# Patient Record
Sex: Female | Born: 1955 | Race: White | Hispanic: No | Marital: Single | Smoking: Never smoker
Health system: Southern US, Community
[De-identification: ages and names within clinical notes are randomized; demographics above are authoritative.]

## PROBLEM LIST (undated history)

## (undated) DIAGNOSIS — J45909 Unspecified asthma, uncomplicated: Secondary | ICD-10-CM

## (undated) DIAGNOSIS — C9590 Leukemia, unspecified not having achieved remission: Secondary | ICD-10-CM

## (undated) DIAGNOSIS — R112 Nausea with vomiting, unspecified: Secondary | ICD-10-CM

## (undated) DIAGNOSIS — T7840XA Allergy, unspecified, initial encounter: Secondary | ICD-10-CM

## (undated) DIAGNOSIS — Z5189 Encounter for other specified aftercare: Secondary | ICD-10-CM

## (undated) DIAGNOSIS — C9200 Acute myeloblastic leukemia, not having achieved remission: Secondary | ICD-10-CM

## (undated) DIAGNOSIS — T8859XA Other complications of anesthesia, initial encounter: Secondary | ICD-10-CM

## (undated) DIAGNOSIS — Z9889 Other specified postprocedural states: Secondary | ICD-10-CM

## (undated) DIAGNOSIS — C801 Malignant (primary) neoplasm, unspecified: Secondary | ICD-10-CM

## (undated) HISTORY — PX: HAND SURGERY: SHX662

## (undated) HISTORY — DX: Encounter for other specified aftercare: Z51.89

## (undated) HISTORY — DX: Malignant (primary) neoplasm, unspecified: C80.1

## (undated) HISTORY — DX: Unspecified asthma, uncomplicated: J45.909

## (undated) HISTORY — DX: Allergy, unspecified, initial encounter: T78.40XA

## (undated) HISTORY — DX: Acute myeloblastic leukemia, not having achieved remission: C92.00

## (undated) HISTORY — PX: OTHER SURGICAL HISTORY: SHX169

---

## 2001-09-24 ENCOUNTER — Emergency Department: Admit: 2001-09-24 | Payer: Self-pay | Source: Emergency Department | Admitting: Emergency Medicine

## 2004-01-17 ENCOUNTER — Ambulatory Visit
Admit: 2004-01-17 | Disposition: A | Discharge status: Home / Self Care | Payer: Self-pay | Source: Ambulatory Visit | Admitting: Obstetrics & Gynecology

## 2004-12-04 ENCOUNTER — Emergency Department: Admit: 2004-12-04 | Payer: Self-pay | Source: Emergency Department | Admitting: Emergency Medicine

## 2005-01-14 HISTORY — PX: TENDON REPAIR: SHX5111

## 2005-08-28 ENCOUNTER — Ambulatory Visit: Admission: RE | Admit: 2005-08-28 | Payer: Self-pay | Source: Ambulatory Visit

## 2005-10-28 ENCOUNTER — Ambulatory Visit: Admit: 2005-10-28 | Disposition: A | Discharge status: Home / Self Care | Payer: Self-pay | Source: Ambulatory Visit

## 2006-03-31 ENCOUNTER — Emergency Department: Admit: 2006-03-31 | Payer: Self-pay | Source: Emergency Department | Admitting: Emergency Medicine

## 2006-05-22 ENCOUNTER — Ambulatory Visit
Admit: 2006-05-22 | Disposition: A | Discharge status: Home / Self Care | Payer: Self-pay | Source: Ambulatory Visit | Admitting: Orthopaedic Surgery

## 2006-07-01 ENCOUNTER — Ambulatory Visit
Admit: 2006-07-01 | Disposition: A | Discharge status: Home / Self Care | Payer: Self-pay | Source: Ambulatory Visit | Admitting: Specialist

## 2006-07-01 LAB — CBC- CERNER
Hematocrit: 36.3 % — ABNORMAL LOW (ref 37.0–47.0)
Hgb: 12.2 G/DL (ref 12.0–16.0)
MCH: 30.8 PG (ref 28.0–32.0)
MCHC: 33.5 G/DL (ref 32.0–36.0)
MCV: 91.8 FL (ref 80.0–100.0)
MPV: 10 FL (ref 7.4–10.4)
Platelets: 163 /mm3 (ref 140–400)
RBC: 3.96 /mm3 — ABNORMAL LOW (ref 4.20–5.40)
RDW: 12.9 % (ref 11.5–15.0)
WBC: 4.9 /mm3 (ref 3.5–10.8)

## 2006-07-01 LAB — HEPATITIS B SURFACE ANTIGEN W/ REFLEX TO CONFIRMATION: Hepatitis B Surface Antigen: NEGATIVE

## 2006-07-01 LAB — URIC ACID: Uric acid: 3.4 mg/dL (ref 2.4–5.7)

## 2006-07-01 LAB — SEDIMENTATION RATE: Sed Rate: 10 MM/HR (ref 0–20)

## 2006-07-01 LAB — HEPATITIS B SURFACE ANTIBODY: HBSAB QN: 0 mlU/mL — AB

## 2006-07-01 LAB — HEMOLYSIS INDEX: Hemolysis Index: 1 Units

## 2006-07-01 LAB — CREATININE, SERUM: Creatinine: 0.8 mg/dL (ref 0.4–1.1)

## 2006-07-01 LAB — LIVER FUNCTION PANEL - AH CERNER
ALT: 24 U/L (ref 0–31)
AST (SGOT): 22 U/L (ref 0–31)
Albumin/Globulin Ratio: 2 (ref 1.1–2.2)
Albumin: 4.6 g/dL (ref 3.4–4.8)
Alkaline Phosphatase: 86 U/L (ref 35–104)
Bilirubin Direct: 0.1 mg/dL (ref 0.0–0.3)
Bilirubin Indirect: 0.1 mg/dL (ref 0.0–0.7)
Bilirubin, Total: 0.2 mg/dL (ref 0.0–1.0)
Globulin: 2.3 g/dL (ref 2.0–3.6)
Protein, Total: 6.9 g/dL (ref 6.4–8.3)

## 2006-07-01 LAB — GFR

## 2006-07-01 LAB — HEPATITIS C ANTIBODY: Hepatitis C, AB: NEGATIVE

## 2006-07-02 LAB — CYCLIC CITRULLINATED PEPTIDE AB (SOFT)

## 2006-07-02 LAB — HEPATITIS B CORE ANTIBODY, TOTAL

## 2006-07-02 LAB — LYME DISEASE SEROLOGY, S-SOFT

## 2006-07-03 LAB — ANA SCREEN ONLY: ANA Qualitative: NEGATIVE U/ml

## 2007-05-28 ENCOUNTER — Ambulatory Visit
Admit: 2007-05-28 | Disposition: A | Discharge status: Home / Self Care | Payer: Self-pay | Source: Ambulatory Visit | Admitting: Specialist

## 2007-05-29 LAB — ANA SCREEN ONLY: ANA Qualitative: NEGATIVE U/ml

## 2007-05-30 LAB — CYCLIC CITRULLINATED PEPTIDE AB (SOFT)

## 2010-01-25 ENCOUNTER — Emergency Department: Admit: 2010-01-25 | Payer: Self-pay | Source: Emergency Department | Admitting: Emergency Medicine

## 2010-11-01 NOTE — Op Note (Unsigned)
[name], [name]      MEDICAL RECORD NUMBER:         [id number]      PATIENT LOCATION/SERVICE:       [id number]            DATE OF SURGERY:               08/28/2005      SURGEON:                       Meribeth Mattes, MD      ASSISTANT 1:            PREOPERATIVE DIAGNOSES:      1.    Extensor tenosynovitis/tenosynovial cyst dorsum hand left.      2.    Pain in hand.            POSTOPERATIVE DIAGNOSES:      1.    Tenosynovitis and tenosynovial cyst.      2.    Rupture of the common extensor tendon to the index finger dorsum hand      left.            ASSISTANT:  Derrick Ravel, M.D.            PROCEDURE PERFORMED:      1.    Extensor tenosynovectomy, radical, dorsum hand left.      2.    Tenorrhaphy index common extensor hand left with Pulvertaft weave of      the third common extensor.            ANESTHESIA:  General.            COMPLICATIONS:  None.  Tolerated well.            DRAINS:  None.            DESCRIPTION OF PROCEDURE:  On 08/28/05 after appropriate informed consent,      the patient was taken from the anesthesia holding area to the operating      room where she underwent satisfactory induction of general  LMA anesthesia.      Following this, the left upper extremity was prepped and draped in a      sterile manner.  The left upper extremity was exsanguinated with an Esmarch      wrap and the tourniquet elevated to 250 mm/Hg.            An initial incision was made midline dorsal and midline over the extent of      the mass which was 2 cm in width and extending to the fourth extensor      compartment and two-thirds of the way to the metacarpal head.  This was      taken down through skin and subcutaneous tissue to the level of the mass.      A dissection was then carried out preserving all superficial sensory      innervation as well as venous structures.  This was dissected peripherally      along its radial side and along the ulnar side, and this mass was basically      an  envelopment by this tenosynovitis of tendons of the fourth compartment      with some extension into the area of the second compartment mid dorsum of  the hand and did not involve the thumb EPL.  This tenosynovitis encircled      all tendons of the fourth compartment including the indicis proprius which      when dissected free was found to be a normal tendon.  The common extensor      tendon had been digested in half with remaining distal stump and apparent      complete retraction in the form of the tendon of the common index extensor.      The middle finger extensor was enveloped by this tenosynovial mass, and      that was dissected from that in its entirety. All of this dissection was      carried out under 4.5 loupe magnification, and all aspects of this      tenosynovitis were removed.  This was extended to the ring finger ray      tendons but did not envelop that.  There was no underlying communication to      joint. Hemostasis was obtained with electrocautery.  Copious irrigation was      carried out. It was felt that the stump of the ruptured common extensor to      the index could be treated by incorporating a tendon repair utilizing the      technique of Pulvertaft and braiding that tendon through the substance of      the middle finger common extensor tendon.  So this was done in standard      manner with three passes with all aspects of it sutured with nonabsorbable      Ethibond suture with the tip buried finally and smoothly proximally with      the last Ethibond suture.  This having been done and with copious      irrigation, the wound was closed with interrupted 4-0 Vicryl in the SQ      layer, interrupted 5-0 Nylon on the skin layer.  A sterile dressing with      Xeroform and Betadine Ointment.  The subcutaneous space was injected with      Marcaine, Duramorph, and Kenalog.  The patient had a bulky hand dressing      applied with volar splint maintaining the MP joints in extension.  The       tourniquet was released and circulation returned. The patient was taken      from the operating room to the recovery room in satisfactory condition      having tolerated the procedure well.                  ___________________________________     Date Signed: _______________      Meribeth Mattes, MD                  CRU:amw:SC      D:    08/28/2005      T:    08/30/2005      #:    [id number]      N:    [id number]            cc:   Meribeth Mattes, MD

## 2011-11-11 ENCOUNTER — Inpatient Hospital Stay: Payer: PRIVATE HEALTH INSURANCE | Attending: Family Medicine | Admitting: Physical Therapist

## 2011-11-11 VITALS — BP 118/66 | HR 61

## 2011-11-11 DIAGNOSIS — M2240 Chondromalacia patellae, unspecified knee: Secondary | ICD-10-CM | POA: Insufficient documentation

## 2011-11-11 NOTE — PT/OT Exercise Plan (Signed)
Name: [name]  Referring Physician: Edwyna Shell, *  Diagnosis:   Encounter Diagnosis   Name Primary?   . Chondromalacia of patella Yes    ICD 9 Code:  717.7      Precautions:   Date of Surgery:   MD Follow-up: Late Nov          Exercise Flow Sheet    Exercise Specifics 11-11-11 Date              Bike               Incline                 Bosu                 Cybex   HS                Leg press                 TBand   [place]                 Core                                                                                     Home Exercise Program                 (Initials = supervised exercise by clinician)

## 2011-11-11 NOTE — PT/OT Therapy Note (Signed)
DAILY NOTE    Time In/Out: 1707 - 1808 Total Time: 60  Visit Number:  1/8    # of Authorized Visits:   Visit #:      Diagnosis (Treating/Medical): The encounter diagnosis was Chondromalacia of patella.      Subjective:  Kitty's pain is Intermittent and is rated a 0/10., at present  Functional Changes: has not changed any function in the past month.  She goes to gym 2-3 x per wk    Objective:   Treatment:  Therapeutic Exercise:Per flowsheet to improve: Flexibility/ROM, Stabilization and Strength    Warm-up   Modifications/Patient Education: eval today (Verbal cues )    Functional Activities: lunges for gym    INITIAL EVALUATION (Knee)      Name: [name] Age: 56 y.o. Occupation: Airline pilot, drives, carries carpet samples SOC: 11/11/2011   Referring Physician: Darnelle Bos  MD recheck: 3 weeks  DOS:  DOI: Onset of Problem / Injury: 03/24/11  Diagnosis (Treating/Medical): The encounter diagnosis was Chondromalacia of patella.   # of Authorized Visits:   Visit #   today     SUBJECTIVE:    Mechanism of Injury: Mechanism of Injury/Exacerbation: Insidious Onset    Pt has had problem on and off for 3 years.  Comes and goes.  Not specifically related to activity, but had notice that she was stiff/tight feeling after driving a lot.  Started on the L.    Patient's reason for seeking PT /: Patient Goal: learn what to do to control this and determine why it is there.    Past Medical History: No past medical history on file.  Medications: No outpatient prescriptions have been marked as taking for the 11/11/11 encounter (Office Visit) with Janace Hoard, PT.   Fall Risks: Low    Other Treatment/Prior Therapy: Yes had treatment on L lower leg and foot at a chiropractors office, with some benefit.  Dr. Tanda Rockers did " with no OA"  Prior Hospitalization: Prior Hospitalization From / To: Hx of R ankle fx 2008; L ankle fx 2011; L hand tendon rupture 2006  Hand Dominance: Dominant Hand: Right Involved Side: Involved Side:  Bilateral Tests:          Outcome Measure:      LE Functional Score: 65  % PSFS Score: 0 %    /10  PLOF: Prior level of function: Ambulated/ Performed ADL's independently  Living Environment:   # of Steps: 2 flights      Dwelling Entrance:     Patient lives with: Living Arrangements: Alone    OBJECTIVE:    Vitals: BP: 118/66 mmHg Heart Rate: 61       Observation/Posture/Gait/Integumentary:  Observation of posture: Within Functional Limitations  Ambulation: comment: Pt has no limp.  In gait the L knee has a varus shift at mid stance, and the R LE tends to IR  Gait: Other: see above    Girth/Edema: None noted   Initial R  Initial L   Suprapatellar 0     Mid Patellar      Infrapatellar      10 cm. Prox.      10 cm. Dist.            (blank fields were intentionally left blank)    Integumentary: Other: NA  Palpation: No pain to palpation and Other: Pt has a slight swollen feeling to both popliteal spaces, but not painful with palp or overpressure.   Patellar Mobility: hypermobile Direction:  lateral    Range of Motion: (degrees)    Initial Right  AROM InitialRight  PROM   Right  AROM   Right PROM Knee InitialLeft AROM InitialLeft PROM   Left AROM   Left PROM   145    Flexion 145      +2   +8 Extension +2   +2   (blank fields were intentionally left blank)    Hip AROM: WFL  IR/ER  R  =40/35;  L 30/35  Ankle AROM: WFL and Other: in squatting pt has decreased excursion R vs L    Flexibility:      Comment:   Hamstrings Restricted Bilateral 80 degrees   Quadriceps Restricted Left    Piriformis WFL    ITBand Restricted Left    Iliopsoas Restricted Left    Gastroc Restricted Right           Initial   R   R LE Strength  MMT /5 Initial  L    L     Hip Flexion     5-  Hip Extension 5-    5-  Hip Abduction 5-      Hip Adduction     5  Quadriceps 5    5  Hamstrings 5      Ankle Dorsiflexion       Ankle Plantarflexion            (blank fields were intentionally left blank)    Functional Strength:   Sit to  Stand: Independent  Squat:  Full  Step-up: not tested  Heel Raises:  Bilateral:     Unilateral: R:   L:    Special Tests/Neurological Screen:     R L  R L   Lachmans (-) (+)  Trace  Apley's Test (-) (+)  trace   Anterior Drawer (-) (+) Thomas Test (-) (+)   Posterior Drawer (-) (-) Obers Test (-) (+)   Valgus Stress (-) (+) SLR (-) (-)   Varus Stress (-) (+)      McMurray's Test NT NT          Deep Tendon Reflex R L   L3-L4 Patella NT NT   S1 Achilles NT NT    NT NT     Sensation to Light touch: Intact    Treatment Initial Visit:  Evaluation: gait, knee and hip scan  Patient Education on proper exercises to address the subtle issues that were noted.  Therapeutic exercise with instruction in HEP and provided patient written and illustrated handout No  Manual   Modalities: None  Barriers to rehabilitation: None  Rehab Potential:excellent  Is patient aware of diagnosis: Yes  For Next Visit Add  biodex and 1 RM assessment.  Will need to assess balance on single limb and bilat Q/HS strength comparison.    Debroah Loop, PT, OCS, [id number]  11/11/2011    Time In/Out:  1708 - 1808 Total Treatment Time:  60      Modalities: None  Therapy Rationale: Decrease Pain, Increase Extensiblility and Other: improve strength and muscle balance of the LEs       Assessment (response to treatment):   HEP to be reassessd next time    Progress towards functional goals:     Patient requires continued skilled care to: progress program of LE muscle strengthening and stabilizing    Plan:  Continue with Plan of Care        JT Jayana Kotula, PT, OCS, [phone]  11/11/2011  Plan of Care / Updated Plan of Care IPTC Medicare Provider #: [phone]                    Patient Name: [name]  MRN: [id number]  DOI:   DOS:  SOC: 11/11/2011         Diagnosis: The encounter diagnosis was Chondromalacia of patella.  ICD-9 code: 717.7      ASSESSMENT: the patient is a 56 y.o. female who requires Physical Therapy for the following:  Impairments: tightness of the L hip  flexors and quad, slight laxity of the L knee, with hypermobility of the patella laterally vs R.  R knee atrophy of Q and HS relative to L.    Functional Limitations: none    Plan Of Care: NMR, Proprioceptive Activites, Therapeutic Exercise and Soft Tissue/Joint Mobilization as needed to the L hip.    Frequency/Duration: 1 times a week for 6 weeks. Anticipated D/C date: 12-14-11    Goals:  Date (Body Area, Impairment Goal, Functional   Activity, Target Performance) Time Frame Status Date/  Initial   11/11/2011   Patient will demonstrate independence in prescribed HEP with proper form, sets and reps for safe discharge to an independent program.  4wks   Initial Eval    11/11/2011   Increase quadriceps strength 5 for safe ascending stairs/descending stairs reciprocally.  4 wks    Initial Eval    11/11/2011   Improve LEFS to 75  4 wks  Initial Eval    11/11/2011           11/11/2011             Signature: Debroah Loop, PT, OCS, [id number] Date: 11/11/2011    Signature: Edwyna Shell, * ____________________________ Date:

## 2011-11-13 ENCOUNTER — Inpatient Hospital Stay: Payer: PRIVATE HEALTH INSURANCE | Attending: Family Medicine | Admitting: Physical Therapist

## 2011-11-13 DIAGNOSIS — M2240 Chondromalacia patellae, unspecified knee: Secondary | ICD-10-CM | POA: Insufficient documentation

## 2011-11-13 NOTE — PT/OT Exercise Plan (Signed)
Name: [name]  Referring Physician: Edwyna Shell, *  Diagnosis:   Encounter Diagnosis   Name Primary?   . Chondromalacia of patella Yes    ICD 9 Code:  717.7      Precautions:   Date of Surgery:   MD Follow-up: Late Nov          Exercise Flow Sheet    Exercise Specifics 11-11-11 Date              Bike  Upright 5  L6  ts             Incline    stretch             Bosu    Bal and squat  30 xts             Cybex   HS  1RM 50  Q 1RM 35 40#  bil x30  ts               Leg press  90#  x20  ts               TBand   [place]   Le slightly  ER'd RTB  30 x  ts             Core    On Ball march                 Prone hip flex stretch  ts                 Wall squats w/ add ball  30 ts                 Lat stepping  RTB 2x20'  ts                 Vectors  3x5x5  ts             Home Exercise Program                 (Initials = supervised exercise by clinician)

## 2011-11-13 NOTE — PT/OT Therapy Note (Signed)
DAILY NOTE    Time In/Out: 1700 - 1755 Total Time: 50 Visit Number:  2/8    # of Authorized Visits:   Visit #:      Diagnosis (Treating/Medical): The encounter diagnosis was Chondromalacia of patella.        Subjective:  [name]'s pain is Rarely and is rated a 1/10.  Functional Changes: notes slight knee soreness from new stretch.    Objective:  Noting that the L patella track more laterally.  L infra patellar tissues are boggy, but not painful.  L hip flexors slightly tighter than R.   R hip adducts and Ir during SLS    Treatment: EX    Therapeutic Exercise:Per flowsheet to improve: Stabilization and Strength   Assisted Warm-up upright bike  Modifications/Patient Education: 1st gym ex.  To include balance and VMO work Psychiatrist, Environmental manager cues for VMO and foot -hip alignment)    Functional Activities: balance for improved gait and ex form    Manual Therapy:   Manual stretch with C/R for hip flexors R,       Current Measurements (ROM, Strength, Girth, Outcomes, etc.):   Strength   MMT of hip abd R 4+; L 4-  1RM HS  R 4  L 5  1RM Quad R 3.5; L 3.5+        Modalities: None  Therapy Rationale:   Other:        Assessment (response to treatment):     Pt tolerated ex well overall.  Was challenged by core balance ex, and SLS vector balance, especially on the R leg.  Suspect her development of the L quad is habitual, but may be related to overworking that side as well.    Progress towards functional goals: functionally doing okay, but has problems at times with knee pain during walking distances    Patient requires continued skilled care to: educate and improve LE strength and balance control/proprioception    Plan:  Continue with Plan of Care        Freedom, PT, OCS, [id number]  11/13/2011

## 2011-11-27 ENCOUNTER — Inpatient Hospital Stay: Payer: PRIVATE HEALTH INSURANCE | Attending: Family Medicine | Admitting: Physical Therapist

## 2011-11-27 DIAGNOSIS — M2240 Chondromalacia patellae, unspecified knee: Secondary | ICD-10-CM | POA: Insufficient documentation

## 2011-11-27 NOTE — PT/OT Exercise Plan (Signed)
Name: [name]  Referring Physician: Edwyna Shell, *  Diagnosis:   Encounter Diagnosis   Name Primary?   . Chondromalacia of patella Yes    ICD 9 Code:  717.7      Precautions:   Date of Surgery:   MD Follow-up: Late Nov          Exercise Flow Sheet    Exercise Specifics 11-11-11 11-27-11              Bike  Upright 5  L6  ts Rec  L6 x 9  PR to 110            Incline    stretch             Bosu    Bal and squat  30 xts Bosu squat w RTBx 20ts            Cybex   HS  1RM 50  Q 1RM 35 40#  bil x30  ts 60# x 20  ts              Leg press  90#  x20  ts 90# x 30 plus 45# x 20 R ts              TBand   [place]   Le slightly  ER'd RTB  30 x  ts RTB  FW  YTB abd/ER ts            Core    On Ball march                 Prone hip flex stretch  ts SL hip  stretch                Wall squats w/ add ball  30 ts Wall + adduct ball  30                Lat stepping  RTB 2x20'  ts Lat 4x20  BTB  ts                Vectors  3x5x5  ts vect  5-5-3  ts            Home Exercise Program                 (Initials = supervised exercise by clinician)

## 2011-11-27 NOTE — PT/OT Therapy Note (Signed)
DAILY NOTE    Time In/Out: 1215 -115 Total Time: 50 Visit Number:  3/8    # of Authorized Visits:   Visit #:      Diagnosis (Treating/Medical): The encounter diagnosis was Chondromalacia of patella.        Subjective:  Poppi's pain is rarely and is rated a 1/10.  Knees are feeling pretty good at this time. She notes that she must do the BW lunges at the end, or the knees with hurt.  Functional Changes: She is doing her normal routine at the gym.      Objective:  Noting that the L patella track more laterally.  L infra patellar tissues are boggy, but not painful.  L hip flexors slightly tighter than R.   R hip adducts and Ir during SLS, noted and advised pt to focus on this    Hip ABD MMT. 3+ Bil  but stronger in mid range  Seated Hip IR 4 L 4+ R, ER 3+ R 4 L    Treatment: EX, added TB abd with ER for hip rotators strength    Therapeutic Exercise:Per flowsheet to improve: Stabilization and Strength   Assisted Warm-up upright bike  Modifications/Patient Education: To include balance and VMO work Psychiatrist, Environmental manager cues for VMO and foot -hip alignment)    Functional Activities: balance for improved gait and ex form    Manual Therapy:   Manual stretch of hip flexors L      Current Measurements (ROM, Strength, Girth, Outcomes, etc.):   Strength   MMT of hip abd R 4+; L 4-  1RM HS  R 4  L 5  1RM Quad R 3.5; L 3.5+    Modalities: None  Therapy Rationale:   Other:     Assessment (response to treatment):     Pt tolerated ex well overall.  Still tends to adduct on the R in CKC ex.  Balance vectors more difficult on the RWB, but more pain in L knee.  Suspect her development of the L quad is habitual, but may be related to overworking that side as well.    Progress towards functional goals: functionally doing okay, but has problems at times with knee pain during walking distances    Patient requires continued skilled care to: educate and improve LE strength and balance control/proprioception    Plan:  Continue with  Plan of Care Advised in Knee Pain book.        Debroah Loop, PT, OCS, [id number]  11/27/2011

## 2011-12-02 ENCOUNTER — Inpatient Hospital Stay: Payer: PRIVATE HEALTH INSURANCE | Attending: Family Medicine | Admitting: Physical Therapist

## 2011-12-02 DIAGNOSIS — M2240 Chondromalacia patellae, unspecified knee: Secondary | ICD-10-CM | POA: Insufficient documentation

## 2011-12-02 NOTE — PT/OT Therapy Note (Signed)
DAILY NOTE    Time In/Out: 1516 -1615 Total Time: 50 Visit Number:  4/8    # of Authorized Visits:   Visit #:      Diagnosis (Treating/Medical): The encounter diagnosis was Chondromalacia of patella.        Subjective:  [name]'s pain is rarely and is rated a 1/10. She did a lot of walking in [place] this weekend and the knees felt fine.  She notes that she was not sore after the last session.     Pt notes that she has sciatic pain at times on the L.  Not lately.  Functional Changes: She is doing her normal routine at the gym.      Objective:  Noting that the L patella tracks more laterally.  L infra patellar tissues are boggy, but not painful.   L hip flexors slightly tighter than R.   RLE adducts and Ir during SLS, noted and advised pt to focus on this    Hip ABD MMT. 3+ Bil  but stronger in mid range  Seated Hip IR 4 L 4+ R, ER 3+ R 4 L    Treatment: EX, worked on hip abd with ER in SL position today, as she struggles with doing it properly standing.      Therapeutic Exercise:Per flowsheet to improve: Stabilization and Strength of the core and pelvis to reduce strain on knees for ease of getting out of car.  Assisted Warm-up upright bike  Modifications/Patient Education: To include balance and VMO work Psychiatrist, Environmental manager cues for VMO and foot -hip alignment)    Functional Activities: balance for improved gait and ex form    Manual Therapy:   Manual stretch of hip flexors L      Current Measurements (ROM, Strength, Girth, Outcomes, etc.):  SLR - bil, slightly tighter on the L.     Strength   MMT of hip abd R 4+; L 4-  1RM HS  R 4  L 5  1RM Quad R 3.5; L 3.5+    Modalities: None  Therapy Rationale:   Other:     Assessment (response to treatment):     Pt tolerated ex well overall.  Still tends to adduct on the R in CKC ex.  Balance vectors were reported to be easier on the [facility] today, but pt seems to struggle bilat.    Discussed doing more stabilization work to help her occasional LBP and sciatic sx.  R hip  tired easily on the SL hip abd ex.  Tends to use the TFL vs Gl med.    Progress towards functional goals: functionally doing okay, but has problems at times with knee pain during walking distances, and sometimes getting up from her car seat.    Patient requires continued skilled care to: educate and improve LE strength and balance control/proprioception    Plan:  Continue with Plan of Care Advised in Knee Pain book.        Debroah Loop, PT, OCS, [id number]  12/02/2011

## 2011-12-02 NOTE — PT/OT Exercise Plan (Signed)
Name: [name]  Referring Physician: Edwyna Shell, *  Diagnosis:   Encounter Diagnosis   Name Primary?   . Chondromalacia of patella Yes    ICD 9 Code:  717.7      Precautions:   Date of Surgery:   MD Follow-up: Late Nov          Exercise Flow Sheet    Exercise Specifics 11-11-11 11-27-11 12-02-11             Bike Seat 7 Upright 5  L6  ts Rec  L6 x 9  PR to 110 L 6 x 10  ts           Incline    stretch  Bridge/  march           Bosu    Bal and squat  30 xts Bosu squat w RTBx 20ts bosu + rtb abd, x25 ts           Cybex   HS  1RM 50  Q 1RM 35 40#  bil x30  ts 60# x 20  ts 60 x30  ts             Leg press  90#  x20  ts 90# x 30 plus 45# x 20 R ts dynadisc  Squats  x20             TBand   [place]   Le slightly  ER'd RTB  30 x  ts RTB  FW  YTB abd/ER ts GTB w  ER of LE  X 20           Core    On Ball march  Ball march  ts               Prone hip flex stretch  ts SL hip  stretch Include with Q stretch  ts               Wall squats w/ add ball  30 ts Wall + adduct ball  30 Wall + ball + 4#  x25  ts               Lat stepping  RTB 2x20'  ts Lat 4x20  BTB  ts Lat stepup  [place]  x20  ts               Vectors  3x5x5  ts vect  5-5-3  ts 5-5-3  ts           Home Exercise Program      LSL hip abd x15 R ts           (Initials = supervised exercise by clinician)

## 2011-12-10 ENCOUNTER — Ambulatory Visit: Payer: PRIVATE HEALTH INSURANCE | Admitting: Physical Therapist

## 2011-12-24 ENCOUNTER — Inpatient Hospital Stay: Payer: PRIVATE HEALTH INSURANCE | Attending: Orthopaedic Surgery | Admitting: Physical Therapist

## 2011-12-24 DIAGNOSIS — M2240 Chondromalacia patellae, unspecified knee: Secondary | ICD-10-CM | POA: Insufficient documentation

## 2011-12-24 NOTE — PT/OT Exercise Plan (Signed)
Name: [name]  Referring Physician: Scharlene Gloss, MD  Diagnosis:   Encounter Diagnosis   Name Primary?   . Chondromalacia of patella Yes    ICD 9 Code:  717.7      Precautions:   Date of Surgery:   MD Follow-up: Late Nov          Exercise Flow Sheet    Exercise Specifics 11-11-11 11-27-11 12-02-11 12-24-11            Bike Seat 7 Upright 5  L6  ts Rec  L6 x 9  PR to 110 L 6 x 10  ts           Incline    stretch  Bridge/  march           Bosu    Bal and squat  30 xts Bosu squat w RTBx 20ts bosu + rtb abd, x25 ts           Cybex   HS  1RM 50  Q 1RM 35 40#  bil x30  ts 60# x 20  ts 60 x30  ts Isometric Quads x 10 ea ts            Leg press  90#  x20  ts 90# x 30 plus 45# x 20 R ts dynadisc  Squats  x20 SAQ 10 # x 20 ea  ts            TBand   [place]   Le slightly  ER'd RTB  30 x  ts RTB  FW  YTB abd/ER ts GTB w  ER of LE  X 20           Core    On Ball march  Ball march  ts               Prone hip flex stretch  ts SL hip  stretch Include with Q stretch  ts Hip stretches 5 min              Wall squats w/ add ball  30 ts Wall + adduct ball  30 Wall + ball + 4#  x25  ts               Lat stepping  RTB 2x20'  ts Lat 4x20  BTB  ts Lat stepup  [place]  x20  ts Lat x 20              Vectors  3x5x5  ts vect  5-5-3  ts 5-5-3  ts           Home Exercise Program      LSL hip abd x15 R ts Hip abd x 20  ts          (Initials = supervised exercise by clinician)

## 2011-12-24 NOTE — PT/OT Therapy Note (Signed)
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        Subjective:  Khia's pain is rarely and is rated a 1/10.   She had a few days a wk ago that bothered her, not sure why.  She didn't go to the gym as regularly.  She has some c/o L sciatic sx today.      Functional Changes: She is doing her normal routine at the gym.      Objective:  Noting that the L patella tracks more laterally.  L infra patellar tissues are boggy, but not painful.   L hip flexors slightly tighter than R.   Slight restriction of L hip ER above 90,     Hip ABD MMT. 4+ today, vs 3+ last visit.  ated Hip IR     Treatment: EX, worked on patella mob and also on hip ER ROM.  Taught pt isometrics for patella compression.      Therapeutic Exercise:Per flowsheet to improve: Stabilization and Strength of the core and pelvis to reduce strain on knees for ease of getting out of car.    Modifications/Patient Education: see above  Functional Activities: hip ROM     Manual Therapy:   Manual stretch of hip rotators prone. And STM to the glute med area.      Current Measurements (ROM, Strength, Girth, Outcomes, etc.):  - SLR     Strength   MMT of hip abd R 4+; L 4+    Modalities: None  Therapy Rationale:   Other:     Assessment (response to treatment):     Pt tolerated ex well overall.  Felt looser in the hip after MT.  Did not have +SLR, nor signs of sciatica today.  Understands the need to continue the program and not quit.  Updated HEP.  Further discussed methods to help her occasional LBP and sciatic sx.  She also has a slight LLD on the L.  Able to touch the floor with palms without difficulty.    Progress towards functional goals: functionally doing okay, but has problems at times with knee pain during walking distances, and sometimes getting up from her car seat.    Patient requires continued skilled  care to: educate and improve LE strength and balance control/proprioception    Plan:  [place] to HEP and updated ex.  Pt to call if questions.        Debroah Loop, PT, OCS, [id number]  12/24/2011

## 2012-06-25 ENCOUNTER — Inpatient Hospital Stay: Payer: PRIVATE HEALTH INSURANCE | Admitting: Internal Medicine

## 2012-06-25 ENCOUNTER — Inpatient Hospital Stay
Admission: EM | Admit: 2012-06-25 | Discharge: 2012-07-28 | DRG: 835 | Disposition: A | Discharge status: Home / Self Care | Payer: 59 | Attending: Internal Medicine | Admitting: Internal Medicine

## 2012-06-25 ENCOUNTER — Emergency Department: Payer: 59

## 2012-06-25 DIAGNOSIS — E440 Moderate protein-calorie malnutrition: Secondary | ICD-10-CM | POA: Diagnosis present

## 2012-06-25 DIAGNOSIS — J90 Pleural effusion, not elsewhere classified: Secondary | ICD-10-CM | POA: Diagnosis not present

## 2012-06-25 DIAGNOSIS — Z885 Allergy status to narcotic agent status: Secondary | ICD-10-CM

## 2012-06-25 DIAGNOSIS — D61818 Other pancytopenia: Secondary | ICD-10-CM | POA: Diagnosis present

## 2012-06-25 DIAGNOSIS — I498 Other specified cardiac arrhythmias: Secondary | ICD-10-CM | POA: Diagnosis not present

## 2012-06-25 DIAGNOSIS — K5900 Constipation, unspecified: Secondary | ICD-10-CM | POA: Diagnosis not present

## 2012-06-25 DIAGNOSIS — D709 Neutropenia, unspecified: Secondary | ICD-10-CM | POA: Diagnosis present

## 2012-06-25 DIAGNOSIS — C9200 Acute myeloblastic leukemia, not having achieved remission: Principal | ICD-10-CM | POA: Diagnosis present

## 2012-06-25 DIAGNOSIS — R5081 Fever presenting with conditions classified elsewhere: Secondary | ICD-10-CM | POA: Diagnosis present

## 2012-06-25 DIAGNOSIS — S9030XA Contusion of unspecified foot, initial encounter: Secondary | ICD-10-CM | POA: Diagnosis not present

## 2012-06-25 DIAGNOSIS — Z681 Body mass index (BMI) 19 or less, adult: Secondary | ICD-10-CM

## 2012-06-25 DIAGNOSIS — Y9210 Unspecified residential institution as the place of occurrence of the external cause: Secondary | ICD-10-CM | POA: Diagnosis not present

## 2012-06-25 DIAGNOSIS — M2240 Chondromalacia patellae, unspecified knee: Secondary | ICD-10-CM | POA: Diagnosis present

## 2012-06-25 DIAGNOSIS — L270 Generalized skin eruption due to drugs and medicaments taken internally: Secondary | ICD-10-CM | POA: Diagnosis not present

## 2012-06-25 DIAGNOSIS — F3289 Other specified depressive episodes: Secondary | ICD-10-CM | POA: Diagnosis not present

## 2012-06-25 DIAGNOSIS — J45909 Unspecified asthma, uncomplicated: Secondary | ICD-10-CM | POA: Diagnosis present

## 2012-06-25 DIAGNOSIS — W208XXA Other cause of strike by thrown, projected or falling object, initial encounter: Secondary | ICD-10-CM | POA: Diagnosis not present

## 2012-06-25 DIAGNOSIS — T368X5A Adverse effect of other systemic antibiotics, initial encounter: Secondary | ICD-10-CM | POA: Diagnosis not present

## 2012-06-25 DIAGNOSIS — R1013 Epigastric pain: Secondary | ICD-10-CM | POA: Diagnosis present

## 2012-06-25 HISTORY — DX: Unspecified asthma, uncomplicated: J45.909

## 2012-06-25 LAB — MAN DIFF ONLY
Band Neutrophils Absolute: 0.03 (ref 0.00–1.00)
Band Neutrophils: 3 %
Basophils Absolute Manual: 0 (ref 0.00–0.20)
Basophils Manual: 0 %
Eosinophils Absolute Manual: 0 (ref 0.00–0.70)
Eosinophils Manual: 0 %
Lymphocytes Absolute Manual: 0.45 — ABNORMAL LOW (ref 0.50–4.40)
Lymphocytes Manual: 53 %
Monocytes Absolute: 0.01 (ref 0.00–1.20)
Monocytes Manual: 1 %
Neutrophils Absolute Manual: 0.36 — ABNORMAL LOW (ref 1.80–8.10)
Nucleated RBC: 0 (ref 0–1)
Segmented Neutrophils: 43 %

## 2012-06-25 LAB — URINALYSIS, REFLEX TO MICROSCOPIC EXAM IF INDICATED
Bilirubin, UA: NEGATIVE
Blood, UA: NEGATIVE
Glucose, UA: NEGATIVE
Ketones UA: NEGATIVE
Leukocyte Esterase, UA: NEGATIVE
Nitrite, UA: NEGATIVE
Protein, UR: NEGATIVE
Specific Gravity UA: 1.009 (ref 1.001–1.035)
Urine pH: 6 (ref 5.0–8.0)
Urobilinogen, UA: NEGATIVE mg/dL

## 2012-06-25 LAB — COMPREHENSIVE METABOLIC PANEL
ALT: 23 U/L (ref 0–55)
AST (SGOT): 31 U/L (ref 5–34)
Albumin/Globulin Ratio: 1.1 (ref 0.9–2.2)
Albumin: 3.4 g/dL — ABNORMAL LOW (ref 3.5–5.0)
Alkaline Phosphatase: 66 U/L (ref 40–150)
Anion Gap: 10 (ref 5.0–15.0)
BUN: 11 mg/dL (ref 7.0–19.0)
Bilirubin, Total: 0.5 mg/dL (ref 0.2–1.2)
CO2: 23 (ref 22–29)
Calcium: 9.3 mg/dL (ref 8.5–10.5)
Chloride: 102 (ref 98–107)
Creatinine: 0.8 mg/dL (ref 0.6–1.0)
Globulin: 3 g/dL (ref 2.0–3.6)
Glucose: 131 mg/dL — ABNORMAL HIGH (ref 70–100)
Potassium: 4 (ref 3.5–5.1)
Protein, Total: 6.4 g/dL (ref 6.0–8.3)
Sodium: 135 — ABNORMAL LOW (ref 136–145)

## 2012-06-25 LAB — CBC AND DIFFERENTIAL
Hematocrit: 34.2 % — ABNORMAL LOW (ref 37.0–47.0)
Hgb: 11.4 g/dL — ABNORMAL LOW (ref 12.0–16.0)
MCH: 32.1 pg — ABNORMAL HIGH (ref 28.0–32.0)
MCHC: 33.3 g/dL (ref 32.0–36.0)
MCV: 96.3 fL (ref 80.0–100.0)
MPV: 9.9 fL (ref 9.4–12.3)
Platelets: 107 — ABNORMAL LOW (ref 140–400)
RBC: 3.55 — ABNORMAL LOW (ref 4.20–5.40)
RDW: 14 % (ref 12–15)
WBC: 0.84 — ABNORMAL LOW (ref 3.50–10.80)

## 2012-06-25 LAB — POCT RAPID STREP A: Rapid Strep A Screen POCT: NEGATIVE

## 2012-06-25 LAB — HEMOLYSIS INDEX: Hemolysis Index: 3 (ref 0–18)

## 2012-06-25 LAB — LACTIC ACID, PLASMA: Lactic Acid: 1.3 (ref 0.5–2.2)

## 2012-06-25 LAB — GFR: EGFR: >60

## 2012-06-25 LAB — CELL MORPHOLOGY: Cell Morphology: NORMAL

## 2012-06-25 MED ORDER — SODIUM CHLORIDE 0.9 % IV BOLUS
20.0000 mL/kg | Freq: Once | INTRAVENOUS | Status: AC
Start: 2012-06-25 — End: 2012-06-26
  Administered 2012-06-25: 1006 mL via INTRAVENOUS

## 2012-06-25 MED ORDER — LEVOFLOXACIN IN D5W 750 MG/150ML IV SOLN
750.0000 mg | Freq: Once | INTRAVENOUS | Status: AC
Start: 2012-06-25 — End: 2012-06-26
  Administered 2012-06-25: 750 mg via INTRAVENOUS
  Filled 2012-06-25: qty 150

## 2012-06-25 MED ORDER — KETOROLAC TROMETHAMINE 30 MG/ML IJ SOLN
30.0000 mg | Freq: Once | INTRAMUSCULAR | Status: AC
Start: 2012-06-25 — End: 2012-06-25
  Administered 2012-06-25: 30 mg via INTRAVENOUS
  Filled 2012-06-25: qty 1

## 2012-06-25 NOTE — ED Notes (Signed)
Bed:CR4<BR> Expected date:<BR> Expected time:<BR> Means of arrival:<BR> Comments:<BR>

## 2012-06-25 NOTE — ED Notes (Signed)
Pt c/o fever, chills, general malaise since Monday.  Denies N/V/D.  Highest temp at home over 103.  Last dose antipyretic yesterday.

## 2012-06-25 NOTE — ED Provider Notes (Signed)
EMERGENCY DEPARTMENT HISTORY AND PHYSICAL EXAM     Physician/Midlevel provider first contact with patient: 06/25/12 2200         Date: 06/25/2012  Patient Name: [name]    History of Presenting Illness     Chief Complaint   Patient presents with   . Fever       History Provided By: pt    Chief Complaint: fever  Onset: 4 days PTA  Timing: intermittent  Quality: Tmax 103F  Severity: moderate  Modifying Factors:last motrin yesterday    Associated Symptoms: myalgias, HA, chills, slight cough, slight rhinorrhea, and one episode of emesis.    Additional History: [name] is a 57 y.o. female presenting to ED for eval of fever x 4 days PTA. Pt notes having fever with Tmax 103F at home with associated myalgias, HA, chills, slight cough, slight rhinorrhea, and one episode of emesis. She saw her PCP yesterday when she was dx with a stomach virus but sts sxs worsened today. Pt sts no sick contacts and per friend at bedside, pt has been acting normally but has been under increased stress recently. Pt took last antipyretic yesterday.    Pt denies diarrhea, abd pain, neck pain, photophobia, rash, ear pain, sore throat, dysuria, hematuria, back pain, SOB, bug bites, and tobacco use. Pt sts no chemotherapy agents.      PCP: No primary provider on file.      Current Facility-Administered Medications   Medication Dose Route Frequency Provider Last Rate Last Dose   . 0.9%  NaCl infusion  100 mL/hr Intravenous Continuous Cherlyn Roberts, MD 100 mL/hr at 06/26/12 0200 100 mL/hr at 06/26/12 0200   . acetaminophen (TYLENOL) tablet 650 mg  650 mg Oral Q6H PRN Cherlyn Roberts, MD       . [address] acetaminophen (TYLENOL) tablet 650 mg  650 mg Oral Once Cherlyn Roberts, MD   650 mg at 06/26/12 0032   . [COMPLETED] ketorolac (TORADOL) injection 30 mg  30 mg Intravenous Once Cherlyn Roberts, MD   30 mg at 06/25/12 2242   . [COMPLETED] levofloxacin (LEVAQUIN) 750mg  in D5W IVPB (premix)  750 mg Intravenous  Once Cherlyn Roberts, MD   750 mg at 06/25/12 2242   . ondansetron (ZOFRAN) injection 4 mg  4 mg Intravenous Q8H PRN Cherlyn Roberts, MD       . [COMPLETED] piperacillin-tazobactam (ZOSYN) 4.5 g in sodium chloride 0.9 % 100 mL IVPB mini-bag plus  4.5 g Intravenous Once Cherlyn Roberts, MD 200 mL/hr at 06/26/12 0014 4.5 g at 06/26/12 0014   . piperacillin-tazobactam (ZOSYN) 4.5 g in sodium chloride 0.9 % 100 mL IVPB mini-bag plus  4.5 g Intravenous Q8H Cherlyn Roberts, MD       . promethazine (PHENERGAN) injection 12.5 mg  12.5 mg Intravenous Q6H PRN Cherlyn Roberts, MD       . [COMPLETED] sodium chloride 0.9 % bolus 1,006 mL  20 mL/kg Intravenous Once Cherlyn Roberts, MD   1,006 mL at 06/25/12 2242       Past History     Past Medical History:  Past Medical History   Diagnosis Date   . Asthma without status asthmaticus        Past Surgical History:  Past Surgical History   Procedure Date   . Hand surgery        Family History:  History reviewed. No pertinent family history.    Social  History:  History   Substance Use Topics   . Smoking status: Never Smoker    . Smokeless tobacco: Not on file   . Alcohol Use: Yes      Comment: very rarely       Allergies:  Allergies   Allergen Reactions   . Codeine Nausea Only       Review of Systems     Constitutional: Positive for fever and chills.   Neurological: Negative for speech changes, weakness, or numbness.  Eyes: Negative for visual changes, photophobia, or eye pain.  HENT: Positive for runny nose and headache. Negative for sore throat, ear pain or neck pain.  Cardiovascular: Negative for chest pain.   Respiratory: Positive for cough. Negative for shortness of breath.   Gastrointestinal: Positive for vomiting. Negative for abdominal pain, nausea, diarrhea, or blood in stool.   Genitourinary: Negative for dysuria or hematuria.  Musculoskeletal: Positive for myalgias. Negative for gait changes and back pain.   Skin: Negative for itching or rash.    Hematological: Negative for easy bruising      Physical Exam   BP 109/55  Pulse 91  Temp 100.6 F (38.1 C)  Resp 18  Ht 1.651 m  Wt 50.349 kg  BMI 18.47 kg/m2  SpO2 97%    Physical Exam   Constitutional: Oriented to person, place, and time and well-developed, well-nourished, and in no distress.   Head: Normocephalic and atraumatic.   Mouth/Throat: Oropharynx is clear and moist.   Eyes: Conjunctivae normal and EOM are normal. Pupils are equal, round, and reactive to light.    Ears: Clear  Neck: Normal range of motion. Neck supple, no nuchal rigidity. No thyromegaly present.   Cardiovascular: Normal rate, regular rhythm, normal heart sounds and intact distal pulses.  No murmur heard.  Pulmonary/Chest: Effort normal. Diminished breath sounds L lung base with expiratory wheeze.  Abdominal: Soft. Non distended. Non tender. No rebound or guarding  Musculoskeletal: No peripheral edema. No calf swelling or tenderness.    Neurological: Patient is alert and oriented to person, place, and time. No cranial nerve deficit. Gait normal. GCS score is 15.   Skin: Skin is warm and dry. No rash. Warm to touch.   Psychiatric: Affect normal.         Diagnostic Study Results     Labs -     Results     Procedure Component Value Units Date/Time    Urine culture [[id number]] Collected:06/25/12 2221    Specimen Information:Urine / Urine, Clean Catch Updated:06/26/12 0146    Blood Culture #2 [[id number]] Collected:06/25/12 2221    Specimen Information:Blood / Blood Updated:06/26/12 0143    Narrative:    8ml required    Blood Culture #1 [[id number]] Collected:06/25/12 2241    Specimen Information:Blood / Blood Updated:06/26/12 0143    Narrative:    8ml required    CBC and differential [[id number]]  (Abnormal) Collected:06/25/12 2221    Specimen Information:Blood / Blood Updated:06/26/12 0056     WBC 0.84 (L)      RBC 3.55 (L)      Hgb 11.4 (L) g/dL      Hematocrit 62.9 (L) %      MCV 96.3 fL      MCH 32.1 (H) pg      MCHC 33.3 g/dL       RDW 14 %      Platelets 107 (L)      MPV 9.9 fL     Influenza A/B Virus Antigen [  [id number]] Collected:06/25/12 2241    Specimen Information:Nasopharyngeal / Nasal Aspirate Updated:06/25/12 2328    Narrative:    ORDER#: [id number]                                    ORDERED BY: Audley Hose  SOURCE: Nasal Aspirate                               COLLECTED:  06/25/12 22:41  ANTIBIOTICS AT COLL.:                                RECEIVED :  06/25/12 22:53  Influenza Rapid Antigen A&B                FINAL       06/25/12 23:28  06/25/12   Negative for Influenza A and B             Reference Range: Negative      Manual Differential [[id number]]  (Abnormal) Collected:06/25/12 2221     Segmented Neutrophils - 43 % Updated:06/25/12 2303     Band Neutrophils 3 %      Lymphocytes Manual 53 %      Monocytes Manual 1 %      Eosinophils Manual 0 %      Basophils Manual 0 %      Nucleated RBC 0      Abs Seg Manual 0.36 (L)      Bands Absolute 0.03      Absolute Lymph Manual 0.45 (L)      Monocytes Absolute 0.01      Absolute Eos Manual 0.00      Absolute Baso Manual 0.00     Cell MorpHology [[id number]] Collected:06/25/12 2221     Cell Morphology: Normal Updated:06/25/12 2303    Rapid Group A Strep POC [[id number]] Collected:06/25/12 2251    Specimen Information:Throat Updated:06/25/12 2251     POCT QC Pass      Rapid Strep A Screen POCT Negative       Comment        Result:     Negative Results should be confirmed by throat Cx to confirm absence of Strep A inf.    Comprehensive metabolic panel [[id number]]  (Abnormal) Collected:06/25/12 2221    Specimen Information:Blood Updated:06/25/12 2245     Glucose 131 (H) mg/dL      BUN 63.8 mg/dL      Creatinine 0.8 mg/dL      Sodium 756 (L)      Potassium 4.0      Chloride 102      CO2 23      CALCIUM 9.3 mg/dL      Protein, Total 6.4 g/dL      Albumin 3.4 (L) g/dL      AST (SGOT) 31 U/L      ALT 23 U/L      Alkaline Phosphatase 66 U/L      Bilirubin, Total 0.5 mg/dL      Globulin 3.0 g/dL       Albumin/Globulin Ratio 1.1      Anion Gap 10.0     HEMOLYZED INDEX [[id number]] Collected:06/25/12 2221     Hemolyzed Index 3 Updated:06/25/12 2245    GFR [[id number]] Collected:06/25/12 2221     EGFR >60.0 Updated:06/25/12 2245  Rapid Strep [[id number]] Collected:06/25/12 2241    Specimen Information:Throat Updated:06/25/12 2241    UA, Reflex to Microscopic [[id number]] Collected:06/25/12 2221    Specimen Information:Urine Updated:06/25/12 2237     Urine Type Clean Catch      Color, UA Yellow      Clarity, UA Clear      Specific Gravity UA 1.009      Urine pH 6.0      Leukocyte Esterase, UA Negative      Nitrite, UA Negative      Protein, UA Negative      Glucose, UA Negative      Ketones UA Negative      Urobilinogen, UA Negative mg/dL      Bilirubin, UA Negative      Blood, UA Negative     Lactic Acid [[id number]] Collected:06/25/12 2221    Specimen Information:Blood Updated:06/25/12 2225     Lactic acid 1.3           Radiologic Studies -   Radiology Results (24 Hour)     Procedure Component Value Units Date/Time    Chest 2 Views [[id number]] Collected:06/25/12 2242    Order Status:Completed  Updated:06/25/12 2247    Narrative:    CLINICAL INDICATION: Fever    COMPARISON: None.    INTERPRETATION: Frontal and lateral views of the chest obtained.  Cardiomediastinal contour within normal limits for age. Clear lungs and  sharp sulci.           Impression:     No acute cardiopulmonary process.    Mitali  Bapna, MD   06/25/2012 10:43 PM      .      Medical Decision Making   I am the first provider for this patient.    I reviewed the vital signs, available nursing notes, past medical history, past surgical history, family history and social history.    Vital Signs-Reviewed the patient's vital signs.     Patient Vitals for the past 12 hrs:   BP Temp Pulse Resp   06/26/12 0005 109/55 mmHg 100.6 F (38.1 C) 91  18    06/25/12 2138 107/56 mmHg 102.6 F (39.2 C) 98  18        Pulse Oximetry Analysis - Normal 100% on RA    Old  Medical Records: Nursing notes.     ED Course:   11:56 PM - Discussed pt case with Dr. Mylo Red, internal medicine, who will admit.  12:05 AM - discussed plan for admission, pt agrees.    Provider Notes: Non toxic appearing patient with fever and malaise. No obvious source of infection based on history and physical. Pt given IV fluids and toradol for fever control.. Blood cx and lactic acid drawn. Pt with neutropenia. No hx of being immunocompromised or taking medications that would cause neutropenia. Pt given IV zosyn. Will admit to medicine for further work up.       Diagnosis     Clinical Impression:   1. Fever    2. Neutropenia        _______________________________    Attestations:  This note is prepared by Claiborne Billings, acting as Scribe for Audley Hose, MD.    Audley Hose, MD. The scribe's documentation has been prepared under my direction and personally reviewed by me in its entirety.  I confirm that the note above accurately reflects all work, treatment, procedures, and medical decision making performed by me.    _______________________________  Cherlyn Roberts, MD  06/28/12 [phone]

## 2012-06-26 ENCOUNTER — Inpatient Hospital Stay: Payer: 59

## 2012-06-26 DIAGNOSIS — D709 Neutropenia, unspecified: Secondary | ICD-10-CM | POA: Diagnosis present

## 2012-06-26 LAB — VITAMIN B12: Vitamin B-12: 1040 pg/mL — ABNORMAL HIGH (ref 211–911)

## 2012-06-26 LAB — HEMOLYSIS INDEX: Hemolysis Index: 5 (ref 0–9)

## 2012-06-26 LAB — FOLATE: Folate: 14.5 ng/mL

## 2012-06-26 MED ORDER — NALOXONE HCL 0.4 MG/ML IJ SOLN
0.20 mg | INTRAMUSCULAR | Status: DC | PRN
Start: 2012-06-26 — End: 2012-07-28

## 2012-06-26 MED ORDER — SODIUM CHLORIDE 0.9 % IV SOLN
100.00 mL/h | INTRAVENOUS | Status: AC
Start: 2012-06-26 — End: 2012-06-27
  Administered 2012-06-26: 100 mL/h via INTRAVENOUS

## 2012-06-26 MED ORDER — SODIUM CHLORIDE 0.9 % IV MBP
4.5000 g | Freq: Once | INTRAVENOUS | Status: AC
Start: 2012-06-26 — End: 2012-06-26
  Administered 2012-06-26: 4.5 g via INTRAVENOUS
  Filled 2012-06-26: qty 100
  Filled 2012-06-26: qty 4000

## 2012-06-26 MED ORDER — ACETAMINOPHEN 325 MG PO TABS
650.0000 mg | ORAL_TABLET | Freq: Once | ORAL | Status: AC
Start: 2012-06-26 — End: 2012-06-26
  Administered 2012-06-26: 650 mg via ORAL
  Filled 2012-06-26: qty 2

## 2012-06-26 MED ORDER — SODIUM CHLORIDE 0.9 % IV MBP
4.5000 g | Freq: Three times a day (TID) | INTRAVENOUS | Status: DC
Start: 2012-06-26 — End: 2012-06-26
  Administered 2012-06-26: 4.5 g via INTRAVENOUS
  Filled 2012-06-26 (×2): qty 4000

## 2012-06-26 MED ORDER — ACETAMINOPHEN 325 MG PO TABS
650.0000 mg | ORAL_TABLET | ORAL | Status: DC | PRN
Start: 2012-06-26 — End: 2012-07-28
  Administered 2012-06-26 – 2012-07-27 (×28): 650 mg via ORAL
  Filled 2012-06-26 (×20): qty 2

## 2012-06-26 MED ORDER — PROMETHAZINE HCL 25 MG/ML IJ SOLN
12.5000 mg | Freq: Four times a day (QID) | INTRAMUSCULAR | Status: AC | PRN
Start: 2012-06-26 — End: 2012-06-27

## 2012-06-26 MED ORDER — ENOXAPARIN SODIUM 40 MG/0.4ML SC SOLN
40.0000 mg | SUBCUTANEOUS | Status: DC
Start: 2012-06-26 — End: 2012-07-06
  Administered 2012-06-26 – 2012-07-05 (×8): 40 mg via SUBCUTANEOUS
  Filled 2012-06-26 (×9): qty 0.4

## 2012-06-26 MED ORDER — ONDANSETRON HCL 4 MG/2ML IJ SOLN
4.0000 mg | Freq: Three times a day (TID) | INTRAMUSCULAR | Status: AC | PRN
Start: 2012-06-26 — End: 2012-06-27
  Administered 2012-06-26: 4 mg via INTRAVENOUS
  Filled 2012-06-26: qty 2

## 2012-06-26 MED ORDER — ACETAMINOPHEN 325 MG PO TABS
650.0000 mg | ORAL_TABLET | Freq: Four times a day (QID) | ORAL | Status: AC | PRN
Start: 2012-06-26 — End: 2012-06-27
  Filled 2012-06-26: qty 2

## 2012-06-26 MED ORDER — MORPHINE SULFATE 2 MG/ML IJ/IV SOLN (WRAP)
2.0000 mg | Status: DC | PRN
Start: 2012-06-26 — End: 2012-07-28
  Administered 2012-07-21: 2 mg via INTRAVENOUS
  Filled 2012-06-26: qty 1

## 2012-06-26 MED ORDER — LEVOFLOXACIN IN D5W 500 MG/100ML IV SOLN
500.0000 mg | INTRAVENOUS | Status: DC
Start: 2012-06-26 — End: 2012-06-30
  Administered 2012-06-26 – 2012-06-29 (×4): 500 mg via INTRAVENOUS
  Filled 2012-06-26 (×4): qty 100

## 2012-06-26 NOTE — Plan of Care (Signed)
Problem: Protective Precautions [in description add neutropenic precautions]  Goal: Free from nosocomial infections  Outcome: Progressing  Received pt in room from ED. AOx3. Denies pain. Neutropenic precautions initiated, pt aware and educated. IVF initiated. Pt oriented to room. Will continue to monitor and assess pt for any status change. Call light in reach.

## 2012-06-26 NOTE — Treatment Plan (Signed)
[facility]fectious Diseases [facility] T[facility]  Medicine  Full Consult Dictated        Full consult dictated: # [id number]    Assessment:   Fever   Pancytopenia    Likely viral syndrome        Plan:    Continue Levaquin   Repeat chest X ray (PA and lateral)   Urinary legionella and strep pneumonia antigen   If above studies are unremarkable, discontinue antibiotics and monitor clinically   Monitor CBC daily    Thanks for consultation        Melvinia Ashby A. Janalyn Rouse, MD   06/26/2012

## 2012-06-26 NOTE — Consults (Signed)
Service Date: 06/26/2012     Patient Type: I     CONSULTING PHYSICIAN: Teena Irani Chamika Cunanan MD     REFERRING PHYSICIAN: Eric Form MD     REASON FOR REFERRAL:  Leukopenia and thrombocytopenia.     HISTORY OF PRESENT ILLNESS:  [name] is a 57 year old who was in her usual state of excellent health  until approximately 5 days ago when she began to experience arthralgias,  myalgias, and some rhinorrhea.  She was noted to have temperature  elevations to as high as 102 and 103 degrees.  It was recommended that she  report to the emergency room where she was discovered to have a temperature  of 102 degrees.  A CBC at the time of admission revealed a WBC of 0.84,  hemoglobin 11.4, hematocrit 34.2 and a platelet count of 107.  Her absolute  neutrophil count was 0.39.  There were no blasts noted.  The manual  differential included 43 segmented neutrophils, 3 bands, 53 lymphocytes,  and 1 monocyte.  She was placed on Levaquin and has defervesced.  She  reports that she feels much better this afternoon.  She was referred for  further evaluation and treatment recommendations.     REVIEW OF SYSTEMS:  CONSTITUTIONAL:  She denies any headache or weight loss.  RESPIRATORY:  No cough, shortness of breath, or wheezing.  She exercises  regularly.  CARDIOVASCULAR:  No chest pain or palpitations.  GASTROINTESTINAL:  No nausea, vomiting, diarrhea, or constipation.  NEUROLOGIC:  No change in strength or sensation.  Extensive review of 12  systems is otherwise negative.     PAST MEDICAL HISTORY:  Asthma intermittently.     FAMILY HISTORY:  Noncontributory.     CURRENT MEDICATIONS:  None.  She takes the following supplements:  Fish oil, vitamin D, vitamin  K, magnesium, and Biotin.     FAMILY HISTORY:  Her father has been evaluated for Alzheimer disease.     ALLERGIES:  CODEINE.     PHYSICAL EXAMINATION:  GENERAL:  Well-developed, well-nourished female in no acute distress,  oriented x3.  HEENT:  Nonicteric sclerae, EOMI.  NECK:  Without  mass, adenopathy, or thyromegaly.  CHEST:  Clear to auscultation.  CARDIOVASCULAR:  RRR without MRG.  ABDOMEN:  Benign without hepatosplenomegaly.  EXTREMITIES:  Without clubbing, cyanosis, or edema.  LYMPHATIC SYSTEM:  No cervical, supraclavicular, axillary, inguinal or  femoral adenopathy.     LABORATORY DATA:  Liver function tests within normal limits.  Urinalysis unremarkable.  A  rapid influenza A, B virus antigen testing negative.     IMPRESSION:  1.  Febrile illness associated with neutropenia.  2.  Thrombocytopenia.     DISCUSSION AND RECOMMENDATIONS:  No change will be made in her current medications.  She has no antecedent  history to suggest an underlying lymphoproliferative disorder or bone  marrow dyscrasia.  A vitamin B level is pending.  I would not recommend any  other tests at present.  It is certainly possible that she is recovering  from a viral process which has led to mild pancytopenia.  Serial CBCs will  be performed.  She understood all points of discussion.  Certainly, if  there is progressive pancytopenia, bone marrow biopsy and aspirate as well  as other investigatory steps will be taken.     Thank you for allowing me to participate in the care of this lovely  individual.           D:  06/26/2012  19:17 PM by Dr. Teena Irani. Coran Dipaola, MD [phone])  T:  06/26/2012 19:43 PM by       Everlean Cherry: [id number]) (Doc ID: [id number])

## 2012-06-26 NOTE — Treatment Plan (Signed)
VTE/PE Risk Screening  Complete Upon Admission and Transfer to Different Level of Care  Completed by nurse: Lyanne Co 06/26/2012 2:24 AM   -----------------------------------------------------------------------------------------------------------  SECTION 1 - Risk Screening     []   Patient currently receiving anticoagulation therapy (Heparin, Lovenox, Coumadin, Pradaxa, Xarelto, or Arixtra Only) and received 1 dose within 24 hours of admission STOP HERE   []   VTE/PE prophylaxis currently prescribed elsewhere - STOP HERE   []   Comfort Care - STOP HERE   []   Clinical Trials - STOP HERE    Contraindications: Patients with a history of the following conditions cannot haveSequential compression devices (SCD     []  Any of these conditions present , Call MD for pharmacological prophylaxis or ask MD to document reason for not having both mechanical and pharmacologic VTE prophylaxis   []  Post-op vein ligation   []  Suspected VTE   []  Cellulitis/Dermatitis of the leg   []  Severe ischemic Vascular disease   []  Edema related to Congestive Heart Faliure   []  Gangrene   []  Recent skin graft  -----------------------------------------------------------------------------------------------------------  SECTION 2 - Risk Factors (Check all that apply)    Moderate Risk Factors   []   Heart Failure (current or history of)   []   Respiratory Failure   []   Acute Myocardial Infarction (AMI)   []   Acute Infection   []   Rheumatologic Disorder   []   Elderly age (57 years old)   []   Ongoing hormonal treatment / estrogen use (including Tamoxifen, Raloxifene)   []   Obesity (BMI >/= 30kg/m2)    High Risk Factors   []   Recent (</= 1 month) trauma/surgery    Highest Risk Factors   []   Active Cancer   []   Previous VTE   []   Reduced mobility (>24 hrs; current or anticipated)   []   Known thrombophilic condition (hematological disorders that promote thrombosis)      [x]   No boxes checked in this section indicate patient is at low risk for VTE. No VTE  Prophylaxis indicated.      []   One or more risk factors presentcall MD or NP to order Sequential compression devices (SCD),.

## 2012-06-26 NOTE — Plan of Care (Signed)
Problem: Hemodynamic Status: Immune Suppressed-Hematologic  Goal: Stable vital signs and fluid balance  Outcome: Progressing  A&Ox3; vitals stable; slight headache resolved with Tylenol 650 mg; neutropenic precautions maintained; appetite fair, no nausea or vomiting; Plan: IVF, IV antibiotics;

## 2012-06-26 NOTE — Progress Notes (Signed)
Patient seen.  Consult dictated.  Recommend observation with continuation of current treatment.

## 2012-06-26 NOTE — Consults (Signed)
Service Date: 06/26/2012     Patient Type: I     CONSULTING PHYSICIAN: [place] Crane MD     REFERRING PHYSICIAN: Eric Form MD     HISTORY OF PRESENT ILLNESS:  [name] is a 57 year old Caucasian female with a history of asthma, and no  significant medical history, is admitted because of 5 days of fevers,  myalgias, slight cough, headaches, and rhinorrhea.  The patient also had  some vomiting before coming to the hospital, but no diarrhea.  She was seen  by the primary care physician and was told that she has a viral syndrome.   The patient came to the emergency room and had a chest x-ray done, which is  unremarkable, but her CBC showed a significant amount of neutropenia and  pancytopenia, along with a temperature of 102.6.  The patient is admitted  and is currently on Levaquin and also received a dose of Zosyn.  The blood  cultures and urine cultures have been ordered and are pending.  Influenza  screen is negative.     PAST MEDICAL HISTORY:  Consistent with asthma.     PAST SURGICAL HISTORY:  Hand surgery.     FAMILY HISTORY:  Noncontributory.     SOCIAL HISTORY:  No smoking, drinks alcohol socially.     ALLERGIES:  CODEINE.     REVIEW OF SYSTEMS:  The patient had fevers and chills, myalgias, generalized weakness, had some  rhinorrhea and sore throat and mild dry cough.  No seizure or syncopal  episode.  No visual symptoms.  No chest pains, no shortness of breath, no  abdominal pain.  Has some nausea and vomiting, but no diarrhea.  No urinary  symptoms.  No skin rashes, joint pains, or swelling.     PHYSICAL EXAMINATION:  GENERAL:  The patient is awake and alert, in no apparent distress.  VITAL SIGNS:  Temperature 98.8, blood pressure 125/67, pulse is 83,  respiratory rate 18.  HEENT:  Head is normocephalic and atraumatic.  Pupils are reactive to  light.  Sclerae is anicteric.  Oral mucosa is normal.  NECK:  Supple.  There is no neck lymphadenopathy, no thyromegaly.  LUNGS:  She has a few scattered  crackles in both lungs.  No wheezing, no  rhonchi.  HEART:  Regular rate and rhythm without any murmur.  ABDOMEN:  Soft, nontender.  There is no organomegaly.  EXTREMITIES:  No edema, no clubbing, and no cyanosis.  NEUROLOGIC:  Grossly intact.     LABORATORY DATA:  Her white cell count is 0.8; hemoglobin 11.4, hematocrit 34.2; platelet  count 107,000, segmented neutrophils are 43%, bands 3%.  Sodium 135,  potassium 4.0, chloride 102, bicarbonate 23, BUN 11, creatinine 0.8,  alkaline phosphatase 66.  ALT 23, AST 31.  Lactic acid is 1.3.  UA is  unremarkable.     DIAGNOSTIC STUDIES:  Chest x-ray shows no active disease.     ASSESSMENT:  1.  Fever.  2.  Pancytopenia.  3.  Likely a viral syndrome.     PLAN:  At this time, continue Levaquin.  Repeat chest x-ray, PA and lateral view.   Also obtain urinary Legionella and Streptococcus pneumonia antigen, and if  those studies are unremarkable, we will discontinue antibiotics and monitor  daily CBC.  Monitor patient clinically.     Thank you for the consultation.           D:  06/26/2012 13:26 PM by Dr. Nevada Crane, MD [phone])  T:  06/26/2012 14:46 PM by       Everlean Cherry: [id number]) (Doc ID: [id number])

## 2012-06-26 NOTE — H&P (Signed)
Patient Type: I     ATTENDING PHYSICIAN: Tobie Lords, MD     CHIEF COMPLAINT:  Fever.       HISTORY OF PRESENT ILLNESS:  Ms. Doucette is a 57 year old white female who presented with fever for the  past 4 days.  She was doing well until 4 days ago when she started with  body aches and chills and a little bit of a stuffy nose and rhinorrhea.   She went to her primary care physician 2 days later and was told that she  probably had a viral infection and went home.  At home, she started having  fever of 103 degrees Fahrenheit, and she called her primary care physician  who told her go to the emergency room.  Here in the emergency room, patient  was found to be febrile with a temperature of 102 degrees Fahrenheit.  She  was, in addition, found to be neutropenic with a white blood cell count of  0.84 with about 50% neutrophils.  She was given broad-spectrum antibiotics  in the form of Zosyn and Levaquin, and she was admitted for further  evaluation.  On further questioning, she denied having significant cough,  denies any skin rash.  She denies any diarrhea, but she had 1 episode of  vomiting at home.  No urinary symptoms.  She was hospitalized for  neutropenic fever.     REVIEW OF SYSTEMS:  The patient never had any hematologic problems in the past.  Her last CBC,  which was normal, was I think in 2009.  She denies excessive consumption of  alcohol.  However, she has lost about 10 pounds.  The patient started a  dieting program.     REVIEW OF SYSTEMS:  A 10-point system reviewed and negative except as mentioned above.     PAST MEDICAL HISTORY:  Asthma.     FAMILY HISTORY:  Negative for hematologic disorders or any other cancer.     SOCIAL HISTORY:  She does not smoke.  She also denies use of alcohol.     ALLERGIES:  CODEINE.     HOME MEDICATIONS:  Does not take medications on a regular basis.     PHYSICAL EXAMINATION:  GENERAL:  She is a thin-built white female, not in acute distress.  VITAL SIGNS:  Blood pressure 109/55,  pulse 91, temperature 100.6,  respiration 18, saturation 97%.  Her body mass index is 18.4.  HEAD:  Normocephalic, atraumatic.  EYES:  Pupils are reactive to light.  NECK:  Supple, no thyromegaly or adenopathy.  ENT:  Oropharynx is clear.  CHEST:  Resonant and clear.  HEART:  S1, S2 regular.  No murmur or gallops.  ABDOMEN:  Soft, no hepatosplenomegaly.  SKIN:  Does not reveal any skin rash or skin lesions.  MUSCULOSKELETAL:  No clubbing, cyanosis, joint swelling, or tenderness.  PSYCHIATRIC:  Normal insight and affect.  CENTRAL NERVOUS SYSTEM:  Awake, alert, oriented x3.  Cranial nerves II  through XII are grossly intact.     LABORATORY AND DIAGNOSTIC DATA:  Chest x-ray and urinalysis negative.     White count is 0.84, hemoglobin 11.4, platelets 107.     IMPRESSION:  1.  Neutropenic fever.  2.  Pancytopenia.  3.  Weight loss with a body mass index of 18 shows mild to moderate  malnutrition.     RECOMMENDATIONS:  Broad-spectrum antibiotic, neutropenic precaution.  Await for blood  culture.  She is hemodynamically stable, so we will just keep her on  Levaquin at this point.  We will get infectious disease and hematology  consult.  We will check vitamin B12 and folic acid levels.           D:  06/26/2012 11:18 AM by Dr. Bonnielee Haff. Mylo Red, MD ([id number])  T:  06/26/2012 11:42 AM by Netta Cedars      Everlean Cherry: [id number]) (Doc ID: [id number])

## 2012-06-27 LAB — RETICULOCYTES
Immature Platelet Fraction: 4.7 % (ref 0.9–11.2)
Immature Retic Fract: 2.9 % — ABNORMAL LOW (ref 3.0–15.9)
Reticulocyte Count Absolute: 0.011 — ABNORMAL LOW (ref 0.0210–0.1350)
Reticulocyte Count Automated: 0.4 % — ABNORMAL LOW (ref 0.5–2.5)
Reticulocyte Hemoglobin: 31.2 pg (ref 28.2–36.6)

## 2012-06-27 LAB — MAN DIFF ONLY
Band Neutrophils Absolute: 0 (ref 0.00–1.00)
Band Neutrophils: 0 %
Basophils Absolute Manual: 0 (ref 0.00–0.20)
Basophils Manual: 0 %
Eosinophils Absolute Manual: 0 (ref 0.00–0.70)
Eosinophils Manual: 0 %
Lymphocytes Absolute Manual: 0.83 (ref 0.50–4.40)
Lymphocytes Manual: 69 %
Monocytes Absolute: 0 (ref 0.00–1.20)
Monocytes Manual: 0 %
Neutrophils Absolute Manual: 0.37 — ABNORMAL LOW (ref 1.80–8.10)
Nucleated RBC: 0 (ref 0–1)
Segmented Neutrophils: 31 %

## 2012-06-27 LAB — CBC AND DIFFERENTIAL
Hematocrit: 28.5 % — ABNORMAL LOW (ref 37.0–47.0)
Hgb: 9.9 g/dL — ABNORMAL LOW (ref 12.0–16.0)
MCH: 32.9 pg — ABNORMAL HIGH (ref 28.0–32.0)
MCHC: 34.7 g/dL (ref 32.0–36.0)
MCV: 94.7 fL (ref 80.0–100.0)
MPV: 10.6 fL (ref 9.4–12.3)
Platelets: 89 — ABNORMAL LOW (ref 140–400)
RBC: 3.01 — ABNORMAL LOW (ref 4.20–5.40)
RDW: 14 % (ref 12–15)
WBC: 1.2 — ABNORMAL LOW (ref 3.50–10.80)

## 2012-06-27 LAB — CELL MORPHOLOGY: Cell Morphology: NORMAL

## 2012-06-27 MED ORDER — SODIUM CHLORIDE 0.9 % IV MBP
2.0000 g | Freq: Every evening | INTRAVENOUS | Status: DC
Start: 2012-06-27 — End: 2012-06-30
  Administered 2012-06-27 – 2012-06-29 (×3): 2 g via INTRAVENOUS
  Filled 2012-06-27 (×4): qty 2000

## 2012-06-27 NOTE — Progress Notes (Signed)
Infectious Diseases & Tropical Medicine  Progress Note    06/27/2012   [name] ZOX:[id number],XBJ:[id number] is a 57 y.o. female,       Assessment:     Febrile neutropenia  Pancytopenia   Likely viral syndrome  Likely sinusitis    CXR unremarkable  Cough     Plan:      Continue Levaquin,add ceftriaxone   CT scan of sinuses and chest    Urinary legionella and strep pneumonia antigen negative    Monitor clinically   Monitor CBC daily   Oncology following-reviewed note    ROS:     General:  periodic fevers, no chills, no rigor,awake,alert   HEENT: no neck pain, no throat pain,c/o sinus congestion  Endocrine: c/o fatigue   Respiratory: c/o  cough, shortness of breath, or wheezing   Cardiovascular: no chest pain   Gastrointestinal: no abdominal pain,no N/V/D  Genito-Urinary: no dysuria, trouble voiding, or hematuria   Musculoskeletal: no edema  Neurological: c/o generalized weakness   Dermatological: no rash, no ulcer    Physical Examination:     Blood pressure 118/58, pulse 70, temperature 99.5 F (37.5 C), temperature source Oral, resp. rate 16, height 1.651 m (5\' 5" ), weight 50.349 kg (111 lb), SpO2 98.00%.     General Appearance: Comfortable, and in no acute distress. Awake,alert    HEENT: Pupils are equal, round, and reactive to light.    Lungs:  Scattered rhonchi   Heart: RRR   Chest: Symmetric chest wall expansion.    Abdomen: soft ,non tender,no hepatosplenomegaly   Neurological: No focal deficit   Extremities: No edema    Laboratory And Diagnostic Studies:     Recent Labs   [facility] 06/27/12 0510 06/25/12 2221    WBC 1.20* 0.84*    HGB 9.9* 11.4*    HCT 28.5* 34.2*    PLT 89* 107*     Recent Labs   [facility] 06/25/12 2221    NA 135*    K 4.0    CL 102    CO2 23    BUN 11.0    CREAT 0.8    GLU 131*    CA 9.3     Recent Labs   Basename 06/25/12 2221    AST 31    ALT 23    ALKPHOS 66    PROT 6.4    ALB 3.4*       Current Meds:      Scheduled Meds: PRN Meds:         enoxaparin 40 mg Subcutaneous  Q24H   levofloxacin 500 mg Intravenous Q24H       Continuous Infusions:       . [EXPIRED] sodium chloride 100 mL/hr (06/26/12 0200)         [EXPIRED] acetaminophen 650 mg Q6H PRN   acetaminophen 650 mg Q4H PRN   morphine 2 mg Q2H PRN   naloxone 0.2 mg PRN   [EXPIRED] ondansetron 4 mg Q8H PRN   [EXPIRED] promethazine 12.5 mg Q6H PRN         Dontavia Brand A. Janalyn Rouse, M.D.  06/27/2012  12:43 PM

## 2012-06-27 NOTE — Plan of Care (Signed)
Problem: Anxiety  Goal: Anxiety is a manageable level  Outcome: Progressing  Pt is AAO x 4, no c/o pain.  Febrile this shift, tylenol and ice packs provided with relief.  Pt unable to sleep, states she is unable to stop thinking about everything that is going on.  Pt refused antianxiety meds or sleep aids.  Provided quiet, dark room for pt with few distractions.  Pt was c/o nausea, zofran given with relief.  Pt sts she ate the majority of her meal and feels like her appetite is coming back.  Ambulates with steady gait.  IV abx infusing.  Will continue to monitor pt and follow plan of care

## 2012-06-27 NOTE — Progress Notes (Signed)
HEMATOLOGY ONCOLOGY PROGRESS NOTE    Date Time: 06/27/2012 1:26 PM  Patient Name: [name],[name]      IMPRESSION:     Patient Active Problem List   Diagnosis   . Chondromalacia of patella   . Neutropenic fever         PLAN: She remains pancytopenic with marked neutropenia and fever.  If there is no evidence of improvement in her neutropenia in the next 24-48 hours, a bone marrow biopsy with samples for cytogenetics and flow cytometry will be performed. Testing for parvovirus will be requested.  Antibiotics as per ID.             HISTORY: [name] is a 57 year old who was in her usual state of excellent health   until approximately 5 days ago when she began to experience arthralgias,   myalgias, and some rhinorrhea. She was noted to have temperature   elevations to as high as 102 and 103 degrees. It was recommended that she   report to the emergency room where she was discovered to have a temperature   of 102 degrees. A CBC at the time of admission revealed a WBC of 0.84,   hemoglobin 11.4, hematocrit 34.2 and a platelet count of 107. Her absolute   neutrophil count was 0.39.  Interval History:  She experienced a temperature to 103 this AM and does not feel well.  NO SOB, back or chest pain.       PHYSICAL EXAM:     Filed Vitals:    06/27/12 0802   BP: 118/58   Pulse: 70   Temp: 99.5 F (37.5 C)   Resp: 16   SpO2: 98%       Intake and Output Summary (Last 24 hours) at Date Time  No intake or output data in the 24 hours ending 06/27/12 1326    Appearance: in no acute distress  HEENT: pharynx clear  Lungs: clear to ausculation  Heart: regular rate and rhythm, normal heart sounds  Abdomen: flat and soft, active bowel sounds  Extremities: no edema  Neuro: alert, appropriate  MEDS:   Scheduled Meds:  Current Facility-Administered Medications   Medication Dose Route Frequency   . enoxaparin  40 mg Subcutaneous Q24H   . levofloxacin  500 mg Intravenous Q24H     Continuous Infusions:     . [EXPIRED] sodium chloride 100  mL/hr (06/26/12 0200)     PRN Meds:.[EXPIRED] acetaminophen, acetaminophen, morphine, naloxone, [EXPIRED] ondansetron, [EXPIRED] promethazine    LABS:     Results     Procedure Component Value Units Date/Time    Cell MorpHology [[id number]] Collected:06/27/12 0510     Cell Morphology: Normal Updated:06/27/12 0756    CBC with differential [[id number]]  (Abnormal) Collected:06/27/12 0510    Specimen Information:Blood / Blood Updated:06/27/12 0756     WBC 1.20 (L)      RBC 3.01 (L)      Hgb 9.9 (L) g/dL      Hematocrit 78.2 (L) %      MCV 94.7 fL      MCH 32.9 (H) pg      MCHC 34.7 g/dL      RDW 14 %      Platelets 89 (L)      MPV 10.6 fL     Manual Differential [[id number]]  (Abnormal) Collected:06/27/12 0510     Segmented Neutrophils - 31 % Updated:06/27/12 0756     Band Neutrophils 0 %      Lymphocytes  Manual 69 %      Monocytes Manual 0 %      Eosinophils Manual 0 %      Basophils Manual 0 %      Nucleated RBC 0      Abs Seg Manual 0.37 (L)      Bands Absolute 0.00      Absolute Lymph Manual 0.83      Monocytes Absolute 0.00      Absolute Eos Manual 0.00      Absolute Baso Manual 0.00     Blood Culture #1 [[id number]] Collected:06/25/12 2241    Specimen Information:Blood / Blood Updated:06/27/12 0221    Narrative:    ORDER#: [id number]                                    ORDERED BY: THOMAS, SASHA  SOURCE: Blood arm                                    COLLECTED:  06/25/12 22:41  ANTIBIOTICS AT COLL.:                                RECEIVED :  06/26/12 01:43  Culture Blood                              PRELIM      06/27/12 02:21  06/27/12   No Growth after 1 day/s of incubation.      Blood Culture #2 [[id number]] Collected:06/25/12 2221    Specimen Information:Blood / Blood Updated:06/27/12 0221    Narrative:    ORDER#: [id number]                                    ORDERED BY: Audley Hose  SOURCE: Blood arm                                    COLLECTED:  06/25/12 22:21  ANTIBIOTICS AT COLL.:                                 RECEIVED :  06/26/12 01:43  Culture Blood                              PRELIM      06/27/12 02:21  06/27/12   No Growth after 1 day/s of incubation.      STREP PNEUMONIA, RAPID URINARY ANTIGEN [[id number]] Collected:06/26/12 1648    Specimen Information:Urine, Clean Catch Updated:06/27/12 0105    Narrative:    ORDER#: [id number]                                    ORDERED BY: Janalyn Rouse, IMTI  SOURCE: Urine, Clean Catch                           COLLECTED:  06/26/12 16:48  ANTIBIOTICS AT COLL.:                                RECEIVED :  06/26/12 21:03  S. pneumoniae, Rapid Urinary Antigen       FINAL       06/27/12 01:05  06/27/12   Negative for Streptococcus pneumoniae Urinary Antigen             Note:             This is a presumptive test for the direct qualitative             detection of bacterial antigen. This test is not intended as             a substitute for a gram stain and bacterial culture. Samples             with extremely low levels of antigen may yield negative             results.             Reference Range: Negative      Legionella antigen, urine [[id number]] Collected:06/26/12 1648    Specimen Information:Urine / Urine, Clean Catch Updated:06/27/12 0103    Narrative:    ORDER#: [id number]                                    ORDERED BY: Janalyn Rouse, IMTI  SOURCE: Urine, Clean Catch                           COLLECTED:  06/26/12 16:48  ANTIBIOTICS AT COLL.:                                RECEIVED :  06/26/12 21:03  Legionella, Rapid Urinary Antigen          FINAL       06/27/12 01:04  06/27/12   Negative for Legionella pneumophila Serogroup 1 Antigen             Limitations of Test:             1. Negative results do not exclude infection with Legionella                pneumophila Serogroup 1.             2. Does not detect other serogroups of L. pneumophila                or other Legionella species.             Test Reference Range: Negative      Urine culture [[id number]] Collected:06/25/12 2221     Specimen Information:Urine / Urine, Clean Catch Updated:06/27/12 0002    Narrative:    ORDER#: [id number]                                    ORDERED BY: Audley Hose  SOURCE: Urine, Clean Catch                           COLLECTED:  06/25/12 22:21  ANTIBIOTICS AT COLL.:  RECEIVED :  06/26/12 01:46  Culture Urine                              FINAL       06/27/12 00:02  06/27/12   No growth of >1,000 CFU/ML, No further work      Folate [[id number]] Collected:06/26/12 1548    Specimen Information:Blood Updated:06/26/12 2035     Folate 14.5 ng/mL     Vitamin B12 [[id number]]  (Abnormal) Collected:06/26/12 1548    Specimen Information:Blood Updated:06/26/12 2029     Vitamin B-12 1040 (H) pg/mL     Hemolyzed Index [[id number]] Collected:06/26/12 1548     Hemolyzed Index 5 Updated:06/26/12 1952          IMAGING DATA:  Xr Chest 2 Views    06/26/2012   CLINICAL INDICATION: pneumonia  COMPARISON: None available  FINDINGS:   2 views of the chest were obtained. The lungs are clear. The heart and vascularity are within normal limits.  There is no pleural thickening or effusion. The osseous structures are unremarkable.      06/26/2012   No active cardiopulmonary disease   Heron Nay, MD  06/26/2012 11:12 PM     Chest 2 Views    06/25/2012  CLINICAL INDICATION: Fever  COMPARISON: None.  INTERPRETATION: Frontal and lateral views of the chest obtained. Cardiomediastinal contour within normal limits for age. Clear lungs and sharp sulci.           06/25/2012   No acute cardiopulmonary process.  Filbert Schilder, MD  06/25/2012 10:43 PM                       Larene Pickett, MD  [facility]    [phone]

## 2012-06-27 NOTE — Plan of Care (Signed)
Problem: Pain  Goal: Patient's pain/discomfort is manageable  Outcome: Progressing  Pt c/o headache, tylenol was given. Pain level decreased from 6 to 2 out of 10. No fever today, WBC improved slightly, on neutropenic isolation.  Plan: headache management, monitor temp and WBC count

## 2012-06-27 NOTE — Progress Notes (Signed)
Daily PROGRESS NOTE    Date Time: 06/27/2012 10:51 PM  Patient Name: [name], [name]  Patient Status: Inpatient  Hospital Day: 2    Assessment:   1. Neutropenic fever.   2. Pancytopenia.   3. Weight loss with a body mass index of 18 shows mild to moderate   malnutrition.      Plan:   1. Emperic Levaquin/CTX  2. Presumed Viral infection, viral serology per ID  3. Bone Marrow if neutropenia persists    Subjective:   Aches, fatigue  10 point Review of Systems - Negative except for the Positives mentioned above      Medications:     Current Facility-Administered Medications   Medication Dose Route Frequency   . cefTRIAXone  2 g Intravenous QHS   . enoxaparin  40 mg Subcutaneous Q24H   . levofloxacin  500 mg Intravenous Q24H          [EXPIRED] acetaminophen 650 mg Q6H PRN   acetaminophen 650 mg Q4H PRN   morphine 2 mg Q2H PRN   naloxone 0.2 mg PRN   [EXPIRED] ondansetron 4 mg Q8H PRN   [EXPIRED] promethazine 12.5 mg Q6H PRN        Physical Exam:     Filed Vitals:    06/27/12 1950   BP:    Pulse:    Temp: 99.4 F (37.4 C)   Resp:    SpO2:        Intake and Output Summary (Last 24 hours) at Date Time  No intake or output data in the 24 hours ending 06/27/12 2251    Physical Exam   Constitutional: She is oriented to person, place, and time. She appears well-developed.   HENT:   Head: Normocephalic and atraumatic.   Eyes: Pupils are equal, round, and reactive to light.   Neck: No JVD present. No tracheal deviation present. No thyromegaly present.   Cardiovascular: Normal rate and normal heart sounds.    Pulmonary/Chest: No respiratory distress. She has no wheezes. She has no rales.   Abdominal: She exhibits no distension. There is no tenderness. There is no rebound and no guarding.   Musculoskeletal: She exhibits no edema.   Neurological: She is alert and oriented to person, place, and time.             Labs:     Results     Procedure Component Value Units Date/Time    Reticulocytes [[id number]]  (Abnormal)  Collected:06/27/12 1618    Specimen Information:Blood Updated:06/27/12 1630     Reticulocyte Count Automated 0.4 (L) %      Retic Ct Abs 0.0110 (L)      Immature Retic Fract 2.9 (L) %      Immature Plt Fraction 4.7 %      Retic Hgb 31.2 pg     Parvovirus B19 IgG & IgM [[id number]] Collected:06/27/12 1618     Updated:06/27/12 1624    Narrative:    Keep at room temp    Cell MorpHology [[id number]] Collected:06/27/12 0510     Cell Morphology: Normal Updated:06/27/12 0756    CBC with differential [[id number]]  (Abnormal) Collected:06/27/12 0510    Specimen Information:Blood / Blood Updated:06/27/12 0756     WBC 1.20 (L)      RBC 3.01 (L)      Hgb 9.9 (L) g/dL      Hematocrit 03.4 (L) %      MCV 94.7 fL      MCH 32.9 (H) pg  MCHC 34.7 g/dL      RDW 14 %      Platelets 89 (L)      MPV 10.6 fL     Manual Differential [[id number]]  (Abnormal) Collected:06/27/12 0510     Segmented Neutrophils - 31 % Updated:06/27/12 0756     Band Neutrophils 0 %      Lymphocytes Manual 69 %      Monocytes Manual 0 %      Eosinophils Manual 0 %      Basophils Manual 0 %      Nucleated RBC 0      Abs Seg Manual 0.37 (L)      Bands Absolute 0.00      Absolute Lymph Manual 0.83      Monocytes Absolute 0.00      Absolute Eos Manual 0.00      Absolute Baso Manual 0.00     Blood Culture #1 [[id number]] Collected:06/25/12 2241    Specimen Information:Blood / Blood Updated:06/27/12 0221    Narrative:    ORDER#: [id number]                                    ORDERED BY: Maisie Fus, SASHA  SOURCE: Blood arm                                    COLLECTED:  06/25/12 22:41  ANTIBIOTICS AT COLL.:                                RECEIVED :  06/26/12 01:43  Culture Blood                              PRELIM      06/27/12 02:21  06/27/12   No Growth after 1 day/s of incubation.      Blood Culture #2 [[id number]] Collected:06/25/12 2221    Specimen Information:Blood / Blood Updated:06/27/12 0221    Narrative:    ORDER#: [id number]                                    ORDERED  BY: Audley Hose  SOURCE: Blood arm                                    COLLECTED:  06/25/12 22:21  ANTIBIOTICS AT COLL.:                                RECEIVED :  06/26/12 01:43  Culture Blood                              PRELIM      06/27/12 02:21  06/27/12   No Growth after 1 day/s of incubation.      STREP PNEUMONIA, RAPID URINARY ANTIGEN [[id number]] Collected:06/26/12 1648    Specimen Information:Urine, Clean Catch Updated:06/27/12 0105    Narrative:    ORDER#: [id number]  ORDERED BY: CHOUDHARY, IMTI  SOURCE: Urine, Clean Catch                           COLLECTED:  06/26/12 16:48  ANTIBIOTICS AT COLL.:                                RECEIVED :  06/26/12 21:03  S. pneumoniae, Rapid Urinary Antigen       FINAL       06/27/12 01:05  06/27/12   Negative for Streptococcus pneumoniae Urinary Antigen             Note:             This is a presumptive test for the direct qualitative             detection of bacterial antigen. This test is not intended as             a substitute for a gram stain and bacterial culture. Samples             with extremely low levels of antigen may yield negative             results.             Reference Range: Negative      Legionella antigen, urine [[id number]] Collected:06/26/12 1648    Specimen Information:Urine / Urine, Clean Catch Updated:06/27/12 0103    Narrative:    ORDER#: [id number]                                    ORDERED BY: Janalyn Rouse, IMTI  SOURCE: Urine, Clean Catch                           COLLECTED:  06/26/12 16:48  ANTIBIOTICS AT COLL.:                                RECEIVED :  06/26/12 21:03  Legionella, Rapid Urinary Antigen          FINAL       06/27/12 01:04  06/27/12   Negative for Legionella pneumophila Serogroup 1 Antigen             Limitations of Test:             1. Negative results do not exclude infection with Legionella                pneumophila Serogroup 1.             2. Does not detect other serogroups of L.  pneumophila                or other Legionella species.             Test Reference Range: Negative      Urine culture [[id number]] Collected:06/25/12 2221    Specimen Information:Urine / Urine, Clean Catch Updated:06/27/12 0002    Narrative:    ORDER#: [id number]                                    ORDERED BY: Audley Hose  SOURCE: Urine, Clean  Catch                           COLLECTED:  06/25/12 22:21  ANTIBIOTICS AT COLL.:                                RECEIVED :  06/26/12 01:46  Culture Urine                              FINAL       06/27/12 00:02  06/27/12   No growth of >1,000 CFU/ML, No further work              Rads:     Xr Chest 2 Views    06/26/2012   CLINICAL INDICATION: pneumonia  COMPARISON: None available  FINDINGS:   2 views of the chest were obtained. The lungs are clear. The heart and vascularity are within normal limits.  There is no pleural thickening or effusion. The osseous structures are unremarkable.      06/26/2012   No active cardiopulmonary disease   Heron Nay, MD  06/26/2012 11:12 PM     Chest 2 Views    06/25/2012  CLINICAL INDICATION: Fever  COMPARISON: None.  INTERPRETATION: Frontal and lateral views of the chest obtained. Cardiomediastinal contour within normal limits for age. Clear lungs and sharp sulci.           06/25/2012   No acute cardiopulmonary process.  Filbert Schilder, MD  06/25/2012 10:43 PM         Signed by: Eric Form, MD  Pager: [phone]

## 2012-06-28 ENCOUNTER — Inpatient Hospital Stay: Payer: 59

## 2012-06-28 LAB — MAN DIFF ONLY
Band Neutrophils Absolute: 0 (ref 0.00–1.00)
Band Neutrophils: 0 %
Basophils Absolute Manual: 0 (ref 0.00–0.20)
Basophils Manual: 0 %
Eosinophils Absolute Manual: 0 (ref 0.00–0.70)
Eosinophils Manual: 0 %
Lymphocytes Absolute Manual: 0.48 — ABNORMAL LOW (ref 0.50–4.40)
Lymphocytes Manual: 65 %
Monocytes Absolute: 0.01 (ref 0.00–1.20)
Monocytes Manual: 1 %
Neutrophils Absolute Manual: 0.25 — ABNORMAL LOW (ref 1.80–8.10)
Nucleated RBC: 0 (ref 0–1)
Segmented Neutrophils: 34 %

## 2012-06-28 LAB — CBC AND DIFFERENTIAL
Hematocrit: 25.3 % — ABNORMAL LOW (ref 37.0–47.0)
Hgb: 8.8 g/dL — ABNORMAL LOW (ref 12.0–16.0)
MCH: 32.4 pg — ABNORMAL HIGH (ref 28.0–32.0)
MCHC: 34.8 g/dL (ref 32.0–36.0)
MCV: 93 fL (ref 80.0–100.0)
MPV: 9.9 fL (ref 9.4–12.3)
Platelets: 74 — ABNORMAL LOW (ref 140–400)
RBC: 2.72 — ABNORMAL LOW (ref 4.20–5.40)
RDW: 14 % (ref 12–15)
WBC: 0.74 — ABNORMAL LOW (ref 3.50–10.80)

## 2012-06-28 LAB — CELL MORPHOLOGY: Cell Morphology: NORMAL

## 2012-06-28 MED ORDER — ALPRAZOLAM 0.25 MG PO TABS
0.2500 mg | ORAL_TABLET | Freq: Every evening | ORAL | Status: DC | PRN
Start: 2012-06-28 — End: 2012-07-28
  Administered 2012-07-09 – 2012-07-13 (×2): 0.25 mg via ORAL
  Filled 2012-06-28 (×3): qty 1

## 2012-06-28 NOTE — Progress Notes (Signed)
Infectious Diseases & Tropical Medicine  Progress Note    06/28/2012   Ahmoni Edge ZOX:[id number],XBJ:[id number] is a 57 y.o. female,       Assessment:     Febrile neutropenia  Pancytopenia   Likely viral syndrome  Cough     Plan:      Continue Levaquin,add ceftriaxone   CT scan of sinuses and chest normal    Monitor clinically   Monitor CBC daily   Oncology following   Scheduled for bone marrow biopsy and cytogenetics    ROS:     General:  periodic fevers, no chills, no rigor,awake,alert   HEENT: no neck pain, no throat pain,c/o sinus congestion  Endocrine: c/o fatigue   Respiratory: mild cough,no shortness of breath, or wheezing   Cardiovascular: no chest pain   Gastrointestinal: no abdominal pain,no N/V/D  Genito-Urinary: no dysuria, trouble voiding, or hematuria   Musculoskeletal: no edema  Neurological: c/o generalized weakness   Dermatological: no rash, no ulcer    Physical Examination:     Blood pressure 119/61, pulse 70, temperature 97.5 F (36.4 C), temperature source Oral, resp. rate 20, height 1.651 m (5\' 5" ), weight 50.349 kg (111 lb), SpO2 97.00%.     General Appearance: Comfortable, and in no acute distress. Awake,alert    HEENT: Pupils are equal, round, and reactive to light.    Lungs: CTA   Heart: RRR   Chest: Symmetric chest wall expansion.    Abdomen: soft ,non tender,no hepatosplenomegaly   Neurological: No focal deficit   Extremities: No edema    Laboratory And Diagnostic Studies:     Recent Labs   [facility] 06/28/12 0659 06/27/12 0510    WBC 0.74* 1.20*    HGB 8.8* 9.9*    HCT 25.3* 28.5*    PLT 74* 89*     Recent Labs   Basename 06/25/12 2221    NA 135*    K 4.0    CL 102    CO2 23    BUN 11.0    CREAT 0.8    GLU 131*    CA 9.3     Recent Labs   Basename 06/25/12 2221    AST 31    ALT 23    ALKPHOS 66    PROT 6.4    ALB 3.4*       Current Meds:      Scheduled Meds: PRN Meds:           cefTRIAXone 2 g Intravenous QHS   enoxaparin 40 mg Subcutaneous Q24H   levofloxacin 500 mg  Intravenous Q24H       Continuous Infusions:         acetaminophen 650 mg Q4H PRN   morphine 2 mg Q2H PRN   naloxone 0.2 mg PRN         Terena Bohan A. Janalyn Rouse, M.D.  06/28/2012  8:38 AM

## 2012-06-28 NOTE — Plan of Care (Signed)
Problem: Safety  Goal: Patient will be free from injury during hospitalization  Outcome: Progressing  Pt is A&O x4, self care. Call bell and personal items within reach, pt rounded on hourly.  Plan: Monitor for safety, continue hourly rounding.     Problem: Protective Precautions [in description add neutropenic precautions]  Goal: Free from nosocomial infections  Outcome: Progressing  Pt on neutropenic precautions. Isolation precautions maintained.  Handout on neutropenia given to pt. All questions answered. Medicated with PRN Tylenol for low grade fever.   Plan: Continue isolation precautions, monitor WBC.

## 2012-06-28 NOTE — Progress Notes (Signed)
Daily PROGRESS NOTE    Date Time: 06/28/2012 4:11 PM  Patient Name: [name], [name]  Patient Status: Inpatient  Hospital Day: 3    Assessment:   1. Neutropenic fever  2. Pancytopenia.   3. Weight loss with a body mass index of 18 shows mild to moderate   malnutrition.      Plan:   1. Emperic Levaquin/CTX  2. Presumed Viral infection, viral serology per ID  3. Bone Marrow if neutropenia persists  4. D/w oncology    Subjective:   Aches, fatigue, Insomnia  10 point Review of Systems - Negative except for the Positives mentioned above      Medications:     Current Facility-Administered Medications   Medication Dose Route Frequency   . cefTRIAXone  2 g Intravenous QHS   . enoxaparin  40 mg Subcutaneous Q24H   . levofloxacin  500 mg Intravenous Q24H          acetaminophen 650 mg Q4H PRN   ALPRAZolam 0.25 mg QHS PRN   morphine 2 mg Q2H PRN   naloxone 0.2 mg PRN        Physical Exam:     Filed Vitals:    06/28/12 0809   BP: 119/61   Pulse: 70   Temp: 97.5 F (36.4 C)   Resp: 20   SpO2: 97%       Intake and Output Summary (Last 24 hours) at Date Time  No intake or output data in the 24 hours ending 06/28/12 1611    Physical Exam   Constitutional: She is oriented to person, place, and time. She appears well-developed.   HENT:   Head: Normocephalic and atraumatic.   Eyes: Pupils are equal, round, and reactive to light.   Neck: No JVD present. No tracheal deviation present. No thyromegaly present.   Cardiovascular: Normal rate and normal heart sounds.    Pulmonary/Chest: No respiratory distress. She has no wheezes. She has no rales.   Abdominal: She exhibits no distension. There is no tenderness. There is no rebound and no guarding.   Musculoskeletal: She exhibits no edema.   Neurological: She is alert and oriented to person, place, and time.             Labs:     Results     Procedure Component Value Units Date/Time    Manual Differential [[id number]]  (Abnormal) Collected:06/28/12 0659     Segmented Neutrophils - 34 %  Updated:06/28/12 0802     Band Neutrophils 0 %      Lymphocytes Manual 65 %      Monocytes Manual 1 %      Eosinophils Manual 0 %      Basophils Manual 0 %      Nucleated RBC 0      Abs Seg Manual 0.25 (L)      Bands Absolute 0.00      Absolute Lymph Manual 0.48 (L)      Monocytes Absolute 0.01      Absolute Eos Manual 0.00      Absolute Baso Manual 0.00     Cell MorpHology [[id number]] Collected:06/28/12 0659     Cell Morphology: Normal Updated:06/28/12 0802    CBC with differential [[id number]]  (Abnormal) Collected:06/28/12 0659    Specimen Information:Blood / Blood Updated:06/28/12 0802     WBC 0.74 (L)      RBC 2.72 (L)      Hgb 8.8 (L) g/dL      Hematocrit 08.6 (L) %  MCV 93.0 fL      MCH 32.4 (H) pg      MCHC 34.8 g/dL      RDW 14 %      Platelets 74 (L)      MPV 9.9 fL     Blood Culture #2 [[id number]] Collected:06/25/12 2221    Specimen Information:Blood / Blood Updated:06/28/12 0221    Narrative:    ORDER#: [id number]                                    ORDERED BY: Audley Hose  SOURCE: Blood arm                                    COLLECTED:  06/25/12 22:21  ANTIBIOTICS AT COLL.:                                RECEIVED :  06/26/12 01:43  Culture Blood                              PRELIM      06/28/12 02:21  06/27/12   No Growth after 1 day/s of incubation.  06/28/12   No Growth after 2 day/s of incubation.      Blood Culture #1 [[id number]] Collected:06/25/12 2241    Specimen Information:Blood / Blood Updated:06/28/12 0221    Narrative:    ORDER#: [id number]                                    ORDERED BY: Audley Hose  SOURCE: Blood arm                                    COLLECTED:  06/25/12 22:41  ANTIBIOTICS AT COLL.:                                RECEIVED :  06/26/12 01:43  Culture Blood                              PRELIM      06/28/12 02:21  06/27/12   No Growth after 1 day/s of incubation.  06/28/12   No Growth after 2 day/s of incubation.      Reticulocytes [[id number]]  (Abnormal) Collected:06/27/12  1618    Specimen Information:Blood Updated:06/27/12 1630     Reticulocyte Count Automated 0.4 (L) %      Retic Ct Abs 0.0110 (L)      Immature Retic Fract 2.9 (L) %      Immature Plt Fraction 4.7 %      Retic Hgb 31.2 pg     Parvovirus B19 IgG & IgM [[id number]] Collected:06/27/12 1618     Updated:06/27/12 1624    Narrative:    Keep at room temp            Rads:     Xr Chest 2 Views    06/26/2012   CLINICAL INDICATION: pneumonia  COMPARISON: None available  FINDINGS:   2 views of the chest were obtained. The lungs are clear. The heart and vascularity are within normal limits.  There is no pleural thickening or effusion. The osseous structures are unremarkable.      06/26/2012   No active cardiopulmonary disease   Heron Nay, MD  06/26/2012 11:12 PM     Chest 2 Views    06/25/2012  CLINICAL INDICATION: Fever  COMPARISON: None.  INTERPRETATION: Frontal and lateral views of the chest obtained. Cardiomediastinal contour within normal limits for age. Clear lungs and sharp sulci.           06/25/2012   No acute cardiopulmonary process.  Filbert Schilder, MD  06/25/2012 10:43 PM         Signed by: Eric Form, MD  Pager: [phone]

## 2012-06-28 NOTE — Progress Notes (Signed)
HEMATOLOGY ONCOLOGY PROGRESS NOTE    Date Time: 06/28/2012 12:40 PM  Patient Name: [name],[name]      IMPRESSION:     Patient Active Problem List   Diagnosis   . Chondromalacia of patella   . Neutropenic fever         PLAN:  As per ID.  A CT of the sinuses and chest have been ordered.  I will request that a CT-guided bone marrow biopsy and aspirate be performed.  Samples should be sent for flow cytometry, cytogenetics and routine histology.  Risks and benefits of the biopsy were reviewed and she is in agreement.             HISTORY: [name] is a 57 year old who was in her usual state of excellent health   until approximately 5 days ago when she began to experience arthralgias,   myalgias, and some rhinorrhea. She was noted to have temperature   elevations to as high as 102 and 103 degrees. It was recommended that she   report to the emergency room where she was discovered to have a temperature   of 102 degrees. A CBC at the time of admission revealed a WBC of 0.84,   hemoglobin 11.4, hematocrit 34.2 and a platelet count of 107. Her absolute   neutrophil count was 0.39.  Interval History:  She has developed progressive pancytopenia and remains febrile despite antibiotics.       PHYSICAL EXAM:     Filed Vitals:    06/28/12 0809   BP: 119/61   Pulse: 70   Temp: 97.5 F (36.4 C)   Resp: 20   SpO2: 97%       Intake and Output Summary (Last 24 hours) at Date Time  No intake or output data in the 24 hours ending 06/28/12 1240    Appearance: in no acute distress  HEENT: pharynx clear  Lungs: clear to ausculation  Heart: regular rate and rhythm, normal heart sounds  Abdomen: flat and soft, active bowel sounds  Extremities: no edema  Neuro: alert, appropriate  MEDS:   Scheduled Meds:  Current Facility-Administered Medications   Medication Dose Route Frequency   . cefTRIAXone  2 g Intravenous QHS   . enoxaparin  40 mg Subcutaneous Q24H   . levofloxacin  500 mg Intravenous Q24H     Continuous Infusions:   PRN  Meds:.acetaminophen, ALPRAZolam, morphine, naloxone    LABS:     Results     Procedure Component Value Units Date/Time    Manual Differential [[id number]]  (Abnormal) Collected:06/28/12 0659     Segmented Neutrophils - 34 % Updated:06/28/12 0802     Band Neutrophils 0 %      Lymphocytes Manual 65 %      Monocytes Manual 1 %      Eosinophils Manual 0 %      Basophils Manual 0 %      Nucleated RBC 0      Abs Seg Manual 0.25 (L)      Bands Absolute 0.00      Absolute Lymph Manual 0.48 (L)      Monocytes Absolute 0.01      Absolute Eos Manual 0.00      Absolute Baso Manual 0.00     Cell MorpHology [[id number]] Collected:06/28/12 0659     Cell Morphology: Normal Updated:06/28/12 0802    CBC with differential [[id number]]  (Abnormal) Collected:06/28/12 0659    Specimen Information:Blood / Blood Updated:06/28/12 0865  WBC 0.74 (L)      RBC 2.72 (L)      Hgb 8.8 (L) g/dL      Hematocrit 57.8 (L) %      MCV 93.0 fL      MCH 32.4 (H) pg      MCHC 34.8 g/dL      RDW 14 %      Platelets 74 (L)      MPV 9.9 fL     Blood Culture #2 [[id number]] Collected:06/25/12 2221    Specimen Information:Blood / Blood Updated:06/28/12 0221    Narrative:    ORDER#: [id number]                                    ORDERED BY: Audley Hose  SOURCE: Blood arm                                    COLLECTED:  06/25/12 22:21  ANTIBIOTICS AT COLL.:                                RECEIVED :  06/26/12 01:43  Culture Blood                              PRELIM      06/28/12 02:21  06/27/12   No Growth after 1 day/s of incubation.  06/28/12   No Growth after 2 day/s of incubation.      Blood Culture #1 [[id number]] Collected:06/25/12 2241    Specimen Information:Blood / Blood Updated:06/28/12 0221    Narrative:    ORDER#: [id number]                                    ORDERED BY: Audley Hose  SOURCE: Blood arm                                    COLLECTED:  06/25/12 22:41  ANTIBIOTICS AT COLL.:                                RECEIVED :  06/26/12 01:43  Culture  Blood                              PRELIM      06/28/12 02:21  06/27/12   No Growth after 1 day/s of incubation.  06/28/12   No Growth after 2 day/s of incubation.      Reticulocytes [[id number]]  (Abnormal) Collected:06/27/12 1618    Specimen Information:Blood Updated:06/27/12 1630     Reticulocyte Count Automated 0.4 (L) %      Retic Ct Abs 0.0110 (L)      Immature Retic Fract 2.9 (L) %      Immature Plt Fraction 4.7 %      Retic Hgb 31.2 pg     Parvovirus B19 IgG & IgM [[id number]] Collected:06/27/12 1618     Updated:06/27/12 1624    Narrative:    Keep at room  temp          IMAGING DATA:  Xr Chest 2 Views    06/26/2012   CLINICAL INDICATION: pneumonia  COMPARISON: None available  FINDINGS:   2 views of the chest were obtained. The lungs are clear. The heart and vascularity are within normal limits.  There is no pleural thickening or effusion. The osseous structures are unremarkable.      06/26/2012   No active cardiopulmonary disease   Heron Nay, MD  06/26/2012 11:12 PM     Chest 2 Views    06/25/2012  CLINICAL INDICATION: Fever  COMPARISON: None.  INTERPRETATION: Frontal and lateral views of the chest obtained. Cardiomediastinal contour within normal limits for age. Clear lungs and sharp sulci.           06/25/2012   No acute cardiopulmonary process.  Filbert Schilder, MD  06/25/2012 10:43 PM                       Larene Pickett, MD  [facility]    [phone]

## 2012-06-28 NOTE — Plan of Care (Signed)
Problem: Pain  Goal: Patient's pain/discomfort is manageable  Outcome: Progressing  Pt c/o headache, tylenol was given, felt better. Had fever early this morning, temp is 97.8 after that. WBC is still low, on neutropenic isolation.  Plan: assess and manage headache, monitor temp and WBC count

## 2012-06-29 ENCOUNTER — Ambulatory Visit: Payer: Self-pay

## 2012-06-29 ENCOUNTER — Encounter
Admission: EM | Disposition: A | Discharge status: Home / Self Care | Payer: Self-pay | Source: Home / Self Care | Attending: Internal Medicine

## 2012-06-29 LAB — CBC AND DIFFERENTIAL
Hematocrit: 28.6 % — ABNORMAL LOW (ref 37.0–47.0)
Hgb: 9.8 g/dL — ABNORMAL LOW (ref 12.0–16.0)
MCH: 32.9 pg — ABNORMAL HIGH (ref 28.0–32.0)
MCHC: 34.3 g/dL (ref 32.0–36.0)
MCV: 96 fL (ref 80.0–100.0)
MPV: 10.3 fL (ref 9.4–12.3)
Platelets: 85 — ABNORMAL LOW (ref 140–400)
RBC: 2.98 — ABNORMAL LOW (ref 4.20–5.40)
RDW: 13 % (ref 12–15)
WBC: 0.83 — ABNORMAL LOW (ref 3.50–10.80)

## 2012-06-29 LAB — MAN DIFF ONLY
Band Neutrophils Absolute: 0 (ref 0.00–1.00)
Band Neutrophils: 0 %
Basophils Absolute Manual: 0 (ref 0.00–0.20)
Basophils Manual: 0 %
Eosinophils Absolute Manual: 0 (ref 0.00–0.70)
Eosinophils Manual: 0 %
Lymphocytes Absolute Manual: 0.68 (ref 0.50–4.40)
Lymphocytes Manual: 82 %
Monocytes Absolute: 0.03 (ref 0.00–1.20)
Monocytes Manual: 4 %
Neutrophils Absolute Manual: 0.12 — ABNORMAL LOW (ref 1.80–8.10)
Nucleated RBC: 0 (ref 0–1)
Segmented Neutrophils: 14 %

## 2012-06-29 LAB — PT AND APTT
PT INR: 1 (ref 0.9–1.1)
PT: 13.2 (ref 12.6–15.0)
PTT: 30 (ref 23–37)

## 2012-06-29 LAB — CELL MORPHOLOGY: Cell Morphology: NORMAL

## 2012-06-29 SURGERY — BIOPSY, BONE MARROW
Anesthesia: Anesthesia Choice

## 2012-06-29 MED ORDER — FENTANYL CITRATE 0.05 MG/ML IJ SOLN
INTRAMUSCULAR | Status: AC
Start: 2012-06-29 — End: 2012-06-29
  Filled 2012-06-29: qty 2

## 2012-06-29 MED ORDER — MIDAZOLAM HCL 5 MG/5ML IJ SOLN
INTRAMUSCULAR | Status: AC | PRN
Start: 2012-06-29 — End: 2012-06-29
  Administered 2012-06-29: 2 mg via INTRAVENOUS

## 2012-06-29 MED ORDER — FENTANYL CITRATE 0.05 MG/ML IJ SOLN
INTRAMUSCULAR | Status: AC | PRN
Start: 2012-06-29 — End: 2012-06-29
  Administered 2012-06-29: 100 ug via INTRAVENOUS

## 2012-06-29 MED ORDER — MIDAZOLAM HCL 2 MG/2ML IJ SOLN
INTRAMUSCULAR | Status: AC
Start: 2012-06-29 — End: 2012-06-29
  Filled 2012-06-29: qty 2

## 2012-06-29 MED ORDER — FENTANYL CITRATE 0.05 MG/ML IJ SOLN
INTRAMUSCULAR | Status: AC | PRN
Start: 2012-06-29 — End: 2012-06-29
  Administered 2012-06-29: 50 ug via INTRAVENOUS

## 2012-06-29 MED ORDER — BUPIVACAINE HCL (PF) 0.5 % IJ SOLN
INTRAMUSCULAR | Status: AC
Start: 2012-06-29 — End: 2012-06-29
  Administered 2012-06-29: 8 mL
  Filled 2012-06-29: qty 30

## 2012-06-29 MED ORDER — MIDAZOLAM HCL 5 MG/5ML IJ SOLN
INTRAMUSCULAR | Status: AC | PRN
Start: 2012-06-29 — End: 2012-06-29
  Administered 2012-06-29: 1 mg via INTRAVENOUS

## 2012-06-29 MED ORDER — LIDOCAINE HCL (PF) 1 % IJ SOLN
INTRAMUSCULAR | Status: AC
Start: 2012-06-29 — End: 2012-06-29
  Administered 2012-06-29: 10 mL
  Filled 2012-06-29: qty 30

## 2012-06-29 NOTE — Progress Notes (Signed)
Daily PROGRESS NOTE    Date Time: 06/29/2012 11:12 PM  Patient Name: [name], [name]  Patient Status: Inpatient  Hospital Day: 4    Assessment:   1. Neutropenic fever  2. Pancytopenia.   3. Weight loss with a body mass index of 18 shows mild to moderate   Malnutrition  4. Skin rash.      Plan:   1. Emperic Levaquin/CTX  2. S/p Bone Marrow  3. Monitor rash next 24 hrs  4. Stool c.diff toxin is negative    Subjective:   Aches, fatigue, Insomnia  10 point Review of Systems - Negative except for the Positives mentioned above      Medications:     Current Facility-Administered Medications   Medication Dose Route Frequency   . [COMPLETED] bupivacaine (PF)       . cefTRIAXone  2 g Intravenous QHS   . enoxaparin  40 mg Subcutaneous Q24H   . [COMPLETED] fentaNYL       . [COMPLETED] fentaNYL       . levofloxacin  500 mg Intravenous Q24H   . [COMPLETED] lidocaine       . [COMPLETED] midazolam       . [COMPLETED] midazolam              acetaminophen 650 mg Q4H PRN   ALPRAZolam 0.25 mg QHS PRN   [COMPLETED] fentaNYL  Code/Trauma Med   [COMPLETED] fentaNYL  Code/Trauma Med   [COMPLETED] midazolam  Code/Trauma Med   [COMPLETED] midazolam  Code/Trauma Med   morphine 2 mg Q2H PRN   naloxone 0.2 mg PRN        Physical Exam:     Filed Vitals:    06/29/12 2244   BP: 103/58   Pulse: 74   Temp: 99.4 F (37.4 C)   Resp: 18   SpO2: 99%       Intake and Output Summary (Last 24 hours) at Date Time  No intake or output data in the 24 hours ending 06/29/12 2312    Physical Exam   Constitutional: She is oriented to person, place, and time. She appears well-developed.   HENT:   Head: Normocephalic and atraumatic.   Eyes: Pupils are equal, round, and reactive to light.   Neck: No JVD present. No tracheal deviation present. No thyromegaly present.   Cardiovascular: Normal rate and normal heart sounds.    Pulmonary/Chest: No respiratory distress. She has no wheezes. She has no rales.   Abdominal: She exhibits no distension. There is no  tenderness. There is no rebound and no guarding.   Musculoskeletal: She exhibits no edema.   Neurological: She is alert and oriented to person, place, and time.             Labs:     Results     Procedure Component Value Units Date/Time    Clostridium difficile toxin [[id number]] Collected:06/29/12 1208    Specimen Information:Stool / Stool Updated:06/29/12 2019    Narrative:    ORDER#: [id number]                                    ORDERED BY: Tobie Lords  SOURCE: Stool                                        COLLECTED:  06/29/12  12:08  ANTIBIOTICS AT COLL.:                                RECEIVED :  06/29/12 16:30  C Difficile Toxin A and B                  FINAL       06/29/12 20:18  06/29/12   Negative for Toxigenic C. difficile             Test performed using [employer] DNA amplification assay.             Testing more than once in 7 days is unnecessary and not             recommended for this sensitive DNA test method.             Note: Testing will not be performed on formed stools.      CBC and differential [[id number]]  (Abnormal) Collected:06/25/12 2221    Specimen Information:Blood / Blood Updated:06/29/12 1626     WBC 0.84 (L)      RBC 3.55 (L)      Hgb 11.4 (L) g/dL      Hematocrit 47.8 (L) %      MCV 96.3 fL      MCH 32.1 (H) pg      MCHC 33.3 g/dL      RDW 14 %      Platelets 107 (L)      MPV 9.9 fL     CBC with differential [[id number]]  (Abnormal) Collected:06/27/12 0510    Specimen Information:Blood / Blood Updated:06/29/12 1626     WBC 1.20 (L)      RBC 3.01 (L)      Hgb 9.9 (L) g/dL      Hematocrit 65.7 (L) %      MCV 94.7 fL      MCH 32.9 (H) pg      MCHC 34.7 g/dL      RDW 14 %      Platelets 89 (L)      MPV 10.6 fL     CBC with differential [[id number]]  (Abnormal) Collected:06/28/12 0659    Specimen Information:Blood / Blood Updated:06/29/12 1626     WBC 0.74 (L)      RBC 2.72 (L)      Hgb 8.8 (L) g/dL      Hematocrit 84.1 (L) %      MCV 93.0 fL      MCH 32.4 (H) pg      MCHC 34.8 g/dL      RDW  14 %      Platelets 74 (L)      MPV 9.9 fL     CBC with differential [[id number]]  (Abnormal) Collected:06/29/12 0608    Specimen Information:Blood / Blood Updated:06/29/12 1626     WBC 0.83 (L)      RBC 2.98 (L)      Hgb 9.8 (L) g/dL      Hematocrit 25.3 (L) %      MCV 96.0 fL      MCH 32.9 (H) pg      MCHC 34.3 g/dL      RDW 13 %      Platelets 85 (L)      MPV 10.3 fL     PT/APTT [[id number]] Collected:06/29/12 0859     PT 13.2 Updated:06/29/12 0924     PT  INR 1.0      PT Anticoag. Given Within 48 hrs. None      PTT 30     Manual Differential [[id number]]  (Abnormal) Collected:06/29/12 0608     Segmented Neutrophils - 14 % Updated:06/29/12 0857     Band Neutrophils 0 %      Lymphocytes Manual 82 %      Monocytes Manual 4 %      Eosinophils Manual 0 %      Basophils Manual 0 %      Nucleated RBC 0      Abs Seg Manual 0.12 (L)      Bands Absolute 0.00      Absolute Lymph Manual 0.68      Monocytes Absolute 0.03      Absolute Eos Manual 0.00      Absolute Baso Manual 0.00     Cell MorpHology [[id number]] Collected:06/29/12 0608     Cell Morphology: Normal Updated:06/29/12 0857    Blood Culture #2 [[id number]] Collected:06/25/12 2221    Specimen Information:Blood / Blood Updated:06/29/12 0221    Narrative:    ORDER#: [id number]                                    ORDERED BY: Audley Hose  SOURCE: Blood arm                                    COLLECTED:  06/25/12 22:21  ANTIBIOTICS AT COLL.:                                RECEIVED :  06/26/12 01:43  Culture Blood                              PRELIM      06/29/12 02:21  06/27/12   No Growth after 1 day/s of incubation.  06/28/12   No Growth after 2 day/s of incubation.  06/29/12   No Growth after 3 day/s of incubation.      Blood Culture #1 [[id number]] Collected:06/25/12 2241    Specimen Information:Blood / Blood Updated:06/29/12 0221    Narrative:    ORDER#: [id number]                                    ORDERED BY: Audley Hose  SOURCE: Blood arm                                     COLLECTED:  06/25/12 22:41  ANTIBIOTICS AT COLL.:                                RECEIVED :  06/26/12 01:43  Culture Blood                              PRELIM      06/29/12 02:21  06/27/12   No Growth after 1 day/s of incubation.  06/28/12   No Growth after 2 day/s  of incubation.  06/29/12   No Growth after 3 day/s of incubation.              Rads:     Xr Chest 2 Views    06/26/2012   CLINICAL INDICATION: pneumonia  COMPARISON: None available  FINDINGS:   2 views of the chest were obtained. The lungs are clear. The heart and vascularity are within normal limits.  There is no pleural thickening or effusion. The osseous structures are unremarkable.      06/26/2012   No active cardiopulmonary disease   Heron Nay, MD  06/26/2012 11:12 PM     Chest 2 Views    06/25/2012  CLINICAL INDICATION: Fever  COMPARISON: None.  INTERPRETATION: Frontal and lateral views of the chest obtained. Cardiomediastinal contour within normal limits for age. Clear lungs and sharp sulci.           06/25/2012   No acute cardiopulmonary process.  Filbert Schilder, MD  06/25/2012 10:43 PM         Signed by: Eric Form, MD  Pager: [phone]

## 2012-06-29 NOTE — Brief Op Note (Signed)
[facility]  [facility]   Brief Op Note     Physician(s): Verlee Rossetti, MD    Assistant(s):  None    Pre-operative Diagnosis: Pancytopenia    Post-operative Diagnosis: Diagnosis is same as preop diagnosis    Procedure(s) Performed: 1. Bone Marrow Aspirate                                               2. 11 G Core Bone Biopsy                                                                                                                                                                                                                 Anesthesia:  Moderate Sedation, Local with 1% Lidocaine and 0.25% Sensorcaine    Complications: None    Estimated Blood Loss:  Minimal    Blood Aministered:  None    Fluid Aministered:  Per Nursing    Tubes and Drains: None    Implant(s):  None    Specimens: 1. Bone marrow aspirate                        2. 11 g core bone biopsy left iliac wing    Findings: Fluoroscopy used to guide bone marrow aspirate and core bone biopsy.    Patient was transferred from the procedure room to the [facility] in stable condition.  Procedure note dictated.    Signed by: Verlee Rossetti, MD  [facility] Department  [facility] [phone]  [facility] [phone]

## 2012-06-29 NOTE — Plan of Care (Signed)
Problem: Hemodynamic Status: Immune Suppressed-Hematologic  Goal: Stable vital signs and fluid balance  Outcome: Progressing  Intervention: Monitor and assess vitals and hemodynamic parameters.  Pt still spiking fever, BP WNL, not in respiratory distress, denies any pain, po Tylenol given prn, ice pack provided, IV antibiotic given per order, maintained neutropenic precaution, pt for bone marrow biopsy in AM.  PLAN: continue to monitor vital signs and for s/s of infection, maintain neutropenic precaution, pt for biopsy in AM.

## 2012-06-29 NOTE — Progress Notes (Signed)
Pt denies pain, level 0/10. VS stable. L Iliac biopsy area covered with dressing clean , dry and intact.

## 2012-06-29 NOTE — Progress Notes (Signed)
[facility] Diseases [facility]  Progress Note    06/29/2012   [name],XBJ:[id number] is a 57 y.o. female,       Assessment:     Febrile neutropenia persists  Pancytopenia   Rule out malignancy  Cough improving     Plan:      Continue Levaquin,add ceftriaxone   Monitor clinically   Monitor CBC   Oncology following   Scheduled for bone marrow biopsy and cytogenetics today    ROS:     General:  periodic fevers, no chills, no rigor,awake,alert , feeling better  HEENT: no neck pain, no throat pain,c/o sinus congestion  Endocrine: c/o fatigue   Respiratory: occasional cough,no shortness of breath, or wheezing   Cardiovascular: no chest pain   Gastrointestinal: no abdominal pain,no N/V/D  Genito-Urinary: no dysuria, trouble voiding, or hematuria   Musculoskeletal: no edema  Neurological: c/o generalized weakness   Dermatological: no rash, no ulcer    Physical Examination:     Blood pressure 119/63, pulse 71, temperature 98.7 F (37.1 C), temperature source Oral, resp. rate 18, height 1.651 m (5\' 5" ), weight 50.349 kg (111 lb), SpO2 98.00%.     General Appearance: Comfortable, and in no acute distress. Awake,alert, no new issues    HEENT: Pupils are equal, round, and reactive to light.    Lungs: CTA   Heart: RRR   Chest: Symmetric chest wall expansion.    Abdomen: soft ,non tender,no hepatosplenomegaly   Neurological: No focal deficit   Extremities: No edema    Laboratory And Diagnostic Studies:     Recent Labs   [name] - [name] 06/28/12 0659 06/27/12 0510    WBC 0.74* 1.20*    HGB 8.8* 9.9*    HCT 25.3* 28.5*    PLT 74* 89*     No results found for this basename: NA:2,K:2,CL:2,CO2:2,BUN:2,CREAT:2,GLU:2,CA:2 in the last 72 hours  No results found for this basename: AST:2,ALT:2,ALKPHOS:2,PROT:2,ALB:2 in the last 72 hours    Current Meds:      Scheduled Meds: PRN Meds:           cefTRIAXone 2 g Intravenous QHS   enoxaparin 40 mg Subcutaneous Q24H   levofloxacin 500 mg Intravenous Q24H        Continuous Infusions:         acetaminophen 650 mg Q4H PRN   ALPRAZolam 0.25 mg QHS PRN   morphine 2 mg Q2H PRN   naloxone 0.2 mg PRN         [name] A. [name], M.D.  06/29/2012  8:18 AM

## 2012-06-29 NOTE — Plan of Care (Signed)
Problem: Pain  Goal: Patient's pain/discomfort is manageable  Outcome: Progressing  Pt denies pain. Will continue to monitor    Problem: Infection/Potential for Infection  Goal: Free from infection  Outcome: Progressing  Isolation precautions maintained. Pt out of unit for procedure this pm. Low grade temp noted this pm. Tylenol given.    Continue with isolation protocol. Monitor temp.    Comments:   Diarrhea reported. Stool obtained for C. Dif. Pt already on isolation. Diet resumed as ordered. Pt S/P biopsy at this time. Dressing to L lower back CDI.    Will continue to monitor pt.

## 2012-06-29 NOTE — H&P (Signed)
[facility]  [facility]          I have evaluated the patient and agree with the above documented evaluation and plan.    Signed by: Alleyah Twombly M Rhea Kaelin, MD  [facility] Department  [phone]

## 2012-06-29 NOTE — H&P (Signed)
[facility]  [facility]   History and Physical     History and Physical:   Asked by Dr. Adela Lank to see patient for a diagnostic bone marrow biopsy. [name] is a 57 y/o with neutropenic fever, pancytopenia and weight loss.   The patient was interviewed and examined in the preoperative holding area. The previously documented history and physical were reviewed in detail and changes are none.    ASA Classification:   []    ASA 1  Healthy patient  []    ASA 2  Mild systemic illness  [x]    ASA 3  Systemic disease, though not incapacitating  []    ASA 4  Severe systemic disease that is a constant threat to life   []    ASA 5  Moribund condition, patient unexpected to live >24 hours, irrespective of procedure  []    E         Emergent procedure    Mallampati Score:   []  1  [x]  2  []  3  []  4  []  Intubated  []  Tracheostomy    Planned Anesthesia:   []  No sedation  [x]  Local  [x]  Moderate sedation  []  Deep sedation (with Anesthesiology present)  []  General Anesthesia     Airway Assesment:   [x]  Normal  [] Compromised    Diagnosis: Pancytopenia   Procedure: Bone marrow biopsy    The patient is medically stable and appropriate to undergo the planned procedure. All risks and alternatives were discussed with the patient and she agrees to proceed.     Signed by: Lucrezia Europe, NP  CVIR Department  [phone]

## 2012-06-30 LAB — SEDIMENTATION RATE: Sed Rate: 43 — ABNORMAL HIGH (ref 0–20)

## 2012-06-30 MED ORDER — DOXYCYCLINE MONOHYDRATE 100 MG PO CAPS
100.00 mg | ORAL_CAPSULE | Freq: Two times a day (BID) | ORAL | Status: DC
Start: 2012-06-30 — End: 2012-07-04
  Administered 2012-06-30 – 2012-07-04 (×7): 100 mg via ORAL
  Filled 2012-06-30 (×9): qty 1

## 2012-06-30 NOTE — Progress Notes (Signed)
Infectious Diseases & Tropical Medicine  Progress Note    06/30/2012   [name] LOV:[id number] is a 57 y.o. female,       Assessment:     Febrile neutropenia  Rash  Blood cultures -NGTD  Pancytopenia   Rule out malignancy  Cough improving  Recent trip to [place]  R/O anaplasmosis ,ehrlichiosis,RMSF     Plan:      Discontinue current  Antibiotics   Order lyme serology,rickettsial antibody pannel,ehrlichiosis serologies   D/W pathology to obtain peripheral smear looking for anaplasmosis   Empirically start doxycycline    Monitor clinically   Monitor CBC   Oncology following   S/P bone marrow biopsy and cytogenetics-await results    D/W Dr. Mylo Red   Reviewed notes    ROS:     General:  periodic fevers, no chills, no rigor,awake,alert  HEENT: no neck pain, no throat pain,sinus congestion improved  Endocrine: c/o fatigue   Respiratory: occasional cough,no shortness of breath, or wheezing   Cardiovascular: no chest pain   Gastrointestinal: no abdominal pain,no N/V/D  Genito-Urinary: no dysuria, trouble voiding, or hematuria   Musculoskeletal: no edema  Neurological: c/o generalized weakness   Dermatological: maculopapular rash on the back, no ulcer    Physical Examination:     Blood pressure 121/58, pulse 77, temperature 97.8 F (36.6 C), temperature source Axillary, resp. rate 18, height 1.651 m (5\' 5" ), weight 50.349 kg (111 lb), SpO2 97.00%.     General Appearance: Comfortable, and in no acute distress. Awake,alert,sitting in the bed    HEENT: Pupils are equal, round, and reactive to light.    Lungs: CTA   Heart: RRR   Chest: Symmetric chest wall expansion.    Abdomen: soft ,non tender,no hepatosplenomegaly   Neurological: No focal deficit   Extremities: No edema    Laboratory And Diagnostic Studies:     Recent Labs   [facility] 06/29/12 0608 06/28/12 0659    WBC 0.83* 0.74*    HGB 9.8* 8.8*    HCT 28.6* 25.3*    PLT 85* 74*     No results found for this basename:  NA:2,K:2,CL:2,CO2:2,BUN:2,CREAT:2,GLU:2,CA:2 in the last 72 hours  No results found for this basename: AST:2,ALT:2,ALKPHOS:2,PROT:2,ALB:2 in the last 72 hours    Current Meds:      Scheduled Meds: PRN Meds:           [COMPLETED] bupivacaine (PF)      cefTRIAXone 2 g Intravenous QHS   enoxaparin 40 mg Subcutaneous Q24H   [COMPLETED] fentaNYL      [COMPLETED] fentaNYL      levofloxacin 500 mg Intravenous Q24H   [COMPLETED] lidocaine      [COMPLETED] midazolam      [COMPLETED] midazolam          Continuous Infusions:         acetaminophen 650 mg Q4H PRN   ALPRAZolam 0.25 mg QHS PRN   [COMPLETED] fentaNYL  Code/Trauma Med   [COMPLETED] fentaNYL  Code/Trauma Med   [COMPLETED] midazolam  Code/Trauma Med   [COMPLETED] midazolam  Code/Trauma Med   morphine 2 mg Q2H PRN   naloxone 0.2 mg PRN         [name] A. Janalyn Rouse, M.D.  06/30/2012  9:19 AM

## 2012-06-30 NOTE — Plan of Care (Signed)
Problem: Protective Precautions [in description add neutropenic precautions]  Goal: Free from nosocomial infections  Outcome: Progressing  Neutropenic precautions maintained. Continues to have fevers on and off. Tylenol given per order. Pt c/o red spotted rash isolated to back only. MD notified and examined patient. Will monitor rash for any change. IV abx continued. Will continue to monitor and assess pt for any status change. Call light in reach.

## 2012-06-30 NOTE — Progress Notes (Signed)
Daily PROGRESS NOTE    Date Time: 06/30/2012 7:42 PM  Patient Name: [name], [name]  Patient Status: Inpatient  Hospital Day: 5    Assessment:   1. Neutropenic fever  2. Pancytopenia.   3. Weight loss with a body mass index of 18 shows mild to moderate   Malnutrition  4. Skin rash.      Plan:   1. Emperic Doxy  2. S/p Bone Marrow  3. Monitor rash next 24 hrs  4. Stool c.diff toxin is negative    Subjective:   Aches, fatigue, Insomnia, Skin rash on the back  10 point Review of Systems - Negative except for the Positives mentioned above      Medications:     Current Facility-Administered Medications   Medication Dose Route Frequency   . doxycycline  100 mg Oral Q12H SCH   . enoxaparin  40 mg Subcutaneous Q24H   . [DISCONTINUED] cefTRIAXone  2 g Intravenous QHS   . [DISCONTINUED] levofloxacin  500 mg Intravenous Q24H          acetaminophen 650 mg Q4H PRN   ALPRAZolam 0.25 mg QHS PRN   morphine 2 mg Q2H PRN   naloxone 0.2 mg PRN        Physical Exam:     Filed Vitals:    06/30/12 1426   BP: 106/61   Pulse: 84   Temp: 100.4 F (38 C)   Resp: 18   SpO2: 99%       Intake and Output Summary (Last 24 hours) at Date Time  No intake or output data in the 24 hours ending 06/30/12 1942    Physical Exam   Constitutional: She is oriented to person, place, and time. She appears well-developed.   HENT:   Head: Normocephalic and atraumatic.   Eyes: Pupils are equal, round, and reactive to light.   Neck: No JVD present. No tracheal deviation present. No thyromegaly present.   Cardiovascular: Normal rate and normal heart sounds.    Pulmonary/Chest: No respiratory distress. She has no wheezes. She has no rales.   Abdominal: She exhibits no distension. There is no tenderness. There is no rebound and no guarding.   Musculoskeletal: She exhibits no edema.   Neurological: She is alert and oriented to person, place, and time.   Erythematous Maculopapular rash on upper back          Labs:     Results     Procedure Component Value  Units Date/Time    Lyme Ab Tot Rflx to [facility] IGG/IGM [[id number]] Collected:06/30/12 1805    Specimen Information:Blood Updated:06/30/12 1910    Ehrlichia chaffeensis Aby IgG IgM [[id number]] Collected:06/30/12 1805     Updated:06/30/12 1910    Rickettsia Aby Panel with Rflx Titers [[id number]] Collected:06/30/12 1805     Updated:06/30/12 1910    ANA PANEL [[id number]] Collected:06/30/12 1146     Updated:06/30/12 1506    Sedimentation rate (ESR) [[id number]]  (Abnormal) Collected:06/30/12 1146    Specimen Information:Blood Updated:06/30/12 1201     Sed Rate 43 (H)     Proteinase-3 Antibody [[id number]] Collected:06/30/12 1146     Updated:06/30/12 1153    Narrative:    Fasting specimen    Parvovirus B19 IgG & IgM [[id number]] Collected:06/30/12 1146     Updated:06/30/12 1153    Narrative:    Keep at room temp    EBV early antigen antibody, IgG [[id number]] Collected:06/30/12 1146    Specimen Information:Blood Updated:06/30/12 1153    Blood Culture #1 [[id number]]  Collected:06/25/12 2241    Specimen Information:Blood / Blood Updated:06/30/12 0221    Narrative:    ORDER#: [id number]                                    ORDERED BY: THOMAS, SASHA  SOURCE: Blood arm                                    COLLECTED:  06/25/12 22:41  ANTIBIOTICS AT COLL.:                                RECEIVED :  06/26/12 01:43  Culture Blood                              PRELIM      06/30/12 02:21  06/27/12   No Growth after 1 day/s of incubation.  06/28/12   No Growth after 2 day/s of incubation.  06/29/12   No Growth after 3 day/s of incubation.  06/30/12   No Growth after 4 day/s of incubation.      Blood Culture #2 [[id number]] Collected:06/25/12 2221    Specimen Information:Blood / Blood Updated:06/30/12 0221    Narrative:    ORDER#: [id number]                                    ORDERED BY: Audley Hose  SOURCE: Blood arm                                    COLLECTED:  06/25/12 22:21  ANTIBIOTICS AT COLL.:                                RECEIVED :   06/26/12 01:43  Culture Blood                              PRELIM      06/30/12 02:21  06/27/12   No Growth after 1 day/s of incubation.  06/28/12   No Growth after 2 day/s of incubation.  06/29/12   No Growth after 3 day/s of incubation.  06/30/12   No Growth after 4 day/s of incubation.      Clostridium difficile toxin [[id number]] Collected:06/29/12 1208    Specimen Information:Stool / Stool Updated:06/29/12 2019    Narrative:    ORDER#: [id number]                                    ORDERED BY: Tobie Lords  SOURCE: Stool                                        COLLECTED:  06/29/12 12:08  ANTIBIOTICS AT COLL.:  RECEIVED :  06/29/12 16:30  C Difficile Toxin A and B                  FINAL       06/29/12 20:18  06/29/12   Negative for Toxigenic C. difficile             Test performed using Illumigene DNA amplification assay.             Testing more than once in 7 days is unnecessary and not             recommended for this sensitive DNA test method.             Note: Testing will not be performed on formed stools.              Rads:     Xr Chest 2 Views    06/26/2012   CLINICAL INDICATION: pneumonia  COMPARISON: None available  FINDINGS:   2 views of the chest were obtained. The lungs are clear. The heart and vascularity are within normal limits.  There is no pleural thickening or effusion. The osseous structures are unremarkable.      06/26/2012   No active cardiopulmonary disease   Heron Nay, MD  06/26/2012 11:12 PM     Chest 2 Views    06/25/2012  CLINICAL INDICATION: Fever  COMPARISON: None.  INTERPRETATION: Frontal and lateral views of the chest obtained. Cardiomediastinal contour within normal limits for age. Clear lungs and sharp sulci.           06/25/2012   No acute cardiopulmonary process.  Filbert Schilder, MD  06/25/2012 10:43 PM         Signed by: Eric Form, MD  Pager: [phone]

## 2012-06-30 NOTE — Progress Notes (Signed)
Reason of Consult: Initial Assessment    Emergency Point of Contact:None     Assessment: Writer met w/ pt while providing unit assistance. Pt is a 57 y/o female who resides independently at [facility] [address], [place]. Pt is ambulatory with limited family support. She denied any mental health or substance abuse diagnosis w/ Clinical research associate at this time. Pt has transportation home when medically cleared. No other concerns noted by writer at this time.     D/C Treatment Plan: Assigned SW/CM should continue to assist as needed throughout d/c.       Erroll Luna, LCSW Supervisee  ED Social Worker  [phone]

## 2012-06-30 NOTE — Plan of Care (Signed)
Problem: Infection/Potential for Infection  Goal: Free from infection  Outcome: Progressing  Temp was 97.8  In am then 97.8 in afternoon. At present went up to 100.4, therefore tylenol given to assist in not going up more also for malaise. Pt is pale looking, no acute complaints voiced.States has always been thin and not much appetite. Maintained on neutropenic isolation til wbcs go up . Optimal comfort to be maintained.

## 2012-06-30 NOTE — Progress Notes (Addendum)
HEMATOLOGY ONCOLOGY PROGRESS NOTE    Date Time: 06/30/2012 9:45 AM  Patient Name: [name]      IMPRESSION:   1- Neutropenic fever.  Suspect infectious etiology.   2- Failure to thrive, weight loss.  3- Skin rash  4- Diarrhea  5- Chondromalacia Patellae.    PLAN:    Awaiting the result of her bone marrow biopsy/aspirate.  Rule out connective tissue disorders, vasculitis, viral infectious etiology.   Antibiotics as per ID. Send stools for culture.    HISTORY:    [name] who was in her usual state of excellent health until approximately 5 days ago when she began to experience arthralgias, myalgias, and some rhinorrhea. She was noted to have temperature elevations to as high as 102 and 103 degrees.  It was recommended that she report to the emergency room where she was discovered to have a temperature of 102 degrees. A CBC at the time of admission revealed a WBC of 0.84,  hemoglobin 11.4, hematocrit 34.2 and a platelet count of 107. Her absolute neutrophil count was 0.39.    INTERVAL HISTORY:    She experienced a temperature to 101.  She feels a little better. She has developed diarrhea yesterday.  NO SOB, back or chest pain.   A bone marrow biopsy was done yesterday.    PHYSICAL EXAM:     Filed Vitals:    06/30/12 0856   BP: 121/58   Pulse: 77   Temp: 97.8 F (36.6 C)   Resp: 18   SpO2: 97%     Intake and Output Summary (Last 24 hours) at Date Time  No intake or output data in the 24 hours ending 06/30/12 0945    Appearance: in no acute distress  HEENT: pharynx clear  Lungs: clear to ausculation  Heart: regular rate and rhythm, normal heart sounds  Abdomen: flat and soft, active bowel sounds  Extremities: no edema  Neuro: alert, appropriate    MEDS:   Scheduled Meds:  Current Facility-Administered Medications   Medication Dose Route Frequency   . [COMPLETED] bupivacaine (PF)       . cefTRIAXone  2 g Intravenous QHS   . enoxaparin  40 mg Subcutaneous Q24H   . [COMPLETED] fentaNYL       .  [COMPLETED] fentaNYL       . levofloxacin  500 mg Intravenous Q24H   . [COMPLETED] lidocaine       . [COMPLETED] midazolam       . [COMPLETED] midazolam         Continuous Infusions:   PRN Meds:.acetaminophen, ALPRAZolam, [COMPLETED] fentaNYL, [COMPLETED] fentaNYL, [COMPLETED] midazolam, [COMPLETED] midazolam, morphine, naloxone    LABS:     Results     Procedure Component Value Units Date/Time    Blood Culture #1 [[id number]] Collected:06/25/12 2241    Specimen Information:Blood / Blood Updated:06/30/12 0221    Narrative:    ORDER#: [id number]                                    ORDERED BY: Audley Hose  SOURCE: Blood arm                                    COLLECTED:  06/25/12 22:41  ANTIBIOTICS AT COLL.:  RECEIVED :  06/26/12 01:43  Culture Blood                              PRELIM      06/30/12 02:21  06/27/12   No Growth after 1 day/s of incubation.  06/28/12   No Growth after 2 day/s of incubation.  06/29/12   No Growth after 3 day/s of incubation.  06/30/12   No Growth after 4 day/s of incubation.      Blood Culture #2 [[id number]] Collected:06/25/12 2221    Specimen Information:Blood / Blood Updated:06/30/12 0221    Narrative:    ORDER#: [id number]                                    ORDERED BY: Audley Hose  SOURCE: Blood arm                                    COLLECTED:  06/25/12 22:21  ANTIBIOTICS AT COLL.:                                RECEIVED :  06/26/12 01:43  Culture Blood                              PRELIM      06/30/12 02:21  06/27/12   No Growth after 1 day/s of incubation.  06/28/12   No Growth after 2 day/s of incubation.  06/29/12   No Growth after 3 day/s of incubation.  06/30/12   No Growth after 4 day/s of incubation.      Clostridium difficile toxin [[id number]] Collected:06/29/12 1208    Specimen Information:Stool / Stool Updated:06/29/12 2019    Narrative:    ORDER#: [id number]                                    ORDERED BY: Tobie Lords  SOURCE: Stool                                         COLLECTED:  06/29/12 12:08  ANTIBIOTICS AT COLL.:                                RECEIVED :  06/29/12 16:30  C Difficile Toxin A and B                  FINAL       06/29/12 20:18  06/29/12   Negative for Toxigenic C. difficile             Test performed using Illumigene DNA amplification assay.             Testing more than once in 7 days is unnecessary and not             recommended for this sensitive DNA test method.             Note: Testing will not be performed  on formed stools.      CBC and differential [[id number]]  (Abnormal) Collected:06/25/12 2221    Specimen Information:Blood / Blood Updated:06/29/12 1626     WBC 0.84 (L)      RBC 3.55 (L)      Hgb 11.4 (L) g/dL      Hematocrit 40.9 (L) %      MCV 96.3 fL      MCH 32.1 (H) pg      MCHC 33.3 g/dL      RDW 14 %      Platelets 107 (L)      MPV 9.9 fL     CBC with differential [[id number]]  (Abnormal) Collected:06/27/12 0510    Specimen Information:Blood / Blood Updated:06/29/12 1626     WBC 1.20 (L)      RBC 3.01 (L)      Hgb 9.9 (L) g/dL      Hematocrit 95.6 (L) %      MCV 94.7 fL      MCH 32.9 (H) pg      MCHC 34.7 g/dL      RDW 14 %      Platelets 89 (L)      MPV 10.6 fL     CBC with differential [[id number]]  (Abnormal) Collected:06/28/12 0659    Specimen Information:Blood / Blood Updated:06/29/12 1626     WBC 0.74 (L)      RBC 2.72 (L)      Hgb 8.8 (L) g/dL      Hematocrit 46.9 (L) %      MCV 93.0 fL      MCH 32.4 (H) pg      MCHC 34.8 g/dL      RDW 14 %      Platelets 74 (L)      MPV 9.9 fL     CBC with differential [[id number]]  (Abnormal) Collected:06/29/12 0608    Specimen Information:Blood / Blood Updated:06/29/12 1626     WBC 0.83 (L)      RBC 2.98 (L)      Hgb 9.8 (L) g/dL      Hematocrit 24.4 (L) %      MCV 96.0 fL      MCH 32.9 (H) pg      MCHC 34.3 g/dL      RDW 13 %      Platelets 85 (L)      MPV 10.3 fL         IMAGING DATA:  Xr Chest 2 Views    06/26/2012   CLINICAL INDICATION: pneumonia  COMPARISON: None available   FINDINGS:   2 views of the chest were obtained. The lungs are clear. The heart and vascularity are within normal limits.  There is no pleural thickening or effusion. The osseous structures are unremarkable.      06/26/2012   No active cardiopulmonary disease   Heron Nay, MD  06/26/2012 11:12 PM     Chest 2 Views    06/25/2012  CLINICAL INDICATION: Fever  COMPARISON: None.  INTERPRETATION: Frontal and lateral views of the chest obtained. Cardiomediastinal contour within normal limits for age. Clear lungs and sharp sulci.           06/25/2012   No acute cardiopulmonary process.  Filbert Schilder, MD  06/25/2012 10:43 PM      Sharmon Revere, MD  [facility]

## 2012-07-01 LAB — CBC AND DIFFERENTIAL
Hematocrit: 30.7 % — ABNORMAL LOW (ref 37.0–47.0)
Hgb: 10.2 g/dL — ABNORMAL LOW (ref 12.0–16.0)
MCH: 32.1 pg — ABNORMAL HIGH (ref 28.0–32.0)
MCHC: 33.2 g/dL (ref 32.0–36.0)
MCV: 96.5 fL (ref 80.0–100.0)
MPV: 10.5 fL (ref 9.4–12.3)
Platelets: 125 — ABNORMAL LOW (ref 140–400)
RBC: 3.18 — ABNORMAL LOW (ref 4.20–5.40)
RDW: 13 % (ref 12–15)
WBC: 1.28 — ABNORMAL LOW (ref 3.50–10.80)

## 2012-07-01 LAB — ANA PANEL
ANA Qualitative: NEGATIVE
ANA RNP: 8 (ref 0–99)
Anti-DNA (DS) Antibody Quantitative: 2 (ref 0–99)
Centromere Interpretation: NEGATIVE
Centromere: 1 (ref 0–99)
Histone Interpretation: NEGATIVE
Histone: 1 (ref 0–99)
Jo-1 Interpretation: NEGATIVE
Jo-1: 17 (ref 0–99)
Ribonucleoprotein (RNP) Antibody Interpretation: NEGATIVE
SM Interpretation: NEGATIVE
SM: 7 (ref 0–99)
SSA Interpretation: NEGATIVE
Scleroderma (Scl-70) Antibody Interpretation: NEGATIVE
Scleroderma SCL-70: 10 (ref 0–99)
Sjogrens SSA (Ro) Antibody: 4 (ref 0–99)
Sjogrens SSB (La) Antibody Interpretation: NEGATIVE
Sjogrens SSB (La) Antibody: 4 (ref 0–99)
dsDNA Interpretation: NEGATIVE

## 2012-07-01 LAB — MAN DIFF ONLY
Band Neutrophils Absolute: 0 (ref 0.00–1.00)
Band Neutrophils: 0 %
Basophils Absolute Manual: 0 (ref 0.00–0.20)
Basophils Manual: 0 %
Eosinophils Absolute Manual: 0 (ref 0.00–0.70)
Eosinophils Manual: 0 %
Lymphocytes Absolute Manual: 0.99 (ref 0.50–4.40)
Lymphocytes Manual: 77 %
Monocytes Absolute: 0.08 (ref 0.00–1.20)
Monocytes Manual: 6 %
Neutrophils Absolute Manual: 0.22 — ABNORMAL LOW (ref 1.80–8.10)
Nucleated RBC: 0 (ref 0–1)
Segmented Neutrophils: 17 %

## 2012-07-01 LAB — EPSTEIN-BARR VIRUS EARLY ANTIGEN ANTIBODY, IGG: EBV EA-D Ab (IgG): <=0.9 (ref <=0.90)

## 2012-07-01 LAB — PARVOVIRUS ANTIBODIES B19, ACUTE PANEL (SOFT)
Parvovirus AB B19, IgG: 1.7 — ABNORMAL HIGH (ref <0.9)
Parvovirus Ab B19, IgM: 0.1 (ref <0.9)

## 2012-07-01 LAB — CELL MORPHOLOGY: Cell Morphology: NORMAL

## 2012-07-01 LAB — PROTEINASE-3 AB: Proteinase 3 AB: <1 (ref <1.0)

## 2012-07-01 NOTE — Progress Notes (Signed)
Infectious Diseases & Tropical Medicine  Progress Note    07/01/2012   [name] RUE:[id number],NWG:[id number] is a 57 y.o. female,       Assessment:     Febrile neutropenia improving  Pancytopenia improving  Rash likely contact dermatitis  Blood cultures -NGTD  Rule out malignancy  Cough improving  Recent trip to [place]  R/O anaplasmosis ,ehrlichiosis,RMSF  Clinically improved     Plan:        Continue doxycycline   Lyme serology,rickettsial antibody pannel,ehrlichiosis serologies pending   D/W pathology - peripheral smear unremarkable   Preliminary path. results unremarkable (verbal report)   Monitor clinically   Monitor CBC   Oncology following   S/P bone marrow biopsy and cytogenetics-await final results   D/W Dr. Roderic Ovens   Reviewed notes    ROS:     General:  periodic fevers, no chills, no rigor,awake,alert  HEENT: no neck pain, no throat pain,sinus congestion improved  Endocrine: no fatigue   Respiratory: occasional cough,no shortness of breath, or wheezing   Cardiovascular: no chest pain   Gastrointestinal: no abdominal pain,no N/V/D  Genito-Urinary: no dysuria, trouble voiding, or hematuria   Musculoskeletal: no edema  Neurological: c/o generalized weakness   Dermatological: maculopapular rash on the back stable, no ulcer    Physical Examination:     Blood pressure 126/66, pulse 73, temperature 99 F (37.2 C), temperature source Oral, resp. rate 18, height 1.651 m (5\' 5" ), weight 50.349 kg (111 lb), SpO2 97.00%.     General Appearance: Comfortable, and in no acute distress. Awake,alert. Feeling better   HEENT: Pupils are equal, round, and reactive to light.    Lungs: CTA   Heart: RRR   Chest: Symmetric chest wall expansion.    Abdomen: soft ,non tender,no hepatosplenomegaly   Neurological: No focal deficit   Extremities: No edema    Laboratory And Diagnostic Studies:     Recent Labs   [facility] 07/01/12 0425 06/29/12 0608    WBC 1.28* 0.83*    HGB 10.2* 9.8*    HCT 30.7* 28.6*    PLT 125* 85*      No results found for this basename: NA:2,K:2,CL:2,CO2:2,BUN:2,CREAT:2,GLU:2,CA:2 in the last 72 hours  No results found for this basename: AST:2,ALT:2,ALKPHOS:2,PROT:2,ALB:2 in the last 72 hours    Current Meds:      Scheduled Meds: PRN Meds:           doxycycline 100 mg Oral Q12H SCH   enoxaparin 40 mg Subcutaneous Q24H   [DISCONTINUED] cefTRIAXone 2 g Intravenous QHS   [DISCONTINUED] levofloxacin 500 mg Intravenous Q24H       Continuous Infusions:         acetaminophen 650 mg Q4H PRN   ALPRAZolam 0.25 mg QHS PRN   morphine 2 mg Q2H PRN   naloxone 0.2 mg PRN         [name] A. Janalyn Rouse, M.D.  07/01/2012  8:46 AM

## 2012-07-01 NOTE — Plan of Care (Signed)
Problem: Protective Precautions [in description add neutropenic precautions]  Goal: Free from nosocomial infections  Outcome: Progressing  Pt spiked a low grade fever of 100.1 overnight. Tylenol given with effective relief. No other complaints voiced. AM labs drawn. Pt appears to be resting comfortably at this time. Neutropenic precautions maintained.Will continue to monitor and assess for any status change. Call light in reach.

## 2012-07-01 NOTE — Progress Notes (Addendum)
HEMATOLOGY ONCOLOGY PROGRESS NOTE    Date Time: 07/01/2012 8:48 AM  Patient Name: [name],[name]      IMPRESSION:   1- Neutropenic fever.  Still spiking low grade fever.  Favor infectious etiology. Work up in progress.   2- Diarrhea.  Will send stools.  3- Failure to thrive, weight loss.  4- Skin rash.  5- Chondromalacia Patellae    PLAN:    1- Follow the result of her bone marrow biopsy/aspirate.    2- Follow labs, including Ehrlichia, Rickettsiae, and viral serology.  3- Send stools.  4- Continue empiric antibiotics.    HISTORY:    [name] is a 57 year old who was in her usual state of excellent health until approximately 5 days ago when she began to experience arthralgias, myalgias, and some rhinorrhea. She was noted to have temperature   elevations to as high as 102 and 103 degrees.  It was recommended that she report to the emergency room where she was discovered to have a temperature of 102 degrees.  A CBC at the time of admission revealed a WBC of 0.84,   hemoglobin 11.4, hematocrit 34.2 and a platelet count of 107.  Her absolute neutrophil count was 0.39.    INTERVAL HISTORY:    She "spiked" a temperature of 100.1 last night.  Overall, feeling the same.  Has had diarrhea. NO SOB, back or chest pain.     PHYSICAL EXAM:     Filed Vitals:    07/01/12 0753   BP: 126/66   Pulse: 73   Temp: 99 F (37.2 C)   Resp: 18   SpO2: 97%     Appearance: Ill, but in no acute distress  HEENT: Pharynx clear  Lungs: Clear to ausculation  Heart: Regular rate and rhythm, normal heart sounds  Abdomen: Flat and soft, active bowel sounds  Extremities: No edema  Neuro: Alert, appropriate    MEDS:   Scheduled Meds:  Current Facility-Administered Medications   Medication Dose Route Frequency   . doxycycline  100 mg Oral Q12H SCH   . enoxaparin  40 mg Subcutaneous Q24H   . [DISCONTINUED] cefTRIAXone  2 g Intravenous QHS   . [DISCONTINUED] levofloxacin  500 mg Intravenous Q24H     Continuous Infusions:   PRN Meds:.acetaminophen,  ALPRAZolam, morphine, naloxone    LABS:     Results     Procedure Component Value Units Date/Time    Manual Differential [[id number]]  (Abnormal) Collected:07/01/12 0425     Segmented Neutrophils - 17 % Updated:07/01/12 0718     Band Neutrophils 0 %      Lymphocytes Manual 77 %      Monocytes Manual 6 %      Eosinophils Manual 0 %      Basophils Manual 0 %      Nucleated RBC 0      Abs Seg Manual 0.22 (L)      Bands Absolute 0.00      Absolute Lymph Manual 0.99      Monocytes Absolute 0.08      Absolute Eos Manual 0.00      Absolute Baso Manual 0.00     Cell MorpHology [[id number]] Collected:07/01/12 0425     Cell Morphology: Normal Updated:07/01/12 0718    CBC and differential [[id number]]  (Abnormal) Collected:07/01/12 0425    Specimen Information:Blood / Blood Updated:07/01/12 0718     WBC 1.28 (L)      RBC 3.18 (L)      Hgb 10.2 (  L) g/dL      Hematocrit 86.5 (L) %      MCV 96.5 fL      MCH 32.1 (H) pg      MCHC 33.2 g/dL      RDW 13 %      Platelets 125 (L)      MPV 10.5 fL     Blood Culture #2 [[id number]] Collected:06/25/12 2221    Specimen Information:Blood / Blood Updated:07/01/12 0421    Narrative:    ORDER#: [id number]                                    ORDERED BY: Audley Hose  SOURCE: Blood arm                                    COLLECTED:  06/25/12 22:21  ANTIBIOTICS AT COLL.:                                RECEIVED :  06/26/12 01:43  Culture Blood                              FINAL       07/01/12 04:21  07/01/12   No growth after 5 days of incubation.      Blood Culture #1 [[id number]] Collected:06/25/12 2241    Specimen Information:Blood / Blood Updated:07/01/12 0421    Narrative:    ORDER#: [id number]                                    ORDERED BY: Audley Hose  SOURCE: Blood arm                                    COLLECTED:  06/25/12 22:41  ANTIBIOTICS AT COLL.:                                RECEIVED :  06/26/12 01:43  Culture Blood                              FINAL       07/01/12 04:21  07/01/12   No  growth after 5 days of incubation.      Lyme Ab Tot Rflx to [facility] IGG/IGM [[id number]] Collected:06/30/12 1805    Specimen Information:Blood Updated:06/30/12 1910    Ehrlichia chaffeensis Aby IgG IgM [[id number]] Collected:06/30/12 1805     Updated:06/30/12 1910    Rickettsia Aby Panel with Rflx Titers [[id number]] Collected:06/30/12 1805     Updated:06/30/12 1910    ANA PANEL [[id number]] Collected:06/30/12 1146     Updated:06/30/12 1506    Sedimentation rate (ESR) [[id number]]  (Abnormal) Collected:06/30/12 1146    Specimen Information:Blood Updated:06/30/12 1201     Sed Rate 43 (H)     Proteinase-3 Antibody [[id number]] Collected:06/30/12 1146     Updated:06/30/12 1153    Narrative:    Fasting specimen    Parvovirus B19 IgG & IgM [[id number]] Collected:06/30/12 1146  Updated:06/30/12 1153    Narrative:    Keep at room temp    EBV early antigen antibody, IgG [[id number]] Collected:06/30/12 1146    Specimen Information:Blood Updated:06/30/12 1153          IMAGING DATA:  Xr Chest 2 Views    06/26/2012   CLINICAL INDICATION: pneumonia  COMPARISON: None available  FINDINGS:   2 views of the chest were obtained. The lungs are clear. The heart and vascularity are within normal limits.  There is no pleural thickening or effusion. The osseous structures are unremarkable.      06/26/2012   No active cardiopulmonary disease   Heron Nay, MD  06/26/2012 11:12 PM     Chest 2 Views    06/25/2012  CLINICAL INDICATION: Fever  COMPARISON: None.  INTERPRETATION: Frontal and lateral views of the chest obtained. Cardiomediastinal contour within normal limits for age. Clear lungs and sharp sulci.           06/25/2012   No acute cardiopulmonary process.  Filbert Schilder, MD  06/25/2012 10:43 PM      Sharmon Revere, MD  [facility]

## 2012-07-01 NOTE — Progress Notes (Signed)
Daily PROGRESS NOTE    Date Time: 07/01/2012 3:22 PM  Patient Name: [name], [name]  Patient Status: Inpatient  Hospital Day: 6    Assessment:   1. Neutropenic fever  2. Pancytopenia.   3. Weight loss with a body mass index of 18 shows mild to moderate   Malnutrition  4. Skin rash.      Plan:   1. Emperic Doxy  2. Await Bone Marrow result  3. Monitor rash next 24 hrs  4. Stool c.diff toxin is negative    Subjective:   Aches, fatigue, Insomnia, Skin rash on the back  10 point Review of Systems - Negative except for the Positives mentioned above      Medications:     Current Facility-Administered Medications   Medication Dose Route Frequency   . doxycycline  100 mg Oral Q12H SCH   . enoxaparin  40 mg Subcutaneous Q24H   . [DISCONTINUED] cefTRIAXone  2 g Intravenous QHS   . [DISCONTINUED] levofloxacin  500 mg Intravenous Q24H          acetaminophen 650 mg Q4H PRN   ALPRAZolam 0.25 mg QHS PRN   morphine 2 mg Q2H PRN   naloxone 0.2 mg PRN        Physical Exam:     Filed Vitals:    07/01/12 0753   BP: 126/66   Pulse: 73   Temp: 99 F (37.2 C)   Resp: 18   SpO2: 97%       Intake and Output Summary (Last 24 hours) at Date Time  No intake or output data in the 24 hours ending 07/01/12 1522    Physical Exam   Constitutional: She is oriented to person, place, and time. She appears well-developed.   HENT:   Head: Normocephalic and atraumatic.   Eyes: Pupils are equal, round, and reactive to light.   Neck: No JVD present. No tracheal deviation present. No thyromegaly present.   Cardiovascular: Normal rate and normal heart sounds.    Pulmonary/Chest: No respiratory distress. She has no wheezes. She has no rales.   Abdominal: She exhibits no distension. There is no tenderness. There is no rebound and no guarding.   Musculoskeletal: She exhibits no edema.   Neurological: She is alert and oriented to person, place, and time.   Erythematous Maculopapular rash on upper back          Labs:     Results     Procedure Component  Value Units Date/Time    ANA PANEL [[id number]] Collected:06/30/12 1146     ANA Qualitative Negative Updated:07/01/12 1056     SSA 4      SSA Interp Negative      SSB 4      SSB Interp Negative      Sm 7      Sm Interp Negative      ANA RNP 8      RNP Interp Negative      Scleroderma SCL-70 10      Scl-70 Interp Negative      Jo-1 17      Jo-1 Interp Negative      Anti-DNA (DS) Ab Qn 2      dsDNA Interp Negative      Centromere 1      Centromere Interp Negative      Histone 1      Histone Interp Negative     Manual Differential [[id number]]  (Abnormal) Collected:07/01/12 0425  Segmented Neutrophils - 17 % Updated:07/01/12 0718     Band Neutrophils 0 %      Lymphocytes Manual 77 %      Monocytes Manual 6 %      Eosinophils Manual 0 %      Basophils Manual 0 %      Nucleated RBC 0      Abs Seg Manual 0.22 (L)      Bands Absolute 0.00      Absolute Lymph Manual 0.99      Monocytes Absolute 0.08      Absolute Eos Manual 0.00      Absolute Baso Manual 0.00     Cell MorpHology [[id number]] Collected:07/01/12 0425     Cell Morphology: Normal Updated:07/01/12 0718    CBC and differential [[id number]]  (Abnormal) Collected:07/01/12 0425    Specimen Information:Blood / Blood Updated:07/01/12 0718     WBC 1.28 (L)      RBC 3.18 (L)      Hgb 10.2 (L) g/dL      Hematocrit 01.0 (L) %      MCV 96.5 fL      MCH 32.1 (H) pg      MCHC 33.2 g/dL      RDW 13 %      Platelets 125 (L)      MPV 10.5 fL     Blood Culture #2 [[id number]] Collected:06/25/12 2221    Specimen Information:Blood / Blood Updated:07/01/12 0421    Narrative:    ORDER#: [id number]                                    ORDERED BY: Audley Hose  SOURCE: Blood arm                                    COLLECTED:  06/25/12 22:21  ANTIBIOTICS AT COLL.:                                RECEIVED :  06/26/12 01:43  Culture Blood                              FINAL       07/01/12 04:21  07/01/12   No growth after 5 days of incubation.      Blood Culture #1 [[id number]]  Collected:06/25/12 2241    Specimen Information:Blood / Blood Updated:07/01/12 0421    Narrative:    ORDER#: [id number]                                    ORDERED BY: Audley Hose  SOURCE: Blood arm                                    COLLECTED:  06/25/12 22:41  ANTIBIOTICS AT COLL.:                                RECEIVED :  06/26/12 01:43  Culture Blood  FINAL       07/01/12 04:21  07/01/12   No growth after 5 days of incubation.      Lyme Ab Tot Rflx to [facility] IGG/IGM [[id number]] Collected:06/30/12 1805    Specimen Information:Blood Updated:06/30/12 1910    Ehrlichia chaffeensis Aby IgG IgM [[id number]] Collected:06/30/12 1805     Updated:06/30/12 1910    Rickettsia Aby Panel with Rflx Titers [[id number]] Collected:06/30/12 1805     Updated:06/30/12 1910            Rads:     Xr Chest 2 Views    06/26/2012   CLINICAL INDICATION: pneumonia  COMPARISON: None available  FINDINGS:   2 views of the chest were obtained. The lungs are clear. The heart and vascularity are within normal limits.  There is no pleural thickening or effusion. The osseous structures are unremarkable.      06/26/2012   No active cardiopulmonary disease   Heron Nay, MD  06/26/2012 11:12 PM     Chest 2 Views    06/25/2012  CLINICAL INDICATION: Fever  COMPARISON: None.  INTERPRETATION: Frontal and lateral views of the chest obtained. Cardiomediastinal contour within normal limits for age. Clear lungs and sharp sulci.           06/25/2012   No acute cardiopulmonary process.  Filbert Schilder, MD  06/25/2012 10:43 PM         Signed by: Eric Form, MD  Pager: [phone]

## 2012-07-02 ENCOUNTER — Inpatient Hospital Stay: Payer: 59

## 2012-07-02 LAB — MAN DIFF ONLY
Band Neutrophils Absolute: 0.02 (ref 0.00–1.00)
Band Neutrophils: 1 %
Basophils Absolute Manual: 0 (ref 0.00–0.20)
Basophils Manual: 0 %
Eosinophils Absolute Manual: 0.02 (ref 0.00–0.70)
Eosinophils Manual: 1 %
Lymphocytes Absolute Manual: 1.3 (ref 0.50–4.40)
Lymphocytes Manual: 80 %
Monocytes Absolute: 0.16 (ref 0.00–1.20)
Monocytes Manual: 10 %
Neutrophils Absolute Manual: 0.13 — ABNORMAL LOW (ref 1.80–8.10)
Nucleated RBC: 0 (ref 0–1)
Segmented Neutrophils: 8 %

## 2012-07-02 LAB — PARVOVIRUS ANTIBODIES B19, ACUTE PANEL (SOFT)
Parvovirus AB B19, IgG: 2 — ABNORMAL HIGH (ref <0.9)
Parvovirus Ab B19, IgM: 0.1 (ref <0.9)

## 2012-07-02 LAB — CBC AND DIFFERENTIAL
Hematocrit: 29.1 % — ABNORMAL LOW (ref 37.0–47.0)
Hgb: 10 g/dL — ABNORMAL LOW (ref 12.0–16.0)
MCH: 32.6 pg — ABNORMAL HIGH (ref 28.0–32.0)
MCHC: 34.4 g/dL (ref 32.0–36.0)
MCV: 94.8 fL (ref 80.0–100.0)
MPV: 10.4 fL (ref 9.4–12.3)
Platelets: 135 — ABNORMAL LOW (ref 140–400)
RBC: 3.07 — ABNORMAL LOW (ref 4.20–5.40)
RDW: 13 % (ref 12–15)
WBC: 1.62 — ABNORMAL LOW (ref 3.50–10.80)

## 2012-07-02 LAB — CELL MORPHOLOGY: Cell Morphology: NORMAL

## 2012-07-02 MED ORDER — RISAQUAD PO CAPS
1.0000 | ORAL_CAPSULE | Freq: Every day | ORAL | Status: DC
Start: 2012-07-03 — End: 2012-07-28
  Administered 2012-07-04 – 2012-07-28 (×25): 1 via ORAL
  Filled 2012-07-02 (×25): qty 1

## 2012-07-02 MED ORDER — HEPARIN SODIUM (PORCINE) 1000 UNIT/ML IJ SOLN
INTRAMUSCULAR | Status: AC
Start: 2012-07-02 — End: 2012-07-02
  Administered 2012-07-02: 200 [IU]
  Filled 2012-07-02: qty 10

## 2012-07-02 MED ORDER — TECHNETIUM TC 99M-LABELED RED BLOOD CELLS
23.4000 | Freq: Once | Status: AC | PRN
Start: 2012-07-02 — End: 2012-07-02
  Administered 2012-07-02: 23 via INTRAVENOUS

## 2012-07-02 NOTE — Progress Notes (Signed)
[facility]  [facility]   Progress Note       Received request for a central line placement for chemotherapy administration. Patient is a 57 y/o with recently diagnosed acute leukemia.   Patient off the unit this morning for MUGA scan.  Will plan on line placement tomorrow 07/03/12, clears after midnight, hold AM anticoagulation.

## 2012-07-02 NOTE — Progress Notes (Signed)
Daily PROGRESS NOTE    Date Time: 07/02/2012 7:40 PM  Patient Name: [name], [name]  Patient Status: Inpatient  Hospital Day: 7    Assessment:   Acute Leukemia, NOS   Neutropenic fever  . Pancytopenia.   . Weight loss with a body mass index of 18 shows mild to moderate   Malnutrition  . Skin rash.  Diarrhea      Plan:   1. D/c Doxy if ok with ID  2. Bone Marrow with Blast cells  3. Monitor rash   4. Stool c.diff toxin is negative  5. Chemo, Mediport    Subjective:   Aches, fatigue, Insomnia, Skin rash on the back, Diarrhea  10 point Review of Systems - Negative except for the Positives mentioned above      Medications:     Current Facility-Administered Medications   Medication Dose Route Frequency   . doxycycline  100 mg Oral Q12H SCH   . enoxaparin  40 mg Subcutaneous Q24H   . [COMPLETED] heparin (porcine)              acetaminophen 650 mg Q4H PRN   ALPRAZolam 0.25 mg QHS PRN   morphine 2 mg Q2H PRN   naloxone 0.2 mg PRN   [COMPLETED] Tc65m labeled RBC's 23 milli Curie ONCE PRN        Physical Exam:     Filed Vitals:    07/02/12 1608   BP: 114/59   Pulse: 69   Temp: 98.9 F (37.2 C)   Resp: 18   SpO2: 97%       Intake and Output Summary (Last 24 hours) at Date Time  No intake or output data in the 24 hours ending 07/02/12 1940    Physical Exam   Constitutional: She is oriented to person, place, and time. She appears well-developed.   HENT:   Head: Normocephalic and atraumatic.   Eyes: Pupils are equal, round, and reactive to light.   Neck: No JVD present. No tracheal deviation present. No thyromegaly present.   Cardiovascular: Normal rate and normal heart sounds.    Pulmonary/Chest: No respiratory distress. She has no wheezes. She has no rales.   Abdominal: She exhibits no distension. There is no tenderness. There is no rebound and no guarding.   Musculoskeletal: She exhibits no edema.   Neurological: She is alert and oriented to person, place, and time.   Erythematous Maculopapular rash on upper  back          Labs:     Results     Procedure Component Value Units Date/Time    Leukemia/Lymphoma Evaluation Panel [[id number]] Collected:07/02/12 1647     Updated:07/02/12 1654    Narrative:    Send peripheral blood for flow cytometry    Parvovirus B19 IgG & IgM [[id number]]  (Abnormal) Collected:06/30/12 1146     Parvovirus AB B19, IgG 2.0 (H) Updated:07/02/12 1522     Parvovirus Ab B19, IgM 0.1     CBC and differential [[id number]]  (Abnormal) Collected:07/02/12 0651    Specimen Information:Blood / Blood Updated:07/02/12 0751     WBC 1.62 (L)      RBC 3.07 (L)      Hgb 10.0 (L) g/dL      Hematocrit 08.6 (L) %      MCV 94.8 fL      MCH 32.6 (H) pg      MCHC 34.4 g/dL      RDW 13 %      Platelets 135 (  L)      MPV 10.4 fL     Manual Differential [[id number]]  (Abnormal) Collected:07/02/12 0651     Segmented Neutrophils 8 % Updated:07/02/12 0751     Band Neutrophils 1 %      Lymphocytes Manual 80 %      Monocytes Manual 10 %      Eosinophils Manual 1 %      Basophils Manual 0 %      Nucleated RBC 0      Abs Seg Manual 0.13 (L)      Bands Absolute 0.02      Absolute Lymph Manual 1.30      Monocytes Absolute 0.16      Absolute Eos Manual 0.02      Absolute Baso Manual 0.00     Cell MorpHology [[id number]] Collected:07/02/12 0651     Cell Morphology: Normal Updated:07/02/12 0751    Proteinase-3 Antibody [[id number]] Collected:06/30/12 1146     Proteinase 3 AB <1.0 Updated:07/01/12 2029            Rads:     Xr Chest 2 Views    06/26/2012   CLINICAL INDICATION: pneumonia  COMPARISON: None available  FINDINGS:   2 views of the chest were obtained. The lungs are clear. The heart and vascularity are within normal limits.  There is no pleural thickening or effusion. The osseous structures are unremarkable.      06/26/2012   No active cardiopulmonary disease   Heron Nay, MD  06/26/2012 11:12 PM     Chest 2 Views    06/25/2012  CLINICAL INDICATION: Fever  COMPARISON: None.  INTERPRETATION: Frontal and lateral views of the chest  obtained. Cardiomediastinal contour within normal limits for age. Clear lungs and sharp sulci.           06/25/2012   No acute cardiopulmonary process.  Filbert Schilder, MD  06/25/2012 10:43 PM         Signed by: Eric Form, MD  Pager: [phone]

## 2012-07-02 NOTE — Progress Notes (Signed)
Infectious Diseases & Tropical Medicine  Progress Note    07/02/2012   [name] GNF:[id number],[id number] is a 57 y.o. female,       Assessment:     Febrile neutropenia improving  Pancytopenia improving  Rash likely contact dermatitis  Blood cultures -NGTD  Acute leukemia  Ehrlichiosis,RMSF and Lyme disease studies pending  Clinically stable  Depression   Diarrhea    Plan:      Leukemia workup in progress   Awaiting Mediport for chemotherapy   Discussed with Dr. Ambrose [place]   Continue doxycycline until pending studies   Stool studies for C. Difficile and ova and parasite pending   Add probiotics   Reviewed notes    ROS:     General:   Fever improved, no chills, no rigor,awake,alert, depressed  HEENT: no neck pain, no throat pain,sinus congestion improved  Endocrine:  Complains of fatigue   Respiratory: occasional cough,no shortness of breath, or wheezing   Cardiovascular: no chest pain   Gastrointestinal: no abdominal pain,no N/V, complains of diarrhea  Genito-Urinary: no dysuria, trouble voiding, or hematuria   Musculoskeletal: no edema  Neurological: c/o generalized weakness   Dermatological: maculopapular rash on the back stable, no ulcer    Physical Examination:     Blood pressure 113/56, pulse 63, temperature 98.4 F (36.9 C), temperature source Oral, resp. rate 17, height 1.651 m (5\' 5" ), weight 50.349 kg (111 lb), SpO2 97.00%.     General Appearance: Comfortable, and in no acute distress. Awake,alert.   HEENT: Pupils are equal, round, and reactive to light.    Lungs: CTA   Heart: RRR   Chest: Symmetric chest wall expansion.    Abdomen: soft ,non tender,no hepatosplenomegaly   Neurological: No focal deficit   Extremities: No edema    Laboratory And Diagnostic Studies:     Recent Labs   [facility] 07/02/12 0651 07/01/12 0425    WBC 1.62* 1.28*    HGB 10.0* 10.2*    HCT 29.1* 30.7*    PLT 135* 125*     No results found for this basename: NA:2,K:2,CL:2,CO2:2,BUN:2,CREAT:2,GLU:2,CA:2 in the  last 72 hours  No results found for this basename: AST:2,ALT:2,ALKPHOS:2,PROT:2,ALB:2 in the last 72 hours    Current Meds:      Scheduled Meds: PRN Meds:           doxycycline 100 mg Oral Q12H SCH   enoxaparin 40 mg Subcutaneous Q24H       Continuous Infusions:         acetaminophen 650 mg Q4H PRN   ALPRAZolam 0.25 mg QHS PRN   morphine 2 mg Q2H PRN   naloxone 0.2 mg PRN         Aamari Strawderman A. Janalyn Rouse, M.D.  07/02/2012  9:06 AM

## 2012-07-02 NOTE — Progress Notes (Signed)
PT IS TRANSFERRED FROM UNIT 23. ALERT AND ORIENTED X 3. DENIES PAIN. ORIENTED PT TO THE UNIT. MAINTAINED NEUTROPENIC PRECAUTION. WILL CONTINUE TO MONITOR FOR LAB RESULTS  NEUTROPENIC PRECAUTION.

## 2012-07-02 NOTE — Plan of Care (Signed)
Problem: Safety  Goal: Patient will be free from injury during hospitalization  Outcome: Progressing  Intervention: Hourly rounding.  Pt is alert and oriented x 4, self care. Maintain Neutropenic precaution. Pt transfers to the unit 21. Report RN Larita Fife at the bedside @1730 . All belonging brings with pt. Pt denies any pain at time. VSS. Hourly rounding. Plan: Starting clear liquid diet [place] and Central line at the [facility] tomorrow AM. Will continue to monitor VS, and plan of care.

## 2012-07-02 NOTE — Plan of Care (Signed)
Problem: Infection/Potential for Infection  Goal: Free from infection  Outcome: Progressing  Pt medicated PRN for temp of 99. PO antibiotics given per order. No other complaints voiced, pt resting comfortably at this time. Neutropenic precautions maintained.  Labs in AM.  Plan: Monitor temp, maintain neutropenic precautions.

## 2012-07-02 NOTE — Progress Notes (Signed)
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  Just spoke to Dr. Verdie [name].  Cytogenetics and FISH panel ordered.  Patient aware.  She will need ECHO and [employer].  Chemotherapy next week.  2- Neutropenic fever.  Still spiking low grade fever.  Continue antibiotics.   3- Diarrhea.  Will send stools.  4- Failure to thrive, weight loss.  5- Skin rash.  6- Chondromalacia Patellae.  7- Start Allopurinol, and prophylactic antibiotics.   8- Discussed epidemiology, treatment and prognosis with patient.     HISTORY:    [name] is a 57 year old who was in her usual state of excellent health until approximately 5 days ago when she began to experience arthralgias, myalgias, and some rhinorrhea. She was noted to have temperature   elevations to as high as 102 and 103 degrees.  It was recommended that she report to the emergency room where she was discovered to have a temperature of 102 degrees.  A CBC at the time of admission revealed a WBC of 0.84,   hemoglobin 11.4, hematocrit 34.2 and a platelet count of 107.  Her absolute neutrophil count was 0.39.    INTERVAL HISTORY:    She continues to spike temperatures at night.  She feels tired.  Has had diarrhea. NO SOB, back or chest pain.  I have discussed the patient case with Dr. Verdie [name].  Marrow has 40%-50% blast, all CD34+.  Awaiting MPO stain, cytogenetics, and FISH.     PHYSICAL EXAM:     Filed Vitals:    07/02/12 0800   BP: 113/56   Pulse: 63   Temp: 98.4 F (36.9 C)   Resp: 17   SpO2: 97%     Appearance: Ill, but in no acute distress  HEENT: Pharynx clear  Lungs: Clear to ausculation  Heart: Regular rate and rhythm, normal heart sounds  Abdomen: Flat and soft, active bowel sounds  Extremities: No edema  Neuro: Alert, appropriate    MEDS:   Scheduled Meds:  Current Facility-Administered Medications   Medication Dose Route Frequency   .  doxycycline  100 mg Oral Q12H SCH   . enoxaparin  40 mg Subcutaneous Q24H     Continuous Infusions:   PRN Meds:.acetaminophen, ALPRAZolam, morphine, naloxone    LABS:     Results     Procedure Component Value Units Date/Time    CBC and differential [[id number]]  (Abnormal) Collected:07/02/12 0651    Specimen Information:Blood / Blood Updated:07/02/12 0751     WBC 1.62 (L)      RBC 3.07 (L)      Hgb 10.0 (L) g/dL      Hematocrit 96.2 (L) %      MCV 94.8 fL      MCH 32.6 (H) pg      MCHC 34.4 g/dL      RDW 13 %      Platelets 135 (L)      MPV 10.4 fL     Manual Differential [[id number]]  (Abnormal) Collected:07/02/12 0651     Segmented Neutrophils 8 % Updated:07/02/12 0751     Band Neutrophils 1 %      Lymphocytes Manual 80 %      Monocytes Manual 10 %      Eosinophils Manual 1 %      Basophils Manual 0 %  Nucleated RBC 0      Abs Seg Manual 0.13 (L)      Bands Absolute 0.02      Absolute Lymph Manual 1.30      Monocytes Absolute 0.16      Absolute Eos Manual 0.02      Absolute Baso Manual 0.00     Cell MorpHology [[id number]] Collected:07/02/12 0651     Cell Morphology: Normal Updated:07/02/12 0751    Proteinase-3 Antibody [[id number]] Collected:06/30/12 1146     Proteinase 3 AB <1.0 Updated:07/01/12 2029    EBV early antigen antibody, IgG [[id number]] Collected:06/30/12 1146    Specimen Information:Blood Updated:07/01/12 1828     EBV EA-D Ab (IgG) <=0.90     Parvovirus B19 IgG & IgM [[id number]]  (Abnormal) Collected:06/27/12 1618     Parvovirus AB B19, IgG 1.7 (H) Updated:07/01/12 1557     Parvovirus Ab B19, IgM 0.1     ANA PANEL [[id number]] Collected:06/30/12 1146     ANA Qualitative Negative Updated:07/01/12 1056     SSA 4      SSA Interp Negative      SSB 4      SSB Interp Negative      Sm 7      Sm Interp Negative      ANA RNP 8      RNP Interp Negative      Scleroderma SCL-70 10      Scl-70 Interp Negative      Jo-1 17      Jo-1 Interp Negative      Anti-DNA (DS) Ab Qn 2      dsDNA Interp Negative       Centromere 1      Centromere Interp Negative      Histone 1      Histone Interp Negative           IMAGING DATA:  Xr Chest 2 Views    06/26/2012   CLINICAL INDICATION: pneumonia  COMPARISON: None available  FINDINGS:   2 views of the chest were obtained. The lungs are clear. The heart and vascularity are within normal limits.  There is no pleural thickening or effusion. The osseous structures are unremarkable.      06/26/2012   No active cardiopulmonary disease   Heron Nay, MD  06/26/2012 11:12 PM     Chest 2 Views    06/25/2012  CLINICAL INDICATION: Fever  COMPARISON: None.  INTERPRETATION: Frontal and lateral views of the chest obtained. Cardiomediastinal contour within normal limits for age. Clear lungs and sharp sulci.           06/25/2012   No acute cardiopulmonary process.  Filbert Schilder, MD  06/25/2012 10:43 PM      Sharmon Revere, MD  [facility]

## 2012-07-03 ENCOUNTER — Encounter
Admission: EM | Disposition: A | Discharge status: Home / Self Care | Payer: Self-pay | Source: Home / Self Care | Attending: Internal Medicine

## 2012-07-03 LAB — MAN DIFF ONLY
Band Neutrophils Absolute: 0.03 (ref 0.00–1.00)
Band Neutrophils: 2 %
Basophils Absolute Manual: 0 (ref 0.00–0.20)
Basophils Manual: 0 %
Eosinophils Absolute Manual: 0.02 (ref 0.00–0.70)
Eosinophils Manual: 1 %
Lymphocytes Absolute Manual: 1.23 (ref 0.50–4.40)
Lymphocytes Manual: 73 %
Monocytes Absolute: 0.2 (ref 0.00–1.20)
Monocytes Manual: 12 %
Neutrophils Absolute Manual: 0.2 — ABNORMAL LOW (ref 1.80–8.10)
Nucleated RBC: 0 (ref 0–1)
Segmented Neutrophils: 12 %

## 2012-07-03 LAB — CBC AND DIFFERENTIAL
Hematocrit: 29.9 % — ABNORMAL LOW (ref 37.0–47.0)
Hgb: 10 g/dL — ABNORMAL LOW (ref 12.0–16.0)
MCH: 32.3 pg — ABNORMAL HIGH (ref 28.0–32.0)
MCHC: 33.4 g/dL (ref 32.0–36.0)
MCV: 96.5 fL (ref 80.0–100.0)
MPV: 9.9 fL (ref 9.4–12.3)
Platelets: 141 (ref 140–400)
RBC: 3.1 — ABNORMAL LOW (ref 4.20–5.40)
RDW: 13 % (ref 12–15)
WBC: 1.69 — ABNORMAL LOW (ref 3.50–10.80)

## 2012-07-03 LAB — CELL MORPHOLOGY: Cell Morphology: ABNORMAL — AB

## 2012-07-03 SURGERY — TUNNELED CATH PLACEMENT
Site: Chest | Laterality: Right

## 2012-07-03 MED ORDER — MIDAZOLAM HCL 2 MG/2ML IJ SOLN
INTRAMUSCULAR | Status: AC
Start: 2012-07-03 — End: 2012-07-03
  Filled 2012-07-03: qty 2

## 2012-07-03 MED ORDER — MIDAZOLAM HCL 5 MG/5ML IJ SOLN
INTRAMUSCULAR | Status: AC | PRN
Start: 2012-07-03 — End: 2012-07-03
  Administered 2012-07-03: 1 mg via INTRAVENOUS

## 2012-07-03 MED ORDER — FENTANYL CITRATE 0.05 MG/ML IJ SOLN
INTRAMUSCULAR | Status: AC | PRN
Start: 2012-07-03 — End: 2012-07-03
  Administered 2012-07-03: 50 ug via INTRAVENOUS

## 2012-07-03 MED ORDER — LIDOCAINE HCL (PF) 1 % IJ SOLN
INTRAMUSCULAR | Status: AC
Start: 2012-07-03 — End: 2012-07-03
  Administered 2012-07-03: 10 mL
  Filled 2012-07-03: qty 30

## 2012-07-03 MED ORDER — CEFAZOLIN SODIUM 1 G IJ SOLR
INTRAMUSCULAR | Status: AC
Start: 2012-07-03 — End: 2012-07-03
  Filled 2012-07-03: qty 1000

## 2012-07-03 MED ORDER — LIDOCAINE-EPINEPHRINE 2 %-1:200000 IJ SOLN
INTRAMUSCULAR | Status: AC
Start: 2012-07-03 — End: 2012-07-03
  Administered 2012-07-03: 10 mL
  Filled 2012-07-03: qty 20

## 2012-07-03 MED ORDER — FENTANYL CITRATE 0.05 MG/ML IJ SOLN
INTRAMUSCULAR | Status: AC
Start: 2012-07-03 — End: 2012-07-03
  Filled 2012-07-03: qty 2

## 2012-07-03 MED ORDER — SODIUM CHLORIDE 0.9 % IV MBP
1.0000 g | Freq: Once | INTRAVENOUS | Status: AC
Start: 2012-07-03 — End: 2012-07-03
  Administered 2012-07-03: 1 g via INTRAVENOUS

## 2012-07-03 MED ORDER — ALLOPURINOL 100 MG PO TABS
100.0000 mg | ORAL_TABLET | Freq: Three times a day (TID) | ORAL | Status: AC
Start: 2012-07-03 — End: 2012-07-08
  Administered 2012-07-03 – 2012-07-08 (×15): 100 mg via ORAL
  Filled 2012-07-03 (×15): qty 1

## 2012-07-03 MED ORDER — CEFAZOLIN SODIUM 1 G IJ SOLR
1.0000 g | Freq: Once | INTRAMUSCULAR | Status: DC
Start: 2012-07-03 — End: 2012-07-03

## 2012-07-03 NOTE — Progress Notes (Signed)
Infectious Diseases & Tropical Medicine  Progress Note    07/03/2012   [name] WUX:[id number]:[id number] is a 57 y.o. female,       Assessment:     Febrile neutropenia improving  Pancytopenia improving  Rash likely contact dermatitis  Blood cultures -NGTD  Acute leukemia  S/P  Groshong catheter placement  MUGA scan-ejection fraction 79%   Ehrlichiosis,RMSFstudies pending  Lyme antibody test negative  Parvovirus IgM antibody is negative (IgG antibodies-2.0)  Stool studies pending  Clinically stable  Depression   Diarrhea    Plan:      Leukemia workup in progress   Awaiting chemotherapy   Continue doxycycline   Continue probiotics   Reviewed notes    ROS:     General:   Fever improved, no chills, no rigor,awake,alert  HEENT: no neck pain, no throat pain,sinus congestion improved  Endocrine: c/o fatigue   Respiratory: occasional cough,no shortness of breath, or wheezing   Cardiovascular: no chest pain   Gastrointestinal: no abdominal pain,no N/V, c/o diarrhea  Genito-Urinary: no dysuria, trouble voiding, or hematuria   Musculoskeletal: no edema  Neurological: c/o generalized weakness   Dermatological: maculopapular rash on the back stable, no ulcer    Physical Examination:     Blood pressure 117/64, pulse 66, temperature 98.6 F (37 C), temperature source Oral, resp. rate 18, height 1.651 m (5\' 5" ), weight 50.349 kg (111 lb), SpO2 97.00%.     General Appearance: Comfortable, and in no acute distress. Awake,alert, no new issues   HEENT: Pupils are equal, round, and reactive to light.    Lungs:  Scattered rhonchi   Heart: RRR   Chest: Symmetric chest wall expansion.    Abdomen: soft ,non tender,no hepatosplenomegaly   Neurological: No focal deficit   Extremities: No edema    Laboratory And Diagnostic Studies:     Recent Labs   [facility] 07/03/12 0434 07/02/12 0651    WBC 1.69* 1.62*    HGB 10.0* 10.0*    HCT 29.9* 29.1*    PLT 141 135*     No results found for this basename:  NA:2,K:2,CL:2,CO2:2,BUN:2,CREAT:2,GLU:2,CA:2 in the last 72 hours  No results found for this basename: AST:2,ALT:2,ALKPHOS:2,PROT:2,ALB:2 in the last 72 hours    Current Meds:      Scheduled Meds: PRN Meds:           doxycycline 100 mg Oral Q12H SCH   enoxaparin 40 mg Subcutaneous Q24H   [COMPLETED] heparin (porcine)      lactobacillus/streoptococcus 1 capsule Oral Daily       Continuous Infusions:         acetaminophen 650 mg Q4H PRN   ALPRAZolam 0.25 mg QHS PRN   morphine 2 mg Q2H PRN   naloxone 0.2 mg PRN   [COMPLETED] Tc68m labeled RBC's 23 milli Curie ONCE PRN         Kensly Bowmer A. Janalyn Rouse, M.D.  07/03/2012  8:19 AM

## 2012-07-03 NOTE — Brief Op Note (Signed)
[facility]  [facility]   Brief Op Note       Physician(s): Dara Lords, MD    Assistant(s):  None    Pre-operative Diagnosis: leukemia    Post-operative Diagnosis: Diagnosis is same as preoperative diagnosis    Procedure(s) Performed: tunneled central line (groshong equivalent)                                                                                                 Anesthesia:  Moderate Sedation and Local with 1% Lidocaine    Complications: None    Estimated Blood Loss:  None    Blood Aministered:  None    Fluid Aministered:  Per Nursing    Tubes and Drains: None    Implant(s): Power line- dual lumen- groshong equivalent    Specimens: None    Findings:  Rt IJ tunneled central line placement  OK to use.     Patient was transferred from the procedure room to the [facility] in stable condition.  Procedure note to be dictated.    Signed by: Dara Lords, MD  [facility] Department  [facility] [phone]  [facility] [phone]

## 2012-07-03 NOTE — Plan of Care (Addendum)
Problem: Safety  Goal: Patient will be free from injury during hospitalization  Outcome: Progressing  Patient to wear non-skid soaks when walking in the room. Clear liquid post midnight for cental line placement.    Problem: Pain  Goal: Patient's pain/discomfort is manageable  Outcome: Progressing  Patient denies any pain with assessment.     Problem: Protective Precautions [in description add neutropenic precautions]  Goal: Free from nosocomial infections  Outcome: Progressing  Patient maintained on neutropenic isolation.  Taken Tylenol for temperature of 99.11F and have sweating episodes during the shift. Assisted to keep gown and linen dry and clean.    Neutropenic education information given to patient.

## 2012-07-03 NOTE — Plan of Care (Signed)
Problem: Protective Precautions [in description add neutropenic precautions]  Goal: Free from nosocomial infections  Outcome: Progressing  Followed neutropenic precautions.      Comments:   Live with cancer order placed to speak with patient.  Groshong placed this afternoon.

## 2012-07-03 NOTE — H&P (Signed)
[facility]  [facility]   History and Physical     History and Physical:   Asked by Dr. Ambrose Finland to see patient for placement of a tunneled central line for chemotherapy administration for chemotherapy. [name] is a 57 y/o with recently diagnosis of acute leukemia.  The patient was interviewed and examined in the preoperative holding area. The previously documented history and physical were reviewed in detail and changes are none.    ASA Classification:   []    ASA 1  Healthy patient  []    ASA 2  Mild systemic illness  [x]    ASA 3  Systemic disease, though not incapacitating  []    ASA 4  Severe systemic disease that is a constant threat to life   []    ASA 5  Moribund condition, patient unexpected to live >24 hours, irrespective of procedure  []    E         Emergent procedure    Mallampati Score:   []  1  [x]  2  []  3  []  4  []  Intubated  []  Tracheostomy    Planned Anesthesia:   []  No sedation  [x]  Local  [x]  Moderate sedation  []  Deep sedation (with Anesthesiology present)  []  General Anesthesia     Airway Assesment:   [x]  Normal  [] Compromised    Diagnosis: Neutropenia, fever   Procedure: Tunneled central venous access placement    The patient is medically stable and appropriate to undergo the planned procedure. All risks and alternatives were discussed with the patient and she agrees to proceed.     Signed by: Lucrezia Europe, NP  [facility] Department  [phone]

## 2012-07-03 NOTE — Progress Notes (Signed)
Nutritional Support Services  Nutrition Assessment    [name] 57 y.o. female   MRN: [id number]                                                          Assessment Data:   Referral Source: Length of Stay- day 8  Reason for Referral: Nutrition Assessment       Adm dx:  <principal problem not specified>     Patient Active Problem List   Diagnosis   . Chondromalacia of patella   . Neutropenic fever       PMH:  has a past medical history of Asthma without status asthmaticus.    PSH:  has past surgical history that includes Hand surgery.     Social History: no smoking, drinks alcohol socially     Procedures: (6/16): Bone marrow aspirate and bone biopsy: Leukemia found   (6/20): Tunneled central line    Other: Pt diagnosed with acute leukemia- awaiting mediport for chemotherapy     Pertinent labs:  Lab 07/03/12 0434 07/02/12 0651 07/01/12 0425 06/29/12 5409 06/28/12 0659   NA -- -- -- -- --   K -- -- -- -- --   CL -- -- -- -- --   CO2 -- -- -- -- --   BUN -- -- -- -- --   CREAT -- -- -- -- --   GLU -- -- -- -- --   CA -- -- -- -- --   MG -- -- -- -- --   PHOS -- -- -- -- --   EGFR -- -- -- -- --   WBC 1.69* 1.62* 1.28* 0.83* 0.74*   HCT 29.9* 29.1* 30.7* 28.6* 25.3*   HGB 10.0* 10.0* 10.2* 9.8* 8.8*       Other Labs: Previous labs obtained (6/12)      Pertinent Meds: Risaquad       GI symptoms: C. Diff negative, diarrhea noted    Hydration:      Skin: None noted    Diet:    Pt was following a gluten/dairy free diet that started in 2013.  Pt states she was having knee problems and a doctor told her to try it.  Pt states she loved dairy and drank a lot of milk, however, when she changed her diet she ended up losing weight.     Pt states that she recently decided to eat these foods again.  Pt reports eating healthy normally, but does not like sweets much.    Pt reports being physically active, going to the gym 3x per week.    Her UBW prior to losing weight was 122 lb  Pt reports her current UBW: 115 lb       Current  Diet Order  Diet clear liquid      Prior- pt on neutropenic diet  Pt reports being hungry right now, wanting food.    Allergies   Allergen Reactions   . Codeine Nausea Only         Anthropometrics:   Anthropometrics  Height: 165.1 cm (5\' 5" )  Weight: 50.349 kg (111 lb)  Weight Change: 0   IBW/kg (Calculated) Female: 61.83 kg  IBW/kg (Calculated) Female: 56.81 kg  BMI (calculated): 18.5     Bedside scale: 103 lb, RD obtained weight  but bed was not zero'd out.    UBW: 115 lb                 Estimated Needs:  Estimated Energy Needs  Total Energy Estimated Needs: 6010-9323 kcal/day  Method for Estimating Needs: 30-35 kcal/kg    Estimated Protein Needs  Total Protein Estimated Needs: 50-65 gm pro/day  Method for Estimating Needs: 1-1.3 gm pro/kg    Fluid Needs  Total Fluid Estimated Needs: ~1500 ml/day or per MD  Method for Estimating Needs: ~30 ml/kg        Learning Needs:   Yes- Nutrition with cancer                                                              Nutrition Diagnosis      Increased nutrient needs related to medical condition- Leukemia as evidenced by increased estimated needs    Moderate malnutrition related to acute/chronic illness as evidenced by 90% UBW of 115 lb, 12 lb weight loss noted from bedscale compared to usual body weight (10%) considered a significant amount, visible low muscle tone                                                              Intervention/Monitoring     Goal: Obtain adequate nutrition, halt weight loss    1) When feasible, initiate neutropenic diet    2) Add Ensure Plus (Chocoalte) BID    3) Diet education given for Nutrition with cancer.  Cancer booklet given.  RD discussed the importance of protein intake and maintaining weight during this time.  RD also discussed the need to be liberal on po intake and allow any foods that sound good in order to prevent weight loss    4) Obtain new weight, RD discussed with RN- Tammy.  Pt to be transferred to a different bed.    Monitor po  intake, labs, GI fxn, side effects of chemo closely                                                              Evaluation     RD to monitor pt at high-mod nutrition risk    Quinnetta Roepke L. Lenon Ahmadi, RD, LD  Spectralink x [phone]  Nutrition Office x [phone]

## 2012-07-03 NOTE — Progress Notes (Signed)
Daily PROGRESS NOTE    Date Time: 07/03/2012 9:50 PM  Patient Name: [name], [name]  Patient Status: Inpatient  Hospital Day: 8    Assessment:   Acute Leukemia, NOS   Neutropenic fever  . Pancytopenia.   . Weight loss with a body mass index of 18 shows mild to moderate   Malnutrition  . Skin rash.  Diarrhea      Plan:   1. D/c Doxy if ok with ID  2. Bone Marrow with Blast cells  3. Monitor rash   4. Stool c.diff toxin is negative  5. Chemo, Access placed today    Subjective:   Aches, fatigue, Insomnia, Skin rash on the back, Diarrhea  10 point Review of Systems - Negative except for the Positives mentioned above      Medications:     Current Facility-Administered Medications   Medication Dose Route Frequency   . allopurinol  100 mg Oral TID   . [COMPLETED] ceFAZolin  1 g Intravenous Once   . [COMPLETED] ceFAZolin       . doxycycline  100 mg Oral Q12H SCH   . enoxaparin  40 mg Subcutaneous Q24H   . [COMPLETED] fentaNYL       . [COMPLETED] fentaNYL       . lactobacillus/streoptococcus  1 capsule Oral Daily   . [COMPLETED] lidocaine       . [COMPLETED] lidocaine-EPINEPHrine       . [COMPLETED] midazolam       . [COMPLETED] midazolam       . [DISCONTINUED] ceFAZolin  1 g Intravenous Once          acetaminophen 650 mg Q4H PRN   ALPRAZolam 0.25 mg QHS PRN   [COMPLETED] fentaNYL  Code/Trauma Med   [COMPLETED] fentaNYL  Code/Trauma Med   [COMPLETED] fentaNYL  Code/Trauma Med   [COMPLETED] midazolam  Code/Trauma Med   [COMPLETED] midazolam  Code/Trauma Med   [COMPLETED] midazolam  Code/Trauma Med   morphine 2 mg Q2H PRN   naloxone 0.2 mg PRN        Physical Exam:     Filed Vitals:    07/03/12 1956   BP: 122/56   Pulse: 66   Temp: 98.3 F (36.8 C)   Resp: 16   SpO2: 98%       Intake and Output Summary (Last 24 hours) at Date Time  No intake or output data in the 24 hours ending 07/03/12 2150    Physical Exam   Constitutional: She is oriented to person, place, and time. She appears well-developed.   HENT:   Head:  Normocephalic and atraumatic.   Eyes: Pupils are equal, round, and reactive to light.   Neck: No JVD present. No tracheal deviation present. No thyromegaly present.   Cardiovascular: Normal rate and normal heart sounds.    Pulmonary/Chest: No respiratory distress. She has no wheezes. She has no rales.   Abdominal: She exhibits no distension. There is no tenderness. There is no rebound and no guarding.   Musculoskeletal: She exhibits no edema.   Neurological: She is alert and oriented to person, place, and time.   Erythematous Maculopapular rash on upper back          Labs:     Results     Procedure Component Value Units Date/Time    CBC and differential [[id number]]  (Abnormal) Collected:07/03/12 0434    Specimen Information:Blood / Blood Updated:07/03/12 1400     WBC 1.69 (L)  RBC 3.10 (L)      Hgb 10.0 (L) g/dL      Hematocrit 40.9 (L) %      MCV 96.5 fL      MCH 32.3 (H) pg      MCHC 33.4 g/dL      RDW 13 %      Platelets 141      MPV 9.9 fL     CBC and differential [[id number]]  (Abnormal) Collected:07/01/12 0425    Specimen Information:Blood / Blood Updated:07/03/12 1348     WBC 1.28 (L)      RBC 3.18 (L)      Hgb 10.2 (L) g/dL      Hematocrit 95.6 (L) %      MCV 96.5 fL      MCH 32.1 (H) pg      MCHC 33.2 g/dL      RDW 13 %      Platelets 125 (L)      MPV 10.5 fL     CBC and differential [[id number]]  (Abnormal) Collected:07/02/12 0651    Specimen Information:Blood / Blood Updated:07/03/12 1348     WBC 1.62 (L)      RBC 3.07 (L)      Hgb 10.0 (L) g/dL      Hematocrit 46.9 (L) %      MCV 94.8 fL      MCH 32.6 (H) pg      MCHC 34.4 g/dL      RDW 13 %      Platelets 135 (L)      MPV 10.4 fL     CBC with differential [[id number]]  (Abnormal) Collected:06/28/12 0659    Specimen Information:Blood / Blood Updated:07/03/12 1348     WBC 0.74 (L)      RBC 2.72 (L)      Hgb 8.8 (L) g/dL      Hematocrit 24.4 (L) %      MCV 93.0 fL      MCH 32.4 (H) pg      MCHC 34.8 g/dL      RDW 14 %      Platelets 74 (L)      MPV 9.9 fL      CBC with differential [[id number]]  (Abnormal) Collected:06/29/12 0608    Specimen Information:Blood / Blood Updated:07/03/12 1348     WBC 0.83 (L)      RBC 2.98 (L)      Hgb 9.8 (L) g/dL      Hematocrit 64.4 (L) %      MCV 96.0 fL      MCH 32.9 (H) pg      MCHC 34.3 g/dL      RDW 13 %      Platelets 85 (L)      MPV 10.3 fL     CBC and differential [[id number]]  (Abnormal) Collected:06/25/12 2221    Specimen Information:Blood / Blood Updated:07/03/12 1348     WBC 0.84 (L)      RBC 3.55 (L)      Hgb 11.4 (L) g/dL      Hematocrit 63.8 (L) %      MCV 96.3 fL      MCH 32.1 (H) pg      MCHC 33.3 g/dL      RDW 14 %      Platelets 107 (L)      MPV 9.9 fL     CBC with differential [[id number]]  (Abnormal) Collected:06/27/12 0510  Specimen Information:Blood / Blood Updated:07/03/12 1348     WBC 1.20 (L)      RBC 3.01 (L)      Hgb 9.9 (L) g/dL      Hematocrit 54.0 (L) %      MCV 94.7 fL      MCH 32.9 (H) pg      MCHC 34.7 g/dL      RDW 14 %      Platelets 89 (L)      MPV 10.6 fL     Lyme Ab Tot Rflx to [facility] IGG/IGM [[id number]] Collected:06/30/12 1805    Specimen Information:Blood Updated:07/03/12 0909     Progressive Lyme Not indicated      Lyme Disease AB Screen <=0.90     Manual Differential [[id number]]  (Abnormal) Collected:07/03/12 0434     Segmented Neutrophils 12 % Updated:07/03/12 0712     Band Neutrophils 2 %      Lymphocytes Manual 73 %      Monocytes Manual 12 %      Eosinophils Manual 1 %      Basophils Manual 0 %      Nucleated RBC 0      Abs Seg Manual 0.20 (L)      Bands Absolute 0.03      Absolute Lymph Manual 1.23      Monocytes Absolute 0.20      Absolute Eos Manual 0.02      Absolute Baso Manual 0.00     Cell MorpHology [[id number]]  (Abnormal) Collected:07/03/12 0434     Cell Morphology: Abnormal (A) Updated:07/03/12 0712     Anisocytosis =1+ (A)             Rads:     Xr Chest 2 Views    06/26/2012   CLINICAL INDICATION: pneumonia  COMPARISON: None available  FINDINGS:   2 views of the chest were obtained.  The lungs are clear. The heart and vascularity are within normal limits.  There is no pleural thickening or effusion. The osseous structures are unremarkable.      06/26/2012   No active cardiopulmonary disease   Heron Nay, MD  06/26/2012 11:12 PM     Chest 2 Views    06/25/2012  CLINICAL INDICATION: Fever  COMPARISON: None.  INTERPRETATION: Frontal and lateral views of the chest obtained. Cardiomediastinal contour within normal limits for age. Clear lungs and sharp sulci.           06/25/2012   No acute cardiopulmonary process.  Filbert Schilder, MD  06/25/2012 10:43 PM         Signed by: Eric Form, MD  Pager: [phone]

## 2012-07-03 NOTE — H&P (Signed)
[facility]  [facility]          I have evaluated the patient and agree with the above documented evaluation and plan.    Signed by: Arieh Bogue M Brendaliz Kuk, MD  [facility] Department  [phone]

## 2012-07-03 NOTE — Progress Notes (Signed)
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 Spoke to Dr. Verdie Mosher - awaiting final report.  Cytogenetics, FISH panel and prognostic molecular studies ordered.  Patient received Groshong catheter.  Will start Allopurinol.  Will  Start 7=3 (Arac C + Idarubicin) on Monday.   2- Neutropenic fever.  Still spiking low grade fever.  Continue antibiotics.   3- Diarrhea.  Will send stools.  4- Failure to thrive, weight loss.  5- Skin rash.  6- Chondromalacia Patellae.    HISTORY:    [name] is a 57 year old who was in her usual state of excellent health until approximately 5 days ago when she began to experience arthralgias, myalgias, and some rhinorrhea. She was noted to have temperature   elevations to as high as 102 and 103 degrees.  It was recommended that she report to the emergency room where she was discovered to have a temperature of 102 degrees.  A CBC at the time of admission revealed a WBC of 0.84, hemoglobin 11.4, hematocrit 34.2 and a platelet count of 107.  Her absolute neutrophil count was 0.39.    INTERVAL HISTORY:    She continues to spike temperatures at night.  She feels tired.  Diarrhea. NO SOB, back or chest pain.  Spoke twice today with Dr. Verdie Mosher.  Blast have lymphoid and myeloid morphology and phenotype. Interrestingly, no blasts seen in the periphery.    PHYSICAL EXAM:     Filed Vitals:    07/03/12 1259   BP: 113/56   Pulse: 77   Temp:    Resp: 0   SpO2: 99%     Appearance: Ill, but in no acute distress  HEENT: Pharynx clear  Lungs: Clear to ausculation  Heart: Regular rate and rhythm, normal heart sounds  Abdomen: Flat and soft, active bowel sounds  Extremities: No edema  Neuro: Alert, appropriate    MEDS:   Scheduled Meds:  Current Facility-Administered Medications   Medication Dose Route Frequency   . [COMPLETED] ceFAZolin  1 g Intravenous Once   . [COMPLETED]  ceFAZolin       . doxycycline  100 mg Oral Q12H SCH   . enoxaparin  40 mg Subcutaneous Q24H   . [COMPLETED] fentaNYL       . [COMPLETED] fentaNYL       . lactobacillus/streoptococcus  1 capsule Oral Daily   . [COMPLETED] lidocaine       . [COMPLETED] lidocaine-EPINEPHrine       . [COMPLETED] midazolam       . [COMPLETED] midazolam       . [DISCONTINUED] ceFAZolin  1 g Intravenous Once     Continuous Infusions:   PRN Meds:.acetaminophen, ALPRAZolam, [COMPLETED] fentaNYL, [COMPLETED] fentaNYL, [COMPLETED] fentaNYL, [COMPLETED] midazolam, [COMPLETED] midazolam, [COMPLETED] midazolam, morphine, naloxone    LABS:     Results     Procedure Component Value Units Date/Time    CBC and differential [[id number]]  (Abnormal) Collected:07/03/12 0434    Specimen Information:Blood / Blood Updated:07/03/12 1400     WBC 1.69 (L)      RBC 3.10 (L)      Hgb 10.0 (L) g/dL      Hematocrit 40.9 (L) %      MCV 96.5 fL      MCH 32.3 (H) pg      MCHC 33.4 g/dL  RDW 13 %      Platelets 141      MPV 9.9 fL     CBC and differential [[id number]]  (Abnormal) Collected:07/01/12 0425    Specimen Information:Blood / Blood Updated:07/03/12 1348     WBC 1.28 (L)      RBC 3.18 (L)      Hgb 10.2 (L) g/dL      Hematocrit 75.6 (L) %      MCV 96.5 fL      MCH 32.1 (H) pg      MCHC 33.2 g/dL      RDW 13 %      Platelets 125 (L)      MPV 10.5 fL     CBC and differential [[id number]]  (Abnormal) Collected:07/02/12 0651    Specimen Information:Blood / Blood Updated:07/03/12 1348     WBC 1.62 (L)      RBC 3.07 (L)      Hgb 10.0 (L) g/dL      Hematocrit 41.6 (L) %      MCV 94.8 fL      MCH 32.6 (H) pg      MCHC 34.4 g/dL      RDW 13 %      Platelets 135 (L)      MPV 10.4 fL     CBC with differential [[id number]]  (Abnormal) Collected:06/28/12 0659    Specimen Information:Blood / Blood Updated:07/03/12 1348     WBC 0.74 (L)      RBC 2.72 (L)      Hgb 8.8 (L) g/dL      Hematocrit 09.3 (L) %      MCV 93.0 fL      MCH 32.4 (H) pg      MCHC 34.8 g/dL      RDW 14 %       Platelets 74 (L)      MPV 9.9 fL     CBC with differential [[id number]]  (Abnormal) Collected:06/29/12 0608    Specimen Information:Blood / Blood Updated:07/03/12 1348     WBC 0.83 (L)      RBC 2.98 (L)      Hgb 9.8 (L) g/dL      Hematocrit 25.4 (L) %      MCV 96.0 fL      MCH 32.9 (H) pg      MCHC 34.3 g/dL      RDW 13 %      Platelets 85 (L)      MPV 10.3 fL     CBC and differential [[id number]]  (Abnormal) Collected:06/25/12 2221    Specimen Information:Blood / Blood Updated:07/03/12 1348     WBC 0.84 (L)      RBC 3.55 (L)      Hgb 11.4 (L) g/dL      Hematocrit 83.1 (L) %      MCV 96.3 fL      MCH 32.1 (H) pg      MCHC 33.3 g/dL      RDW 14 %      Platelets 107 (L)      MPV 9.9 fL     CBC with differential [[id number]]  (Abnormal) Collected:06/27/12 0510    Specimen Information:Blood / Blood Updated:07/03/12 1348     WBC 1.20 (L)      RBC 3.01 (L)      Hgb 9.9 (L) g/dL      Hematocrit 71.0 (L) %      MCV 94.7 fL  MCH 32.9 (H) pg      MCHC 34.7 g/dL      RDW 14 %      Platelets 89 (L)      MPV 10.6 fL     Lyme Ab Tot Rflx to [facility] IGG/IGM [[id number]] Collected:06/30/12 1805    Specimen Information:Blood Updated:07/03/12 0909     Progressive Lyme Not indicated      Lyme Disease AB Screen <=0.90     Manual Differential [[id number]]  (Abnormal) Collected:07/03/12 0434     Segmented Neutrophils 12 % Updated:07/03/12 0712     Band Neutrophils 2 %      Lymphocytes Manual 73 %      Monocytes Manual 12 %      Eosinophils Manual 1 %      Basophils Manual 0 %      Nucleated RBC 0      Abs Seg Manual 0.20 (L)      Bands Absolute 0.03      Absolute Lymph Manual 1.23      Monocytes Absolute 0.20      Absolute Eos Manual 0.02      Absolute Baso Manual 0.00     Cell MorpHology [[id number]]  (Abnormal) Collected:07/03/12 0434     Cell Morphology: Abnormal (A) Updated:07/03/12 0712     Anisocytosis =1+ (A)           IMAGING DATA:  Xr Chest 2 Views    06/26/2012   CLINICAL INDICATION: pneumonia  COMPARISON: None available  FINDINGS:    2 views of the chest were obtained. The lungs are clear. The heart and vascularity are within normal limits.  There is no pleural thickening or effusion. The osseous structures are unremarkable.      06/26/2012   No active cardiopulmonary disease   Heron Nay, MD  06/26/2012 11:12 PM     Chest 2 Views    06/25/2012  CLINICAL INDICATION: Fever  COMPARISON: None.  INTERPRETATION: Frontal and lateral views of the chest obtained. Cardiomediastinal contour within normal limits for age. Clear lungs and sharp sulci.           06/25/2012   No acute cardiopulmonary process.  Filbert Schilder, MD  06/25/2012 10:43 PM      Sharmon Revere, MD  [facility]

## 2012-07-04 LAB — EHRLICHIA CHAFFEENSIS ANTIBODIES (IGG,IGM)
Ehrlichia chaffeensis IgM: <1:20 {titer}
Ehrlichia chaffeensis, IgG: <1:64 {titer}

## 2012-07-04 NOTE — Progress Notes (Signed)
Infectious Diseases & Tropical Medicine  Progress Note    07/04/2012   Jakirah Zaun [id number] is a 57 y.o. female,       Assessment:     Febrile neutropenia improving  Pancytopenia improving  Rash likely contact dermatitis  Blood cultures -NGTD  Acute leukemia  S/P  Groshong catheter placement  MUGA scan-ejection fraction 79%   Lyme antibody test negative  Parvovirus IgM antibody is negative (IgG antibodies-2.0)  Stool studies pending  Clinically stable  Depression   Diarrhea resolved    Plan:      Awaiting chemotherapy likely on Monday    Discontinue doxycycline   Continue probiotics   Reviewed notes    ROS:     General:   Fever improved, no chills, no rigor,awake,alert  HEENT: no neck pain, no throat pain  Endocrine:no fatigue   Respiratory: no cough,no shortness of breath, or wheezing   Cardiovascular: no chest pain   Gastrointestinal: no abdominal pain,no N/V/D  Genito-Urinary: no dysuria, trouble voiding, or hematuria   Musculoskeletal: no edema  Neurological: c/o generalized weakness   Dermatological: maculopapular rash on the back improving, no ulcer    Physical Examination:     Blood pressure 114/55, pulse 73, temperature 98.5 F (36.9 C), temperature source Oral, resp. rate 16, height 1.651 m (5\' 5" ), weight 45.36 kg (100 lb), SpO2 98.00%.     General Appearance: Comfortable, and in no acute distress. Awake,alert   HEENT: Pupils are equal, round, and reactive to light.    Lungs: CTA   Heart: RRR   Chest: Symmetric chest wall expansion.    Abdomen: soft ,non tender,no hepatosplenomegaly   Neurological: No focal deficit   Extremities: No edema    Laboratory And Diagnostic Studies:     Recent Labs   [facility] 07/03/12 0434 07/02/12 0651    WBC 1.69* 1.62*    HGB 10.0* 10.0*    HCT 29.9* 29.1*    PLT 141 135*     No results found for this basename: NA:2,K:2,CL:2,CO2:2,BUN:2,CREAT:2,GLU:2,CA:2 in the last 72 hours  No results found for this basename:  AST:2,ALT:2,ALKPHOS:2,PROT:2,ALB:2 in the last 72 hours    Current Meds:      Scheduled Meds: PRN Meds:           allopurinol 100 mg Oral TID   doxycycline 100 mg Oral Q12H SCH   enoxaparin 40 mg Subcutaneous Q24H   lactobacillus/streoptococcus 1 capsule Oral Daily       Continuous Infusions:         acetaminophen 650 mg Q4H PRN   ALPRAZolam 0.25 mg QHS PRN   morphine 2 mg Q2H PRN   naloxone 0.2 mg PRN         Wynston Romey A. Janalyn Rouse, M.D.  07/04/2012  1:29 PM

## 2012-07-04 NOTE — Progress Notes (Signed)
Daily PROGRESS NOTE    Date Time: 07/04/2012 2:46 PM  Patient Name: [name], [name]  Patient Status: Inpatient  Hospital Day: 9    Assessment:   Acute Myeloid  Leukemia   Neutropenic fever  Pancytopenia.    Weight loss with a body mass index of 18 shows mild to moderate   Malnutrition   Skin rash.  Diarrhea      Plan:   1. Arac C and idarubicin on Monday   2. Bone Marrow with Blast cells- waiting for FISH and cytogenetics   3. Monitor rash   4. Stool c.diff toxin is negative  5. Patient is SP groshong catheter     Subjective:   Patient denies any acute complaints       Medications:     Current Facility-Administered Medications   Medication Dose Route Frequency   . allopurinol  100 mg Oral TID   . doxycycline  100 mg Oral Q12H SCH   . enoxaparin  40 mg Subcutaneous Q24H   . lactobacillus/streoptococcus  1 capsule Oral Daily          acetaminophen 650 mg Q4H PRN   ALPRAZolam 0.25 mg QHS PRN   morphine 2 mg Q2H PRN   naloxone 0.2 mg PRN        Physical Exam:     Filed Vitals:    07/04/12 0724   BP: 114/55   Pulse: 73   Temp: 98.5 F (36.9 C)   Resp: 16   SpO2: 98%       Intake and Output Summary (Last 24 hours) at Date Time  No intake or output data in the 24 hours ending 07/04/12 1446    Physical Exam   Constitutional: She is oriented to person, place, and time. She appears well-developed.   HENT:   Head: Normocephalic and atraumatic.   Eyes: Pupils are equal, round, and reactive to light.   Neck: No JVD present. No tracheal deviation present. No thyromegaly present.   Cardiovascular: Normal rate and normal heart sounds.    Pulmonary/Chest: No respiratory distress. She has no wheezes. She has no rales.   Abdominal: She exhibits no distension. There is no tenderness. There is no rebound and no guarding.   Musculoskeletal: She exhibits no edema.   Neurological: She is alert and oriented to person, place, and time.   Erythematous Maculopapular rash on upper back          Labs:     Results     Procedure Component  Value Units Date/Time    Ehrlichia chaffeensis Aby IgG IgM [[id number]] Collected:06/30/12 1805     Ehrlichia chaffeensis IgM <1:20 Updated:07/04/12 1430     Ehrlichia chaffeensis, IgG <1:64      Interpretation: SEE BELOW             Rads:            Signed by: Peyton Najjar, MD  Pager: [phone]

## 2012-07-04 NOTE — Plan of Care (Signed)
Problem: Pain  Goal: Patient's pain/discomfort is manageable  Outcome: Progressing  Patient denies any pain or discomfort. Patient initially had pain to groshong site. No complaints of pain to that site this shift.    Problem: Infection/Potential for Infection  Goal: Free from infection  Outcome: Progressing  Patient maintained on neutropenic precautions. Wbc=169 today.

## 2012-07-04 NOTE — Plan of Care (Signed)
Problem: Pain  Goal: Patient's pain/discomfort is manageable  Outcome: Progressing  Patient verbalizes of having discomfort to the Groshong site with movement at 0136, medicated with Tylenol no further complain. Site clean, dry, and intact with spontaneous blood return no resistance when being flush.    Problem: Protective Precautions [in description add neutropenic precautions]  Goal: Free from nosocomial infections  Outcome: Progressing  Maintained on Neutropenic precaution, verbalizes of understanding.     Problem: Anxiety  Goal: Anxiety is a manageable level  Outcome: Progressing  Patient had pastoral visit during the evening.  Requested to have one again for spirituals support, an ordered is placed.

## 2012-07-05 NOTE — Progress Notes (Signed)
Infectious Diseases & Tropical Medicine  Progress Note    07/05/2012   [name] [id number] is a 57 y.o. female,       Assessment:     Pancytopenia  Rash likely contact dermatitis  Blood cultures -NGTD  Acute leukemia  S/P  Groshong catheter placement  MUGA scan-ejection fraction 79%   Lyme antibody test negative  Parvovirus IgM antibody is negative (IgG antibodies-2.0)  Stool studies pending  Clinically stable  Depression   Diarrhea resolved  Fever resolved    Plan:      Likely start of chemotherapy tomorrow   Continue probiotics   Reviewed notes    ROS:     General:   Fever improved, no chills, no rigor,awake,alert, no new issues  HEENT: no neck pain, no throat pain  Endocrine:no fatigue   Respiratory: no cough,no shortness of breath, or wheezing   Cardiovascular: no chest pain   Gastrointestinal: no abdominal pain,no N/V/D  Genito-Urinary: no dysuria, trouble voiding, or hematuria   Musculoskeletal: no edema  Neurological: c/o generalized weakness   Dermatological: maculopapular rash on the back improving, no ulcer    Physical Examination:     Blood pressure 112/58, pulse 70, temperature 98.3 F (36.8 C), temperature source Oral, resp. rate 16, height 1.651 m (5\' 5" ), weight 45.36 kg (100 lb), SpO2 98.00%.     General Appearance: Comfortable, and in no acute distress. Awake,alert   HEENT: Pupils are equal, round, and reactive to light.    Lungs: scattered rhonchi   Heart: RRR   Chest: Symmetric chest wall expansion.    Abdomen: soft ,non tender,no hepatosplenomegaly   Neurological: No focal deficit   Extremities: No edema    Laboratory And Diagnostic Studies:     Recent Labs   [facility] 07/03/12 0434    WBC 1.69*    HGB 10.0*    HCT 29.9*    PLT 141     No results found for this basename: NA:2,K:2,CL:2,CO2:2,BUN:2,CREAT:2,GLU:2,CA:2 in the last 72 hours  No results found for this basename: AST:2,ALT:2,ALKPHOS:2,PROT:2,ALB:2 in the last 72 hours    Current Meds:      Scheduled  Meds: PRN Meds:           allopurinol 100 mg Oral TID   enoxaparin 40 mg Subcutaneous Q24H   lactobacillus/streoptococcus 1 capsule Oral Daily   [DISCONTINUED] doxycycline 100 mg Oral Q12H SCH       Continuous Infusions:         acetaminophen 650 mg Q4H PRN   ALPRAZolam 0.25 mg QHS PRN   morphine 2 mg Q2H PRN   naloxone 0.2 mg PRN         Cori Justus A. Janalyn Rouse, M.D.  07/05/2012  10:46 AM

## 2012-07-05 NOTE — Plan of Care (Signed)
Problem: Psychosocial and Spiritual Needs  Goal: Demonstrates ability to cope with hospitalization/illness  Outcome: Progressing  Pt has several friends visiting throughout shift for emotional support.  Explained she can call pastoral care at any time.  Wants to speak with [facility] (order placed) and/or Lyn before starting chemo.    Comments:   Pt does have some itchiness on chest near taped edges.

## 2012-07-05 NOTE — Plan of Care (Signed)
Problem: Pain  Goal: Patient's pain/discomfort is manageable  Outcome: Progressing  Pt Alert and Oriented X3.  Denies having pain or any discomfort at present:vital sign stable slept for most part of the night.  Will continue to monitor pt for any changes in status.    Problem: Protective Precautions [in description add neutropenic precautions]  Goal: Free from nosocomial infections  Outcome: Progressing  Pt is still on neutropenic isolation mask and gown worn before going to pt's room.  Will maintain pt on isolation.

## 2012-07-05 NOTE — Progress Notes (Signed)
Daily PROGRESS NOTE    Date Time: 07/05/2012 12:33 PM  Patient Name: [name], [name]  Patient Status: Inpatient  Hospital Day: 10    Assessment:   Acute Myeloid  Leukemia  Neutropenia   Pancytopenia.    Weight loss with a body mass index of 18 shows mild to moderate   Malnutrition   Skin rash- it is new onset   Diarrhea      Plan:   1. Arac C and idarubicin on Monday   2. Bone Marrow with Blast cells- waiting for FISH and cytogenetics   3. Monitor rash   4. Stool c.diff toxin is negative  5. Patient is SP groshong catheter   6. Monitor rash    Subjective:   Patient denies any acute complaints - no fever lately       Medications:     Current Facility-Administered Medications   Medication Dose Route Frequency   . allopurinol  100 mg Oral TID   . enoxaparin  40 mg Subcutaneous Q24H   . lactobacillus/streoptococcus  1 capsule Oral Daily   . [DISCONTINUED] doxycycline  100 mg Oral Q12H [facility]          acetaminophen 650 mg Q4H PRN   ALPRAZolam 0.25 mg QHS PRN   morphine 2 mg Q2H PRN   naloxone 0.2 mg PRN        Physical Exam:     Filed Vitals:    07/05/12 0708   BP: 112/58   Pulse: 70   Temp: 98.3 F (36.8 C)   Resp: 16   SpO2: 98%       Intake and Output Summary (Last 24 hours) at Date Time    Intake/Output Summary (Last 24 hours) at 07/05/12 1233  Last data filed at 07/04/12 1819   Gross per 24 hour   Intake    400 ml   Output      2 ml   Net    398 ml       Physical Exam   Constitutional: She is oriented to person, place, and time. She appears well-developed.   HENT:   Head: Normocephalic and atraumatic.   Eyes: Pupils are equal, round, and reactive to light.   Neck: No JVD present. No tracheal deviation present. No thyromegaly present.   Cardiovascular: Normal rate and normal heart sounds.    Pulmonary/Chest: No respiratory distress. She has no wheezes. She has no rales.   Abdominal: She exhibits no distension. There is no tenderness. There is no rebound and no guarding.   Musculoskeletal: She exhibits no edema.    Neurological: She is alert and oriented to person, place, and time.   Rash on front of chest, it is faint           Labs:     Results     Procedure Component Value Units Date/Time    Ehrlichia chaffeensis Aby IgG IgM [[id number]] Collected:06/30/12 1805     Ehrlichia chaffeensis IgM <1:20 Updated:07/04/12 1430     Ehrlichia chaffeensis, IgG <1:64      Interpretation: SEE BELOW             Rads:            Signed by: Peyton Najjar, MD  Pager: [phone]

## 2012-07-06 LAB — HEMOLYSIS INDEX: Hemolysis Index: 16 (ref 0–18)

## 2012-07-06 LAB — MAN DIFF ONLY
Band Neutrophils Absolute: 0.03 (ref 0.00–1.00)
Band Neutrophils: 2 %
Basophils Absolute Manual: 0.02 (ref 0.00–0.20)
Basophils Manual: 1 %
Eosinophils Absolute Manual: 0.03 (ref 0.00–0.70)
Eosinophils Manual: 2 %
Lymphocytes Absolute Manual: 0.95 (ref 0.50–4.40)
Lymphocytes Manual: 60 %
Monocytes Absolute: 0 (ref 0.00–1.20)
Monocytes Manual: 0 %
Neutrophils Absolute Manual: 0.55 — ABNORMAL LOW (ref 1.80–8.10)
Nucleated RBC: 0 (ref 0–1)
Segmented Neutrophils: 35 %

## 2012-07-06 LAB — RICKETTSIA ANTIBODY PANEL WITH RFLX TITERS
Rickettsia typhi, IgG: NOT DETECTED
Rickettsia typhi, IgM: NOT DETECTED
Rocky Mountain spotted fever (RMSF), IgG: NOT DETECTED
Rocky Mountain spotted fever (RMSF), IgM: NOT DETECTED

## 2012-07-06 LAB — BASIC METABOLIC PANEL
Anion Gap: 9 (ref 5.0–15.0)
BUN: 17 mg/dL (ref 7.0–19.0)
CO2: 28 (ref 22–29)
Calcium: 10.1 mg/dL (ref 8.5–10.5)
Chloride: 104 (ref 98–107)
Creatinine: 0.6 mg/dL (ref 0.6–1.0)
Glucose: 99 mg/dL (ref 70–100)
Potassium: 4.2 (ref 3.5–5.1)
Sodium: 141 (ref 136–145)

## 2012-07-06 LAB — CBC AND DIFFERENTIAL
Hematocrit: 29.6 % — ABNORMAL LOW (ref 37.0–47.0)
Hgb: 9.8 g/dL — ABNORMAL LOW (ref 12.0–16.0)
MCH: 32.3 pg — ABNORMAL HIGH (ref 28.0–32.0)
MCHC: 33.1 g/dL (ref 32.0–36.0)
MCV: 97.7 fL (ref 80.0–100.0)
MPV: 10 fL (ref 9.4–12.3)
Platelets: 200 (ref 140–400)
RBC: 3.03 — ABNORMAL LOW (ref 4.20–5.40)
RDW: 13 % (ref 12–15)
WBC: 1.58 — ABNORMAL LOW (ref 3.50–10.80)

## 2012-07-06 LAB — GFR: EGFR: >60

## 2012-07-06 LAB — LYME AB, TOTAL,REFLEX TO WESTERN BLOT (IGG & IGM): Lyme Disease AB Screen: <=0.9 (ref <=0.90)

## 2012-07-06 LAB — URIC ACID: Uric acid: 1.6 mg/dL — ABNORMAL LOW (ref 2.6–6.0)

## 2012-07-06 LAB — CELL MORPHOLOGY: Cell Morphology: NORMAL

## 2012-07-06 MED ORDER — EPINEPHRINE HCL 1 MG/ML IJ SOLN
0.3000 mg | INTRAMUSCULAR | Status: DC | PRN
Start: 2012-07-06 — End: 2012-07-28

## 2012-07-06 MED ORDER — SODIUM CHLORIDE 0.9 % IV SOLN
INTRAVENOUS | Status: DC
Start: 2012-07-06 — End: 2012-07-28

## 2012-07-06 MED ORDER — DEXAMETHASONE SODIUM PHOSPHATE 4 MG/ML IJ SOLN (WRAP)
8.0000 mg | Freq: Every day | INTRAMUSCULAR | Status: DC
Start: 2012-07-06 — End: 2012-07-06

## 2012-07-06 MED ORDER — DEXAMETHASONE SODIUM PHOSPHATE 4 MG/ML IJ SOLN (WRAP)
8.00 mg | Freq: Every day | INTRAMUSCULAR | Status: AC
Start: 2012-07-09 — End: 2012-07-12

## 2012-07-06 MED ORDER — HYDROCORTISONE SOD SUCCINATE 100 MG IJ SOLR
100.00 mg | INTRAMUSCULAR | Status: DC | PRN
Start: 2012-07-06 — End: 2012-07-28
  Administered 2012-07-26 (×2): 100 mg via INTRAVENOUS
  Filled 2012-07-06 (×2): qty 2

## 2012-07-06 MED ORDER — DEXAMETHASONE 4 MG PO TABS
12.0000 mg | ORAL_TABLET | Freq: Every day | ORAL | Status: AC
Start: 2012-07-06 — End: 2012-07-08
  Administered 2012-07-06 – 2012-07-08 (×3): 12 mg via ORAL
  Filled 2012-07-06 (×3): qty 3

## 2012-07-06 MED ORDER — DEXAMETHASONE 4 MG PO TABS
8.0000 mg | ORAL_TABLET | Freq: Every day | ORAL | Status: DC
Start: 2012-07-06 — End: 2012-07-06

## 2012-07-06 MED ORDER — LORAZEPAM 1 MG PO TABS
1.00 mg | ORAL_TABLET | ORAL | Status: DC | PRN
Start: 2012-07-06 — End: 2012-07-28

## 2012-07-06 MED ORDER — LORAZEPAM 2 MG/ML IJ SOLN
1.00 mg | INTRAMUSCULAR | Status: DC | PRN
Start: 2012-07-06 — End: 2012-07-28
  Administered 2012-07-27: 1 mg via INTRAVENOUS
  Filled 2012-07-06: qty 1

## 2012-07-06 MED ORDER — EPINEPHRINE HCL 0.1 MG/ML IJ SOLN
0.1000 mg | INTRAMUSCULAR | Status: DC | PRN
Start: 2012-07-06 — End: 2012-07-28

## 2012-07-06 MED ORDER — DIPHENHYDRAMINE-ZINC ACETATE 2-0.1 % EX CREA
TOPICAL_CREAM | Freq: Three times a day (TID) | CUTANEOUS | Status: DC | PRN
Start: 2012-07-06 — End: 2012-07-28
  Filled 2012-07-06: qty 28.4

## 2012-07-06 MED ORDER — SODIUM CHLORIDE 0.9 % IV SOLN
100.0000 mg/m2 | INTRAVENOUS | Status: AC
Start: 2012-07-06 — End: 2012-07-13
  Administered 2012-07-06 – 2012-07-12 (×7): 150 mg via INTRAVENOUS
  Filled 2012-07-06 (×8): qty 1.5

## 2012-07-06 MED ORDER — DEXAMETHASONE 4 MG PO TABS
8.0000 mg | ORAL_TABLET | Freq: Every day | ORAL | Status: AC
Start: 2012-07-09 — End: 2012-07-12
  Administered 2012-07-09 – 2012-07-12 (×4): 8 mg via ORAL
  Filled 2012-07-06 (×4): qty 2

## 2012-07-06 MED ORDER — IDARUBICIN HCL 1 MG/ML IV SOLN (WRAP)
12.00 mg/m2 | INTRAVENOUS | Status: AC
Start: 2012-07-06 — End: 2012-07-08
  Administered 2012-07-06 – 2012-07-08 (×3): 18 mg via INTRAVENOUS
  Filled 2012-07-06 (×3): qty 18

## 2012-07-06 MED ORDER — ONDANSETRON HCL 8 MG PO TABS
16.00 mg | ORAL_TABLET | Freq: Every day | ORAL | Status: AC
Start: 2012-07-06 — End: 2012-07-08
  Administered 2012-07-06 – 2012-07-08 (×3): 16 mg via ORAL
  Filled 2012-07-06 (×3): qty 2

## 2012-07-06 MED ORDER — SODIUM CHLORIDE 0.9 % IV SOLN
200.0000 mL/h | INTRAVENOUS | Status: DC | PRN
Start: 2012-07-06 — End: 2012-07-28

## 2012-07-06 MED ORDER — DIPHENHYDRAMINE HCL 50 MG/ML IJ SOLN
50.0000 mg | INTRAMUSCULAR | Status: DC | PRN
Start: 2012-07-06 — End: 2012-07-28
  Administered 2012-07-18: 50 mg via INTRAVENOUS
  Filled 2012-07-06: qty 1

## 2012-07-06 NOTE — Progress Notes (Signed)
HEMATOLOGY ONCOLOGY PROGRESS NOTE    Date Time: 07/06/2012 8:51 AM  Patient Name: [name],[name]      IMPRESSION:     Patient Active Problem List   Diagnosis   . Chondromalacia of patella   . Neutropenic fever         PLAN:  She has been started on allopurinol and will begin hydration this AM.  The rationale for using and side effects associated with chemotherapy to include alopecia, mucositis, bone marrow suppression and other were reviewed.  She will begin standard 7+3 today.  All questions were answered.  Her ejection fraction is 79%.  Please see orders.             HISTORY: [name] is a 57 year old who was in her usual state of excellent health until approximately 5 days prior to admission when she began to experience arthralgias, myalgias, and some rhinorrhea. She was noted to have temperature   elevations to as high as 102 and 103 degrees.  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  Cytogenetics and FISH are pending.         PHYSICAL EXAM:     Filed Vitals:    07/06/12 0649   BP: 109/55   Pulse: 69   Temp: 98.4 F (36.9 C)   Resp: 18   SpO2: 98%       Intake and Output Summary (Last 24 hours) at Date Time    Intake/Output Summary (Last 24 hours) at 07/06/12 0851  Last data filed at 07/05/12 1807   Gross per 24 hour   Intake    600 ml   Output      3 ml   Net    597 ml       Appearance: in no acute distress  HEENT: pharynx clear  Lungs: clear to ausculation  Heart: regular rate and rhythm, normal heart sounds  Abdomen: flat and soft, active bowel sounds  Extremities: no edema  Neuro: alert, appropriate  MEDS:   Scheduled Meds:  Current Facility-Administered Medications   Medication Dose Route Frequency   . allopurinol  100 mg Oral TID   . enoxaparin  40 mg Subcutaneous Q24H   . lactobacillus/streoptococcus  1 capsule Oral Daily     Continuous Infusions:   PRN Meds:.acetaminophen, ALPRAZolam, morphine, naloxone    LABS:     Results     ** No Results found for the last 24 hours.  **          IMAGING DATA:  Xr Chest 2 Views    06/26/2012   CLINICAL INDICATION: pneumonia  COMPARISON: None available  FINDINGS:   2 views of the chest were obtained. The lungs are clear. The heart and vascularity are within normal limits.  There is no pleural thickening or effusion. The osseous structures are unremarkable.      06/26/2012   No active cardiopulmonary disease   Heron Nay, MD  06/26/2012 11:12 PM     Chest 2 Views    06/25/2012  CLINICAL INDICATION: Fever  COMPARISON: None.  INTERPRETATION: Frontal and lateral views of the chest obtained. Cardiomediastinal contour within normal limits for age. Clear lungs and sharp sulci.           06/25/2012   No acute cardiopulmonary process.  Mitali  Bapna, MD  06/25/2012 10:43 PM     Ct Sinus Facial Bones Wo Contrast    06/28/2012  HISTORY: Sinusitis  COMPARISON: None.  TECHNIQUE: Axial  CT scans slices of the paranasal sinuses and facial bones with sagittal and coronal reformats  FINDINGS: No fracture or other bony abnormality. The sinuses are clear. The ostiomeatal complexes are patent. There is no mass or nasopharynx. There is a left concha bullosa. The nasal septum is midline.      06/28/2012   Normal  Lynnae Prude, MD  06/28/2012 2:42 PM     Ct Chest Wo Contrast    06/28/2012  HISTORY: Cough  COMPARISON: None  TECHNIQUE:  Helical CT scan of the chest was obtained from the apices to the lung bases without intravenous contrast.      FINDINGS:  Lungs and central airways: Atelectasis or scarring in the lower lobes. Pleura: Trace effusions bilaterally. Aorta and Great Vessels: Within normal limits. Heart: No pericardial effusion. No coronary artery calcification. Mediastinum and hila: No mass or adenopathy Upper Abdomen: No significant abnormality. Bones: No significant abnormality. Chest wall and lower neck: Normal       06/28/2012   Atelectasis or scarring in the lower lobes. Trace bilateral pleural effusions.  Lynnae Prude, MD  06/28/2012 2:47 PM     Nm Cardiac Muga  (at Rest)    07/02/2012  CLINICAL INDICATION: Prechemotherapy evaluation. Leukemia.  TECHNIQUE: A dynamic MUGA scan was obtained. Imaging obtained in the LAO, lateral, and anterior projections. The left ventricle region of interest was drawn, on the best separation view, in end-diastole and end-systole, and ejection fraction was calculated. 23.4 mCi of Tc42m labeled autologous RBCs, using the Ultratag technique was administered.  FINDINGS: Normal wall motion is seen in the left ventricle. The ejection fraction is calculated at 79%, which is normal.      07/02/2012   NORMAL EJECTION FRACTION, OF 79%.  Darnelle Maffucci, MD  07/02/2012 2:23 PM     Biopsy, Bone Marrow    06/29/2012  EXAMINATION: Fluoroscopic guided bone marrow aspirate and core biopsy.  INTERVENTIONALIST: Verlee Rossetti, MD.  HISTORY:  Patient is a 57 year old female with pancytopenia and neutropenic fever.  Anesthesia: Moderate sedation with Fentanyl and Versed was administered with appropriate physiologic monitoring using an independent trained observer for 30 minutes. 1% lidocaine local was administered as well.  TECHNIQUE: With the patient in the prone position the skin over the left iliac crest was prepped and draped in usual sterile fashion. Local anesthesia was applied to superficial and deep tissues with 2% lidocaine. Subsequently, under direct fluoroscopic guidance, a bone marrow needle was advanced to the posteromedial aspect of the superior left iliac crest. The outer cortex was traversed and bone marrow aspiration performed and the needle removed.    The bone marrow core needle was then advanced to a separate site on the iliac crest to obtain a core of bone marrow tissue. The samples were given to hematology for analysis. The patient tolerated procedure well without apparent competition.  INTERPRETATION: Fluoroscopy revealed successful access to the left iliac crest marrow cavity for bone marrow aspiration and for core biopsy.  Fluoroscopy Time: 0.3  min      06/29/2012   Technically successful fluoroscopic guided bone marrow aspirate and core biopsy for pancytopenia.  Larrie Kass, MD  06/29/2012 4:21 PM     Tunneled Cath Placement (permcath)    07/03/2012   Clinical history: Power line  Interventionalist: Dara Lords, MD  Informed consent obtained and all questions answered.  Anesthesia:  Moderate sedation with appropriate monitoring plus local. This consisted of Versed and fentanyl administration with continuous hemodynamic  monitoring with a trained observer. Total anesthesia time with direct physician supervision 20 minutes.  Procedure: After standard prepping and draping with maximal sterile barrier technique and local anesthesia with 2% Xylocaine, percutaneous puncture of the right internal jugular vein is performed under ultrasound guidance with [place] image obtained for the medical record. The guidewire is advanced under fluoroscopic guidance. Dilator is advanced over the wire and left in place and flushed.  A double-lumen power line cuffed catheter is tunneled from the venotomy site to an exit site on the right anterior chest wall after prior infiltration of the right anterior chest wall with Xylocaine plus epinephrine.  The dilator at the venotomy site is exchanged for a peel-away sheath. The double-lumen power line (equivalent to Groshong) catheter is advanced through the peel-away sheath. The peel-away sheath was removed. The catheter length is adjusted to position the catheter tip in the superior vena cava.  The catheter secured in place with 2-0 Prolene suture. The venotomy site was closed with  Dermabond.  Complications: None apparent.  Fluoroscopy time:0.3 minutes  Findings: The right internal jugular vein is patent by US imaging. An [place] image is obtained for the medical record. Catheter tip is in the superior vena cava.      07/03/2012    successful ultrasound and fluoroscopically guided placement of a right internal jugular dual-lumen cuffed power  line catheter. The catheter may be used immediately.  Dara Lords, MD  07/03/2012 2:42 PM                       Larene Pickett, MD  [facility]    [phone]

## 2012-07-06 NOTE — Plan of Care (Signed)
Problem: Infection/Potential for Infection  Goal: Free from infection  Outcome: Progressing  Pt is S/p right chest Groshong catheter.  Pt is for possible chemotherapy administration week. Vital sign stable.  Will maintain pt on isolation.    Problem: Protective Precautions [in description add neutropenic precautions]  Goal: Free from nosocomial infections  Outcome: Progressing  Pt  On Neutropenic isolation, pt showed me a rash to her chest and stated that Dr Lorel Monaco is aware of it. Will inform morning nurse to follow-up with Dr Lorel Monaco regarding pt's rash.

## 2012-07-06 NOTE — Consults (Signed)
Nutritional Support Services  Nutrition Follow-up    [name] 57 y.o. female   MRN: [id number]    Summary of Nutrition Recommendations:  1) Continue current diet order    2) Ensure Plus TID    Nutrition Dx: Moderate malnutrition related to acute/chronic illness as evidenced by 87% UBW of 115 lb, 15 lb weight loss noted compared to usual body weight (13%) considered a significant amount, visible low muscle tone  -----------------------------------------------------------------------------------------------------------------                                                           ASSESSMENT DATA     Nutrition: MD Consult for poor appetite.  RD saw pt (6/20), pt with moderate malnutrition and increased nutrient needs.  Pt started on chemotherapy- possible nutrition related side effect is mucositis.  Pt is aware of the need for high calorie/protein diet.    Events of Current Admission: Pt diagnosed with leukemia during this admission, (6/20) tunneled central line placed, chemotherapy started today  Medical Hx:  has a past medical history of Asthma without status asthmaticus.  Social Hx: alcohol socially      Current Diet Order  Diet neutropenic - low bacteria  Intake: Pt with Ensure Plus BID, pt states she is drinking sometimes 3 per day.  Pt reports a good appetite at this time, eating most of her meals.  Pt likes the Ensure Plus- and is drinking throughout the day over ice.     ANTHROPOMETRIC  Anthropometrics  Height: 165.1 cm (5\' 5" )  Weight: 45.36 kg (100 lb)  Weight Change: -9.91   IBW/kg (Calculated) Female: 61.83 kg  IBW/kg (Calculated) Female: 56.81 kg  BMI (calculated): 18.5     UBW= 115 lb.  Pt is currently 87% UBW of 115 lb    Physical Appearance:   Skin: none noted  GI function: C.Diff negative, no current diarrhea    ESTIMATED NEEDS  Estimated Energy Needs  Total Energy Estimated Needs: 5409-8119 kcal/day  Method for Estimating Needs: 30-35 kcal/kg    Estimated Protein Needs  Total Protein Estimated  Needs: 50-65 gm pro/day  Method for Estimating Needs: 1-1.3 gm pro/kg    Fluid Needs  Total Fluid Estimated Needs: ~1500 ml/day or per MD  Method for Estimating Needs: ~30 ml/kg    Pertinent Medications: decadron, risaquad  IVF:       . sodium chloride 100 mL/hr at 07/06/12 0931   . sodium chloride         Pertinent labs:  Lab 07/06/12 1402 07/03/12 0434 07/02/12 0651 07/01/12 0425   NA 141 -- -- --   K 4.2 -- -- --   CL 104 -- -- --   CO2 28 -- -- --   BUN 17.0 -- -- --   CREAT 0.6 -- -- --   GLU 99 -- -- --   CA 10.1 -- -- --   MG -- -- -- --   PHOS -- -- -- --   EGFR >60.0 -- -- --   WBC 1.58* 1.69* 1.62* 1.28*   HCT 29.6* 29.9* 29.1* 30.7*   HGB 9.8* 10.0* 10.0* 10.2*   TRIG -- -- -- --   AMY -- -- -- --   LIP -- -- -- --       Learning  Needs: Cancer booklet given to pt during previous RD visit (6/20)                                                    NUTRITION DIAGNOSIS     Increased nutrient needs related to medical condition- Leukemia as evidenced by increased estimated needs     Moderate malnutrition related to acute/chronic illness as evidenced by 87% UBW of 115 lb, 15 lb weight loss noted compared to usual body weight (13%) considered a significant amount, visible low muscle tone                                                           INTERVENTION     Goal: Obtain adequate nutrition and halt weight loss    1) Continue current diet order    2) Ensure Plus TID    3) Add [place] yogurt- when available daily                                                      MONITOR/EVALUATION     Monitor po intake, nutritional side effects of chemotherapy, GI fxn, labs, weight    RD to follow pt at high-mod nutrition risk      Betrice Wanat L. Lenon Ahmadi, RD, LD  Spectralink x [phone]  Nutrition Office x [phone]

## 2012-07-06 NOTE — Progress Notes (Signed)
Infectious Diseases & Tropical Medicine  Progress Note    07/06/2012   [name] ZOX:[id number]:[id number] is a 57 y.o. female,       Assessment:      acute leukemia  Rash likely contact dermatitis  Blood cultures -NGTD  S/P  Groshong catheter placement  MUGA scan-ejection fraction 79%   Lyme antibody test negative  Parvovirus IgM antibody is negative (IgG antibodies-2.0)  Stool studies pending  Clinically stable  Afebrile  Neutropenia / anemia    Plan:      Started on chemotherapy today   Start antibiotics if any signs of infection   Monitor liver function tests closely   Continue probiotics   Reviewed notes    ROS:     General:   Fever resolved, no chills, no rigor,awake,alert  HEENT: no neck pain, no throat pain  Endocrine:c/o fatigue   Respiratory: no cough,no shortness of breath, or wheezing   Cardiovascular: no chest pain   Gastrointestinal: no abdominal pain,no N/V/D  Genito-Urinary: no dysuria, trouble voiding, or hematuria   Musculoskeletal: no edema, myalgias  Neurological: c/o generalized weakness, insomnia  Dermatological: maculopapular rash on the back improving and some on the anterior chest, no ulcer    Physical Examination:     Blood pressure 109/55, pulse 69, temperature 98.4 F (36.9 C), temperature source Oral, resp. rate 18, height 1.651 m (5\' 5" ), weight 45.36 kg (100 lb), SpO2 98.00%.     General Appearance: Comfortable, and in no acute distress. Awake,alert   HEENT: Pupils are equal, round, and reactive to light.    Lungs:  Clear to auscultation   Heart: RRR   Chest: Symmetric chest wall expansion.    Abdomen: soft ,non tender,no hepatosplenomegaly   Neurological: No focal deficit   Extremities: No edema    Laboratory And Diagnostic Studies:     No results found for this basename: WBC:2,HGB:2,HCT:2,PLT:2 in the last 72 hours  No results found for this basename: NA:2,K:2,CL:2,CO2:2,BUN:2,CREAT:2,GLU:2,CA:2 in the last 72 hours  No results found for this basename:  AST:2,ALT:2,ALKPHOS:2,PROT:2,ALB:2 in the last 72 hours    Current Meds:      Scheduled Meds: PRN Meds:           allopurinol 100 mg Oral TID   enoxaparin 40 mg Subcutaneous Q24H   lactobacillus/streoptococcus 1 capsule Oral Daily       Continuous Infusions:         acetaminophen 650 mg Q4H PRN   ALPRAZolam 0.25 mg QHS PRN   morphine 2 mg Q2H PRN   naloxone 0.2 mg PRN         [name] A. Janalyn Rouse, M.D.  07/06/2012  8:51 AM

## 2012-07-06 NOTE — Progress Notes (Signed)
Daily PROGRESS NOTE    Date Time: 07/06/2012 8:24 PM  Patient Name: [name], [name]  Patient Status: Inpatient  Hospital Day: 11    Assessment:   Acute Leukemia, NOS   Neutropenic fever  . Pancytopenia.   . Weight loss with a body mass index of 18 shows mild to moderate   Malnutrition  . Skin rash.  Diarrhea      Plan:   1. Chemo initiated today includes Adriamycin  2. Bone Marrow with Blast cells  3. Neutropenic precaution  4. Stool c.diff toxin is negative  5. Chemo, Access placed     Subjective:   Aches, fatigue, Insomnia, Skin rash on the anterior chest  10 point Review of Systems - Negative except for the Positives mentioned above      Medications:     Current Facility-Administered Medications   Medication Dose Route Frequency   . allopurinol  100 mg Oral TID   . cytarabine (CYTOSAR) continuous infusion  100 mg/m2 (Order-Specific) Intravenous Q24H   . [START ON 07/09/2012] dexamethasone  8 mg Oral Daily    Or   . [START ON 07/09/2012] dexamethasone  8 mg Intravenous Daily   . ondansetron  16 mg Oral Daily    And   . dexamethasone  12 mg Oral Daily   . IDArubicin  12 mg/m2 (Order-Specific) Intravenous Q24H   . lactobacillus/streoptococcus  1 capsule Oral Daily   . [DISCONTINUED] dexamethasone  8 mg Intravenous Daily   . [DISCONTINUED] dexamethasone  8 mg Oral Daily   . [DISCONTINUED] enoxaparin  40 mg Subcutaneous Q24H          sodium chloride 200 mL/hr Continuous PRN   acetaminophen 650 mg Q4H PRN   ALPRAZolam 0.25 mg QHS PRN   diphenhydrAMINE 50 mg PRN   diphenhydrAMINE-zinc acetate  TID PRN   EPINEPHrine 0.3 mg PRN   EPINEPHrine 0.1 mg PRN   hydrocortisone 100 mg PRN   LORazepam 1 mg Q4H PRN   Or     LORazepam 1 mg Q4H PRN   morphine 2 mg Q2H PRN   naloxone 0.2 mg PRN        Physical Exam:     Filed Vitals:    07/06/12 1230   BP: 93/49   Pulse: 68   Temp: 98.6 F (37 C)   Resp: 16   SpO2: 98%       Intake and Output Summary (Last 24 hours) at Date Time  No intake or output data in the 24 hours ending  07/06/12 2024    Physical Exam   Constitutional: She is oriented to person, place, and time. She appears well-developed.   HENT:   Head: Normocephalic and atraumatic.   Eyes: Pupils are equal, round, and reactive to light.   Neck: No JVD present. No tracheal deviation present. No thyromegaly present.   Cardiovascular: Normal rate and normal heart sounds.    Pulmonary/Chest: No respiratory distress. She has no wheezes. She has no rales.   Abdominal: She exhibits no distension. There is no tenderness. There is no rebound and no guarding.   Musculoskeletal: She exhibits no edema.   Neurological: She is alert and oriented to person, place, and time.   Erythematous Maculopapular rash on anterior chest          Labs:     Results     Procedure Component Value Units Date/Time    Lyme Ab Tot Rflx to [facility] IGG/IGM [[id number]] Collected:06/30/12 1805    Specimen  Information:Blood Updated:07/06/12 1535     Progressive Lyme Not indicated      Lyme Disease AB Screen <=0.90     Rickettsia Aby Panel with Rflx Titers [[id number]] Collected:06/30/12 1805     [facility] spotted fever (RMSF), IgG Not Detected Updated:07/06/12 1535     RMSF IgG Antibodies Not indicated      [facility] spotted fever (RMSF), IgM Not Detected      RMSF IgM Antibodies Not indicated      Rickettsia typhi, IgG Not Detected      Typhus IgG Antibodies Not indicated      Rickettsia typhi, IgM Not Detected      Typhus IgM Antibodies Not indicated     CBC and differential [[id number]]  (Abnormal) Collected:07/06/12 1402    Specimen Information:Blood / Blood Updated:07/06/12 1532     WBC 1.58 (L)      RBC 3.03 (L)      Hgb 9.8 (L) g/dL      Hematocrit 08.6 (L) %      MCV 97.7 fL      MCH 32.3 (H) pg      MCHC 33.1 g/dL      RDW 13 %      Platelets 200      MPV 10.0 fL     Manual Differential [[id number]]  (Abnormal) Collected:07/06/12 1402     Segmented Neutrophils 35 % Updated:07/06/12 1532     Band Neutrophils 2 %      Lymphocytes Manual 60 %      Monocytes  Manual 0 %      Eosinophils Manual 2 %      Basophils Manual 1 %      Nucleated RBC 0      Abs Seg Manual 0.55 (L)      Bands Absolute 0.03      Absolute Lymph Manual 0.95      Monocytes Absolute 0.00      Absolute Eos Manual 0.03      Absolute Baso Manual 0.02     Cell MorpHology [[id number]] Collected:07/06/12 1402     Cell Morphology: Normal Updated:07/06/12 1532    Basic Metabolic Panel [[id number]] Collected:07/06/12 1402    Specimen Information:Blood Updated:07/06/12 1508     Glucose 99 mg/dL      BUN 64.4 mg/dL      Creatinine 0.6 mg/dL      CALCIUM 03.4 mg/dL      Sodium 742      Potassium 4.2      Chloride 104      CO2 28      Anion Gap 9.0     Uric acid [[id number]]  (Abnormal) Collected:07/06/12 1402    Specimen Information:Blood Updated:07/06/12 1508     Uric acid 1.6 (L) mg/dL     HEMOLYZED INDEX [[id number]] Collected:07/06/12 1402     Hemolyzed Index 16 Updated:07/06/12 1508    GFR [[id number]] Collected:07/06/12 1402     EGFR >60.0 Updated:07/06/12 1508            Rads:     Xr Chest 2 Views    06/26/2012   CLINICAL INDICATION: pneumonia  COMPARISON: None available  FINDINGS:   2 views of the chest were obtained. The lungs are clear. The heart and vascularity are within normal limits.  There is no pleural thickening or effusion. The osseous structures are unremarkable.      06/26/2012   No active cardiopulmonary disease   Heron Nay, MD  06/26/2012 11:12  PM     Chest 2 Views    06/25/2012  CLINICAL INDICATION: Fever  COMPARISON: None.  INTERPRETATION: Frontal and lateral views of the chest obtained. Cardiomediastinal contour within normal limits for age. Clear lungs and sharp sulci.           06/25/2012   No acute cardiopulmonary process.  Filbert Schilder, MD  06/25/2012 10:43 PM         Signed by: Eric Form, MD  Pager: [phone]

## 2012-07-06 NOTE — Plan of Care (Signed)
Problem: Hemodynamic Status: Immune Suppressed-Hematologic  Goal: Stable vital signs and fluid balance  Outcome: Progressing  Patient was pre hydrated with normal saline infusion and then was premedicated with Zofran and decadron prior to start of chemotherapy. Positive blood return noted in both ports of Hohn's catheter. Monico Hoar talked with patient. Consent obtained. Cytarabine started at 1400 and idarubicin given iv push for 10 minutes. Patient tolerated infusion. Vital signs stable. Family and friends visited.

## 2012-07-06 NOTE — Plan of Care (Signed)
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 Requesting information regarding hospital course and treatment regimen. Consent for chemotherapy signed. Education including discussing pancytopenia, use of blood products as needed for transfusion (RBC and Platelets). Length of chemotherapy infusions and need for hospitalization. Reviewed need for handwashing, body hygiene, adequate nutrition. Written information given to patient from [employer]ukemia [employer] society, [employer] with [employer] and specific chemotherapy information sheets. Patient wanted discussion while friend in the room.

## 2012-07-07 NOTE — Plan of Care (Signed)
Problem: Hemodynamic Status: Immune Suppressed-Hematologic  Goal: Stable vital signs and fluid balance  Outcome: Progressing  Pt denies any pain at this time. Call bell placed within reach. Cytarabine the first bag completed, no reaction noted. Right chest port flushed well and has an excellent blood return. Pt pre medicated and idarubicin iv push given as ordered, no reaction noted. Cytarabine the second bag started at 42 cc/ hr at 2 pm, no reaction noted. Denies any nausea. Appetite good. Pt ambulatory, self care. Out of bed to chair.

## 2012-07-07 NOTE — Progress Notes (Signed)
Infectious Diseases & Tropical Medicine  Progress Note    07/07/2012   [name] ZOX:[id number],XBJ:[id number] is a 57 y.o. female,       Assessment:     Acute leukemia  Rash   Blood cultures -NGTD  S/P  Groshong catheter placement  MUGA scan-ejection fraction 79%   Lyme antibody test negative  Parvovirus IgM antibody is negative (IgG antibodies-2.0)  Stool studies pending  Clinically stable  Afebrile  Neutropenia / anemia  Clinically stable    Plan:      Tolerating chemotherapy well    Start antibiotics if any signs of infection   Monitor liver function tests closely   Continue probiotics   Reviewed notes    ROS:     General:   Fever resolved, no chills, no rigor,awake,alert,sitting in the chair  HEENT: no neck pain, no throat pain  Endocrine:no fatigue   Respiratory: no cough,no shortness of breath, or wheezing   Cardiovascular: no chest pain   Gastrointestinal: no abdominal pain,no N/V,diarrhea yesterday - improved today  Genito-Urinary: no dysuria, trouble voiding, or hematuria   Musculoskeletal: no edema, myalgias  Neurological: c/o generalized weakness  Dermatological: maculopapular rash on the back improving and some on the anterior chest, no ulcer    Physical Examination:     Blood pressure 117/60, pulse 65, temperature 98 F (36.7 C), temperature source Oral, resp. rate 18, height 1.651 m (5\' 5" ), weight 45.36 kg (100 lb), SpO2 98.00%.     General Appearance: Comfortable, and in no acute distress. Awake,alert,feeling well   HEENT: Pupils are equal, round, and reactive to light.    Lungs:  Clear to auscultation   Heart: RRR   Chest: Symmetric chest wall expansion.minimal rash at anterior chest    Abdomen: soft ,non tender,no hepatosplenomegaly   Neurological: No focal deficit   Extremities: No edema    Laboratory And Diagnostic Studies:     Recent Labs   [facility] 07/06/12 1402    WBC 1.58*    HGB 9.8*    HCT 29.6*    PLT 200     Recent Labs   Basename 07/06/12 1402    NA 141    K 4.2    CL  104    CO2 28    BUN 17.0    CREAT 0.6    GLU 99    CA 10.1     No results found for this basename: AST:2,ALT:2,ALKPHOS:2,PROT:2,ALB:2 in the last 72 hours    Current Meds:      Scheduled Meds: PRN Meds:           allopurinol 100 mg Oral TID   cytarabine (CYTOSAR) continuous infusion 100 mg/m2 (Order-Specific) Intravenous Q24H   [START ON 07/09/2012] dexamethasone 8 mg Oral Daily   Or      [START ON 07/09/2012] dexamethasone 8 mg Intravenous Daily   ondansetron 16 mg Oral Daily   And      dexamethasone 12 mg Oral Daily   IDArubicin 12 mg/m2 (Order-Specific) Intravenous Q24H   lactobacillus/streoptococcus 1 capsule Oral Daily   [DISCONTINUED] enoxaparin 40 mg Subcutaneous Q24H       Continuous Infusions:       . sodium chloride 100 mL/hr at 07/07/12 0600   . sodium chloride           sodium chloride 200 mL/hr Continuous PRN   acetaminophen 650 mg Q4H PRN   ALPRAZolam 0.25 mg QHS PRN   diphenhydrAMINE 50 mg PRN   diphenhydrAMINE-zinc acetate  TID PRN   EPINEPHrine 0.3 mg PRN   EPINEPHrine 0.1 mg PRN   hydrocortisone 100 mg PRN   LORazepam 1 mg Q4H PRN   Or     LORazepam 1 mg Q4H PRN   morphine 2 mg Q2H PRN   naloxone 0.2 mg PRN         Tyheem Boughner A. Janalyn Rouse, M.D.  07/07/2012  10:21 AM

## 2012-07-07 NOTE — Plan of Care (Signed)
Problem: Protective Precautions [in description add neutropenic precautions]  Goal: Free from nosocomial infections  Outcome: Progressing  Alert & oriented x 3 while awake. Ambulates independently in room. VSS. Friends in visiting during evening hours. Neutropenic precautions  maintained.  Rash resolving around right chest central line. Continuous cytarabine infusion maintained without incident at 42cc/hr & NS at  100cc/hr. Call bell at hand. Hourly rounds.

## 2012-07-07 NOTE — Plan of Care (Signed)
Problem: Infection/Potential for Infection  Goal: Free from infection  Outcome: Progressing  Neutropenic precaution maintained.

## 2012-07-07 NOTE — Progress Notes (Addendum)
HEMATOLOGY ONCOLOGY PROGRESS NOTE    Date Time: 07/07/2012 8:56 AM  Patient Name: [name],[name]      IMPRESSION:     Patient Active Problem List   Diagnosis   . Chondromalacia of patella   . Neutropenic fever         PLAN: No change will be made in current medications,  Labs are pending.  She will be transfused as per parameters normally followed.  Await cytogenetics and FISH.  She is bcr/abl negative.             HISTORY: Ms. Swetz is a 57 year old who was in her usual state of excellent health until approximately 5 days prior to admission when she began to experience arthralgias, myalgias, and some rhinorrhea. She was noted to have temperature   elevations to as high as 102 and 103 degrees. 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 Cytogenetics and FISH are pending.  Interval History:  She started 7+3 yesterday.  Today is day 2.  In general she tolerated chemotherapy well.  ROS remarkable for fatigue.         PHYSICAL EXAM:     Filed Vitals:    07/07/12 0500   BP: 125/68   Pulse: 67   Temp: 97.5 F (36.4 C)   Resp: 18   SpO2: 99%       Intake and Output Summary (Last 24 hours) at Date Time    Intake/Output Summary (Last 24 hours) at 07/07/12 0856  Last data filed at 07/07/12 0400   Gross per 24 hour   Intake   1778 ml   Output      0 ml   Net   1778 ml       Appearance: in no acute distress  HEENT: pharynx clear  Lungs: clear to ausculation  Heart: regular rate and rhythm, normal heart sounds  Abdomen: flat and soft, active bowel sounds  Extremities: no edema  Neuro: alert, appropriate  MEDS:   Scheduled Meds:  Current Facility-Administered Medications   Medication Dose Route Frequency   . allopurinol  100 mg Oral TID   . cytarabine (CYTOSAR) continuous infusion  100 mg/m2 (Order-Specific) Intravenous Q24H   . [START ON 07/09/2012] dexamethasone  8 mg Oral Daily    Or   . [START ON 07/09/2012] dexamethasone  8 mg Intravenous Daily   . ondansetron  16 mg Oral Daily    And   .  dexamethasone  12 mg Oral Daily   . IDArubicin  12 mg/m2 (Order-Specific) Intravenous Q24H   . lactobacillus/streoptococcus  1 capsule Oral Daily   . [DISCONTINUED] dexamethasone  8 mg Intravenous Daily   . [DISCONTINUED] dexamethasone  8 mg Oral Daily   . [DISCONTINUED] enoxaparin  40 mg Subcutaneous Q24H     Continuous Infusions:     . sodium chloride 100 mL/hr at 07/07/12 0600   . sodium chloride       PRN Meds:.sodium chloride, acetaminophen, ALPRAZolam, diphenhydrAMINE, diphenhydrAMINE-zinc acetate, EPINEPHrine, EPINEPHrine, hydrocortisone, LORazepam, LORazepam, morphine, naloxone    LABS:     Results     Procedure Component Value Units Date/Time    Lyme Ab Tot Rflx to [facility] IGG/IGM [[id number]] Collected:06/30/12 1805    Specimen Information:Blood Updated:07/06/12 1535     Progressive Lyme Not indicated      Lyme Disease AB Screen <=0.90     Rickettsia Aby Panel with Rflx Titers [[id number]] Collected:06/30/12 1805     [place] spotted fever (RMSF),  IgG Not Detected Updated:07/06/12 1535     RMSF IgG Antibodies Not indicated      [facility] spotted fever (RMSF), IgM Not Detected      RMSF IgM Antibodies Not indicated      Rickettsia typhi, IgG Not Detected      Typhus IgG Antibodies Not indicated      Rickettsia typhi, IgM Not Detected      Typhus IgM Antibodies Not indicated     CBC and differential [[id number]]  (Abnormal) Collected:07/06/12 1402    Specimen Information:Blood / Blood Updated:07/06/12 1532     WBC 1.58 (L)      RBC 3.03 (L)      Hgb 9.8 (L) g/dL      Hematocrit 40.9 (L) %      MCV 97.7 fL      MCH 32.3 (H) pg      MCHC 33.1 g/dL      RDW 13 %      Platelets 200      MPV 10.0 fL     Manual Differential [[id number]]  (Abnormal) Collected:07/06/12 1402     Segmented Neutrophils 35 % Updated:07/06/12 1532     Band Neutrophils 2 %      Lymphocytes Manual 60 %      Monocytes Manual 0 %      Eosinophils Manual 2 %      Basophils Manual 1 %      Nucleated RBC 0      Abs Seg Manual 0.55 (L)       Bands Absolute 0.03      Absolute Lymph Manual 0.95      Monocytes Absolute 0.00      Absolute Eos Manual 0.03      Absolute Baso Manual 0.02     Cell MorpHology [[id number]] Collected:07/06/12 1402     Cell Morphology: Normal Updated:07/06/12 1532    Basic Metabolic Panel [[id number]] Collected:07/06/12 1402    Specimen Information:Blood Updated:07/06/12 1508     Glucose 99 mg/dL      BUN 52.8 mg/dL      Creatinine 0.6 mg/dL      CALCIUM 41.3 mg/dL      Sodium 244      Potassium 4.2      Chloride 104      CO2 28      Anion Gap 9.0     Uric acid [[id number]]  (Abnormal) Collected:07/06/12 1402    Specimen Information:Blood Updated:07/06/12 1508     Uric acid 1.6 (L) mg/dL     HEMOLYZED INDEX [[id number]] Collected:07/06/12 1402     Hemolyzed Index 16 Updated:07/06/12 1508    GFR [[id number]] Collected:07/06/12 1402     EGFR >60.0 Updated:07/06/12 1508          IMAGING DATA:  Xr Chest 2 Views    06/26/2012   CLINICAL INDICATION: pneumonia  COMPARISON: None available  FINDINGS:   2 views of the chest were obtained. The lungs are clear. The heart and vascularity are within normal limits.  There is no pleural thickening or effusion. The osseous structures are unremarkable.      06/26/2012   No active cardiopulmonary disease   Heron Nay, MD  06/26/2012 11:12 PM     Chest 2 Views    06/25/2012  CLINICAL INDICATION: Fever  COMPARISON: None.  INTERPRETATION: Frontal and lateral views of the chest obtained. Cardiomediastinal contour within normal limits for age. Clear lungs and sharp sulci.  06/25/2012   No acute cardiopulmonary process.  Mitali  Bapna, MD  06/25/2012 10:43 PM     Ct Sinus Facial Bones Wo Contrast    06/28/2012  HISTORY: Sinusitis  COMPARISON: None.  TECHNIQUE: Axial CT scans slices of the paranasal sinuses and facial bones with sagittal and coronal reformats  FINDINGS: No fracture or other bony abnormality. The sinuses are clear. The ostiomeatal complexes are patent. There is no mass or nasopharynx.  There is a left concha bullosa. The nasal septum is midline.      06/28/2012   Normal  Lynnae Prude, MD  06/28/2012 2:42 PM     Ct Chest Wo Contrast    06/28/2012  HISTORY: Cough  COMPARISON: None  TECHNIQUE:  Helical CT scan of the chest was obtained from the apices to the lung bases without intravenous contrast.      FINDINGS:  Lungs and central airways: Atelectasis or scarring in the lower lobes. Pleura: Trace effusions bilaterally. Aorta and Great Vessels: Within normal limits. Heart: No pericardial effusion. No coronary artery calcification. Mediastinum and hila: No mass or adenopathy Upper Abdomen: No significant abnormality. Bones: No significant abnormality. Chest wall and lower neck: Normal       06/28/2012   Atelectasis or scarring in the lower lobes. Trace bilateral pleural effusions.  Lynnae Prude, MD  06/28/2012 2:47 PM     Nm Cardiac Muga (at Rest)    07/02/2012  CLINICAL INDICATION: Prechemotherapy evaluation. Leukemia.  TECHNIQUE: A dynamic MUGA scan was obtained. Imaging obtained in the LAO, lateral, and anterior projections. The left ventricle region of interest was drawn, on the best separation view, in end-diastole and end-systole, and ejection fraction was calculated. 23.4 mCi of Tc12m labeled autologous RBCs, using the Ultratag technique was administered.  FINDINGS: Normal wall motion is seen in the left ventricle. The ejection fraction is calculated at 79%, which is normal.      07/02/2012   NORMAL EJECTION FRACTION, OF 79%.  Darnelle Maffucci, MD  07/02/2012 2:23 PM     Biopsy, Bone Marrow    06/29/2012  EXAMINATION: Fluoroscopic guided bone marrow aspirate and core biopsy.  INTERVENTIONALIST: Verlee Rossetti, MD.  HISTORY:  Patient is a 57 year old female with pancytopenia and neutropenic fever.  Anesthesia: Moderate sedation with Fentanyl and Versed was administered with appropriate physiologic monitoring using an independent trained observer for 30 minutes. 1% lidocaine local was administered as well.   TECHNIQUE: With the patient in the prone position the skin over the left iliac crest was prepped and draped in usual sterile fashion. Local anesthesia was applied to superficial and deep tissues with 2% lidocaine. Subsequently, under direct fluoroscopic guidance, a bone marrow needle was advanced to the posteromedial aspect of the superior left iliac crest. The outer cortex was traversed and bone marrow aspiration performed and the needle removed.    The bone marrow core needle was then advanced to a separate site on the iliac crest to obtain a core of bone marrow tissue. The samples were given to hematology for analysis. The patient tolerated procedure well without apparent competition.  INTERPRETATION: Fluoroscopy revealed successful access to the left iliac crest marrow cavity for bone marrow aspiration and for core biopsy.  Fluoroscopy Time: 0.3 min      06/29/2012   Technically successful fluoroscopic guided bone marrow aspirate and core biopsy for pancytopenia.  Larrie Kass, MD  06/29/2012 4:21 PM     Tunneled Cath Placement (permcath)    07/03/2012  Clinical history: Power line  Interventionalist: Dara Lords, MD  Informed consent obtained and all questions answered.  Anesthesia:  Moderate sedation with appropriate monitoring plus local. This consisted of Versed and fentanyl administration with continuous hemodynamic monitoring with a trained observer. Total anesthesia time with direct physician supervision 20 minutes.  Procedure: After standard prepping and draping with maximal sterile barrier technique and local anesthesia with 2% Xylocaine, percutaneous puncture of the right internal jugular vein is performed under ultrasound guidance with [place] image obtained for the medical record. The guidewire is advanced under fluoroscopic guidance. Dilator is advanced over the wire and left in place and flushed.  A double-lumen power line cuffed catheter is tunneled from the venotomy site to an exit site on the  right anterior chest wall after prior infiltration of the right anterior chest wall with Xylocaine plus epinephrine.  The dilator at the venotomy site is exchanged for a peel-away sheath. The double-lumen power line (equivalent to Groshong) catheter is advanced through the peel-away sheath. The peel-away sheath was removed. The catheter length is adjusted to position the catheter tip in the superior vena cava.  The catheter secured in place with 2-0 Prolene suture. The venotomy site was closed with  Dermabond.  Complications: None apparent.  Fluoroscopy time:0.3 minutes  Findings: The right internal jugular vein is patent by US imaging. An [place] image is obtained for the medical record. Catheter tip is in the superior vena cava.      07/03/2012    successful ultrasound and fluoroscopically guided placement of a right internal jugular dual-lumen cuffed power line catheter. The catheter may be used immediately.  Dara Lords, MD  07/03/2012 2:42 PM                       Larene Pickett, MD  [facility]    [phone]

## 2012-07-07 NOTE — Progress Notes (Signed)
Daily PROGRESS NOTE    Date Time: 07/07/2012 7:36 PM  Patient Name: [name], [name]  Patient Status: Inpatient  Hospital Day: 12    Assessment:   Acute Leukemia, NOS   Neutropenic fever  .Pancytopenia.   . Weight loss with a body mass index of 18 shows mild to moderate   Malnutrition  . Skin rash.  Diarrhea      Plan:   1. Chemo initiated day #2  2. Neutropenic precaution  3. Monitor CBC, lytes    Subjective:   Aches, fatigue, Insomnia, Skin rash on the anterior chest  10 point Review of Systems - Negative except for the Positives mentioned above      Medications:     Current Facility-Administered Medications   Medication Dose Route Frequency   . allopurinol  100 mg Oral TID   . cytarabine (CYTOSAR) continuous infusion  100 mg/m2 (Order-Specific) Intravenous Q24H   . [START ON 07/09/2012] dexamethasone  8 mg Oral Daily    Or   . [START ON 07/09/2012] dexamethasone  8 mg Intravenous Daily   . ondansetron  16 mg Oral Daily    And   . dexamethasone  12 mg Oral Daily   . IDArubicin  12 mg/m2 (Order-Specific) Intravenous Q24H   . lactobacillus/streoptococcus  1 capsule Oral Daily          sodium chloride 200 mL/hr Continuous PRN   acetaminophen 650 mg Q4H PRN   ALPRAZolam 0.25 mg QHS PRN   diphenhydrAMINE 50 mg PRN   diphenhydrAMINE-zinc acetate  TID PRN   EPINEPHrine 0.3 mg PRN   EPINEPHrine 0.1 mg PRN   hydrocortisone 100 mg PRN   LORazepam 1 mg Q4H PRN   Or     LORazepam 1 mg Q4H PRN   morphine 2 mg Q2H PRN   naloxone 0.2 mg PRN        Physical Exam:     Filed Vitals:    07/07/12 1500   BP: 104/56   Pulse: 78   Temp: 99.2 F (37.3 C)   Resp: 18   SpO2: 98%       Intake and Output Summary (Last 24 hours) at Date Time    Intake/Output Summary (Last 24 hours) at 07/07/12 1936  Last data filed at 07/07/12 0400   Gross per 24 hour   Intake   1778 ml   Output      0 ml   Net   1778 ml       Physical Exam   Constitutional: She is oriented to person, place, and time. She appears well-developed.   HENT:   Head:  Normocephalic and atraumatic.   Eyes: Pupils are equal, round, and reactive to light.   Neck: No JVD present. No tracheal deviation present. No thyromegaly present.   Cardiovascular: Normal rate and normal heart sounds.    Pulmonary/Chest: No respiratory distress. She has no wheezes. She has no rales.   Abdominal: She exhibits no distension. There is no tenderness. There is no rebound and no guarding.   Musculoskeletal: She exhibits no edema.   Neurological: She is alert and oriented to person, place, and time.   Erythematous Maculopapular rash on anterior chest is better          Labs:     Results     ** No Results found for the last 24 hours. **            Rads:     Xr Chest 2  Views    06/26/2012   CLINICAL INDICATION: pneumonia  COMPARISON: None available  FINDINGS:   2 views of the chest were obtained. The lungs are clear. The heart and vascularity are within normal limits.  There is no pleural thickening or effusion. The osseous structures are unremarkable.      06/26/2012   No active cardiopulmonary disease   Heron Nay, MD  06/26/2012 11:12 PM     Chest 2 Views    06/25/2012  CLINICAL INDICATION: Fever  COMPARISON: None.  INTERPRETATION: Frontal and lateral views of the chest obtained. Cardiomediastinal contour within normal limits for age. Clear lungs and sharp sulci.           06/25/2012   No acute cardiopulmonary process.  Filbert Schilder, MD  06/25/2012 10:43 PM         Signed by: Eric Form, MD  Pager: [phone]

## 2012-07-08 LAB — MAN DIFF ONLY
Band Neutrophils Absolute: 0 (ref 0.00–1.00)
Band Neutrophils: 0 %
Basophils Absolute Manual: 0 (ref 0.00–0.20)
Basophils Manual: 0 %
Eosinophils Absolute Manual: 0 (ref 0.00–0.70)
Eosinophils Manual: 0 %
Lymphocytes Absolute Manual: 0.29 — ABNORMAL LOW (ref 0.50–4.40)
Lymphocytes Manual: 23 %
Monocytes Absolute: 0.02 (ref 0.00–1.20)
Monocytes Manual: 2 %
Neutrophils Absolute Manual: 0.93 — ABNORMAL LOW (ref 1.80–8.10)
Nucleated RBC: 0 (ref 0–1)
Segmented Neutrophils: 75 %

## 2012-07-08 LAB — COMPREHENSIVE METABOLIC PANEL
ALT: 126 U/L — ABNORMAL HIGH (ref 0–55)
AST (SGOT): 44 U/L — ABNORMAL HIGH (ref 5–34)
Albumin/Globulin Ratio: 1 (ref 0.9–2.2)
Albumin: 2.8 g/dL — ABNORMAL LOW (ref 3.5–5.0)
Alkaline Phosphatase: 72 U/L (ref 40–150)
Anion Gap: 6 (ref 5.0–15.0)
BUN: 13 mg/dL (ref 7.0–19.0)
Bilirubin, Total: 0.4 mg/dL (ref 0.2–1.2)
CO2: 26 (ref 22–29)
Calcium: 9.2 mg/dL (ref 8.5–10.5)
Chloride: 109 — ABNORMAL HIGH (ref 98–107)
Creatinine: 0.6 mg/dL (ref 0.6–1.0)
Globulin: 2.7 g/dL (ref 2.0–3.6)
Glucose: 120 mg/dL — ABNORMAL HIGH (ref 70–100)
Potassium: 4.2 (ref 3.5–5.1)
Protein, Total: 5.5 g/dL — ABNORMAL LOW (ref 6.0–8.3)
Sodium: 141 (ref 136–145)

## 2012-07-08 LAB — CBC AND DIFFERENTIAL
Hematocrit: 25.8 % — ABNORMAL LOW (ref 37.0–47.0)
Hgb: 8.6 g/dL — ABNORMAL LOW (ref 12.0–16.0)
MCH: 32.7 pg — ABNORMAL HIGH (ref 28.0–32.0)
MCHC: 33.3 g/dL (ref 32.0–36.0)
MCV: 98.1 fL (ref 80.0–100.0)
MPV: 10.7 fL (ref 9.4–12.3)
Platelets: 157 (ref 140–400)
RBC: 2.63 — ABNORMAL LOW (ref 4.20–5.40)
RDW: 14 % (ref 12–15)
WBC: 1.24 — ABNORMAL LOW (ref 3.50–10.80)

## 2012-07-08 LAB — CELL MORPHOLOGY: Cell Morphology: ABNORMAL — AB

## 2012-07-08 LAB — GFR: EGFR: >60

## 2012-07-08 LAB — HEMOLYSIS INDEX: Hemolysis Index: 3 (ref 0–18)

## 2012-07-08 MED ORDER — SENNOSIDES-DOCUSATE SODIUM 8.6-50 MG PO TABS
1.0000 | ORAL_TABLET | Freq: Two times a day (BID) | ORAL | Status: DC | PRN
Start: 2012-07-08 — End: 2012-07-16
  Administered 2012-07-09 – 2012-07-10 (×2): 1 via ORAL
  Filled 2012-07-08 (×2): qty 1

## 2012-07-08 MED ORDER — LOPERAMIDE HCL 2 MG PO CAPS
2.00 mg | ORAL_CAPSULE | ORAL | Status: DC | PRN
Start: 2012-07-15 — End: 2012-07-28

## 2012-07-08 MED ORDER — ALUM & MAG HYDROXIDE-SIMETH 200-200-20 MG/5ML PO SUSP
30.0000 mL | ORAL | Status: DC | PRN
Start: 2012-07-08 — End: 2012-07-28
  Administered 2012-07-08 – 2012-07-27 (×14): 30 mL via ORAL
  Filled 2012-07-08 (×14): qty 30

## 2012-07-08 MED ORDER — LOPERAMIDE HCL 2 MG PO CAPS
4.0000 mg | ORAL_CAPSULE | Freq: Once | ORAL | Status: AC | PRN
Start: 2012-07-08 — End: 2012-07-15

## 2012-07-08 MED ORDER — ALTEPLASE 2 MG IJ SOLR
2.00 mg | Freq: Once | INTRAMUSCULAR | Status: DC | PRN
Start: 2012-07-08 — End: 2012-07-28
  Filled 2012-07-08: qty 2

## 2012-07-08 MED ORDER — SIMETHICONE 80 MG PO CHEW
80.0000 mg | CHEWABLE_TABLET | Freq: Four times a day (QID) | ORAL | Status: DC | PRN
Start: 2012-07-08 — End: 2012-07-28
  Administered 2012-07-22: 80 mg via ORAL
  Filled 2012-07-08 (×2): qty 1

## 2012-07-08 MED ORDER — ACETAMINOPHEN 325 MG PO TABS
650.0000 mg | ORAL_TABLET | ORAL | Status: DC | PRN
Start: 2012-07-08 — End: 2012-07-28
  Administered 2012-07-17 – 2012-07-26 (×9): 650 mg via ORAL
  Filled 2012-07-08 (×17): qty 2

## 2012-07-08 MED ORDER — LIDOCAINE-TRANSPARENT DRESSING 4 % EX KIT
PACK | CUTANEOUS | Status: DC | PRN
Start: 2012-07-08 — End: 2012-07-28
  Filled 2012-07-08: qty 1

## 2012-07-08 MED ORDER — MAGIC MOUTHWASH ORAL SUSPENSION
30.0000 mL | ORAL | Status: DC | PRN
Start: 2012-07-08 — End: 2012-07-28
  Filled 2012-07-08: qty 120

## 2012-07-08 MED ORDER — DISPOSABLE ENEMA 19-7 GM/118ML RE ENEM
1.0000 | ENEMA | Freq: Every day | RECTAL | Status: DC | PRN
Start: 2012-07-08 — End: 2012-07-28
  Filled 2012-07-08: qty 1

## 2012-07-08 MED ORDER — PANTOPRAZOLE SODIUM 40 MG PO TBEC
40.0000 mg | DELAYED_RELEASE_TABLET | Freq: Every morning | ORAL | Status: DC
Start: 2012-07-08 — End: 2012-07-28
  Administered 2012-07-08 – 2012-07-28 (×21): 40 mg via ORAL
  Filled 2012-07-08 (×21): qty 1

## 2012-07-08 NOTE — Progress Notes (Signed)
Daily PROGRESS NOTE    Date Time: 07/08/2012 8:38 PM  Patient Name: [name], [name]  Patient Status: Inpatient  Hospital Day: 13    Assessment:   Acute Leukemia, NOS   Neutropenic fever  .Pancytopenia.   . Weight loss with a body mass index of 18 shows mild to moderate   Malnutrition  . Skin rash.  Epigastric pain      Plan:   1. Chemo initiated day #3 of 1  2. Neutropenic precaution  3. Monitor CBC, lytes  4. Protonix    Subjective:   Epigastric pain  10 point Review of Systems - Negative except for the Positives mentioned above      Medications:     Current Facility-Administered Medications   Medication Dose Route Frequency   . [COMPLETED] allopurinol  100 mg Oral TID   . cytarabine (CYTOSAR) continuous infusion  100 mg/m2 (Order-Specific) Intravenous Q24H   . [START ON 07/09/2012] dexamethasone  8 mg Oral Daily    Or   . [START ON 07/09/2012] dexamethasone  8 mg Intravenous Daily   . [COMPLETED] ondansetron  16 mg Oral Daily    And   . [COMPLETED] dexamethasone  12 mg Oral Daily   . [COMPLETED] IDArubicin  12 mg/m2 (Order-Specific) Intravenous Q24H   . lactobacillus/streoptococcus  1 capsule Oral Daily   . pantoprazole  40 mg Oral QAM AC          sodium chloride 200 mL/hr Continuous PRN   acetaminophen 650 mg Q4H PRN   acetaminophen 650 mg Q4H PRN   ALPRAZolam 0.25 mg QHS PRN   alteplase 2 mg Once PRN   alum & mag hydroxide-simethicone 30 mL Q4H PRN   diphenhydrAMINE 50 mg PRN   diphenhydrAMINE-zinc acetate  TID PRN   EPINEPHrine 0.3 mg PRN   EPINEPHrine 0.1 mg PRN   hydrocortisone 100 mg PRN   lidocaine  PRN   loperamide 4 mg Once PRN   Followed by     [START ON 07/15/2012] loperamide 2 mg PRN   LORazepam 1 mg Q4H PRN   Or     LORazepam 1 mg Q4H PRN   Magic Mouthwash 30 mL Q4H PRN   morphine 2 mg Q2H PRN   naloxone 0.2 mg PRN   senna-docusate 1 tablet Q12H PRN   simethicone 80 mg Q6H PRN   sodium phosphate 1 enema QD PRN        Physical Exam:     Filed Vitals:    07/08/12 2007   BP: 131/62   Pulse: 62   Temp:  98.7 F (37.1 C)   Resp: 17   SpO2: 98%       Intake and Output Summary (Last 24 hours) at Date Time    Intake/Output Summary (Last 24 hours) at 07/08/12 2038  Last data filed at 07/08/12 0600   Gross per 24 hour   Intake    500 ml   Output      0 ml   Net    500 ml       Physical Exam   Constitutional: She is oriented to person, place, and time. She appears well-developed.   HENT:   Head: Normocephalic and atraumatic.   Eyes: Pupils are equal, round, and reactive to light.   Neck: No JVD present. No tracheal deviation present. No thyromegaly present.   Cardiovascular: Normal rate and normal heart sounds.    Pulmonary/Chest: No respiratory distress. She has no wheezes. She has no rales.  Abdominal: She exhibits no distension. There is no tenderness. There is no rebound and no guarding.   Musculoskeletal: She exhibits no edema.   Neurological: She is alert and oriented to person, place, and time.   Erythematous Maculopapular rash on anterior chest is better          Labs:     Results     Procedure Component Value Units Date/Time    Manual Differential [[id number]]  (Abnormal) Collected:07/08/12 0340     Segmented Neutrophils 75 % Updated:07/08/12 0717     Band Neutrophils 0 %      Lymphocytes Manual 23 %      Monocytes Manual 2 %      Eosinophils Manual 0 %      Basophils Manual 0 %      Nucleated RBC 0      Abs Seg Manual 0.93 (L)      Bands Absolute 0.00      Absolute Lymph Manual 0.29 (L)      Monocytes Absolute 0.02      Absolute Eos Manual 0.00      Absolute Baso Manual 0.00     Cell MorpHology [[id number]]  (Abnormal) Collected:07/08/12 0340     Cell Morphology: Abnormal (A) Updated:07/08/12 0717     Anisocytosis =1+ (A)      Poikilocytosis =1+ (A)      Ovalocytes =1+ (A)     CBC and differential [[id number]]  (Abnormal) Collected:07/08/12 0340    Specimen Information:Blood / Blood Updated:07/08/12 0717     WBC 1.24 (L)      RBC 2.63 (L)      Hgb 8.6 (L) g/dL      Hematocrit 10.9 (L) %      MCV 98.1 fL      MCH  32.7 (H) pg      MCHC 33.3 g/dL      RDW 14 %      Platelets 157      MPV 10.7 fL     Comprehensive metabolic panel [[id number]]  (Abnormal) Collected:07/08/12 0340    Specimen Information:Blood Updated:07/08/12 0613     Glucose 120 (H) mg/dL      BUN 02.5 mg/dL      Creatinine 0.6 mg/dL      Sodium 427      Potassium 4.2      Chloride 109 (H)      CO2 26      CALCIUM 9.2 mg/dL      Protein, Total 5.5 (L) g/dL      Albumin 2.8 (L) g/dL      AST (SGOT) 44 (H) U/L      ALT 126 (H) U/L      Alkaline Phosphatase 72 U/L      Bilirubin, Total 0.4 mg/dL      Globulin 2.7 g/dL      Albumin/Globulin Ratio 1.0      Anion Gap 6.0     HEMOLYZED INDEX [[id number]] Collected:07/08/12 0340     Hemolyzed Index 3 Updated:07/08/12 0613    GFR [[id number]] Collected:07/08/12 0340     EGFR >60.0 Updated:07/08/12 0613            Rads:     Xr Chest 2 Views    06/26/2012   CLINICAL INDICATION: pneumonia  COMPARISON: None available  FINDINGS:   2 views of the chest were obtained. The lungs are clear. The heart and vascularity are within normal limits.  There is no pleural thickening or effusion.  The osseous structures are unremarkable.      06/26/2012   No active cardiopulmonary disease   Heron Nay, MD  06/26/2012 11:12 PM     Chest 2 Views    06/25/2012  CLINICAL INDICATION: Fever  COMPARISON: None.  INTERPRETATION: Frontal and lateral views of the chest obtained. Cardiomediastinal contour within normal limits for age. Clear lungs and sharp sulci.           06/25/2012   No acute cardiopulmonary process.  Filbert Schilder, MD  06/25/2012 10:43 PM         Signed by: Eric Form, MD  Pager: [phone]

## 2012-07-08 NOTE — Consults (Signed)
I met Mrs Blizard today and brought by our resources about [facility]. Spoke briefly about our program and emphasized our nutrition class offered monthly. She did have a need of resources for wigs. I was able to give her multiple handouts for wigs service in our area. She thanked me for the information provided. I will continue to round. Thank you for the consult.

## 2012-07-08 NOTE — Progress Notes (Signed)
Infectious Diseases & Tropical Medicine  Progress Note    07/08/2012   [name] MVH:[id number],XLK:[id number] is a 57 y.o. female,       Assessment:     Acute leukemia  Rash   Blood cultures -NGTD  S/P  Groshong catheter placement  MUGA scan-ejection fraction 79%   Lyme antibody test negative  Parvovirus IgM antibody is negative (IgG antibodies-2.0)  Stool studies pending  Indigestion  Clinically stable  Afebrile  Neutropenia / anemia  Clinically stable    Plan:      Tolerating chemotherapy well    Start antibiotics if any signs of infection   Monitor liver function tests closely   Continue probiotics   Reviewed notes    ROS:     General:   Fever resolved, no chills, no rigor,awake and alert, feeling tired, sitting in her chair  HEENT: no neck pain, no throat pain  Endocrine:c/o fatigue   Respiratory: no cough,no shortness of breath, or wheezing   Cardiovascular: no chest pain   Gastrointestinal: no abdominal pain,no N/V,diarrhea improved, c/o indigestion  Genito-Urinary: no dysuria, trouble voiding, or hematuria   Musculoskeletal: no edema, myalgias  Neurological: c/o generalized weakness  Dermatological: maculopapular rash on the back improving and some on the anterior chest, no ulcer    Physical Examination:     Blood pressure 129/75, pulse 66, temperature 98.2 F (36.8 C), temperature source Oral, resp. rate 15, height 1.651 m (5\' 5" ), weight 45.36 kg (100 lb), SpO2 97.00%.     General Appearance: Comfortable, and in no acute distress. Awake,alert,feeling fatigue   HEENT: Pupils are equal, round, and reactive to light.    Lungs:  Decreased breath sounds   Heart: RRR   Chest: Symmetric chest wall expansion.minimal rash at anterior chest    Abdomen: soft ,non tender,no hepatosplenomegaly   Neurological: No focal deficit   Extremities: No edema    Laboratory And Diagnostic Studies:     Recent Labs   [facility] 07/08/12 0340 07/06/12 1402    WBC 1.24* 1.58*    HGB 8.6* 9.8*    HCT 25.8* 29.6*    PLT  157 200     Recent Labs   Basename 07/08/12 0340 07/06/12 1402    NA 141 141    K 4.2 4.2    CL 109* 104    CO2 26 28    BUN 13.0 17.0    CREAT 0.6 0.6    GLU 120* 99    CA 9.2 10.1     Recent Labs   Basename 07/08/12 0340    AST 44*    ALT 126*    ALKPHOS 72    PROT 5.5*    ALB 2.8*       Current Meds:      Scheduled Meds: PRN Meds:           [COMPLETED] allopurinol 100 mg Oral TID   cytarabine (CYTOSAR) continuous infusion 100 mg/m2 (Order-Specific) Intravenous Q24H   [START ON 07/09/2012] dexamethasone 8 mg Oral Daily   Or      [START ON 07/09/2012] dexamethasone 8 mg Intravenous Daily   ondansetron 16 mg Oral Daily   And      dexamethasone 12 mg Oral Daily   IDArubicin 12 mg/m2 (Order-Specific) Intravenous Q24H   lactobacillus/streoptococcus 1 capsule Oral Daily       Continuous Infusions:       . sodium chloride 100 mL/hr at 07/07/12 1713   . sodium chloride  sodium chloride 200 mL/hr Continuous PRN   acetaminophen 650 mg Q4H PRN   ALPRAZolam 0.25 mg QHS PRN   diphenhydrAMINE 50 mg PRN   diphenhydrAMINE-zinc acetate  TID PRN   EPINEPHrine 0.3 mg PRN   EPINEPHrine 0.1 mg PRN   hydrocortisone 100 mg PRN   LORazepam 1 mg Q4H PRN   Or     LORazepam 1 mg Q4H PRN   morphine 2 mg Q2H PRN   naloxone 0.2 mg PRN         [name], M.D.  07/08/2012  8:38 AM

## 2012-07-08 NOTE — Plan of Care (Signed)
Problem: Hemodynamic Status: Immune Suppressed-Hematologic  Goal: Stable vital signs and fluid balance  Outcome: Progressing  Diagnosis: Acute Myeloid Leukemia    Chemotherapy Administration:  18mg  Idarubicin and 150mg  cytarabine      IV Pre Hydration:  NS at 100 ml/hr    Pre-Medications:  zofran and decadron    Blood Return verified prior to administration: yes    IV/IM/IT Chemotherapy Administration:  see MAR    Chemo Vital Signs:  Patient Vitals for the past 12 hrs:    BP Temp Pulse Resp   07/08/12 0749 129/75 mmHg 98.2 F (36.8 C) 66  15         Hypersensitivity Reaction Noted:  no    Comments:  Chemotherapy verified by 2nd RN at change of shift and at administration.        Education: Patient/Family educated regarding the expected outcomes/side effects possible interactions.       Problem: Moderate/High Fall Risk Score >/=15  Goal: Patient will remain free of falls  Outcome: Progressing  Patient has remained free from falls during shift, call bell within reach and patient reminded to call if assistance is needed.

## 2012-07-08 NOTE — Progress Notes (Signed)
HEMATOLOGY ONCOLOGY PROGRESS NOTE    Date Time: 07/08/2012 9:09 AM  Patient Name: [name],[name]      IMPRESSION:     Patient Active Problem List   Diagnosis   . Chondromalacia of patella   . Neutropenic fever         PLAN: Symptom management orders as well as protonix were ordered.  It is likely that the mid epigastric discomfort is related to gastritis/mucosal change associated with decadron and chemotherapy.  No other changes in medications will be made.  Today is day 3 of 7+3.             HISTORY: Ms. Scripter is a 57 year old who was in her usual state of excellent health until approximately 5 days prior to admission when she began to experience arthralgias, myalgias, and some rhinorrhea. She was noted to have temperature   elevations to as high as 102 and 103 degrees. 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 Cytogenetics and FISH are pending.   Interval History: She started 7+3 07/06/12. Today is day 3. In general she tolerated chemotherapy well. ROS remarkable for fatigue and midepigastric discomfort.         PHYSICAL EXAM:     Filed Vitals:    07/08/12 0749   BP: 129/75   Pulse: 66   Temp: 98.2 F (36.8 C)   Resp: 15   SpO2: 97%       Intake and Output Summary (Last 24 hours) at Date Time    Intake/Output Summary (Last 24 hours) at 07/08/12 1610  Last data filed at 07/08/12 0600   Gross per 24 hour   Intake    500 ml   Output      0 ml   Net    500 ml       Appearance: in no acute distress  HEENT: pharynx clear  Lungs: clear to ausculation  Heart: regular rate and rhythm, normal heart sounds  Abdomen: flat and soft, active bowel sounds.  No obvious mid epigastric discomfort.  Extremities: no edema  Neuro: alert, appropriate  MEDS:   Scheduled Meds:  Current Facility-Administered Medications   Medication Dose Route Frequency   . [COMPLETED] allopurinol  100 mg Oral TID   . cytarabine (CYTOSAR) continuous infusion  100 mg/m2 (Order-Specific) Intravenous Q24H   . [START ON  07/09/2012] dexamethasone  8 mg Oral Daily    Or   . [START ON 07/09/2012] dexamethasone  8 mg Intravenous Daily   . ondansetron  16 mg Oral Daily    And   . dexamethasone  12 mg Oral Daily   . IDArubicin  12 mg/m2 (Order-Specific) Intravenous Q24H   . lactobacillus/streoptococcus  1 capsule Oral Daily   . pantoprazole  40 mg Oral QAM AC     Continuous Infusions:     . sodium chloride 100 mL/hr at 07/07/12 1713   . sodium chloride       PRN Meds:.sodium chloride, acetaminophen, acetaminophen, ALPRAZolam, alteplase, alum & mag hydroxide-simethicone, diphenhydrAMINE, diphenhydrAMINE-zinc acetate, EPINEPHrine, EPINEPHrine, hydrocortisone, lidocaine, [START ON 07/15/2012] loperamide, loperamide, LORazepam, LORazepam, Magic Mouthwash, morphine, naloxone, senna-docusate, simethicone, sodium phosphate    LABS:     Results     Procedure Component Value Units Date/Time    Manual Differential [[id number]]  (Abnormal) Collected:07/08/12 0340     Segmented Neutrophils 75 % Updated:07/08/12 0717     Band Neutrophils 0 %      Lymphocytes Manual 23 %  Monocytes Manual 2 %      Eosinophils Manual 0 %      Basophils Manual 0 %      Nucleated RBC 0      Abs Seg Manual 0.93 (L)      Bands Absolute 0.00      Absolute Lymph Manual 0.29 (L)      Monocytes Absolute 0.02      Absolute Eos Manual 0.00      Absolute Baso Manual 0.00     Cell MorpHology [[id number]]  (Abnormal) Collected:07/08/12 0340     Cell Morphology: Abnormal (A) Updated:07/08/12 0717     Anisocytosis =1+ (A)      Poikilocytosis =1+ (A)      Ovalocytes =1+ (A)     CBC and differential [[id number]]  (Abnormal) Collected:07/08/12 0340    Specimen Information:Blood / Blood Updated:07/08/12 0717     WBC 1.24 (L)      RBC 2.63 (L)      Hgb 8.6 (L) g/dL      Hematocrit 30.8 (L) %      MCV 98.1 fL      MCH 32.7 (H) pg      MCHC 33.3 g/dL      RDW 14 %      Platelets 157      MPV 10.7 fL     Comprehensive metabolic panel [[id number]]  (Abnormal) Collected:07/08/12 0340    Specimen  Information:Blood Updated:07/08/12 0613     Glucose 120 (H) mg/dL      BUN 95.2 mg/dL      Creatinine 0.6 mg/dL      Sodium 841      Potassium 4.2      Chloride 109 (H)      CO2 26      CALCIUM 9.2 mg/dL      Protein, Total 5.5 (L) g/dL      Albumin 2.8 (L) g/dL      AST (SGOT) 44 (H) U/L      ALT 126 (H) U/L      Alkaline Phosphatase 72 U/L      Bilirubin, Total 0.4 mg/dL      Globulin 2.7 g/dL      Albumin/Globulin Ratio 1.0      Anion Gap 6.0     HEMOLYZED INDEX [[id number]] Collected:07/08/12 0340     Hemolyzed Index 3 Updated:07/08/12 0613    GFR [[id number]] Collected:07/08/12 0340     EGFR >60.0 Updated:07/08/12 0613          IMAGING DATA:  Xr Chest 2 Views    06/26/2012   CLINICAL INDICATION: pneumonia  COMPARISON: None available  FINDINGS:   2 views of the chest were obtained. The lungs are clear. The heart and vascularity are within normal limits.  There is no pleural thickening or effusion. The osseous structures are unremarkable.      06/26/2012   No active cardiopulmonary disease   Heron Nay, MD  06/26/2012 11:12 PM     Chest 2 Views    06/25/2012  CLINICAL INDICATION: Fever  COMPARISON: None.  INTERPRETATION: Frontal and lateral views of the chest obtained. Cardiomediastinal contour within normal limits for age. Clear lungs and sharp sulci.           06/25/2012   No acute cardiopulmonary process.  Mitali  Bapna, MD  06/25/2012 10:43 PM     Ct Sinus Facial Bones Wo Contrast    06/28/2012  HISTORY: Sinusitis  COMPARISON: None.  TECHNIQUE: Axial  CT scans slices of the paranasal sinuses and facial bones with sagittal and coronal reformats  FINDINGS: No fracture or other bony abnormality. The sinuses are clear. The ostiomeatal complexes are patent. There is no mass or nasopharynx. There is a left concha bullosa. The nasal septum is midline.      06/28/2012   Normal  Lynnae Prude, MD  06/28/2012 2:42 PM     Ct Chest Wo Contrast    06/28/2012  HISTORY: Cough  COMPARISON: None  TECHNIQUE:  Helical CT scan of the  chest was obtained from the apices to the lung bases without intravenous contrast.      FINDINGS:  Lungs and central airways: Atelectasis or scarring in the lower lobes. Pleura: Trace effusions bilaterally. Aorta and Great Vessels: Within normal limits. Heart: No pericardial effusion. No coronary artery calcification. Mediastinum and hila: No mass or adenopathy Upper Abdomen: No significant abnormality. Bones: No significant abnormality. Chest wall and lower neck: Normal       06/28/2012   Atelectasis or scarring in the lower lobes. Trace bilateral pleural effusions.  Lynnae Prude, MD  06/28/2012 2:47 PM     Nm Cardiac Muga (at Rest)    07/02/2012  CLINICAL INDICATION: Prechemotherapy evaluation. Leukemia.  TECHNIQUE: A dynamic MUGA scan was obtained. Imaging obtained in the LAO, lateral, and anterior projections. The left ventricle region of interest was drawn, on the best separation view, in end-diastole and end-systole, and ejection fraction was calculated. 23.4 mCi of Tc77m labeled autologous RBCs, using the Ultratag technique was administered.  FINDINGS: Normal wall motion is seen in the left ventricle. The ejection fraction is calculated at 79%, which is normal.      07/02/2012   NORMAL EJECTION FRACTION, OF 79%.  Darnelle Maffucci, MD  07/02/2012 2:23 PM     Biopsy, Bone Marrow    06/29/2012  EXAMINATION: Fluoroscopic guided bone marrow aspirate and core biopsy.  INTERVENTIONALIST: Verlee Rossetti, MD.  HISTORY:  Patient is a 57 year old female with pancytopenia and neutropenic fever.  Anesthesia: Moderate sedation with Fentanyl and Versed was administered with appropriate physiologic monitoring using an independent trained observer for 30 minutes. 1% lidocaine local was administered as well.  TECHNIQUE: With the patient in the prone position the skin over the left iliac crest was prepped and draped in usual sterile fashion. Local anesthesia was applied to superficial and deep tissues with 2% lidocaine. Subsequently, under  direct fluoroscopic guidance, a bone marrow needle was advanced to the posteromedial aspect of the superior left iliac crest. The outer cortex was traversed and bone marrow aspiration performed and the needle removed.    The bone marrow core needle was then advanced to a separate site on the iliac crest to obtain a core of bone marrow tissue. The samples were given to hematology for analysis. The patient tolerated procedure well without apparent competition.  INTERPRETATION: Fluoroscopy revealed successful access to the left iliac crest marrow cavity for bone marrow aspiration and for core biopsy.  Fluoroscopy Time: 0.3 min      06/29/2012   Technically successful fluoroscopic guided bone marrow aspirate and core biopsy for pancytopenia.  Larrie Kass, MD  06/29/2012 4:21 PM     Tunneled Cath Placement (permcath)    07/03/2012   Clinical history: Power line  Interventionalist: Dara Lords, MD  Informed consent obtained and all questions answered.  Anesthesia:  Moderate sedation with appropriate monitoring plus local. This consisted of Versed and fentanyl administration with continuous hemodynamic  monitoring with a trained observer. Total anesthesia time with direct physician supervision 20 minutes.  Procedure: After standard prepping and draping with maximal sterile barrier technique and local anesthesia with 2% Xylocaine, percutaneous puncture of the right internal jugular vein is performed under ultrasound guidance with [place] image obtained for the medical record. The guidewire is advanced under fluoroscopic guidance. Dilator is advanced over the wire and left in place and flushed.  A double-lumen power line cuffed catheter is tunneled from the venotomy site to an exit site on the right anterior chest wall after prior infiltration of the right anterior chest wall with Xylocaine plus epinephrine.  The dilator at the venotomy site is exchanged for a peel-away sheath. The double-lumen power line (equivalent to  Groshong) catheter is advanced through the peel-away sheath. The peel-away sheath was removed. The catheter length is adjusted to position the catheter tip in the superior vena cava.  The catheter secured in place with 2-0 Prolene suture. The venotomy site was closed with  Dermabond.  Complications: None apparent.  Fluoroscopy time:0.3 minutes  Findings: The right internal jugular vein is patent by US imaging. An [place] image is obtained for the medical record. Catheter tip is in the superior vena cava.      07/03/2012    successful ultrasound and fluoroscopically guided placement of a right internal jugular dual-lumen cuffed power line catheter. The catheter may be used immediately.  Dara Lords, MD  07/03/2012 2:42 PM                       Larene Pickett, MD  [facility]    [phone]

## 2012-07-08 NOTE — Plan of Care (Signed)
Problem: Hemodynamic Status: Immune Suppressed-Hematologic  Goal: Stable vital signs and fluid balance  Outcome: Progressing  On going second bag of Cytarabine infusing through patent right chest groshong, so far no any other signs or symptoms noted, denies any pain or  Shortness of breath. VSS. Tolerated chemo well so far. Slept at long intervals.     Comments:   Plan- continue to assess comfort level. Monitor VSS  while on chemo infusion. Maintain on neutropenic isolation. Ffup labs result drawn today. Hourly rounds. Will continue to monitor. Safety.

## 2012-07-09 LAB — HEMOLYSIS INDEX
Hemolysis Index: 6 (ref 0–18)
Hemolysis Index: 7 (ref 0–18)

## 2012-07-09 LAB — CELL MORPHOLOGY: Cell Morphology: ABNORMAL — AB

## 2012-07-09 LAB — CBC AND DIFFERENTIAL
Hematocrit: 25.5 % — ABNORMAL LOW (ref 37.0–47.0)
Hgb: 8.4 g/dL — ABNORMAL LOW (ref 12.0–16.0)
MCH: 32.3 pg — ABNORMAL HIGH (ref 28.0–32.0)
MCHC: 32.9 g/dL (ref 32.0–36.0)
MCV: 98.1 fL (ref 80.0–100.0)
MPV: 10.1 fL (ref 9.4–12.3)
Platelets: 137 — ABNORMAL LOW (ref 140–400)
RBC: 2.6 — ABNORMAL LOW (ref 4.20–5.40)
RDW: 13 % (ref 12–15)
WBC: 0.94 — ABNORMAL LOW (ref 3.50–10.80)

## 2012-07-09 LAB — COMPREHENSIVE METABOLIC PANEL
ALT: 330 U/L — ABNORMAL HIGH (ref 0–55)
AST (SGOT): 208 U/L — ABNORMAL HIGH (ref 5–34)
Albumin/Globulin Ratio: 1.1 (ref 0.9–2.2)
Albumin: 2.7 g/dL — ABNORMAL LOW (ref 3.5–5.0)
Alkaline Phosphatase: 68 U/L (ref 40–150)
Anion Gap: 5 (ref 5.0–15.0)
BUN: 15 mg/dL (ref 7.0–19.0)
Bilirubin, Total: 0.5 mg/dL (ref 0.2–1.2)
CO2: 26 (ref 22–29)
Calcium: 8.8 mg/dL (ref 8.5–10.5)
Chloride: 110 — ABNORMAL HIGH (ref 98–107)
Creatinine: 0.6 mg/dL (ref 0.6–1.0)
Globulin: 2.5 g/dL (ref 2.0–3.6)
Glucose: 105 mg/dL — ABNORMAL HIGH (ref 70–100)
Potassium: 4.1 (ref 3.5–5.1)
Protein, Total: 5.2 g/dL — ABNORMAL LOW (ref 6.0–8.3)
Sodium: 141 (ref 136–145)

## 2012-07-09 LAB — MAN DIFF ONLY
Band Neutrophils Absolute: 0 (ref 0.00–1.00)
Band Neutrophils: 0 %
Basophils Absolute Manual: 0 (ref 0.00–0.20)
Basophils Manual: 0 %
Eosinophils Absolute Manual: 0 (ref 0.00–0.70)
Eosinophils Manual: 0 %
Lymphocytes Absolute Manual: 0.14 — ABNORMAL LOW (ref 0.50–4.40)
Lymphocytes Manual: 15 %
Monocytes Absolute: 0.03 (ref 0.00–1.20)
Monocytes Manual: 3 %
Neutrophils Absolute Manual: 0.77 — ABNORMAL LOW (ref 1.80–8.10)
Nucleated RBC: 0 (ref 0–1)
Segmented Neutrophils: 82 %

## 2012-07-09 LAB — URIC ACID: Uric acid: 1.7 mg/dL — ABNORMAL LOW (ref 2.6–6.0)

## 2012-07-09 LAB — GFR: EGFR: >60

## 2012-07-09 MED ORDER — POLYETHYLENE GLYCOL 3350 17 G PO PACK
17.00 g | PACK | Freq: Every day | ORAL | Status: DC
Start: 2012-07-09 — End: 2012-07-28
  Administered 2012-07-09 – 2012-07-28 (×17): 17 g via ORAL
  Filled 2012-07-09 (×20): qty 1

## 2012-07-09 MED ORDER — ENOXAPARIN SODIUM 40 MG/0.4ML SC SOLN
40.00 mg | Freq: Every day | SUBCUTANEOUS | Status: DC
Start: 2012-07-09 — End: 2012-07-10
  Administered 2012-07-09: 40 mg via SUBCUTANEOUS
  Filled 2012-07-09: qty 0.4

## 2012-07-09 NOTE — Plan of Care (Signed)
Problem: Hemodynamic Status: Immune Suppressed-Hematologic  Goal: Stable vital signs and fluid balance  Outcome: Progressing  Alert & oriented x 3 while awake. No longer c/o chest pressure/pain. EKG done at bedside. Labs drawn. IVF NS rate lowered to 50cc/hr due to  new c/o BLE "heaviness" & trace nonpitting edema noted in both ankles. Ambulating independently in room. Requested SCD not be put on   at this time due to independent ambulation to & from bathroom & to chair in room.  Cytarabine IV infusing maintained at 42cc/hr without incident.  Neutropenic precautions maintained. Call bell at hand. Hourly & more frequent rounds as needed.

## 2012-07-09 NOTE — Consults (Addendum)
Nutritional Support Services  Nutrition Follow-up    [name] 57 y.o. female   MRN: [id number]    Summary of Nutrition Recommendations:  1) Continue current diet order    2)  Continue Ensure    3) Stool softener/laxative per MD for constipation interfering with po intake     4) Obtain current weight, obtain weekly    Nutrition Dx: Moderate malnutrition related to acute/chronic illness as evidenced by 87% UBW of 115 lb, 15 lb weight loss noted compared to usual body weight (13%) considered a significant amount, visible low muscle tone  -----------------------------------------------------------------------------------------------------------------                                                           ASSESSMENT DATA     Nutrition: F/U from previous RD assessment (6/23) for malnutrition and increased nutrient needs.  Pt with mid epigastric discomfort likely r/t gastritis/mucosal change associated with decadron and chemotherapy.   Pt reports constipation which is interfering with the amount of po intake.    Events of Current Admission: Pt diagnosed with leukemia during this admission, (6/20) tunneled central line placed, chemotherapy started (6/23)  Medical Hx:  has a past medical history of Asthma without status asthmaticus.  Social Hx: alcohol socially      Current Diet Order  Diet neutropenic - low bacteria  Intake: Pt reports slight decrease in appetite, mostly due to constipation.  Pt drinking at least 1 can of Ensure/day, aiming for 2 per day.      ANTHROPOMETRIC  Anthropometrics  Height: 165.1 cm (5\' 5" )  Weight: 45.36 kg (100 lb)  Weight Change: -9.91   IBW/kg (Calculated) Female: 61.83 kg  IBW/kg (Calculated) Female: 56.81 kg  BMI (calculated): 18.5     UBW= 115 lb.  Pt is currently 87% UBW of 115 lb    No new weight    Physical Appearance:   Skin: none noted  GI function: C.Diff negative, +constipation, pt reports daily BM- but small amount.  Miralax added    ESTIMATED NEEDS  Estimated Energy  Needs  Total Energy Estimated Needs: 5409-8119 kcal/day  Method for Estimating Needs: 30-35 kcal/kg    Estimated Protein Needs  Total Protein Estimated Needs: 50-65 gm pro/day  Method for Estimating Needs: 1-1.3 gm pro/kg    Fluid Needs  Total Fluid Estimated Needs: ~1500 ml/day or per MD  Method for Estimating Needs: ~30 ml/kg    Pertinent Medications: decadron, risaquad, miralax  IVF:         . sodium chloride 100 mL/hr at 07/09/12 1248   . sodium chloride         Pertinent labs:    Lab 07/09/12 0602 07/08/12 0340 07/06/12 1402 07/03/12 0434   NA 141 141 141 --   K 4.1 4.2 4.2 --   CL 110* 109* 104 --   CO2 26 26 28  --   BUN 15.0 13.0 17.0 --   CREAT 0.6 0.6 0.6 --   GLU 105* 120* 99 --   CA 8.8 9.2 10.1 --   MG -- -- -- --   PHOS -- -- -- --   EGFR >60.0 >60.0 >60.0 --   WBC -- 1.24* 1.58* 1.69*   HCT -- 25.8* 29.6* 29.9*   HGB -- 8.6* 9.8* 10.0*  TRIG -- -- -- --   AMY -- -- -- --   LIP -- -- -- --     Blood sugars: 99-120    Learning Needs: Cancer booklet given to pt during previous RD visit (6/20)                                                    NUTRITION DIAGNOSIS     Increased nutrient needs related to medical condition- Leukemia as evidenced by increased estimated needs: Ongoing     Moderate malnutrition related to acute/chronic illness as evidenced by 87% UBW of 115 lb, 15 lb weight loss noted compared to usual body weight (13%) considered a significant amount, visible low muscle tone: ongoing                                                           INTERVENTION     Goal: Obtain adequate nutrition and halt weight loss    1) Continue current diet order    2) Continue Ensure    3) Stool softener/laxative per MD for constipation    4) Obtain new weight- RD discussed with RN when pt ready to get back into bed.  Obtain weight wekly                                                     MONITOR/EVALUATION     Monitor po intake, nutritional side effects of chemotherapy, GI fxn, labs, weight    RD to follow pt at  moderate nutrition risk      Donovyn Guidice L. Lenon Ahmadi, RD, LD  Spectralink x [phone]  Nutrition Office x [phone]

## 2012-07-09 NOTE — Progress Notes (Signed)
Infectious Diseases & Tropical Medicine  Progress Note    07/09/2012   [name] ZOX:[id number]:[id number] is a 57 y.o. female,       Assessment:     Acute leukemia  Rash   Blood cultures -NGTD  S/P  Groshong catheter placement  MUGA scan-ejection fraction 79%   Lyme antibody test negative  Parvovirus IgM antibody is negative (IgG antibodies-2.0)  Stool studies pending  Indigestion improved  Clinically stable  Remains afebrile  Neutropenia / anemia  Clinically stable    Plan:      Tolerating chemotherapy well    Continue monitoring without antibiotics   Monitor liver function tests closely   Continue probiotics   Reviewed notes    ROS:     General:   Fever resolved, no chills, no rigor,awake and alert, feeling tired, sitting in her chair, in good spirits  HEENT: no neck pain, no throat pain  Endocrine:c/o fatigue   Respiratory: no cough,no shortness of breath, or wheezing   Cardiovascular: no chest pain   Gastrointestinal: no abdominal pain,no N/V,constipated, c/o abdominal bloating  Genito-Urinary: no dysuria, trouble voiding, or hematuria   Musculoskeletal: no edema, myalgias, leg swellling  Neurological: c/o generalized weakness  Dermatological: maculopapular rash on the back improved, no ulcer    Physical Examination:     Blood pressure 127/71, pulse 65, temperature 96.7 F (35.9 C), temperature source Axillary, resp. rate 16, height 1.651 m (5\' 5" ), weight 45.36 kg (100 lb), SpO2 99.00%.     General Appearance: Comfortable, and in no acute distress. Awake,alert,feeling fatigue but in good spirits   HEENT: Pupils are equal, round, and reactive to light.    Lungs:  Scattered rhonchi   Heart: RRR   Chest: Symmetric chest wall expansion.minimal rash at anterior chest    Abdomen: soft ,non tender,no hepatosplenomegaly   Neurological: No focal deficit   Extremities: No edema    Laboratory And Diagnostic Studies:     Recent Labs   [facility] 07/08/12 0340 07/06/12 1402    WBC 1.24* 1.58*    HGB 8.6*  9.8*    HCT 25.8* 29.6*    PLT 157 200     Recent Labs   [facility] 07/09/12 0602 07/08/12 0340    NA 141 141    K 4.1 4.2    CL 110* 109*    CO2 26 26    BUN 15.0 13.0    CREAT 0.6 0.6    GLU 105* 120*    CA 8.8 9.2     Recent Labs   Basename 07/09/12 0602 07/08/12 0340    AST 208* 44*    ALT 330* 126*    ALKPHOS 68 72    PROT 5.2* 5.5*    ALB 2.7* 2.8*       Current Meds:      Scheduled Meds: PRN Meds:           cytarabine (CYTOSAR) continuous infusion 100 mg/m2 (Order-Specific) Intravenous Q24H   dexamethasone 8 mg Oral Daily   Or      dexamethasone 8 mg Intravenous Daily   [COMPLETED] ondansetron 16 mg Oral Daily   And      [COMPLETED] dexamethasone 12 mg Oral Daily   [COMPLETED] IDArubicin 12 mg/m2 (Order-Specific) Intravenous Q24H   lactobacillus/streoptococcus 1 capsule Oral Daily   pantoprazole 40 mg Oral QAM AC       Continuous Infusions:       . sodium chloride 100 mL/hr at 07/08/12 1356   .  sodium chloride           sodium chloride 200 mL/hr Continuous PRN   acetaminophen 650 mg Q4H PRN   acetaminophen 650 mg Q4H PRN   ALPRAZolam 0.25 mg QHS PRN   alteplase 2 mg Once PRN   alum & mag hydroxide-simethicone 30 mL Q4H PRN   diphenhydrAMINE 50 mg PRN   diphenhydrAMINE-zinc acetate  TID PRN   EPINEPHrine 0.3 mg PRN   EPINEPHrine 0.1 mg PRN   hydrocortisone 100 mg PRN   lidocaine  PRN   loperamide 4 mg Once PRN   Followed by     [START ON 07/15/2012] loperamide 2 mg PRN   LORazepam 1 mg Q4H PRN   Or     LORazepam 1 mg Q4H PRN   Magic Mouthwash 30 mL Q4H PRN   morphine 2 mg Q2H PRN   naloxone 0.2 mg PRN   senna-docusate 1 tablet Q12H PRN   simethicone 80 mg Q6H PRN   sodium phosphate 1 enema QD PRN         [name] A. Janalyn Rouse, M.D.  07/09/2012  9:19 AM

## 2012-07-09 NOTE — Plan of Care (Signed)
Problem: Hemodynamic Status: Immune Suppressed-Hematologic  Goal: Stable vital signs and fluid balance  Outcome: Progressing  On going third bag of chemo  Cytarabin infusing, handoff check with day RN upon initial rounds. VSS . At 2100 c/o indigestion Maalox given with good result noted after. Slept at long intervals. Voided freely. For labs today. Maintain on Neutropenic isolation. Will continue to monitor. Safety.

## 2012-07-09 NOTE — Progress Notes (Signed)
Daily PROGRESS NOTE    Date Time: 07/09/2012 12:42 PM  Patient Name: [name], [name]  Patient Status: Inpatient  Hospital Day: 14    Assessment:   Acute Leukemia, NOS   Neutropenic fever  .Pancytopenia.   . Weight loss with a body mass index of 18 shows mild to moderate   Malnutrition  . Skin rash.  Epigastric pain      Plan:   1. Chemo initiated day #4 of 1  2. Neutropenic precaution  3. Monitor CBC, lytes  4. Protonix    Subjective:   Epigastric pain  10 point Review of Systems - Negative except for the Positives mentioned above      Medications:     Current Facility-Administered Medications   Medication Dose Route Frequency   . cytarabine (CYTOSAR) continuous infusion  100 mg/m2 (Order-Specific) Intravenous Q24H   . dexamethasone  8 mg Oral Daily    Or   . dexamethasone  8 mg Intravenous Daily   . [COMPLETED] ondansetron  16 mg Oral Daily    And   . [COMPLETED] dexamethasone  12 mg Oral Daily   . [COMPLETED] IDArubicin  12 mg/m2 (Order-Specific) Intravenous Q24H   . lactobacillus/streoptococcus  1 capsule Oral Daily   . pantoprazole  40 mg Oral QAM AC   . polyethylene glycol  17 g Oral Daily          sodium chloride 200 mL/hr Continuous PRN   acetaminophen 650 mg Q4H PRN   acetaminophen 650 mg Q4H PRN   ALPRAZolam 0.25 mg QHS PRN   alteplase 2 mg Once PRN   alum & mag hydroxide-simethicone 30 mL Q4H PRN   diphenhydrAMINE 50 mg PRN   diphenhydrAMINE-zinc acetate  TID PRN   EPINEPHrine 0.3 mg PRN   EPINEPHrine 0.1 mg PRN   hydrocortisone 100 mg PRN   lidocaine  PRN   loperamide 4 mg Once PRN   Followed by     [START ON 07/15/2012] loperamide 2 mg PRN   LORazepam 1 mg Q4H PRN   Or     LORazepam 1 mg Q4H PRN   Magic Mouthwash 30 mL Q4H PRN   morphine 2 mg Q2H PRN   naloxone 0.2 mg PRN   senna-docusate 1 tablet Q12H PRN   simethicone 80 mg Q6H PRN   sodium phosphate 1 enema QD PRN        Physical Exam:     Filed Vitals:    07/09/12 0700   BP: 127/71   Pulse: 65   Temp: 96.7 F (35.9 C)   Resp: 16   SpO2: 99%        Intake and Output Summary (Last 24 hours) at Date Time    Intake/Output Summary (Last 24 hours) at 07/09/12 1242  Last data filed at 07/09/12 0700   Gross per 24 hour   Intake    600 ml   Output      0 ml   Net    600 ml       Physical Exam   Constitutional: She is oriented to person, place, and time. She appears well-developed.   HENT:   Head: Normocephalic and atraumatic.   Eyes: Pupils are equal, round, and reactive to light.   Neck: No JVD present. No tracheal deviation present. No thyromegaly present.   Cardiovascular: Normal rate and normal heart sounds.    Pulmonary/Chest: No respiratory distress. She has no wheezes. She has no rales.   Abdominal: She exhibits no  distension. There is no tenderness. There is no rebound and no guarding.   Musculoskeletal: She exhibits no edema.   Neurological: She is alert and oriented to person, place, and time.   Erythematous Maculopapular rash on anterior chest is better          Labs:     Results     Procedure Component Value Units Date/Time    Comprehensive metabolic panel [[id number]]  (Abnormal) Collected:07/09/12 0602    Specimen Information:Blood Updated:07/09/12 0649     Glucose 105 (H) mg/dL      BUN 11.9 mg/dL      Creatinine 0.6 mg/dL      Sodium 147      Potassium 4.1      Chloride 110 (H)      CO2 26      CALCIUM 8.8 mg/dL      Protein, Total 5.2 (L) g/dL      Albumin 2.7 (L) g/dL      AST (SGOT) 829 (H) U/L      ALT 330 (H) U/L      Alkaline Phosphatase 68 U/L      Bilirubin, Total 0.5 mg/dL      Globulin 2.5 g/dL      Albumin/Globulin Ratio 1.1      Anion Gap 5.0     HEMOLYZED INDEX [[id number]] Collected:07/09/12 0602     Hemolyzed Index 6 Updated:07/09/12 0649    GFR [[id number]] Collected:07/09/12 0602     EGFR >60.0 Updated:07/09/12 0649            Rads:     Xr Chest 2 Views    06/26/2012   CLINICAL INDICATION: pneumonia  COMPARISON: None available  FINDINGS:   2 views of the chest were obtained. The lungs are clear. The heart and vascularity are within  normal limits.  There is no pleural thickening or effusion. The osseous structures are unremarkable.      06/26/2012   No active cardiopulmonary disease   Heron Nay, MD  06/26/2012 11:12 PM     Chest 2 Views    06/25/2012  CLINICAL INDICATION: Fever  COMPARISON: None.  INTERPRETATION: Frontal and lateral views of the chest obtained. Cardiomediastinal contour within normal limits for age. Clear lungs and sharp sulci.           06/25/2012   No acute cardiopulmonary process.  Filbert Schilder, MD  06/25/2012 10:43 PM         Signed by: Eric Form, MD  Pager: [phone]

## 2012-07-09 NOTE — Progress Notes (Signed)
HEMATOLOGY ONCOLOGY PROGRESS NOTE    Date Time: 07/09/2012 8:53 AM  Patient Name: Kazlauskas,Chenell W      IMPRESSION:     Patient Active Problem List   Diagnosis   . Chondromalacia of patella   . Neutropenic fever         PLAN: No other changes in medications will be made. Today is day 4 of 7+3. CBC from today is pending.               HISTORY: Ms. Stammer is a 57 year old who was in her usual state of excellent health until approximately 5 days prior to admission when she began to experience arthralgias, myalgias, and some rhinorrhea. She was noted to have temperature   elevations to as high as 102 and 103 degrees. 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 Cytogenetics and FISH are pending.   Interval History: She started 7+3 07/06/12. Today is day 4. In general she tolerated chemotherapy well. ROS remarkable for fatigue and midepigastric discomfort which is improved from yesterday but persists.         PHYSICAL EXAM:     Filed Vitals:    07/09/12 0700   BP: 127/71   Pulse: 65   Temp: 96.7 F (35.9 C)   Resp: 16   SpO2: 99%       Intake and Output Summary (Last 24 hours) at Date Time    Intake/Output Summary (Last 24 hours) at 07/09/12 0853  Last data filed at 07/09/12 0700   Gross per 24 hour   Intake    600 ml   Output      0 ml   Net    600 ml       Appearance: in no acute distress  HEENT: pharynx clear  Lungs: clear to ausculation  Heart: regular rate and rhythm, normal heart sounds  Abdomen: flat and soft, active bowel sounds  Extremities: no edema  Neuro: alert, appropriate  MEDS:   Scheduled Meds:  Current Facility-Administered Medications   Medication Dose Route Frequency   . cytarabine (CYTOSAR) continuous infusion  100 mg/m2 (Order-Specific) Intravenous Q24H   . dexamethasone  8 mg Oral Daily    Or   . dexamethasone  8 mg Intravenous Daily   . [COMPLETED] ondansetron  16 mg Oral Daily    And   . [COMPLETED] dexamethasone  12 mg Oral Daily   . [COMPLETED] IDArubicin  12 mg/m2  (Order-Specific) Intravenous Q24H   . lactobacillus/streoptococcus  1 capsule Oral Daily   . pantoprazole  40 mg Oral QAM AC     Continuous Infusions:     . sodium chloride 100 mL/hr at 07/08/12 1356   . sodium chloride       PRN Meds:.sodium chloride, acetaminophen, acetaminophen, ALPRAZolam, alteplase, alum & mag hydroxide-simethicone, diphenhydrAMINE, diphenhydrAMINE-zinc acetate, EPINEPHrine, EPINEPHrine, hydrocortisone, lidocaine, [START ON 07/15/2012] loperamide, loperamide, LORazepam, LORazepam, Magic Mouthwash, morphine, naloxone, senna-docusate, simethicone, sodium phosphate    LABS:     Results     Procedure Component Value Units Date/Time    Comprehensive metabolic panel [[id number]]  (Abnormal) Collected:07/09/12 0602    Specimen Information:Blood Updated:07/09/12 0649     Glucose 105 (H) mg/dL      BUN 25.9 mg/dL      Creatinine 0.6 mg/dL      Sodium 563      Potassium 4.1      Chloride 110 (H)      CO2 26  CALCIUM 8.8 mg/dL      Protein, Total 5.2 (L) g/dL      Albumin 2.7 (L) g/dL      AST (SGOT) 161 (H) U/L      ALT 330 (H) U/L      Alkaline Phosphatase 68 U/L      Bilirubin, Total 0.5 mg/dL      Globulin 2.5 g/dL      Albumin/Globulin Ratio 1.1      Anion Gap 5.0     HEMOLYZED INDEX [[id number]] Collected:07/09/12 0602     Hemolyzed Index 6 Updated:07/09/12 0649    GFR [[id number]] Collected:07/09/12 0602     EGFR >60.0 Updated:07/09/12 0649          IMAGING DATA:  Xr Chest 2 Views    06/26/2012   CLINICAL INDICATION: pneumonia  COMPARISON: None available  FINDINGS:   2 views of the chest were obtained. The lungs are clear. The heart and vascularity are within normal limits.  There is no pleural thickening or effusion. The osseous structures are unremarkable.      06/26/2012   No active cardiopulmonary disease   Heron Nay, MD  06/26/2012 11:12 PM     Chest 2 Views    06/25/2012  CLINICAL INDICATION: Fever  COMPARISON: None.  INTERPRETATION: Frontal and lateral views of the chest obtained.  Cardiomediastinal contour within normal limits for age. Clear lungs and sharp sulci.           06/25/2012   No acute cardiopulmonary process.  Mitali  Bapna, MD  06/25/2012 10:43 PM     Ct Sinus Facial Bones Wo Contrast    06/28/2012  HISTORY: Sinusitis  COMPARISON: None.  TECHNIQUE: Axial CT scans slices of the paranasal sinuses and facial bones with sagittal and coronal reformats  FINDINGS: No fracture or other bony abnormality. The sinuses are clear. The ostiomeatal complexes are patent. There is no mass or nasopharynx. There is a left concha bullosa. The nasal septum is midline.      06/28/2012   Normal  Lynnae Prude, MD  06/28/2012 2:42 PM     Ct Chest Wo Contrast    06/28/2012  HISTORY: Cough  COMPARISON: None  TECHNIQUE:  Helical CT scan of the chest was obtained from the apices to the lung bases without intravenous contrast.      FINDINGS:  Lungs and central airways: Atelectasis or scarring in the lower lobes. Pleura: Trace effusions bilaterally. Aorta and Great Vessels: Within normal limits. Heart: No pericardial effusion. No coronary artery calcification. Mediastinum and hila: No mass or adenopathy Upper Abdomen: No significant abnormality. Bones: No significant abnormality. Chest wall and lower neck: Normal       06/28/2012   Atelectasis or scarring in the lower lobes. Trace bilateral pleural effusions.  Lynnae Prude, MD  06/28/2012 2:47 PM     Nm Cardiac Muga (at Rest)    07/02/2012  CLINICAL INDICATION: Prechemotherapy evaluation. Leukemia.  TECHNIQUE: A dynamic MUGA scan was obtained. Imaging obtained in the LAO, lateral, and anterior projections. The left ventricle region of interest was drawn, on the best separation view, in end-diastole and end-systole, and ejection fraction was calculated. 23.4 mCi of Tc29m labeled autologous RBCs, using the Ultratag technique was administered.  FINDINGS: Normal wall motion is seen in the left ventricle. The ejection fraction is calculated at 79%, which is normal.       07/02/2012   NORMAL EJECTION FRACTION, OF 79%.  Darnelle Maffucci, MD  07/02/2012  2:23 PM     Biopsy, Bone Marrow    06/29/2012  EXAMINATION: Fluoroscopic guided bone marrow aspirate and core biopsy.  INTERVENTIONALIST: Verlee Rossetti, MD.  HISTORY:  Patient is a 57 year old female with pancytopenia and neutropenic fever.  Anesthesia: Moderate sedation with Fentanyl and Versed was administered with appropriate physiologic monitoring using an independent trained observer for 30 minutes. 1% lidocaine local was administered as well.  TECHNIQUE: With the patient in the prone position the skin over the left iliac crest was prepped and draped in usual sterile fashion. Local anesthesia was applied to superficial and deep tissues with 2% lidocaine. Subsequently, under direct fluoroscopic guidance, a bone marrow needle was advanced to the posteromedial aspect of the superior left iliac crest. The outer cortex was traversed and bone marrow aspiration performed and the needle removed.    The bone marrow core needle was then advanced to a separate site on the iliac crest to obtain a core of bone marrow tissue. The samples were given to hematology for analysis. The patient tolerated procedure well without apparent competition.  INTERPRETATION: Fluoroscopy revealed successful access to the left iliac crest marrow cavity for bone marrow aspiration and for core biopsy.  Fluoroscopy Time: 0.3 min      06/29/2012   Technically successful fluoroscopic guided bone marrow aspirate and core biopsy for pancytopenia.  Larrie Kass, MD  06/29/2012 4:21 PM     Tunneled Cath Placement (permcath)    07/03/2012   Clinical history: Power line  Interventionalist: Dara Lords, MD  Informed consent obtained and all questions answered.  Anesthesia:  Moderate sedation with appropriate monitoring plus local. This consisted of Versed and fentanyl administration with continuous hemodynamic monitoring with a trained observer. Total anesthesia time with  direct physician supervision 20 minutes.  Procedure: After standard prepping and draping with maximal sterile barrier technique and local anesthesia with 2% Xylocaine, percutaneous puncture of the right internal jugular vein is performed under ultrasound guidance with [place] image obtained for the medical record. The guidewire is advanced under fluoroscopic guidance. Dilator is advanced over the wire and left in place and flushed.  A double-lumen power line cuffed catheter is tunneled from the venotomy site to an exit site on the right anterior chest wall after prior infiltration of the right anterior chest wall with Xylocaine plus epinephrine.  The dilator at the venotomy site is exchanged for a peel-away sheath. The double-lumen power line (equivalent to Groshong) catheter is advanced through the peel-away sheath. The peel-away sheath was removed. The catheter length is adjusted to position the catheter tip in the superior vena cava.  The catheter secured in place with 2-0 Prolene suture. The venotomy site was closed with  Dermabond.  Complications: None apparent.  Fluoroscopy time:0.3 minutes  Findings: The right internal jugular vein is patent by US imaging. An [place] image is obtained for the medical record. Catheter tip is in the superior vena cava.      07/03/2012    successful ultrasound and fluoroscopically guided placement of a right internal jugular dual-lumen cuffed power line catheter. The catheter may be used immediately.  Dara Lords, MD  07/03/2012 2:42 PM                       Larene Pickett, MD  [facility]    [phone]

## 2012-07-10 LAB — MAN DIFF ONLY
Band Neutrophils Absolute: 0.01 (ref 0.00–1.00)
Band Neutrophils: 1 %
Basophils Absolute Manual: 0 (ref 0.00–0.20)
Basophils Manual: 0 %
Eosinophils Absolute Manual: 0 (ref 0.00–0.70)
Eosinophils Manual: 0 %
Lymphocytes Absolute Manual: 0.13 — ABNORMAL LOW (ref 0.50–4.40)
Lymphocytes Manual: 20 %
Monocytes Absolute: 0.01 (ref 0.00–1.20)
Monocytes Manual: 1 %
Neutrophils Absolute Manual: 0.51 — ABNORMAL LOW (ref 1.80–8.10)
Segmented Neutrophils: 78 %

## 2012-07-10 LAB — ECG 12-LEAD
Atrial Rate: 78 {beats}/min
P Axis: 70 degrees
P-R Interval: 130 ms
Q-T Interval: 396 ms
QRS Duration: 76 ms
QTC Calculation (Bezet): 451 ms
R Axis: 29 degrees
T Axis: 34 degrees
Ventricular Rate: 78 {beats}/min

## 2012-07-10 LAB — CBC AND DIFFERENTIAL
Hematocrit: 23.9 % — ABNORMAL LOW (ref 37.0–47.0)
Hgb: 7.9 g/dL — ABNORMAL LOW (ref 12.0–16.0)
MCH: 32.5 pg — ABNORMAL HIGH (ref 28.0–32.0)
MCHC: 33.1 g/dL (ref 32.0–36.0)
MCV: 98.4 fL (ref 80.0–100.0)
MPV: 10.2 fL (ref 9.4–12.3)
Platelets: 124 — ABNORMAL LOW (ref 140–400)
RBC: 2.43 — ABNORMAL LOW (ref 4.20–5.40)
RDW: 13 % (ref 12–15)
WBC: 0.65 — ABNORMAL LOW (ref 3.50–10.80)

## 2012-07-10 LAB — CELL MORPHOLOGY: Cell Morphology: ABNORMAL — AB

## 2012-07-10 LAB — COMPREHENSIVE METABOLIC PANEL
ALT: 263 U/L — ABNORMAL HIGH (ref 0–55)
AST (SGOT): 66 U/L — ABNORMAL HIGH (ref 5–34)
Albumin/Globulin Ratio: 1.1 (ref 0.9–2.2)
Albumin: 2.6 g/dL — ABNORMAL LOW (ref 3.5–5.0)
Alkaline Phosphatase: 66 U/L (ref 40–150)
Anion Gap: 5 (ref 5.0–15.0)
BUN: 13 mg/dL (ref 7.0–19.0)
Bilirubin, Total: 0.4 mg/dL (ref 0.2–1.2)
CO2: 26 (ref 22–29)
Calcium: 8.4 mg/dL — ABNORMAL LOW (ref 8.5–10.5)
Chloride: 110 — ABNORMAL HIGH (ref 98–107)
Creatinine: 0.5 mg/dL — ABNORMAL LOW (ref 0.6–1.0)
Globulin: 2.3 g/dL (ref 2.0–3.6)
Glucose: 106 mg/dL — ABNORMAL HIGH (ref 70–100)
Potassium: 4.2 (ref 3.5–5.1)
Protein, Total: 4.9 g/dL — ABNORMAL LOW (ref 6.0–8.3)
Sodium: 141 (ref 136–145)

## 2012-07-10 LAB — HEMOLYSIS INDEX: Hemolysis Index: 11 (ref 0–18)

## 2012-07-10 LAB — GFR: EGFR: >60

## 2012-07-10 LAB — URIC ACID: Uric acid: 1.7 mg/dL — ABNORMAL LOW (ref 2.6–6.0)

## 2012-07-10 MED ORDER — LEVOFLOXACIN IN D5W 500 MG/100ML IV SOLN
500.0000 mg | INTRAVENOUS | Status: DC
Start: 2012-07-10 — End: 2012-07-13
  Administered 2012-07-10 – 2012-07-12 (×3): 500 mg via INTRAVENOUS
  Filled 2012-07-10 (×3): qty 100

## 2012-07-10 NOTE — Plan of Care (Signed)
Problem: Hemodynamic Status: Immune Suppressed-Hematologic  Goal: Stable vital signs and fluid balance  Outcome: Progressing  Patient on continuous chemotherapy cytarabine IV verified at change of shift and hanging new bag with 2nd RN.  Central line maintains good blood return.  IV antibiotics IV fluids.  Patient given senna for constipation.    Problem: Infection/Potential for Infection  Goal: Free from infection  Outcome: Progressing  Patient on neutropenic precautions patient and family educated on precautions.    Problem: Moderate/High Fall Risk Score >/=15  Goal: Patient will remain free of falls  Outcome: Progressing  Patient has remained free from falls during shift, call bell within reach and patient reminded to call if assistance is needed.

## 2012-07-10 NOTE — Progress Notes (Signed)
Infectious Diseases & Tropical Medicine  Progress Note    07/10/2012   Shimika Ames VWU:[id number] is a 57 y.o. female,       Assessment:     Acute leukemia  Rash   Blood cultures -NGTD  S/P  Groshong catheter placement  MUGA scan-ejection fraction 79%   Lyme antibody test negative  Parvovirus IgM antibody is negative (IgG antibodies-2.0)  Stool studies pending  Indigestion improved  Clinically stable  Remains afebrile  Neutropenia worsens  Clinically stable    Plan:      Tolerating chemotherapy well    Start Levaquin empirically as neutropenia is worsening   Monitor liver function tests closely   Continue probiotics   Reviewed notes   D/W patient in detail    ROS:     General: no fever, no chills, no rigor,awake and alert, feeling tired, ambulating  HEENT: no neck pain, no throat pain,stomach pain improved  Endocrine:c/o fatigue   Respiratory: no cough,no shortness of breath, or wheezing   Cardiovascular: no chest pain   Gastrointestinal: no abdominal pain,no N/V/D,abdominal bloating improved  Genito-Urinary: no dysuria, trouble voiding, or hematuria   Musculoskeletal: no edema, myalgias, leg swellling  Neurological: c/o generalized weakness  Dermatological: maculopapular rash on the back improved, no ulcer    Physical Examination:     Blood pressure 135/74, pulse 57, temperature 98.5 F (36.9 C), temperature source Oral, resp. rate 16, height 1.651 m (5\' 5" ), weight 45.36 kg (100 lb), SpO2 98.00%.     General Appearance: Comfortable, and in no acute distress. Awake,alert,feeling tired,ambulating   HEENT: Pupils are equal, round, and reactive to light.    Lungs: CTA   Heart: RRR   Chest: Symmetric chest wall expansion.minimal rash at anterior chest    Abdomen: soft ,non tender,no hepatosplenomegaly   Neurological: No focal deficit   Extremities: No edema    Laboratory And Diagnostic Studies:     Recent Labs   [facility] 07/10/12 0445 07/09/12 1820    WBC 0.65* 0.94*    HGB 7.9* 8.4*     HCT 23.9* 25.5*    PLT 124* 137*     Recent Labs   [facility] 07/10/12 0446 07/09/12 0602    NA 141 141    K 4.2 4.1    CL 110* 110*    CO2 26 26    BUN 13.0 15.0    CREAT 0.5* 0.6    GLU 106* 105*    CA 8.4* 8.8     Recent Labs   Basename 07/10/12 0446 07/09/12 0602    AST 66* 208*    ALT 263* 330*    ALKPHOS 66 68    PROT 4.9* 5.2*    ALB 2.6* 2.7*       Current Meds:      Scheduled Meds: PRN Meds:           cytarabine (CYTOSAR) continuous infusion 100 mg/m2 (Order-Specific) Intravenous Q24H   dexamethasone 8 mg Oral Daily   Or      dexamethasone 8 mg Intravenous Daily   lactobacillus/streoptococcus 1 capsule Oral Daily   pantoprazole 40 mg Oral QAM AC   polyethylene glycol 17 g Oral Daily   [DISCONTINUED] enoxaparin 40 mg Subcutaneous Daily       Continuous Infusions:       . sodium chloride 50 mL/hr at 07/10/12 0812   . sodium chloride           sodium chloride 200 mL/hr Continuous PRN  acetaminophen 650 mg Q4H PRN   acetaminophen 650 mg Q4H PRN   ALPRAZolam 0.25 mg QHS PRN   alteplase 2 mg Once PRN   alum & mag hydroxide-simethicone 30 mL Q4H PRN   diphenhydrAMINE 50 mg PRN   diphenhydrAMINE-zinc acetate  TID PRN   EPINEPHrine 0.3 mg PRN   EPINEPHrine 0.1 mg PRN   hydrocortisone 100 mg PRN   lidocaine  PRN   loperamide 4 mg Once PRN   Followed by     [START ON 07/15/2012] loperamide 2 mg PRN   LORazepam 1 mg Q4H PRN   Or     LORazepam 1 mg Q4H PRN   Magic Mouthwash 30 mL Q4H PRN   morphine 2 mg Q2H PRN   naloxone 0.2 mg PRN   senna-docusate 1 tablet Q12H PRN   simethicone 80 mg Q6H PRN   sodium phosphate 1 enema QD PRN         Andreia Gandolfi A. Janalyn Rouse, M.D.  07/10/2012  9:42 AM

## 2012-07-10 NOTE — Progress Notes (Signed)
Daily PROGRESS NOTE    Date Time: 07/10/2012 9:40 PM  Patient Name: [name], [name]  Patient Status: Inpatient  Hospital Day: 15    Assessment:   Acute Leukemia, NOS   Neutropenia  .Pancytopenia.   . Weight loss with a body mass index of 18 shows mild to moderate   Malnutrition  . Skin rash.  Epigastric pain      Plan:   1. Chemo initiated day #4 of 1  2. Neutropenic precaution  3. Prophylactic Levaquin  4. Monitor CBC, lytes  5. Protonix    Subjective:   Epigastric pain, leg swelling better  10 point Review of Systems - Negative except for the Positives mentioned above      Medications:     Current Facility-Administered Medications   Medication Dose Route Frequency   . cytarabine (CYTOSAR) continuous infusion  100 mg/m2 (Order-Specific) Intravenous Q24H   . dexamethasone  8 mg Oral Daily    Or   . dexamethasone  8 mg Intravenous Daily   . lactobacillus/streoptococcus  1 capsule Oral Daily   . levofloxacin  500 mg Intravenous Q24H   . pantoprazole  40 mg Oral QAM AC   . polyethylene glycol  17 g Oral Daily   . [DISCONTINUED] enoxaparin  40 mg Subcutaneous Daily          sodium chloride 200 mL/hr Continuous PRN   acetaminophen 650 mg Q4H PRN   acetaminophen 650 mg Q4H PRN   ALPRAZolam 0.25 mg QHS PRN   alteplase 2 mg Once PRN   alum & mag hydroxide-simethicone 30 mL Q4H PRN   diphenhydrAMINE 50 mg PRN   diphenhydrAMINE-zinc acetate  TID PRN   EPINEPHrine 0.3 mg PRN   EPINEPHrine 0.1 mg PRN   hydrocortisone 100 mg PRN   lidocaine  PRN   loperamide 4 mg Once PRN   Followed by     [START ON 07/15/2012] loperamide 2 mg PRN   LORazepam 1 mg Q4H PRN   Or     LORazepam 1 mg Q4H PRN   Magic Mouthwash 30 mL Q4H PRN   morphine 2 mg Q2H PRN   naloxone 0.2 mg PRN   senna-docusate 1 tablet Q12H PRN   simethicone 80 mg Q6H PRN   sodium phosphate 1 enema QD PRN        Physical Exam:     Filed Vitals:    07/10/12 1900   BP: 107/58   Pulse: 70   Temp: 98.5 F (36.9 C)   Resp: 18   SpO2: 98%       Intake and Output Summary (Last  24 hours) at Date Time    Intake/Output Summary (Last 24 hours) at 07/10/12 2140  Last data filed at 07/10/12 0600   Gross per 24 hour   Intake   1412 ml   Output      0 ml   Net   1412 ml       Physical Exam   Constitutional: She is oriented to person, place, and time. She appears well-developed.   HENT:   Head: Normocephalic and atraumatic.   Eyes: Pupils are equal, round, and reactive to light.   Neck: No JVD present. No tracheal deviation present. No thyromegaly present.   Cardiovascular: Normal rate and normal heart sounds.    Pulmonary/Chest: No respiratory distress. She has no wheezes. She has no rales.   Abdominal: She exhibits no distension. There is no tenderness. There is no rebound and no guarding.  Musculoskeletal: She exhibits no edema.   Neurological: She is alert and oriented to person, place, and time.   Erythematous Maculopapular rash on anterior chest is better          Labs:     Results     Procedure Component Value Units Date/Time    Manual Differential [[id number]]  (Abnormal) Collected:07/10/12 0445     Segmented Neutrophils 78 % Updated:07/10/12 0939     Band Neutrophils 1 %      Lymphocytes Manual 20 %      Monocytes Manual 1 %      Eosinophils Manual 0 %      Basophils Manual 0 %      Abs Seg Manual 0.51 (L)      Bands Absolute 0.01      Absolute Lymph Manual 0.13 (L)      Monocytes Absolute 0.01      Absolute Eos Manual 0.00      Absolute Baso Manual 0.00     Cell MorpHology [[id number]]  (Abnormal) Collected:07/10/12 0445     Cell Morphology: Abnormal (A) Updated:07/10/12 0939     Anisocytosis =1+ (A)      Poikilocytosis =1+ (A)     CBC and differential [[id number]]  (Abnormal) Collected:07/10/12 0445    Specimen Information:Blood / Blood Updated:07/10/12 0915     WBC 0.65 (L)      RBC 2.43 (L)      Hgb 7.9 (L) g/dL      Hematocrit 16.1 (L) %      MCV 98.4 fL      MCH 32.5 (H) pg      MCHC 33.1 g/dL      RDW 13 %      Platelets 124 (L)      MPV 10.2 fL     HEMOLYZED INDEX [[id number]]  Collected:07/10/12 0446     Hemolyzed Index 11 Updated:07/10/12 0530    GFR [[id number]] Collected:07/10/12 0446     EGFR >60.0 Updated:07/10/12 0530    Comprehensive metabolic panel [[id number]]  (Abnormal) Collected:07/10/12 0446    Specimen Information:Blood Updated:07/10/12 0530     Glucose 106 (H) mg/dL      BUN 09.6 mg/dL      Creatinine 0.5 (L) mg/dL      Sodium 045      Potassium 4.2      Chloride 110 (H)      CO2 26      CALCIUM 8.4 (L) mg/dL      Protein, Total 4.9 (L) g/dL      Albumin 2.6 (L) g/dL      AST (SGOT) 66 (H) U/L      ALT 263 (H) U/L      Alkaline Phosphatase 66 U/L      Bilirubin, Total 0.4 mg/dL      Globulin 2.3 g/dL      Albumin/Globulin Ratio 1.1      Anion Gap 5.0     Uric acid [[id number]]  (Abnormal) Collected:07/10/12 0446    Specimen Information:Blood Updated:07/10/12 0530     Uric acid 1.7 (L) mg/dL             Rads:     Xr Chest 2 Views    06/26/2012   CLINICAL INDICATION: pneumonia  COMPARISON: None available  FINDINGS:   2 views of the chest were obtained. The lungs are clear. The heart and vascularity are within normal limits.  There is no pleural thickening or effusion. The osseous structures are  unremarkable.      06/26/2012   No active cardiopulmonary disease   Heron Nay, MD  06/26/2012 11:12 PM     Chest 2 Views    06/25/2012  CLINICAL INDICATION: Fever  COMPARISON: None.  INTERPRETATION: Frontal and lateral views of the chest obtained. Cardiomediastinal contour within normal limits for age. Clear lungs and sharp sulci.           06/25/2012   No acute cardiopulmonary process.  Filbert Schilder, MD  06/25/2012 10:43 PM         Signed by: Eric Form, MD  Pager: [phone]

## 2012-07-10 NOTE — Progress Notes (Signed)
HEMATOLOGY ONCOLOGY PROGRESS NOTE    Date Time: 07/10/2012 9:04 AM  Patient Name: [name],[name]      IMPRESSION:     Patient Active Problem List   Diagnosis   . Chondromalacia of patella   . Neutropenic fever         PLAN: Will discontinue lovenox as she will become thrombocytopenic in the very near future.  Swelling in lower extremities is improved.  No other changes will be made in her current medications.  As per ID.  Transfusion parameters were discussed.  Ara-C will be completed on 6/29.             HISTORY: [name] is a 57 year old who was in her usual state of excellent health until approximately 5 days prior to admission when she began to experience arthralgias, myalgias, and some rhinorrhea. She was noted to have temperature   elevations to as high as 102 and 103 degrees. 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 Cytogenetics and FISH are pending.   Interval History: She started 7+3 07/06/12. Today is day 5.  She continues to have some midepigastric discomfort with symptoms suggestive of esophageal spasm.  She remains constipated.  No SOB.         PHYSICAL EXAM:     Filed Vitals:    07/10/12 0837   BP: 135/74   Pulse: 57   Temp: 98.5 F (36.9 C)   Resp: 16   SpO2: 98%       Intake and Output Summary (Last 24 hours) at Date Time    Intake/Output Summary (Last 24 hours) at 07/10/12 1027  Last data filed at 07/10/12 0600   Gross per 24 hour   Intake   1412 ml   Output      0 ml   Net   1412 ml       Appearance: in no acute distress  HEENT: pharynx clear  Lungs: clear to ausculation  Heart: regular rate and rhythm, normal heart sounds  Abdomen: flat and soft, active bowel sounds  Extremities: trace pretibial edema with no discomfort on palpation.  Neuro: alert, appropriate  MEDS:   Scheduled Meds:  Current Facility-Administered Medications   Medication Dose Route Frequency   . cytarabine (CYTOSAR) continuous infusion  100 mg/m2 (Order-Specific) Intravenous Q24H   .  dexamethasone  8 mg Oral Daily    Or   . dexamethasone  8 mg Intravenous Daily   . lactobacillus/streoptococcus  1 capsule Oral Daily   . pantoprazole  40 mg Oral QAM AC   . polyethylene glycol  17 g Oral Daily   . [DISCONTINUED] enoxaparin  40 mg Subcutaneous Daily     Continuous Infusions:     . sodium chloride 50 mL/hr at 07/10/12 0812   . sodium chloride       PRN Meds:.sodium chloride, acetaminophen, acetaminophen, ALPRAZolam, alteplase, alum & mag hydroxide-simethicone, diphenhydrAMINE, diphenhydrAMINE-zinc acetate, EPINEPHrine, EPINEPHrine, hydrocortisone, lidocaine, [START ON 07/15/2012] loperamide, loperamide, LORazepam, LORazepam, Magic Mouthwash, morphine, naloxone, senna-docusate, simethicone, sodium phosphate    LABS:     Results     Procedure Component Value Units Date/Time    CBC and differential [[id number]]  (Abnormal) Collected:07/10/12 0445    Specimen Information:Blood / Blood Updated:07/10/12 0756     WBC 0.65 (L)      RBC 2.43 (L)      Hgb 7.9 (L) g/dL      Hematocrit 25.3 (L) %      MCV 98.4  fL      MCH 32.5 (H) pg      MCHC 33.1 g/dL      RDW 13 %      Platelets 124 (L)      MPV 10.2 fL     HEMOLYZED INDEX [[id number]] Collected:07/10/12 0446     Hemolyzed Index 11 Updated:07/10/12 0530    GFR [[id number]] Collected:07/10/12 0446     EGFR >60.0 Updated:07/10/12 0530    Comprehensive metabolic panel [[id number]]  (Abnormal) Collected:07/10/12 0446    Specimen Information:Blood Updated:07/10/12 0530     Glucose 106 (H) mg/dL      BUN 91.4 mg/dL      Creatinine 0.5 (L) mg/dL      Sodium 782      Potassium 4.2      Chloride 110 (H)      CO2 26      CALCIUM 8.4 (L) mg/dL      Protein, Total 4.9 (L) g/dL      Albumin 2.6 (L) g/dL      AST (SGOT) 66 (H) U/L      ALT 263 (H) U/L      Alkaline Phosphatase 66 U/L      Bilirubin, Total 0.4 mg/dL      Globulin 2.3 g/dL      Albumin/Globulin Ratio 1.1      Anion Gap 5.0     Uric acid [[id number]]  (Abnormal) Collected:07/10/12 0446    Specimen Information:Blood  Updated:07/10/12 0530     Uric acid 1.7 (L) mg/dL     Cell MorpHology [[id number]]  (Abnormal) Collected:07/09/12 1820     Cell Morphology: Abnormal (A) Updated:07/09/12 1902     Anisocytosis =1+ (A)      Ovalocytes =1+ (A)     Manual Differential [[id number]]  (Abnormal) Collected:07/09/12 1820     Segmented Neutrophils 82 % Updated:07/09/12 1902     Band Neutrophils 0 %      Lymphocytes Manual 15 %      Monocytes Manual 3 %      Eosinophils Manual 0 %      Basophils Manual 0 %      Nucleated RBC 0      Abs Seg Manual 0.77 (L)      Bands Absolute 0.00      Absolute Lymph Manual 0.14 (L)      Monocytes Absolute 0.03      Absolute Eos Manual 0.00      Absolute Baso Manual 0.00     CBC and differential [[id number]]  (Abnormal) Collected:07/09/12 1820    Specimen Information:Blood / Blood Updated:07/09/12 1902     WBC 0.94 (L)      RBC 2.60 (L)      Hgb 8.4 (L) g/dL      Hematocrit 52.8 (L) %      MCV 98.1 fL      MCH 32.3 (H) pg      MCHC 32.9 g/dL      RDW 13 %      Platelets 137 (L)      MPV 10.1 fL     Uric acid [[id number]]  (Abnormal) Collected:07/09/12 1819    Specimen Information:Blood Updated:07/09/12 1855     Uric acid 1.7 (L) mg/dL     HEMOLYZED INDEX [[id number]] Collected:07/09/12 1819     Hemolyzed Index 7 Updated:07/09/12 1855          IMAGING DATA:  Xr Chest 2 Views    06/26/2012   CLINICAL INDICATION: pneumonia  COMPARISON: None available  FINDINGS:   2 views of the chest were obtained. The lungs are clear. The heart and vascularity are within normal limits.  There is no pleural thickening or effusion. The osseous structures are unremarkable.      06/26/2012   No active cardiopulmonary disease   Heron Nay, MD  06/26/2012 11:12 PM     Chest 2 Views    06/25/2012  CLINICAL INDICATION: Fever  COMPARISON: None.  INTERPRETATION: Frontal and lateral views of the chest obtained. Cardiomediastinal contour within normal limits for age. Clear lungs and sharp sulci.           06/25/2012   No acute cardiopulmonary  process.  Mitali  Bapna, MD  06/25/2012 10:43 PM     Ct Sinus Facial Bones Wo Contrast    06/28/2012  HISTORY: Sinusitis  COMPARISON: None.  TECHNIQUE: Axial CT scans slices of the paranasal sinuses and facial bones with sagittal and coronal reformats  FINDINGS: No fracture or other bony abnormality. The sinuses are clear. The ostiomeatal complexes are patent. There is no mass or nasopharynx. There is a left concha bullosa. The nasal septum is midline.      06/28/2012   Normal  Lynnae Prude, MD  06/28/2012 2:42 PM     Ct Chest Wo Contrast    06/28/2012  HISTORY: Cough  COMPARISON: None  TECHNIQUE:  Helical CT scan of the chest was obtained from the apices to the lung bases without intravenous contrast.      FINDINGS:  Lungs and central airways: Atelectasis or scarring in the lower lobes. Pleura: Trace effusions bilaterally. Aorta and Great Vessels: Within normal limits. Heart: No pericardial effusion. No coronary artery calcification. Mediastinum and hila: No mass or adenopathy Upper Abdomen: No significant abnormality. Bones: No significant abnormality. Chest wall and lower neck: Normal       06/28/2012   Atelectasis or scarring in the lower lobes. Trace bilateral pleural effusions.  Lynnae Prude, MD  06/28/2012 2:47 PM     Nm Cardiac Muga (at Rest)    07/02/2012  CLINICAL INDICATION: Prechemotherapy evaluation. Leukemia.  TECHNIQUE: A dynamic MUGA scan was obtained. Imaging obtained in the LAO, lateral, and anterior projections. The left ventricle region of interest was drawn, on the best separation view, in end-diastole and end-systole, and ejection fraction was calculated. 23.4 mCi of Tc3m labeled autologous RBCs, using the Ultratag technique was administered.  FINDINGS: Normal wall motion is seen in the left ventricle. The ejection fraction is calculated at 79%, which is normal.      07/02/2012   NORMAL EJECTION FRACTION, OF 79%.  Darnelle Maffucci, MD  07/02/2012 2:23 PM     Biopsy, Bone Marrow    06/29/2012  EXAMINATION:  Fluoroscopic guided bone marrow aspirate and core biopsy.  INTERVENTIONALIST: Verlee Rossetti, MD.  HISTORY:  Patient is a 57 year old female with pancytopenia and neutropenic fever.  Anesthesia: Moderate sedation with Fentanyl and Versed was administered with appropriate physiologic monitoring using an independent trained observer for 30 minutes. 1% lidocaine local was administered as well.  TECHNIQUE: With the patient in the prone position the skin over the left iliac crest was prepped and draped in usual sterile fashion. Local anesthesia was applied to superficial and deep tissues with 2% lidocaine. Subsequently, under direct fluoroscopic guidance, a bone marrow needle was advanced to the posteromedial aspect of the superior left iliac crest. The outer cortex was traversed and bone marrow aspiration performed and the needle removed.  The bone marrow core needle was then advanced to a separate site on the iliac crest to obtain a core of bone marrow tissue. The samples were given to hematology for analysis. The patient tolerated procedure well without apparent competition.  INTERPRETATION: Fluoroscopy revealed successful access to the left iliac crest marrow cavity for bone marrow aspiration and for core biopsy.  Fluoroscopy Time: 0.3 min      06/29/2012   Technically successful fluoroscopic guided bone marrow aspirate and core biopsy for pancytopenia.  Larrie Kass, MD  06/29/2012 4:21 PM     Tunneled Cath Placement (permcath)    07/03/2012   Clinical history: Power line  Interventionalist: Dara Lords, MD  Informed consent obtained and all questions answered.  Anesthesia:  Moderate sedation with appropriate monitoring plus local. This consisted of Versed and fentanyl administration with continuous hemodynamic monitoring with a trained observer. Total anesthesia time with direct physician supervision 20 minutes.  Procedure: After standard prepping and draping with maximal sterile barrier technique and local  anesthesia with 2% Xylocaine, percutaneous puncture of the right internal jugular vein is performed under ultrasound guidance with [place] image obtained for the medical record. The guidewire is advanced under fluoroscopic guidance. Dilator is advanced over the wire and left in place and flushed.  A double-lumen power line cuffed catheter is tunneled from the venotomy site to an exit site on the right anterior chest wall after prior infiltration of the right anterior chest wall with Xylocaine plus epinephrine.  The dilator at the venotomy site is exchanged for a peel-away sheath. The double-lumen power line (equivalent to Groshong) catheter is advanced through the peel-away sheath. The peel-away sheath was removed. The catheter length is adjusted to position the catheter tip in the superior vena cava.  The catheter secured in place with 2-0 Prolene suture. The venotomy site was closed with  Dermabond.  Complications: None apparent.  Fluoroscopy time:0.3 minutes  Findings: The right internal jugular vein is patent by [place] imaging. An [place] image is obtained for the medical record. Catheter tip is in the superior vena cava.      07/03/2012    successful ultrasound and fluoroscopically guided placement of a right internal jugular dual-lumen cuffed power line catheter. The catheter may be used immediately.  Dara Lords, MD  07/03/2012 2:42 PM                       Larene Pickett, MD  [facility]    [phone]        HEMATOLOGY ONCOLOGY PROGRESS NOTE    Date Time: 07/10/2012 9:04 AM  Patient Name: [name],[name]      IMPRESSION:     Patient Active Problem List   Diagnosis   . Chondromalacia of patella   . Neutropenic fever         PLAN:             HISTORY:       PHYSICAL EXAM:     Filed Vitals:    07/10/12 0837   BP: 135/74   Pulse: 57   Temp: 98.5 F (36.9 C)   Resp: 16   SpO2: 98%       Intake and Output Summary (Last 24 hours) at Date Time    Intake/Output Summary (Last 24 hours) at 07/10/12 0904  Last  data filed at 07/10/12 0600   Gross per 24 hour   Intake   1412 ml  Output      0 ml   Net   1412 ml       Appearance: in no acute distress  HEENT: pharynx clear  Lungs: clear to ausculation  Heart: regular rate and rhythm, normal heart sounds  Abdomen: flat and soft, active bowel sounds  Extremities: no edema  Neuro: alert, appropriate  MEDS:   Scheduled Meds:  Current Facility-Administered Medications   Medication Dose Route Frequency   . cytarabine (CYTOSAR) continuous infusion  100 mg/m2 (Order-Specific) Intravenous Q24H   . dexamethasone  8 mg Oral Daily    Or   . dexamethasone  8 mg Intravenous Daily   . lactobacillus/streoptococcus  1 capsule Oral Daily   . pantoprazole  40 mg Oral QAM AC   . polyethylene glycol  17 g Oral Daily   . [DISCONTINUED] enoxaparin  40 mg Subcutaneous Daily     Continuous Infusions:     . sodium chloride 50 mL/hr at 07/10/12 0812   . sodium chloride       PRN Meds:.sodium chloride, acetaminophen, acetaminophen, ALPRAZolam, alteplase, alum & mag hydroxide-simethicone, diphenhydrAMINE, diphenhydrAMINE-zinc acetate, EPINEPHrine, EPINEPHrine, hydrocortisone, lidocaine, [START ON 07/15/2012] loperamide, loperamide, LORazepam, LORazepam, Magic Mouthwash, morphine, naloxone, senna-docusate, simethicone, sodium phosphate    LABS:     Results     Procedure Component Value Units Date/Time    CBC and differential [[id number]]  (Abnormal) Collected:07/10/12 0445    Specimen Information:Blood / Blood Updated:07/10/12 0756     WBC 0.65 (L)      RBC 2.43 (L)      Hgb 7.9 (L) g/dL      Hematocrit 16.1 (L) %      MCV 98.4 fL      MCH 32.5 (H) pg      MCHC 33.1 g/dL      RDW 13 %      Platelets 124 (L)      MPV 10.2 fL     HEMOLYZED INDEX [[id number]] Collected:07/10/12 0446     Hemolyzed Index 11 Updated:07/10/12 0530    GFR [[id number]] Collected:07/10/12 0446     EGFR >60.0 Updated:07/10/12 0530    Comprehensive metabolic panel [[id number]]  (Abnormal) Collected:07/10/12 0446    Specimen  Information:Blood Updated:07/10/12 0530     Glucose 106 (H) mg/dL      BUN 09.6 mg/dL      Creatinine 0.5 (L) mg/dL      Sodium 045      Potassium 4.2      Chloride 110 (H)      CO2 26      CALCIUM 8.4 (L) mg/dL      Protein, Total 4.9 (L) g/dL      Albumin 2.6 (L) g/dL      AST (SGOT) 66 (H) U/L      ALT 263 (H) U/L      Alkaline Phosphatase 66 U/L      Bilirubin, Total 0.4 mg/dL      Globulin 2.3 g/dL      Albumin/Globulin Ratio 1.1      Anion Gap 5.0     Uric acid [[id number]]  (Abnormal) Collected:07/10/12 0446    Specimen Information:Blood Updated:07/10/12 0530     Uric acid 1.7 (L) mg/dL     Cell MorpHology [[id number]]  (Abnormal) Collected:07/09/12 1820     Cell Morphology: Abnormal (A) Updated:07/09/12 1902     Anisocytosis =1+ (A)      Ovalocytes =1+ (A)     Manual Differential [[id number]]  (Abnormal) Collected:07/09/12  1820     Segmented Neutrophils 82 % Updated:07/09/12 1902     Band Neutrophils 0 %      Lymphocytes Manual 15 %      Monocytes Manual 3 %      Eosinophils Manual 0 %      Basophils Manual 0 %      Nucleated RBC 0      Abs Seg Manual 0.77 (L)      Bands Absolute 0.00      Absolute Lymph Manual 0.14 (L)      Monocytes Absolute 0.03      Absolute Eos Manual 0.00      Absolute Baso Manual 0.00     CBC and differential [[id number]]  (Abnormal) Collected:07/09/12 1820    Specimen Information:Blood / Blood Updated:07/09/12 1902     WBC 0.94 (L)      RBC 2.60 (L)      Hgb 8.4 (L) g/dL      Hematocrit 19.1 (L) %      MCV 98.1 fL      MCH 32.3 (H) pg      MCHC 32.9 g/dL      RDW 13 %      Platelets 137 (L)      MPV 10.1 fL     Uric acid [[id number]]  (Abnormal) Collected:07/09/12 1819    Specimen Information:Blood Updated:07/09/12 1855     Uric acid 1.7 (L) mg/dL     HEMOLYZED INDEX [[id number]] Collected:07/09/12 1819     Hemolyzed Index 7 Updated:07/09/12 1855          IMAGING DATA:  Xr Chest 2 Views    06/26/2012   CLINICAL INDICATION: pneumonia  COMPARISON: None available  FINDINGS:   2 views of the  chest were obtained. The lungs are clear. The heart and vascularity are within normal limits.  There is no pleural thickening or effusion. The osseous structures are unremarkable.      06/26/2012   No active cardiopulmonary disease   Heron Nay, MD  06/26/2012 11:12 PM     Chest 2 Views    06/25/2012  CLINICAL INDICATION: Fever  COMPARISON: None.  INTERPRETATION: Frontal and lateral views of the chest obtained. Cardiomediastinal contour within normal limits for age. Clear lungs and sharp sulci.           06/25/2012   No acute cardiopulmonary process.  Mitali  Bapna, MD  06/25/2012 10:43 PM     Ct Sinus Facial Bones Wo Contrast    06/28/2012  HISTORY: Sinusitis  COMPARISON: None.  TECHNIQUE: Axial CT scans slices of the paranasal sinuses and facial bones with sagittal and coronal reformats  FINDINGS: No fracture or other bony abnormality. The sinuses are clear. The ostiomeatal complexes are patent. There is no mass or nasopharynx. There is a left concha bullosa. The nasal septum is midline.      06/28/2012   Normal  Lynnae Prude, MD  06/28/2012 2:42 PM     Ct Chest Wo Contrast    06/28/2012  HISTORY: Cough  COMPARISON: None  TECHNIQUE:  Helical CT scan of the chest was obtained from the apices to the lung bases without intravenous contrast.      FINDINGS:  Lungs and central airways: Atelectasis or scarring in the lower lobes. Pleura: Trace effusions bilaterally. Aorta and Great Vessels: Within normal limits. Heart: No pericardial effusion. No coronary artery calcification. Mediastinum and hila: No mass or adenopathy Upper Abdomen: No significant abnormality. Bones: No significant  abnormality. Chest wall and lower neck: Normal       06/28/2012   Atelectasis or scarring in the lower lobes. Trace bilateral pleural effusions.  Lynnae Prude, MD  06/28/2012 2:47 PM     Nm Cardiac Muga (at Rest)    07/02/2012  CLINICAL INDICATION: Prechemotherapy evaluation. Leukemia.  TECHNIQUE: A dynamic MUGA scan was obtained. Imaging obtained in  the LAO, lateral, and anterior projections. The left ventricle region of interest was drawn, on the best separation view, in end-diastole and end-systole, and ejection fraction was calculated. 23.4 mCi of Tc30m labeled autologous RBCs, using the Ultratag technique was administered.  FINDINGS: Normal wall motion is seen in the left ventricle. The ejection fraction is calculated at 79%, which is normal.      07/02/2012   NORMAL EJECTION FRACTION, OF 79%.  Darnelle Maffucci, MD  07/02/2012 2:23 PM     Biopsy, Bone Marrow    06/29/2012  EXAMINATION: Fluoroscopic guided bone marrow aspirate and core biopsy.  INTERVENTIONALIST: Verlee Rossetti, MD.  HISTORY:  Patient is a 57 year old female with pancytopenia and neutropenic fever.  Anesthesia: Moderate sedation with Fentanyl and Versed was administered with appropriate physiologic monitoring using an independent trained observer for 30 minutes. 1% lidocaine local was administered as well.  TECHNIQUE: With the patient in the prone position the skin over the left iliac crest was prepped and draped in usual sterile fashion. Local anesthesia was applied to superficial and deep tissues with 2% lidocaine. Subsequently, under direct fluoroscopic guidance, a bone marrow needle was advanced to the posteromedial aspect of the superior left iliac crest. The outer cortex was traversed and bone marrow aspiration performed and the needle removed.    The bone marrow core needle was then advanced to a separate site on the iliac crest to obtain a core of bone marrow tissue. The samples were given to hematology for analysis. The patient tolerated procedure well without apparent competition.  INTERPRETATION: Fluoroscopy revealed successful access to the left iliac crest marrow cavity for bone marrow aspiration and for core biopsy.  Fluoroscopy Time: 0.3 min      06/29/2012   Technically successful fluoroscopic guided bone marrow aspirate and core biopsy for pancytopenia.  Larrie Kass, MD  06/29/2012  4:21 PM     Tunneled Cath Placement (permcath)    07/03/2012   Clinical history: Power line  Interventionalist: Dara Lords, MD  Informed consent obtained and all questions answered.  Anesthesia:  Moderate sedation with appropriate monitoring plus local. This consisted of Versed and fentanyl administration with continuous hemodynamic monitoring with a trained observer. Total anesthesia time with direct physician supervision 20 minutes.  Procedure: After standard prepping and draping with maximal sterile barrier technique and local anesthesia with 2% Xylocaine, percutaneous puncture of the right internal jugular vein is performed under ultrasound guidance with [place] image obtained for the medical record. The guidewire is advanced under fluoroscopic guidance. Dilator is advanced over the wire and left in place and flushed.  A double-lumen power line cuffed catheter is tunneled from the venotomy site to an exit site on the right anterior chest wall after prior infiltration of the right anterior chest wall with Xylocaine plus epinephrine.  The dilator at the venotomy site is exchanged for a peel-away sheath. The double-lumen power line (equivalent to Groshong) catheter is advanced through the peel-away sheath. The peel-away sheath was removed. The catheter length is adjusted to position the catheter tip in the superior vena cava.  The catheter  secured in place with 2-0 Prolene suture. The venotomy site was closed with  Dermabond.  Complications: None apparent.  Fluoroscopy time:0.3 minutes  Findings: The right internal jugular vein is patent by [place] imaging. An [place] image is obtained for the medical record. Catheter tip is in the superior vena cava.      07/03/2012    successful ultrasound and fluoroscopically guided placement of a right internal jugular dual-lumen cuffed power line catheter. The catheter may be used immediately.  Dara Lords, MD  07/03/2012 2:42 PM                       Larene Pickett, MD  [facility]    [phone]

## 2012-07-11 LAB — COMPREHENSIVE METABOLIC PANEL
ALT: 208 U/L — ABNORMAL HIGH (ref 0–55)
AST (SGOT): 34 U/L (ref 5–34)
Albumin/Globulin Ratio: 1 (ref 0.9–2.2)
Albumin: 2.5 g/dL — ABNORMAL LOW (ref 3.5–5.0)
Alkaline Phosphatase: 61 U/L (ref 40–150)
Anion Gap: 2 — ABNORMAL LOW (ref 5.0–15.0)
BUN: 12 mg/dL (ref 7.0–19.0)
Bilirubin, Total: 0.6 mg/dL (ref 0.2–1.2)
CO2: 27 (ref 22–29)
Calcium: 8.7 mg/dL (ref 8.5–10.5)
Chloride: 111 — ABNORMAL HIGH (ref 98–107)
Creatinine: 0.5 mg/dL — ABNORMAL LOW (ref 0.6–1.0)
Globulin: 2.4 g/dL (ref 2.0–3.6)
Glucose: 102 mg/dL — ABNORMAL HIGH (ref 70–100)
Potassium: 4.2 (ref 3.5–5.1)
Protein, Total: 4.9 g/dL — ABNORMAL LOW (ref 6.0–8.3)
Sodium: 140 (ref 136–145)

## 2012-07-11 LAB — PREPARE RBC: RBC Leukoreduced Irradiated: TRANSFUSED

## 2012-07-11 LAB — CBC AND DIFFERENTIAL
Hematocrit: 21.3 % — ABNORMAL LOW (ref 37.0–47.0)
Hgb: 7.3 g/dL — ABNORMAL LOW (ref 12.0–16.0)
MCH: 32.6 pg — ABNORMAL HIGH (ref 28.0–32.0)
MCHC: 34.3 g/dL (ref 32.0–36.0)
MCV: 95.1 fL (ref 80.0–100.0)
MPV: 10.3 fL (ref 9.4–12.3)
Platelets: 107 — ABNORMAL LOW (ref 140–400)
RBC: 2.24 — ABNORMAL LOW (ref 4.20–5.40)
RDW: 13 % (ref 12–15)
WBC: 0.49 — ABNORMAL LOW (ref 3.50–10.80)

## 2012-07-11 LAB — MAN DIFF ONLY
Band Neutrophils Absolute: 0 (ref 0.00–1.00)
Band Neutrophils: 0 %
Basophils Absolute Manual: 0 (ref 0.00–0.20)
Basophils Manual: 0 %
Eosinophils Absolute Manual: 0 (ref 0.00–0.70)
Eosinophils Manual: 1 %
Lymphocytes Absolute Manual: 0.21 — ABNORMAL LOW (ref 0.50–4.40)
Lymphocytes Manual: 42 %
Monocytes Absolute: 0 (ref 0.00–1.20)
Monocytes Manual: 0 %
Neutrophils Absolute Manual: 0.28 — ABNORMAL LOW (ref 1.80–8.10)
Nucleated RBC: 0 (ref 0–1)
Segmented Neutrophils: 57 %

## 2012-07-11 LAB — HEMOLYSIS INDEX: Hemolysis Index: 5 (ref 0–18)

## 2012-07-11 LAB — CELL MORPHOLOGY: Cell Morphology: ABNORMAL — AB

## 2012-07-11 LAB — TYPE AND SCREEN
AB Screen Gel: NEGATIVE
ABO Rh: O POS

## 2012-07-11 LAB — ABO/RH: ABO Rh: O POS

## 2012-07-11 LAB — GFR: EGFR: >60

## 2012-07-11 MED ORDER — DIPHENHYDRAMINE HCL 25 MG PO CAPS
25.00 mg | ORAL_CAPSULE | Freq: Once | ORAL | Status: AC
Start: 2012-07-11 — End: 2012-07-11
  Administered 2012-07-11: 25 mg via ORAL
  Filled 2012-07-11: qty 1

## 2012-07-11 MED ORDER — ACETAMINOPHEN 325 MG PO TABS
650.0000 mg | ORAL_TABLET | Freq: Once | ORAL | Status: AC
Start: 2012-07-11 — End: 2012-07-11
  Administered 2012-07-11: 650 mg via ORAL
  Filled 2012-07-11: qty 2

## 2012-07-11 NOTE — Progress Notes (Signed)
Daily PROGRESS NOTE    Date Time: 07/11/2012 7:31 PM  Patient Name: [name], [name]  Patient Status: Inpatient  Hospital Day: 16    Assessment:   Acute Leukemia, NOS   Neutropenia  .Pancytopenia.   . Weight loss with a body mass index of 18 shows mild to moderate   Malnutrition  . Skin rash.  Epigastric pain      Plan:   1. Chemo initiated # 1  2. Neutropenic precaution  3. Prophylactic Levaquin  4. Monitor CBC, lytes  5. Transfuse RBCs, Platelets as needed    Subjective:   Epigastric pain, leg swelling better. Mild SOB  10 point Review of Systems - Negative except for the Positives mentioned above      Medications:     Current Facility-Administered Medications   Medication Dose Route Frequency   . [COMPLETED] acetaminophen  650 mg Oral Once   . cytarabine (CYTOSAR) continuous infusion  100 mg/m2 (Order-Specific) Intravenous Q24H   . dexamethasone  8 mg Oral Daily    Or   . dexamethasone  8 mg Intravenous Daily   . [COMPLETED] diphenhydrAMINE  25 mg Oral Once   . lactobacillus/streoptococcus  1 capsule Oral Daily   . levofloxacin  500 mg Intravenous Q24H   . pantoprazole  40 mg Oral QAM AC   . polyethylene glycol  17 g Oral Daily          sodium chloride 200 mL/hr Continuous PRN   acetaminophen 650 mg Q4H PRN   acetaminophen 650 mg Q4H PRN   ALPRAZolam 0.25 mg QHS PRN   alteplase 2 mg Once PRN   alum & mag hydroxide-simethicone 30 mL Q4H PRN   diphenhydrAMINE 50 mg PRN   diphenhydrAMINE-zinc acetate  TID PRN   EPINEPHrine 0.3 mg PRN   EPINEPHrine 0.1 mg PRN   hydrocortisone 100 mg PRN   lidocaine  PRN   loperamide 4 mg Once PRN   Followed by     [START ON 07/15/2012] loperamide 2 mg PRN   LORazepam 1 mg Q4H PRN   Or     LORazepam 1 mg Q4H PRN   Magic Mouthwash 30 mL Q4H PRN   morphine 2 mg Q2H PRN   naloxone 0.2 mg PRN   senna-docusate 1 tablet Q12H PRN   simethicone 80 mg Q6H PRN   sodium phosphate 1 enema QD PRN        Physical Exam:     Filed Vitals:    07/11/12 1343   BP: 121/73   Pulse: 55   Temp: 98.8 F  (37.1 C)   Resp: 18   SpO2: 97%       Intake and Output Summary (Last 24 hours) at Date Time    Intake/Output Summary (Last 24 hours) at 07/11/12 1931  Last data filed at 07/11/12 1718   Gross per 24 hour   Intake    600 ml   Output      0 ml   Net    600 ml       Physical Exam   Constitutional: She is oriented to person, place, and time. She appears well-developed.   HENT:   Head: Normocephalic and atraumatic.   Eyes: Pupils are equal, round, and reactive to light.   Neck: No JVD present. No tracheal deviation present. No thyromegaly present.   Cardiovascular: Normal rate and normal heart sounds.    Pulmonary/Chest: No respiratory distress. She has no wheezes. She has no rales.  Abdominal: She exhibits no distension. There is no tenderness. There is no rebound and no guarding.   Musculoskeletal: She exhibits no edema.   Neurological: She is alert and oriented to person, place, and time.   Erythematous Maculopapular rash on anterior chest is better          Labs:     Results     Procedure Component Value Units Date/Time    Packed Red Blood Cells - Product [[id number]] Collected:07/11/12 1043     RBC Leukoreduced Irradiated [id number]    issued Updated:07/11/12 1324     RBC Leukoreduced Irradiated [id number]    released     Type and Screen: if there is no current type and screen in blood bank [id number] Collected:07/11/12 1043    Specimen Information:Blood Updated:07/11/12 1157     ABO Rh O POS      AB Screen Gel NEG     ABO/Rh [[id number]] Collected:07/11/12 0556     ABO Rh O POS Updated:07/11/12 1151    Manual Differential [[id number]]  (Abnormal) Collected:07/11/12 0556     Segmented Neutrophils 57 % Updated:07/11/12 0759     Band Neutrophils 0 %      Lymphocytes Manual 42 %      Monocytes Manual 0 %      Eosinophils Manual 1 %      Basophils Manual 0 %      Nucleated RBC 0      Abs Seg Manual 0.28 (L)      Bands Absolute 0.00      Absolute Lymph Manual 0.21 (L)      Monocytes Absolute 0.00      Absolute Eos  Manual 0.00      Absolute Baso Manual 0.00     Cell MorpHology [[id number]]  (Abnormal) Collected:07/11/12 0556     Cell Morphology: Abnormal (A) Updated:07/11/12 0759     Anisocytosis =1+ (A)      Ovalocytes =1+ (A)     CBC and differential [[id number]]  (Abnormal) Collected:07/11/12 0556    Specimen Information:Blood / Blood Updated:07/11/12 0759     WBC 0.49 (L)      RBC 2.24 (L)      Hgb 7.3 (L) g/dL      Hematocrit 32.4 (L) %      MCV 95.1 fL      MCH 32.6 (H) pg      MCHC 34.3 g/dL      RDW 13 %      Platelets 107 (L)      MPV 10.3 fL     GFR [[id number]] Collected:07/11/12 0556     EGFR >60.0 Updated:07/11/12 0648    Comprehensive metabolic panel [[id number]]  (Abnormal) Collected:07/11/12 0556    Specimen Information:Blood Updated:07/11/12 0648     Glucose 102 (H) mg/dL      BUN 40.1 mg/dL      Creatinine 0.5 (L) mg/dL      Sodium 027      Potassium 4.2      Chloride 111 (H)      CO2 27      CALCIUM 8.7 mg/dL      Protein, Total 4.9 (L) g/dL      Albumin 2.5 (L) g/dL      AST (SGOT) 34 U/L      ALT 208 (H) U/L      Alkaline Phosphatase 61 U/L      Bilirubin, Total 0.6 mg/dL      Globulin 2.4 g/dL  Albumin/Globulin Ratio 1.0      Anion Gap 2.0 (L)     HEMOLYZED INDEX [[id number]] Collected:07/11/12 0556     Hemolyzed Index 5 Updated:07/11/12 0648            Rads:     Xr Chest 2 Views    06/26/2012   CLINICAL INDICATION: pneumonia  COMPARISON: None available  FINDINGS:   2 views of the chest were obtained. The lungs are clear. The heart and vascularity are within normal limits.  There is no pleural thickening or effusion. The osseous structures are unremarkable.      06/26/2012   No active cardiopulmonary disease   Heron Nay, MD  06/26/2012 11:12 PM     Chest 2 Views    06/25/2012  CLINICAL INDICATION: Fever  COMPARISON: None.  INTERPRETATION: Frontal and lateral views of the chest obtained. Cardiomediastinal contour within normal limits for age. Clear lungs and sharp sulci.           06/25/2012   No acute  cardiopulmonary process.  Filbert Schilder, MD  06/25/2012 10:43 PM         Signed by: Eric Form, MD  Pager: [phone]

## 2012-07-11 NOTE — Progress Notes (Signed)
HEMATOLOGY ONCOLOGY PROGRESS NOTE    Date Time: 07/11/2012 10:03 AM  Patient Name: [name]      IMPRESSION:     Patient Active Problem List   Diagnosis   . Chondromalacia of patella   . Neutropenic fever         PLAN:  No change will be made in her current medications.  She will be transfused with one unit of PRBCs.  Ambulation encouraged.             HISTORY: [name] is a 57 year old who was in her usual state of excellent health until approximately 5 days prior to admission when she began to experience arthralgias, myalgias, and some rhinorrhea. She was noted to have temperature   elevations to as high as 102 and 103 degrees. 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 Cytogenetics revealed a normal 46XX chromosomal pattern.  She is bcr/abl negative and FISH are pending.   Interval History: She started 7+3 07/06/12. Today is day 6.  Interval History:  ROS remarkable for fatigue and some midepigastric pain.  No obvious mucosal bleeding.         PHYSICAL EXAM:     Filed Vitals:    07/11/12 0923   BP: 123/64   Pulse: 51   Temp: 98.6 F (37 C)   Resp: 18   SpO2: 96%       Intake and Output Summary (Last 24 hours) at Date Time  No intake or output data in the 24 hours ending 07/11/12 1003    Appearance: in no acute distress  HEENT: pharynx clear  Lungs: clear to ausculation  Heart: regular rate and rhythm, normal heart sounds  Abdomen: flat and soft, active bowel sounds  Extremities: no edema  Neuro: alert, appropriate  MEDS:   Scheduled Meds:  Current Facility-Administered Medications   Medication Dose Route Frequency   . acetaminophen  650 mg Oral Once   . cytarabine (CYTOSAR) continuous infusion  100 mg/m2 (Order-Specific) Intravenous Q24H   . dexamethasone  8 mg Oral Daily    Or   . dexamethasone  8 mg Intravenous Daily   . diphenhydrAMINE  25 mg Oral Once   . lactobacillus/streoptococcus  1 capsule Oral Daily   . levofloxacin  500 mg Intravenous Q24H   . pantoprazole  40 mg  Oral QAM AC   . polyethylene glycol  17 g Oral Daily     Continuous Infusions:     . sodium chloride 50 mL/hr at 07/11/12 0525   . sodium chloride       PRN Meds:.sodium chloride, acetaminophen, acetaminophen, ALPRAZolam, alteplase, alum & mag hydroxide-simethicone, diphenhydrAMINE, diphenhydrAMINE-zinc acetate, EPINEPHrine, EPINEPHrine, hydrocortisone, lidocaine, [START ON 07/15/2012] loperamide, loperamide, LORazepam, LORazepam, Magic Mouthwash, morphine, naloxone, senna-docusate, simethicone, sodium phosphate    LABS:     Results     Procedure Component Value Units Date/Time    Manual Differential [[id number]]  (Abnormal) Collected:07/11/12 0556     Segmented Neutrophils 57 % Updated:07/11/12 0759     Band Neutrophils 0 %      Lymphocytes Manual 42 %      Monocytes Manual 0 %      Eosinophils Manual 1 %      Basophils Manual 0 %      Nucleated RBC 0      Abs Seg Manual 0.28 (L)      Bands Absolute 0.00      Absolute Lymph Manual 0.21 (L)  Monocytes Absolute 0.00      Absolute Eos Manual 0.00      Absolute Baso Manual 0.00     Cell MorpHology [[id number]]  (Abnormal) Collected:07/11/12 0556     Cell Morphology: Abnormal (A) Updated:07/11/12 0759     Anisocytosis =1+ (A)      Ovalocytes =1+ (A)     CBC and differential [[id number]]  (Abnormal) Collected:07/11/12 0556    Specimen Information:Blood / Blood Updated:07/11/12 0759     WBC 0.49 (L)      RBC 2.24 (L)      Hgb 7.3 (L) g/dL      Hematocrit 54.0 (L) %      MCV 95.1 fL      MCH 32.6 (H) pg      MCHC 34.3 g/dL      RDW 13 %      Platelets 107 (L)      MPV 10.3 fL     GFR [[id number]] Collected:07/11/12 0556     EGFR >60.0 Updated:07/11/12 0648    Comprehensive metabolic panel [[id number]]  (Abnormal) Collected:07/11/12 0556    Specimen Information:Blood Updated:07/11/12 0648     Glucose 102 (H) mg/dL      BUN 98.1 mg/dL      Creatinine 0.5 (L) mg/dL      Sodium 191      Potassium 4.2      Chloride 111 (H)      CO2 27      CALCIUM 8.7 mg/dL      Protein, Total 4.9  (L) g/dL      Albumin 2.5 (L) g/dL      AST (SGOT) 34 U/L      ALT 208 (H) U/L      Alkaline Phosphatase 61 U/L      Bilirubin, Total 0.6 mg/dL      Globulin 2.4 g/dL      Albumin/Globulin Ratio 1.0      Anion Gap 2.0 (L)     HEMOLYZED INDEX [[id number]] Collected:07/11/12 0556     Hemolyzed Index 5 Updated:07/11/12 0648          IMAGING DATA:  Xr Chest 2 Views    06/26/2012   CLINICAL INDICATION: pneumonia  COMPARISON: None available  FINDINGS:   2 views of the chest were obtained. The lungs are clear. The heart and vascularity are within normal limits.  There is no pleural thickening or effusion. The osseous structures are unremarkable.      06/26/2012   No active cardiopulmonary disease   Heron Nay, MD  06/26/2012 11:12 PM     Chest 2 Views    06/25/2012  CLINICAL INDICATION: Fever  COMPARISON: None.  INTERPRETATION: Frontal and lateral views of the chest obtained. Cardiomediastinal contour within normal limits for age. Clear lungs and sharp sulci.           06/25/2012   No acute cardiopulmonary process.  Mitali  Bapna, MD  06/25/2012 10:43 PM     Ct Sinus Facial Bones Wo Contrast    06/28/2012  HISTORY: Sinusitis  COMPARISON: None.  TECHNIQUE: Axial CT scans slices of the paranasal sinuses and facial bones with sagittal and coronal reformats  FINDINGS: No fracture or other bony abnormality. The sinuses are clear. The ostiomeatal complexes are patent. There is no mass or nasopharynx. There is a left concha bullosa. The nasal septum is midline.      06/28/2012   Normal  Lynnae Prude, MD  06/28/2012 2:42 PM  Ct Chest Wo Contrast    06/28/2012  HISTORY: Cough  COMPARISON: None  TECHNIQUE:  Helical CT scan of the chest was obtained from the apices to the lung bases without intravenous contrast.      FINDINGS:  Lungs and central airways: Atelectasis or scarring in the lower lobes. Pleura: Trace effusions bilaterally. Aorta and Great Vessels: Within normal limits. Heart: No pericardial effusion. No coronary artery  calcification. Mediastinum and hila: No mass or adenopathy Upper Abdomen: No significant abnormality. Bones: No significant abnormality. Chest wall and lower neck: Normal       06/28/2012   Atelectasis or scarring in the lower lobes. Trace bilateral pleural effusions.  Lynnae Prude, MD  06/28/2012 2:47 PM     Nm Cardiac Muga (at Rest)    07/02/2012  CLINICAL INDICATION: Prechemotherapy evaluation. Leukemia.  TECHNIQUE: A dynamic MUGA scan was obtained. Imaging obtained in the LAO, lateral, and anterior projections. The left ventricle region of interest was drawn, on the best separation view, in end-diastole and end-systole, and ejection fraction was calculated. 23.4 mCi of Tc52m labeled autologous RBCs, using the Ultratag technique was administered.  FINDINGS: Normal wall motion is seen in the left ventricle. The ejection fraction is calculated at 79%, which is normal.      07/02/2012   NORMAL EJECTION FRACTION, OF 79%.  Darnelle Maffucci, MD  07/02/2012 2:23 PM     Biopsy, Bone Marrow    06/29/2012  EXAMINATION: Fluoroscopic guided bone marrow aspirate and core biopsy.  INTERVENTIONALIST: Verlee Rossetti, MD.  HISTORY:  Patient is a 57 year old female with pancytopenia and neutropenic fever.  Anesthesia: Moderate sedation with Fentanyl and Versed was administered with appropriate physiologic monitoring using an independent trained observer for 30 minutes. 1% lidocaine local was administered as well.  TECHNIQUE: With the patient in the prone position the skin over the left iliac crest was prepped and draped in usual sterile fashion. Local anesthesia was applied to superficial and deep tissues with 2% lidocaine. Subsequently, under direct fluoroscopic guidance, a bone marrow needle was advanced to the posteromedial aspect of the superior left iliac crest. The outer cortex was traversed and bone marrow aspiration performed and the needle removed.    The bone marrow core needle was then advanced to a separate site on the iliac crest  to obtain a core of bone marrow tissue. The samples were given to hematology for analysis. The patient tolerated procedure well without apparent competition.  INTERPRETATION: Fluoroscopy revealed successful access to the left iliac crest marrow cavity for bone marrow aspiration and for core biopsy.  Fluoroscopy Time: 0.3 min      06/29/2012   Technically successful fluoroscopic guided bone marrow aspirate and core biopsy for pancytopenia.  Larrie Kass, MD  06/29/2012 4:21 PM     Tunneled Cath Placement (permcath)    07/03/2012   Clinical history: Power line  Interventionalist: Dara Lords, MD  Informed consent obtained and all questions answered.  Anesthesia:  Moderate sedation with appropriate monitoring plus local. This consisted of Versed and fentanyl administration with continuous hemodynamic monitoring with a trained observer. Total anesthesia time with direct physician supervision 20 minutes.  Procedure: After standard prepping and draping with maximal sterile barrier technique and local anesthesia with 2% Xylocaine, percutaneous puncture of the right internal jugular vein is performed under ultrasound guidance with [place] image obtained for the medical record. The guidewire is advanced under fluoroscopic guidance. Dilator is advanced over the wire and left in place and  flushed.  A double-lumen power line cuffed catheter is tunneled from the venotomy site to an exit site on the right anterior chest wall after prior infiltration of the right anterior chest wall with Xylocaine plus epinephrine.  The dilator at the venotomy site is exchanged for a peel-away sheath. The double-lumen power line (equivalent to Groshong) catheter is advanced through the peel-away sheath. The peel-away sheath was removed. The catheter length is adjusted to position the catheter tip in the superior vena cava.  The catheter secured in place with 2-0 Prolene suture. The venotomy site was closed with  Dermabond.  Complications: None  apparent.  Fluoroscopy time:0.3 minutes  Findings: The right internal jugular vein is patent by US imaging. An [place] image is obtained for the medical record. Catheter tip is in the superior vena cava.      07/03/2012    successful ultrasound and fluoroscopically guided placement of a right internal jugular dual-lumen cuffed power line catheter. The catheter may be used immediately.  Dara Lords, MD  07/03/2012 2:42 PM                       Larene Pickett, MD  [facility]    [phone]

## 2012-07-11 NOTE — Progress Notes (Signed)
H and H 7.3 and 21.3, pt premedicated and 1 unit of packed red blood cell given, no reaction noted. Pt tolerated the blood transfusion well.

## 2012-07-11 NOTE — Progress Notes (Signed)
Infectious Diseases & Tropical Medicine  Progress Note    07/11/2012   [name] ZOX:[id number],XBJ:[id number] is a 57 y.o. female,       Assessment:     Acute leukemia  Rash   Blood cultures -NGTD  S/P  Groshong catheter placement  MUGA scan-ejection fraction 79%   Lyme antibody test negative  Parvovirus IgM antibody is negative (IgG antibodies-2.0)  Stool studies pending  Indigestion improved  Clinically stable  Remains afebrile  Pancytopenia  Receiving blood transfusion   Clinically stable    Plan:      On chemotherapy   Continue Levaquin   May need Lasix   Chest x-ray in the morning   Monitor liver function tests closely   Continue probiotics   Reviewed notes   D/W patient in detail    ROS:     General: no fever, no chills, no rigor,awake and alert, sitting in the chair  HEENT: no neck pain, no throat pain  Endocrine:c/o fatigue   Respiratory: no cough,c/o mild shortness of breath, or wheezing   Cardiovascular: no chest pain   Gastrointestinal: no abdominal pain,no N/V/D,abdominal bloating improved  Genito-Urinary: no dysuria, trouble voiding, or hematuria   Musculoskeletal: no edema, myalgias, leg swellling  Neurological: c/o generalized weakness  Dermatological: maculopapular rash on the back improved, no ulcer    Physical Examination:     Blood pressure 123/64, pulse 51, temperature 98.6 F (37 C), temperature source Oral, resp. rate 18, height 1.651 m (5\' 5" ), weight 45.36 kg (100 lb), SpO2 96.00%.     General Appearance: Comfortable, and in no acute distress. Awake,alert   HEENT: Pupils are equal, round, and reactive to light.    Lungs:  Few scattered rales   Heart: RRR   Chest: Symmetric chest wall expansion.minimal rash at anterior chest    Abdomen: soft ,non tender,no hepatosplenomegaly   Neurological: No focal deficit   Extremities: No edema    Laboratory And Diagnostic Studies:     Recent Labs   [facility] 07/11/12 0556 07/10/12 0445    WBC 0.49* 0.65*    HGB 7.3* 7.9*    HCT 21.3*  23.9*    PLT 107* 124*     Recent Labs   Basename 07/11/12 0556 07/10/12 0446    NA 140 141    K 4.2 4.2    CL 111* 110*    CO2 27 26    BUN 12.0 13.0    CREAT 0.5* 0.5*    GLU 102* 106*    CA 8.7 8.4*     Recent Labs   [facility] 07/11/12 0556 07/10/12 0446    AST 34 66*    ALT 208* 263*    ALKPHOS 61 66    PROT 4.9* 4.9*    ALB 2.5* 2.6*       Current Meds:      Scheduled Meds: PRN Meds:           [COMPLETED] acetaminophen 650 mg Oral Once   cytarabine (CYTOSAR) continuous infusion 100 mg/m2 (Order-Specific) Intravenous Q24H   dexamethasone 8 mg Oral Daily   Or      dexamethasone 8 mg Intravenous Daily   [COMPLETED] diphenhydrAMINE 25 mg Oral Once   lactobacillus/streoptococcus 1 capsule Oral Daily   levofloxacin 500 mg Intravenous Q24H   pantoprazole 40 mg Oral QAM AC   polyethylene glycol 17 g Oral Daily       Continuous Infusions:       . sodium chloride 50 mL/hr  at 07/11/12 0525   . sodium chloride           sodium chloride 200 mL/hr Continuous PRN   acetaminophen 650 mg Q4H PRN   acetaminophen 650 mg Q4H PRN   ALPRAZolam 0.25 mg QHS PRN   alteplase 2 mg Once PRN   alum & mag hydroxide-simethicone 30 mL Q4H PRN   diphenhydrAMINE 50 mg PRN   diphenhydrAMINE-zinc acetate  TID PRN   EPINEPHrine 0.3 mg PRN   EPINEPHrine 0.1 mg PRN   hydrocortisone 100 mg PRN   lidocaine  PRN   loperamide 4 mg Once PRN   Followed by     [START ON 07/15/2012] loperamide 2 mg PRN   LORazepam 1 mg Q4H PRN   Or     LORazepam 1 mg Q4H PRN   Magic Mouthwash 30 mL Q4H PRN   morphine 2 mg Q2H PRN   naloxone 0.2 mg PRN   senna-docusate 1 tablet Q12H PRN   simethicone 80 mg Q6H PRN   sodium phosphate 1 enema QD PRN         [name] A. Janalyn Rouse, M.D.  07/11/2012  1:08 PM

## 2012-07-11 NOTE — Plan of Care (Signed)
Problem: Hemodynamic Status: Immune Suppressed-Hematologic  Goal: Stable vital signs and fluid balance  Outcome: Progressing  Pt denies any pian. Call bell placed within reach. Cytarabine chemo iv completed, no reaction noted. Cytarabine chemo, the 6 bag started at 42 cc/hr, no reaction noted.Vital sign taken and recorded on the epic. Skin dry and intact. Pt ambulatory to bathroom. Appetite good.

## 2012-07-11 NOTE — Plan of Care (Signed)
Problem: Infection/Potential for Infection  Goal: Free from infection  Outcome: Progressing  Chemo. On progress and neutropenic prec. Measures maintained. Remains afebrile during the shift.will cont. To monitor v/s closely.

## 2012-07-12 LAB — COMPREHENSIVE METABOLIC PANEL
ALT: 226 U/L — ABNORMAL HIGH (ref 0–55)
AST (SGOT): 50 U/L — ABNORMAL HIGH (ref 5–34)
Albumin/Globulin Ratio: 1.1 (ref 0.9–2.2)
Albumin: 2.6 g/dL — ABNORMAL LOW (ref 3.5–5.0)
Alkaline Phosphatase: 63 U/L (ref 40–150)
Anion Gap: 4 — ABNORMAL LOW (ref 5.0–15.0)
BUN: 13 mg/dL (ref 7.0–19.0)
Bilirubin, Total: 0.8 mg/dL (ref 0.2–1.2)
CO2: 27 (ref 22–29)
Calcium: 8.7 mg/dL (ref 8.5–10.5)
Chloride: 109 — ABNORMAL HIGH (ref 98–107)
Creatinine: 0.5 mg/dL — ABNORMAL LOW (ref 0.6–1.0)
Globulin: 2.4 g/dL (ref 2.0–3.6)
Glucose: 105 mg/dL — ABNORMAL HIGH (ref 70–100)
Potassium: 4.3 (ref 3.5–5.1)
Protein, Total: 5 g/dL — ABNORMAL LOW (ref 6.0–8.3)
Sodium: 140 (ref 136–145)

## 2012-07-12 LAB — MAN DIFF ONLY
Band Neutrophils Absolute: 0 (ref 0.00–1.00)
Band Neutrophils: 0 %
Basophils Absolute Manual: 0 (ref 0.00–0.20)
Basophils Manual: 0 %
Eosinophils Absolute Manual: 0 (ref 0.00–0.70)
Eosinophils Manual: 1 %
Lymphocytes Absolute Manual: 0.2 — ABNORMAL LOW (ref 0.50–4.40)
Lymphocytes Manual: 63 %
Monocytes Absolute: 0 (ref 0.00–1.20)
Monocytes Manual: 1 %
Neutrophils Absolute Manual: 0.11 — ABNORMAL LOW (ref 1.80–8.10)
Nucleated RBC: 0 (ref 0–1)
Segmented Neutrophils: 35 %

## 2012-07-12 LAB — CBC AND DIFFERENTIAL
Hematocrit: 25.9 % — ABNORMAL LOW (ref 37.0–47.0)
Hgb: 8.8 g/dL — ABNORMAL LOW (ref 12.0–16.0)
MCH: 32.7 pg — ABNORMAL HIGH (ref 28.0–32.0)
MCHC: 34 g/dL (ref 32.0–36.0)
MCV: 96.3 fL (ref 80.0–100.0)
MPV: 10.4 fL (ref 9.4–12.3)
Platelets: 97 — ABNORMAL LOW (ref 140–400)
RBC: 2.69 — ABNORMAL LOW (ref 4.20–5.40)
RDW: 14 % (ref 12–15)
WBC: 0.32 — ABNORMAL LOW (ref 3.50–10.80)

## 2012-07-12 LAB — GFR: EGFR: >60

## 2012-07-12 LAB — HEMOLYSIS INDEX: Hemolysis Index: 6 (ref 0–18)

## 2012-07-12 LAB — CELL MORPHOLOGY: Cell Morphology: ABNORMAL — AB

## 2012-07-12 MED ORDER — LACTULOSE 10 GM/15ML PO SOLN
20.00 g | Freq: Two times a day (BID) | ORAL | Status: DC | PRN
Start: 2012-07-12 — End: 2012-07-28
  Administered 2012-07-12 – 2012-07-23 (×9): 20 g via ORAL
  Filled 2012-07-12 (×9): qty 30

## 2012-07-12 MED ORDER — FLUCONAZOLE IN SODIUM CHLORIDE 400-0.9 MG/200ML-% IV SOLN
400.00 mg | INTRAVENOUS | Status: DC
Start: 2012-07-12 — End: 2012-07-20
  Administered 2012-07-12 – 2012-07-19 (×8): 400 mg via INTRAVENOUS
  Filled 2012-07-12 (×9): qty 200

## 2012-07-12 MED ORDER — ACYCLOVIR 200 MG PO CAPS
400.0000 mg | ORAL_CAPSULE | Freq: Two times a day (BID) | ORAL | Status: DC
Start: 2012-07-12 — End: 2012-07-28
  Administered 2012-07-12 – 2012-07-28 (×33): 400 mg via ORAL
  Filled 2012-07-12 (×33): qty 2

## 2012-07-12 NOTE — Progress Notes (Signed)
Infectious Diseases & Tropical Medicine  Progress Note    07/12/2012   Elaina Cara ZOX:[id number],XBJ:[id number] is a 57 y.o. female,       Assessment:     Acute leukemia  Rash   Blood cultures -NGTD  S/P  Groshong catheter placement  MUGA scan-ejection fraction 79%   Lyme antibody test negative  Parvovirus IgM antibody is negative (IgG antibodies-2.0)  Stool studies pending  Indigestion improved  Clinically stable  Remains afebrile  Pancytopenia / neutropenia  Elevated LFT's  Clinically stable    Plan:      On chemotherapy   Continue Levaquin   Started on diflucan and acyclovir    Monitor liver function tests closely   Continue probiotics   CXR in a.m   Reviewed notes   D/W patient in detail   D/W Dr Adela Lank    ROS:     General: no fever, no chills, no rigor,awake and alert, sitting in the chair  HEENT: no neck pain, no throat pain  Endocrine:c/o fatigue   Respiratory: no cough,c/o mild shortness of breath, or wheezing   Cardiovascular: no chest pain   Gastrointestinal: no abdominal pain,no N/V/D,abdominal bloating improved,constipation  Genito-Urinary: no dysuria, trouble voiding, or hematuria   Musculoskeletal: no edema, myalgias, leg swellling  Neurological: c/o generalized weakness  Dermatological: maculopapular rash on the back improved, no ulcer    Physical Examination:     Blood pressure 146/75, pulse 44, temperature 97.9 F (36.6 C), temperature source Oral, resp. rate 20, height 1.651 m (5\' 5" ), weight 45.36 kg (100 lb), SpO2 95.00%.     General Appearance: Comfortable, and in no acute distress. Awake,alert,no new issues   HEENT: Pupils are equal, round, and reactive to light.    Lungs:  Few scattered rales   Heart: RRR   Chest: Symmetric chest wall expansion.minimal rash at anterior chest    Abdomen: soft ,non tender,no hepatosplenomegaly   Neurological: No focal deficit   Extremities: No edema    Laboratory And Diagnostic Studies:     Recent Labs   [facility] 07/12/12 0600 07/11/12 0556     WBC 0.32* 0.49*    HGB 8.8* 7.3*    HCT 25.9* 21.3*    PLT 97* 107*     Recent Labs   Basename 07/12/12 0559 07/11/12 0556    NA 140 140    K 4.3 4.2    CL 109* 111*    CO2 27 27    BUN 13.0 12.0    CREAT 0.5* 0.5*    GLU 105* 102*    CA 8.7 8.7     Recent Labs   Basename 07/12/12 0559 07/11/12 0556    AST 50* 34    ALT 226* 208*    ALKPHOS 63 61    PROT 5.0* 4.9*    ALB 2.6* 2.5*       Current Meds:      Scheduled Meds: PRN Meds:           [COMPLETED] acetaminophen 650 mg Oral Once   acyclovir 400 mg Oral Q12H SCH   cytarabine (CYTOSAR) continuous infusion 100 mg/m2 (Order-Specific) Intravenous Q24H   dexamethasone 8 mg Oral Daily   Or      dexamethasone 8 mg Intravenous Daily   [COMPLETED] diphenhydrAMINE 25 mg Oral Once   fluconazole 400 mg Intravenous Q24H [facility]   lactobacillus/streoptococcus 1 capsule Oral Daily   levofloxacin 500 mg Intravenous Q24H   pantoprazole 40 mg Oral QAM AC   polyethylene  glycol 17 g Oral Daily       Continuous Infusions:       . sodium chloride 50 mL/hr at 07/12/12 0424   . sodium chloride           sodium chloride 200 mL/hr Continuous PRN   acetaminophen 650 mg Q4H PRN   acetaminophen 650 mg Q4H PRN   ALPRAZolam 0.25 mg QHS PRN   alteplase 2 mg Once PRN   alum & mag hydroxide-simethicone 30 mL Q4H PRN   diphenhydrAMINE 50 mg PRN   diphenhydrAMINE-zinc acetate  TID PRN   EPINEPHrine 0.3 mg PRN   EPINEPHrine 0.1 mg PRN   hydrocortisone 100 mg PRN   lactulose 20 g BID PRN   lidocaine  PRN   loperamide 4 mg Once PRN   Followed by     [START ON 07/15/2012] loperamide 2 mg PRN   LORazepam 1 mg Q4H PRN   Or     LORazepam 1 mg Q4H PRN   Magic Mouthwash 30 mL Q4H PRN   morphine 2 mg Q2H PRN   naloxone 0.2 mg PRN   senna-docusate 1 tablet Q12H PRN   simethicone 80 mg Q6H PRN   sodium phosphate 1 enema QD PRN         Lorann Tani A. Janalyn Rouse, M.D.  07/12/2012  11:10 AM

## 2012-07-12 NOTE — Plan of Care (Addendum)
Problem: Infection/Potential for Infection  Goal: Free from infection  Outcome: Progressing  Neutropenic prec. Measures maintained.labs. Monitored as well as vital signs.chemo. On progress.no acute distress noted.will cont. To monitor pt. Plan of care and treatments closely.    Addendum;pt. Runs bradycardic and pt. asymptomatic.liyan rn. Made aware and to monitor pt. V/s closely.

## 2012-07-12 NOTE — Progress Notes (Signed)
Daily PROGRESS NOTE    Date Time: 07/12/2012 1:27 PM  Patient Name: [name], [name]  Patient Status: Inpatient  Hospital Day: 17    Assessment:   Acute Leukemia, NOS   Neutropenia  .Pancytopenia.   . Weight loss with a body mass index of 18 shows mild to moderate   Malnutrition  . Skin rash.  Epigastric pain  Sinus brady    Plan:   1. Chemo initiated # 1  2. Remote Tele  3. Neutropenic precaution  4. Prophylactic Levaquin  5. Monitor CBC, lytes  6. Transfuse RBCs, Platelets as needed    Subjective:   Epigastric pain, leg swelling better. Mild SOB  10 point Review of Systems - Negative except for the Positives mentioned above      Medications:     Current Facility-Administered Medications   Medication Dose Route Frequency   . acyclovir  400 mg Oral Q12H SCH   . cytarabine (CYTOSAR) continuous infusion  100 mg/m2 (Order-Specific) Intravenous Q24H   . dexamethasone  8 mg Oral Daily    Or   . dexamethasone  8 mg Intravenous Daily   . fluconazole  400 mg Intravenous Q24H SCH   . lactobacillus/streoptococcus  1 capsule Oral Daily   . levofloxacin  500 mg Intravenous Q24H   . pantoprazole  40 mg Oral QAM AC   . polyethylene glycol  17 g Oral Daily          sodium chloride 200 mL/hr Continuous PRN   acetaminophen 650 mg Q4H PRN   acetaminophen 650 mg Q4H PRN   ALPRAZolam 0.25 mg QHS PRN   alteplase 2 mg Once PRN   alum & mag hydroxide-simethicone 30 mL Q4H PRN   diphenhydrAMINE 50 mg PRN   diphenhydrAMINE-zinc acetate  TID PRN   EPINEPHrine 0.3 mg PRN   EPINEPHrine 0.1 mg PRN   hydrocortisone 100 mg PRN   lactulose 20 g BID PRN   lidocaine  PRN   loperamide 4 mg Once PRN   Followed by     [START ON 07/15/2012] loperamide 2 mg PRN   LORazepam 1 mg Q4H PRN   Or     LORazepam 1 mg Q4H PRN   Magic Mouthwash 30 mL Q4H PRN   morphine 2 mg Q2H PRN   naloxone 0.2 mg PRN   senna-docusate 1 tablet Q12H PRN   simethicone 80 mg Q6H PRN   sodium phosphate 1 enema QD PRN        Physical Exam:     Filed Vitals:    07/12/12 0735   BP:  146/75   Pulse: 44   Temp: 97.9 F (36.6 C)   Resp: 20   SpO2: 95%       Intake and Output Summary (Last 24 hours) at Date Time    Intake/Output Summary (Last 24 hours) at 07/12/12 1327  Last data filed at 07/11/12 1718   Gross per 24 hour   Intake    600 ml   Output      0 ml   Net    600 ml       Physical Exam   Constitutional: She is oriented to person, place, and time. She appears well-developed.   HENT:   Head: Normocephalic and atraumatic.   Eyes: Pupils are equal, round, and reactive to light.   Neck: No JVD present. No tracheal deviation present. No thyromegaly present.   Cardiovascular: Normal rate and normal heart sounds.    Pulmonary/Chest: No  respiratory distress. She has no wheezes. She has no rales.   Abdominal: She exhibits no distension. There is no tenderness. There is no rebound and no guarding.   Musculoskeletal: She exhibits no edema.   Neurological: She is alert and oriented to person, place, and time.   Erythematous Maculopapular rash on anterior chest is better          Labs:     Results     Procedure Component Value Units Date/Time    Cytomegalovirus (CMV) antibody, IgG, EIA [[id number]] Collected:07/12/12 0600    Specimen Information:Blood Updated:07/12/12 0944    Cell MorpHology [[id number]]  (Abnormal) Collected:07/12/12 0600     Cell Morphology: Abnormal (A) Updated:07/12/12 0823     Macrocytic =1+ (A)     Manual Differential [[id number]]  (Abnormal) Collected:07/12/12 0600     Segmented Neutrophils 35 % Updated:07/12/12 0823     Band Neutrophils 0 %      Lymphocytes Manual 63 %      Monocytes Manual 1 %      Eosinophils Manual 1 %      Basophils Manual 0 %      Nucleated RBC 0      Abs Seg Manual 0.11 (L)      Bands Absolute 0.00      Absolute Lymph Manual 0.20 (L)      Monocytes Absolute 0.00      Absolute Eos Manual 0.00      Absolute Baso Manual 0.00     CBC and differential [[id number]]  (Abnormal) Collected:07/12/12 0600    Specimen Information:Blood / Blood Updated:07/12/12 0823      WBC 0.32 (L)      RBC 2.69 (L)      Hgb 8.8 (L) g/dL      Hematocrit 78.2 (L) %      MCV 96.3 fL      MCH 32.7 (H) pg      MCHC 34.0 g/dL      RDW 14 %      Platelets 97 (L)      MPV 10.4 fL     Comprehensive metabolic panel [[id number]]  (Abnormal) Collected:07/12/12 0559    Specimen Information:Blood Updated:07/12/12 0645     Glucose 105 (H) mg/dL      BUN 95.6 mg/dL      Creatinine 0.5 (L) mg/dL      Sodium 213      Potassium 4.3      Chloride 109 (H)      CO2 27      CALCIUM 8.7 mg/dL      Protein, Total 5.0 (L) g/dL      Albumin 2.6 (L) g/dL      AST (SGOT) 50 (H) U/L      ALT 226 (H) U/L      Alkaline Phosphatase 63 U/L      Bilirubin, Total 0.8 mg/dL      Globulin 2.4 g/dL      Albumin/Globulin Ratio 1.1      Anion Gap 4.0 (L)     HEMOLYZED INDEX [[id number]] Collected:07/12/12 0559     Hemolyzed Index 6 Updated:07/12/12 0645    GFR [[id number]] Collected:07/12/12 0559     EGFR >60.0 Updated:07/12/12 0645    Packed Red Blood Cells - Product [[id number]] Collected:07/11/12 1043     RBC Leukoreduced Irradiated [id number]    transfused Updated:07/12/12 0052     RBC Leukoreduced Irradiated [id number]    released  Rads:     Xr Chest 2 Views    06/26/2012   CLINICAL INDICATION: pneumonia  COMPARISON: None available  FINDINGS:   2 views of the chest were obtained. The lungs are clear. The heart and vascularity are within normal limits.  There is no pleural thickening or effusion. The osseous structures are unremarkable.      06/26/2012   No active cardiopulmonary disease   Heron Nay, MD  06/26/2012 11:12 PM     Chest 2 Views    06/25/2012  CLINICAL INDICATION: Fever  COMPARISON: None.  INTERPRETATION: Frontal and lateral views of the chest obtained. Cardiomediastinal contour within normal limits for age. Clear lungs and sharp sulci.           06/25/2012   No acute cardiopulmonary process.  Filbert Schilder, MD  06/25/2012 10:43 PM         Signed by: Eric Form, MD  Pager: [phone]

## 2012-07-12 NOTE — Progress Notes (Addendum)
HEMATOLOGY ONCOLOGY PROGRESS NOTE    Date Time: 07/12/2012 10:44 AM  Patient Name: [name],[name]      IMPRESSION:     Patient Active Problem List   Diagnosis   . Chondromalacia of patella   . Neutropenic fever         PLAN: No change will be made in her current medications.  Transfusion support discussed.  She will require a day 15 bone marrow. Will start acyclovir and diflucan prophylactically.  Although completely asymptomatic her pulse is 44.  She will finish chemotherapy today.  If her pulse drops below 40, the remaining chemotherapy will be held.  An EKG with rhythm strip will be obtained.             HISTORY: [name] is a 57 year old who was in her usual state of excellent health until approximately 5 days prior to admission when she began to experience arthralgias, myalgias, and some rhinorrhea. She was noted to have temperature   elevations to as high as 102 and 103 degrees. 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 Cytogenetics revealed a normal 46XX chromosomal pattern. She is bcr/abl negative and FISH is pending.   She started 7+3 07/06/12. Today is day 7.  Interval History:  She continues to ambulate without difficulty.  ROS remarkable for constipation.         PHYSICAL EXAM:     Filed Vitals:    07/12/12 0735   BP: 146/75   Pulse: 44   Temp: 97.9 F (36.6 C)   Resp: 20   SpO2: 95%       Intake and Output Summary (Last 24 hours) at Date Time    Intake/Output Summary (Last 24 hours) at 07/12/12 1044  Last data filed at 07/11/12 1718   Gross per 24 hour   Intake    600 ml   Output      0 ml   Net    600 ml       Appearance: in no acute distress  HEENT: pharynx clear  Lungs: clear to ausculation  Heart: regular rate and rhythm, normal heart sounds  Abdomen: flat and soft, active bowel sounds  Extremities: no edema  Neuro: alert, appropriate  MEDS:   Scheduled Meds:  Current Facility-Administered Medications   Medication Dose Route Frequency   . [COMPLETED] acetaminophen   650 mg Oral Once   . cytarabine (CYTOSAR) continuous infusion  100 mg/m2 (Order-Specific) Intravenous Q24H   . dexamethasone  8 mg Oral Daily    Or   . dexamethasone  8 mg Intravenous Daily   . [COMPLETED] diphenhydrAMINE  25 mg Oral Once   . lactobacillus/streoptococcus  1 capsule Oral Daily   . levofloxacin  500 mg Intravenous Q24H   . pantoprazole  40 mg Oral QAM AC   . polyethylene glycol  17 g Oral Daily     Continuous Infusions:     . sodium chloride 50 mL/hr at 07/12/12 0424   . sodium chloride       PRN Meds:.sodium chloride, acetaminophen, acetaminophen, ALPRAZolam, alteplase, alum & mag hydroxide-simethicone, diphenhydrAMINE, diphenhydrAMINE-zinc acetate, EPINEPHrine, EPINEPHrine, hydrocortisone, lidocaine, [START ON 07/15/2012] loperamide, loperamide, LORazepam, LORazepam, Magic Mouthwash, morphine, naloxone, senna-docusate, simethicone, sodium phosphate    LABS:     Results     Procedure Component Value Units Date/Time    Cytomegalovirus (CMV) antibody, IgG, EIA [[id number]] Collected:07/12/12 0600    Specimen Information:Blood Updated:07/12/12 0944    Cell MorpHology [[id number]]  (Abnormal)  Collected:07/12/12 0600     Cell Morphology: Abnormal (A) Updated:07/12/12 0823     Macrocytic =1+ (A)     Manual Differential [[id number]]  (Abnormal) Collected:07/12/12 0600     Segmented Neutrophils 35 % Updated:07/12/12 0823     Band Neutrophils 0 %      Lymphocytes Manual 63 %      Monocytes Manual 1 %      Eosinophils Manual 1 %      Basophils Manual 0 %      Nucleated RBC 0      Abs Seg Manual 0.11 (L)      Bands Absolute 0.00      Absolute Lymph Manual 0.20 (L)      Monocytes Absolute 0.00      Absolute Eos Manual 0.00      Absolute Baso Manual 0.00     CBC and differential [[id number]]  (Abnormal) Collected:07/12/12 0600    Specimen Information:Blood / Blood Updated:07/12/12 0823     WBC 0.32 (L)      RBC 2.69 (L)      Hgb 8.8 (L) g/dL      Hematocrit 40.9 (L) %      MCV 96.3 fL      MCH 32.7 (H) pg      MCHC  34.0 g/dL      RDW 14 %      Platelets 97 (L)      MPV 10.4 fL     Comprehensive metabolic panel [[id number]]  (Abnormal) Collected:07/12/12 0559    Specimen Information:Blood Updated:07/12/12 0645     Glucose 105 (H) mg/dL      BUN 81.1 mg/dL      Creatinine 0.5 (L) mg/dL      Sodium 914      Potassium 4.3      Chloride 109 (H)      CO2 27      CALCIUM 8.7 mg/dL      Protein, Total 5.0 (L) g/dL      Albumin 2.6 (L) g/dL      AST (SGOT) 50 (H) U/L      ALT 226 (H) U/L      Alkaline Phosphatase 63 U/L      Bilirubin, Total 0.8 mg/dL      Globulin 2.4 g/dL      Albumin/Globulin Ratio 1.1      Anion Gap 4.0 (L)     HEMOLYZED INDEX [[id number]] Collected:07/12/12 0559     Hemolyzed Index 6 Updated:07/12/12 0645    GFR [[id number]] Collected:07/12/12 0559     EGFR >60.0 Updated:07/12/12 0645    Packed Red Blood Cells - Product [[id number]] Collected:07/11/12 1043     RBC Leukoreduced Irradiated [id number]    transfused Updated:07/12/12 0052     RBC Leukoreduced Irradiated [id number]    released     Type and Screen: if there is no current type and screen in blood bank [id number] Collected:07/11/12 1043    Specimen Information:Blood Updated:07/11/12 1157     ABO Rh O POS      AB Screen Gel NEG     ABO/Rh [[id number]] Collected:07/11/12 0556     ABO Rh O POS Updated:07/11/12 1151          IMAGING DATA:  Xr Chest 2 Views    06/26/2012   CLINICAL INDICATION: pneumonia  COMPARISON: None available  FINDINGS:   2 views of the chest were obtained. The lungs are clear. The heart and vascularity are within normal limits.  There  is no pleural thickening or effusion. The osseous structures are unremarkable.      06/26/2012   No active cardiopulmonary disease   Jessica Nay, MD  06/26/2012 11:12 PM     Chest 2 Views    06/25/2012  CLINICAL INDICATION: Fever  COMPARISON: None.  INTERPRETATION: Frontal and lateral views of the chest obtained. Cardiomediastinal contour within normal limits for age. Clear lungs and sharp sulci.            06/25/2012   No acute cardiopulmonary process.  Jessica  Bapna, MD  06/25/2012 10:43 PM     Ct Sinus Facial Bones Wo Contrast    06/28/2012  HISTORY: Sinusitis  COMPARISON: None.  TECHNIQUE: Axial CT scans slices of the paranasal sinuses and facial bones with sagittal and coronal reformats  FINDINGS: No fracture or other bony abnormality. The sinuses are clear. The ostiomeatal complexes are patent. There is no mass or nasopharynx. There is a left concha bullosa. The nasal septum is midline.      06/28/2012   Normal  Lynnae Prude, MD  06/28/2012 2:42 PM     Ct Chest Wo Contrast    06/28/2012  HISTORY: Cough  COMPARISON: None  TECHNIQUE:  Helical CT scan of the chest was obtained from the apices to the lung bases without intravenous contrast.      FINDINGS:  Lungs and central airways: Atelectasis or scarring in the lower lobes. Pleura: Trace effusions bilaterally. Aorta and Great Vessels: Within normal limits. Heart: No pericardial effusion. No coronary artery calcification. Mediastinum and hila: No mass or adenopathy Upper Abdomen: No significant abnormality. Bones: No significant abnormality. Chest wall and lower neck: Normal       06/28/2012   Atelectasis or scarring in the lower lobes. Trace bilateral pleural effusions.  Lynnae Prude, MD  06/28/2012 2:47 PM     Nm Cardiac Muga (at Rest)    07/02/2012  CLINICAL INDICATION: Prechemotherapy evaluation. Leukemia.  TECHNIQUE: A dynamic MUGA scan was obtained. Imaging obtained in the LAO, lateral, and anterior projections. The left ventricle region of interest was drawn, on the best separation view, in end-diastole and end-systole, and ejection fraction was calculated. 23.4 mCi of Tc50m labeled autologous RBCs, using the Ultratag technique was administered.  FINDINGS: Normal wall motion is seen in the left ventricle. The ejection fraction is calculated at 79%, which is normal.      07/02/2012   NORMAL EJECTION FRACTION, OF 79%.  Darnelle Maffucci, MD  07/02/2012 2:23 PM     Biopsy,  Bone Marrow    06/29/2012  EXAMINATION: Fluoroscopic guided bone marrow aspirate and core biopsy.  INTERVENTIONALIST: Verlee Rossetti, MD.  HISTORY:  Patient is a 57 year old female with pancytopenia and neutropenic fever.  Anesthesia: Moderate sedation with Fentanyl and Versed was administered with appropriate physiologic monitoring using an independent trained observer for 30 minutes. 1% lidocaine local was administered as well.  TECHNIQUE: With the patient in the prone position the skin over the left iliac crest was prepped and draped in usual sterile fashion. Local anesthesia was applied to superficial and deep tissues with 2% lidocaine. Subsequently, under direct fluoroscopic guidance, a bone marrow needle was advanced to the posteromedial aspect of the superior left iliac crest. The outer cortex was traversed and bone marrow aspiration performed and the needle removed.    The bone marrow core needle was then advanced to a separate site on the iliac crest to obtain a core of bone marrow tissue. The  samples were given to hematology for analysis. The patient tolerated procedure well without apparent competition.  INTERPRETATION: Fluoroscopy revealed successful access to the left iliac crest marrow cavity for bone marrow aspiration and for core biopsy.  Fluoroscopy Time: 0.3 min      06/29/2012   Technically successful fluoroscopic guided bone marrow aspirate and core biopsy for pancytopenia.  Larrie Kass, MD  06/29/2012 4:21 PM     Tunneled Cath Placement (permcath)    07/03/2012   Clinical history: Power line  Interventionalist: Dara Lords, MD  Informed consent obtained and all questions answered.  Anesthesia:  Moderate sedation with appropriate monitoring plus local. This consisted of Versed and fentanyl administration with continuous hemodynamic monitoring with a trained observer. Total anesthesia time with direct physician supervision 20 minutes.  Procedure: After standard prepping and draping with maximal  sterile barrier technique and local anesthesia with 2% Xylocaine, percutaneous puncture of the right internal jugular vein is performed under ultrasound guidance with [place] image obtained for the medical record. The guidewire is advanced under fluoroscopic guidance. Dilator is advanced over the wire and left in place and flushed.  A double-lumen power line cuffed catheter is tunneled from the venotomy site to an exit site on the right anterior chest wall after prior infiltration of the right anterior chest wall with Xylocaine plus epinephrine.  The dilator at the venotomy site is exchanged for a peel-away sheath. The double-lumen power line (equivalent to Groshong) catheter is advanced through the peel-away sheath. The peel-away sheath was removed. The catheter length is adjusted to position the catheter tip in the superior vena cava.  The catheter secured in place with 2-0 Prolene suture. The venotomy site was closed with  Dermabond.  Complications: None apparent.  Fluoroscopy time:0.3 minutes  Findings: The right internal jugular vein is patent by US imaging. An [place] image is obtained for the medical record. Catheter tip is in the superior vena cava.      07/03/2012    successful ultrasound and fluoroscopically guided placement of a right internal jugular dual-lumen cuffed power line catheter. The catheter may be used immediately.  Dara Lords, MD  07/03/2012 2:42 PM                       Larene Pickett, MD  [facility]    [phone]

## 2012-07-13 ENCOUNTER — Inpatient Hospital Stay: Payer: 59

## 2012-07-13 LAB — ECG 12-LEAD
Atrial Rate: 67 {beats}/min
P Axis: 73 degrees
P-R Interval: 122 ms
Q-T Interval: 434 ms
QRS Duration: 86 ms
QTC Calculation (Bezet): 458 ms
R Axis: 36 degrees
T Axis: 44 degrees
Ventricular Rate: 67 {beats}/min

## 2012-07-13 LAB — COMPREHENSIVE METABOLIC PANEL
ALT: 230 U/L — ABNORMAL HIGH (ref 0–55)
AST (SGOT): 41 U/L — ABNORMAL HIGH (ref 5–34)
Albumin/Globulin Ratio: 1.2 (ref 0.9–2.2)
Albumin: 2.7 g/dL — ABNORMAL LOW (ref 3.5–5.0)
Alkaline Phosphatase: 58 U/L (ref 40–150)
Anion Gap: 5 (ref 5.0–15.0)
BUN: 13 mg/dL (ref 7.0–19.0)
Bilirubin, Total: 0.8 mg/dL (ref 0.2–1.2)
CO2: 27 (ref 22–29)
Calcium: 8.7 mg/dL (ref 8.5–10.5)
Chloride: 108 — ABNORMAL HIGH (ref 98–107)
Creatinine: 0.5 mg/dL — ABNORMAL LOW (ref 0.6–1.0)
Globulin: 2.3 g/dL (ref 2.0–3.6)
Glucose: 119 mg/dL — ABNORMAL HIGH (ref 70–100)
Potassium: 4.2 (ref 3.5–5.1)
Protein, Total: 5 g/dL — ABNORMAL LOW (ref 6.0–8.3)
Sodium: 140 (ref 136–145)

## 2012-07-13 LAB — CBC AND DIFFERENTIAL
Basophils Absolute Automated: 0 (ref 0.00–0.20)
Basophils Automated: 0 %
Eosinophils Absolute Automated: 0 (ref 0.00–0.70)
Eosinophils Automated: 0 %
Hematocrit: 25 % — ABNORMAL LOW (ref 37.0–47.0)
Hgb: 8.5 g/dL — ABNORMAL LOW (ref 12.0–16.0)
Immature Granulocytes Absolute: 0.02
Immature Granulocytes: 8 %
Lymphocytes Absolute Automated: 0.17 — ABNORMAL LOW (ref 0.50–4.40)
Lymphocytes Automated: 68 %
MCH: 32.8 pg — ABNORMAL HIGH (ref 28.0–32.0)
MCHC: 34 g/dL (ref 32.0–36.0)
MCV: 96.5 fL (ref 80.0–100.0)
MPV: 10.8 fL (ref 9.4–12.3)
Monocytes Absolute Automated: 0 (ref 0.00–1.20)
Monocytes: 0 %
Neutrophils Absolute: 0.08 — ABNORMAL LOW (ref 1.80–8.10)
Neutrophils: 32 %
Nucleated RBC: 0 (ref 0–1)
Platelets: 82 — ABNORMAL LOW (ref 140–400)
RBC: 2.59 — ABNORMAL LOW (ref 4.20–5.40)
RDW: 13 % (ref 12–15)
WBC: 0.25 — ABNORMAL LOW (ref 3.50–10.80)

## 2012-07-13 LAB — HEMOLYSIS INDEX: Hemolysis Index: 7 (ref 0–18)

## 2012-07-13 LAB — GFR: EGFR: >60

## 2012-07-13 LAB — CYTOMEGALOVIRUS ANTIBODY, IGG: CMV AB, IgG: 0.23 (ref <=0.90)

## 2012-07-13 MED ORDER — SODIUM CHLORIDE 0.9 % IV MBP
1.0000 g | Freq: Three times a day (TID) | INTRAVENOUS | Status: DC
Start: 2012-07-13 — End: 2012-07-18
  Administered 2012-07-13 – 2012-07-18 (×16): 1 g via INTRAVENOUS
  Filled 2012-07-13 (×19): qty 1

## 2012-07-13 NOTE — Plan of Care (Signed)
Problem: Infection/Potential for Infection  Goal: Free from infection  Outcome: Progressing  Neutropenic precautions maintained.    Problem: Anxiety  Goal: Anxiety is a manageable level  Outcome: Progressing  Xanax given for anxiety.  Emotional support given.    Comments:   Patient alert and orient x4.  Denies chest pain, c/o mild SOB when asked at the beginning of shift, no respiratory distress observed, oxygen saturation WNL on room air.  Patient on remote tele- running sinus brady, heart rate went below 40 and oncologist made aware, patient asymptomatic.  Chemotherapy on stopped per Dr. Adela Lank orders. Denies nausea, dizziness, fatigue, or chest pain.    Morning labs pending.  Plan for portable chest x-ray today.  Will continue to monitor patient.

## 2012-07-13 NOTE — Progress Notes (Signed)
Daily PROGRESS NOTE    Date Time: 07/13/2012 6:21 PM  Patient Name: [name], [name]  Patient Status: Inpatient  Hospital Day: 18    Assessment:   Acute Leukemia, NOS   Neutropenia  .Pancytopenia.   . Weight loss with a body mass index of 18 shows mild to moderate   Malnutrition  . Skin rash.  Epigastric pain  Sinus brady    Plan:   1. Chemo in progress  2. Remote Tele, Heart rate better  3. Neutropenic precaution  4. Prophylactic Abx per ID  5. Monitor CBC, lytes  6. Transfuse RBCs, Platelets as needed    Subjective:   Marland Kitchen Mild SOB no CP  10 point Review of Systems - Negative except for the Positives mentioned above      Medications:     Current Facility-Administered Medications   Medication Dose Route Frequency   . acyclovir  400 mg Oral Q12H SCH   . ceftAZIDime  1 g Intravenous Q8H   . [COMPLETED] cytarabine (CYTOSAR) continuous infusion  100 mg/m2 (Order-Specific) Intravenous Q24H   . fluconazole  400 mg Intravenous Q24H SCH   . lactobacillus/streoptococcus  1 capsule Oral Daily   . pantoprazole  40 mg Oral QAM AC   . polyethylene glycol  17 g Oral Daily   . [DISCONTINUED] levofloxacin  500 mg Intravenous Q24H          sodium chloride 200 mL/hr Continuous PRN   acetaminophen 650 mg Q4H PRN   acetaminophen 650 mg Q4H PRN   ALPRAZolam 0.25 mg QHS PRN   alteplase 2 mg Once PRN   alum & mag hydroxide-simethicone 30 mL Q4H PRN   diphenhydrAMINE 50 mg PRN   diphenhydrAMINE-zinc acetate  TID PRN   EPINEPHrine 0.3 mg PRN   EPINEPHrine 0.1 mg PRN   hydrocortisone 100 mg PRN   lactulose 20 g BID PRN   lidocaine  PRN   loperamide 4 mg Once PRN   Followed by     [START ON 07/15/2012] loperamide 2 mg PRN   LORazepam 1 mg Q4H PRN   Or     LORazepam 1 mg Q4H PRN   Magic Mouthwash 30 mL Q4H PRN   morphine 2 mg Q2H PRN   naloxone 0.2 mg PRN   senna-docusate 1 tablet Q12H PRN   simethicone 80 mg Q6H PRN   sodium phosphate 1 enema QD PRN        Physical Exam:     Filed Vitals:    07/13/12 1802   BP: 131/69   Pulse: 67   Temp:  97.1 F (36.2 C)   Resp: 67   SpO2: 97%       Intake and Output Summary (Last 24 hours) at Date Time    Intake/Output Summary (Last 24 hours) at 07/13/12 1821  Last data filed at 07/13/12 0620   Gross per 24 hour   Intake   1110 ml   Output      0 ml   Net   1110 ml       Physical Exam   Constitutional: She is oriented to person, place, and time. She appears well-developed.   HENT:   Head: Normocephalic and atraumatic.   Eyes: Pupils are equal, round, and reactive to light.   Neck: No JVD present. No tracheal deviation present. No thyromegaly present.   Cardiovascular: Normal rate and normal heart sounds.    Pulmonary/Chest: No respiratory distress. She has no wheezes. She has no rales.  Abdominal: She exhibits no distension. There is no tenderness. There is no rebound and no guarding.   Musculoskeletal: She exhibits no edema.   Neurological: She is alert and oriented to person, place, and time.   Erythematous Maculopapular rash on anterior chest is better          Labs:     Results     Procedure Component Value Units Date/Time    Cytomegalovirus (CMV) antibody, IgG, EIA [[id number]] Collected:07/12/12 0600    Specimen Information:Blood Updated:07/13/12 1204     CMV AB, IgG 0.23     CBC and differential [[id number]]  (Abnormal) Collected:07/13/12 0405    Specimen Information:Blood / Blood Updated:07/13/12 0522     WBC 0.25 (L)      RBC 2.59 (L)      Hgb 8.5 (L) g/dL      Hematocrit 40.9 (L) %      MCV 96.5 fL      MCH 32.8 (H) pg      MCHC 34.0 g/dL      RDW 13 %      Platelets 82 (L)      MPV 10.8 fL      Neutrophils 32 %      Lymphocytes Automated 68 %      Monocytes 0 %      Eosinophils Automated 0 %      Basophils Automated 0 %      Immature Granulocyte 8 %      Nucleated RBC 0      Neutrophils Absolute 0.08 (L)      Abs Lymph Automated 0.17 (L)      Abs Mono Automated 0.00      Abs Eos Automated 0.00      Absolute Baso Automated 0.00      Absolute Immature Granulocyte 0.02     Comprehensive metabolic panel  [[id number]]  (Abnormal) Collected:07/13/12 0405    Specimen Information:Blood Updated:07/13/12 0444     Glucose 119 (H) mg/dL      BUN 95.6 mg/dL      Creatinine 0.5 (L) mg/dL      Sodium 213      Potassium 4.2      Chloride 108 (H)      CO2 27      CALCIUM 8.7 mg/dL      Protein, Total 5.0 (L) g/dL      Albumin 2.7 (L) g/dL      AST (SGOT) 41 (H) U/L      ALT 230 (H) U/L      Alkaline Phosphatase 58 U/L      Bilirubin, Total 0.8 mg/dL      Globulin 2.3 g/dL      Albumin/Globulin Ratio 1.2      Anion Gap 5.0     HEMOLYZED INDEX [[id number]] Collected:07/13/12 0405     Hemolyzed Index 7 Updated:07/13/12 0444    GFR [[id number]] Collected:07/13/12 0405     EGFR >60.0 Updated:07/13/12 0444            Rads:     Xr Chest 2 Views    06/26/2012   CLINICAL INDICATION: pneumonia  COMPARISON: None available  FINDINGS:   2 views of the chest were obtained. The lungs are clear. The heart and vascularity are within normal limits.  There is no pleural thickening or effusion. The osseous structures are unremarkable.      06/26/2012   No active cardiopulmonary disease   Heron Nay, MD  06/26/2012 11:12 PM  Chest 2 Views    06/25/2012  CLINICAL INDICATION: Fever  COMPARISON: None.  INTERPRETATION: Frontal and lateral views of the chest obtained. Cardiomediastinal contour within normal limits for age. Clear lungs and sharp sulci.           06/25/2012   No acute cardiopulmonary process.  Filbert Schilder, MD  06/25/2012 10:43 PM         Signed by: Eric Form, MD  Pager: [phone]

## 2012-07-13 NOTE — Plan of Care (Signed)
Problem: Hemodynamic Status: Immune Suppressed-Hematologic  Goal: Stable vital signs and fluid balance  Outcome: Progressing  Chemo cytarabine restarted at 1130 per dr Bing Matter, no reaction noted. HR goes up to 60-70 at the time of chemo restart. Appetite good. Pt ambulatory. Denies any pain at this time.

## 2012-07-13 NOTE — Progress Notes (Signed)
Infectious Diseases & Tropical Medicine  Progress Note    07/13/2012   [name] ZOX:[id number]:[id number] is a 57 y.o. female,       Assessment:     Acute leukemia undergoing chemotherapy  Rash resolved   Blood cultures -NGTD  S/P  Groshong catheter placement  MUGA scan-ejection fraction 79%   Lyme antibody test negative  Parvovirus IgM antibody is negative (IgG antibodies-2.0)  Remains afebrile  Pancytopenia / neutropenia  Elevated LFT's improving  CXR -small bilateral pleural efffusions  Clinically stable    Plan:      Chemotherapy held due to bradycardia   Discontinue Levaquin   Start ceftazidime   Continue diflucan and acyclovir    Monitor liver function tests closely   Continue probiotics   Reviewed notes   D/W patient in detail   D/W pharmacy    ROS:     General: no fever, no chills, no rigor,awake and alert, sitting in the chair  HEENT: no neck pain, no throat pain  Endocrine:c/o fatigue   Respiratory: no cough,c/o shortness of breath, or wheezing   Cardiovascular: no chest pain   Gastrointestinal: no abdominal pain,no N/V/D,abdominal bloating resolved,still constipated  Genito-Urinary: no dysuria, trouble voiding, or hematuria   Musculoskeletal: no edema, myalgias, leg swellling  Neurological: c/o generalized weakness  Dermatological: maculopapular rash on the back improved, no ulcer    Physical Examination:     Blood pressure 151/80, pulse 39, temperature 97.3 F (36.3 C), temperature source Oral, resp. rate 20, height 1.651 m (5\' 5" ), weight 45.36 kg (100 lb), SpO2 97.00%.     General Appearance: Comfortable, and in no acute distress. Awake,alert   HEENT: Pupils are equal, round, and reactive to light.    Lungs:  Bilateral crackles   Heart: bradycardia   Chest: Symmetric chest wall expansion.minimal rash at anterior chest    Abdomen: soft ,non tender,no hepatosplenomegaly   Neurological: No focal deficit   Extremities: No edema    Laboratory And Diagnostic Studies:     Recent Labs    [facility] 07/13/12 0405 07/12/12 0600    WBC 0.25* 0.32*    HGB 8.5* 8.8*    HCT 25.0* 25.9*    PLT 82* 97*     Recent Labs   Basename 07/13/12 0405 07/12/12 0559    NA 140 140    K 4.2 4.3    CL 108* 109*    CO2 27 27    BUN 13.0 13.0    CREAT 0.5* 0.5*    GLU 119* 105*    CA 8.7 8.7     Recent Labs   Basename 07/13/12 0405 07/12/12 0559    AST 41* 50*    ALT 230* 226*    ALKPHOS 58 63    PROT 5.0* 5.0*    ALB 2.7* 2.6*       Current Meds:      Scheduled Meds: PRN Meds:           acyclovir 400 mg Oral Q12H SCH   cytarabine (CYTOSAR) continuous infusion 100 mg/m2 (Order-Specific) Intravenous Q24H   [COMPLETED] dexamethasone 8 mg Oral Daily   Or      [COMPLETED] dexamethasone 8 mg Intravenous Daily   fluconazole 400 mg Intravenous Q24H [facility]   lactobacillus/streoptococcus 1 capsule Oral Daily   levofloxacin 500 mg Intravenous Q24H   pantoprazole 40 mg Oral QAM AC   polyethylene glycol 17 g Oral Daily       Continuous Infusions:       .  sodium chloride 50 mL/hr at 07/12/12 0424   . sodium chloride           sodium chloride 200 mL/hr Continuous PRN   acetaminophen 650 mg Q4H PRN   acetaminophen 650 mg Q4H PRN   ALPRAZolam 0.25 mg QHS PRN   alteplase 2 mg Once PRN   alum & mag hydroxide-simethicone 30 mL Q4H PRN   diphenhydrAMINE 50 mg PRN   diphenhydrAMINE-zinc acetate  TID PRN   EPINEPHrine 0.3 mg PRN   EPINEPHrine 0.1 mg PRN   hydrocortisone 100 mg PRN   lactulose 20 g BID PRN   lidocaine  PRN   loperamide 4 mg Once PRN   Followed by     [START ON 07/15/2012] loperamide 2 mg PRN   LORazepam 1 mg Q4H PRN   Or     LORazepam 1 mg Q4H PRN   Magic Mouthwash 30 mL Q4H PRN   morphine 2 mg Q2H PRN   naloxone 0.2 mg PRN   senna-docusate 1 tablet Q12H PRN   simethicone 80 mg Q6H PRN   sodium phosphate 1 enema QD PRN         Nahjae Hoeg A. Janalyn Rouse, M.D.  07/13/2012  8:53 AM

## 2012-07-13 NOTE — Progress Notes (Signed)
HEMATOLOGY ONCOLOGY PROGRESS NOTE    Date Time: 07/13/2012 9:41 AM  Patient Name: [name],[name]      IMPRESSION:     Patient Active Problem List   Diagnosis   . Chondromalacia of patella   . Neutropenic fever         PLAN: No change will be made in her current medications.  Bradycardia but completely asymptomatic.  Will complete the infusion today.  However, if any symptoms arise, will discontinue the rest of the infusion.             HISTORY: [name] is a 57 year old who was in her usual state of excellent health until approximately 5 days prior to admission when she began to experience arthralgias, myalgias, and some rhinorrhea. She was noted to have temperature   elevations to as high as 102 and 103 degrees. 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 Cytogenetics revealed a normal 46XX chromosomal pattern. She is bcr/abl negative and FISH is pending.   She started 7+3 07/06/12. Today is day 7.  Interval History:  She continues to ambulate without difficulty.  ROS remarkable for constipation.         PHYSICAL EXAM:     Filed Vitals:    07/13/12 0633   BP: 151/80   Pulse: 39   Temp: 97.3 F (36.3 C)   Resp: 20   SpO2: 97%       Intake and Output Summary (Last 24 hours) at Date Time    Intake/Output Summary (Last 24 hours) at 07/13/12 0941  Last data filed at 07/13/12 0620   Gross per 24 hour   Intake   1110 ml   Output      0 ml   Net   1110 ml       Appearance: in no acute distress  HEENT: pharynx clear  Lungs: clear to ausculation  Heart: regular rate and rhythm, normal heart sounds  Abdomen: flat and soft, active bowel sounds  Extremities: no edema  Neuro: alert, appropriate  MEDS:   Scheduled Meds:  Current Facility-Administered Medications   Medication Dose Route Frequency   . acyclovir  400 mg Oral Q12H SCH   . cytarabine (CYTOSAR) continuous infusion  100 mg/m2 (Order-Specific) Intravenous Q24H   . [COMPLETED] dexamethasone  8 mg Oral Daily    Or   . [COMPLETED] dexamethasone   8 mg Intravenous Daily   . fluconazole  400 mg Intravenous Q24H SCH   . lactobacillus/streoptococcus  1 capsule Oral Daily   . levofloxacin  500 mg Intravenous Q24H   . pantoprazole  40 mg Oral QAM AC   . polyethylene glycol  17 g Oral Daily     Continuous Infusions:       . sodium chloride 50 mL/hr at 07/12/12 0424   . sodium chloride       PRN Meds:.sodium chloride, acetaminophen, acetaminophen, ALPRAZolam, alteplase, alum & mag hydroxide-simethicone, diphenhydrAMINE, diphenhydrAMINE-zinc acetate, EPINEPHrine, EPINEPHrine, hydrocortisone, lactulose, lidocaine, [START ON 07/15/2012] loperamide, loperamide, LORazepam, LORazepam, Magic Mouthwash, morphine, naloxone, senna-docusate, simethicone, sodium phosphate    LABS:     Results     Procedure Component Value Units Date/Time    CBC and differential [[id number]]  (Abnormal) Collected:07/13/12 0405    Specimen Information:Blood / Blood Updated:07/13/12 0522     WBC 0.25 (L)      RBC 2.59 (L)      Hgb 8.5 (L) g/dL      Hematocrit 40.9 (L) %  MCV 96.5 fL      MCH 32.8 (H) pg      MCHC 34.0 g/dL      RDW 13 %      Platelets 82 (L)      MPV 10.8 fL      Neutrophils 32 %      Lymphocytes Automated 68 %      Monocytes 0 %      Eosinophils Automated 0 %      Basophils Automated 0 %      Immature Granulocyte 8 %      Nucleated RBC 0      Neutrophils Absolute 0.08 (L)      Abs Lymph Automated 0.17 (L)      Abs Mono Automated 0.00      Abs Eos Automated 0.00      Absolute Baso Automated 0.00      Absolute Immature Granulocyte 0.02     Comprehensive metabolic panel [[id number]]  (Abnormal) Collected:07/13/12 0405    Specimen Information:Blood Updated:07/13/12 0444     Glucose 119 (H) mg/dL      BUN 40.9 mg/dL      Creatinine 0.5 (L) mg/dL      Sodium 811      Potassium 4.2      Chloride 108 (H)      CO2 27      CALCIUM 8.7 mg/dL      Protein, Total 5.0 (L) g/dL      Albumin 2.7 (L) g/dL      AST (SGOT) 41 (H) U/L      ALT 230 (H) U/L      Alkaline Phosphatase 58 U/L       Bilirubin, Total 0.8 mg/dL      Globulin 2.3 g/dL      Albumin/Globulin Ratio 1.2      Anion Gap 5.0     HEMOLYZED INDEX [[id number]] Collected:07/13/12 0405     Hemolyzed Index 7 Updated:07/13/12 0444    GFR [[id number]] Collected:07/13/12 0405     EGFR >60.0 Updated:07/13/12 0444    Cytomegalovirus (CMV) antibody, IgG, EIA [[id number]] Collected:07/12/12 0600    Specimen Information:Blood Updated:07/12/12 0944          IMAGING DATA:  Xr Chest 2 Views    06/26/2012   CLINICAL INDICATION: pneumonia  COMPARISON: None available  FINDINGS:   2 views of the chest were obtained. The lungs are clear. The heart and vascularity are within normal limits.  There is no pleural thickening or effusion. The osseous structures are unremarkable.      06/26/2012   No active cardiopulmonary disease   Heron Nay, MD  06/26/2012 11:12 PM     Chest 2 Views    06/25/2012  CLINICAL INDICATION: Fever  COMPARISON: None.  INTERPRETATION: Frontal and lateral views of the chest obtained. Cardiomediastinal contour within normal limits for age. Clear lungs and sharp sulci.           06/25/2012   No acute cardiopulmonary process.  Mitali  Bapna, MD  06/25/2012 10:43 PM     Ct Sinus Facial Bones Wo Contrast    06/28/2012  HISTORY: Sinusitis  COMPARISON: None.  TECHNIQUE: Axial CT scans slices of the paranasal sinuses and facial bones with sagittal and coronal reformats  FINDINGS: No fracture or other bony abnormality. The sinuses are clear. The ostiomeatal complexes are patent. There is no mass or nasopharynx. There is a left concha bullosa. The nasal septum is midline.  06/28/2012   Normal  Lynnae Prude, MD  06/28/2012 2:42 PM     Ct Chest Wo Contrast    06/28/2012  HISTORY: Cough  COMPARISON: None  TECHNIQUE:  Helical CT scan of the chest was obtained from the apices to the lung bases without intravenous contrast.      FINDINGS:  Lungs and central airways: Atelectasis or scarring in the lower lobes. Pleura: Trace effusions bilaterally. Aorta and  Great Vessels: Within normal limits. Heart: No pericardial effusion. No coronary artery calcification. Mediastinum and hila: No mass or adenopathy Upper Abdomen: No significant abnormality. Bones: No significant abnormality. Chest wall and lower neck: Normal       06/28/2012   Atelectasis or scarring in the lower lobes. Trace bilateral pleural effusions.  Lynnae Prude, MD  06/28/2012 2:47 PM     Nm Cardiac Muga (at Rest)    07/02/2012  CLINICAL INDICATION: Prechemotherapy evaluation. Leukemia.  TECHNIQUE: A dynamic MUGA scan was obtained. Imaging obtained in the LAO, lateral, and anterior projections. The left ventricle region of interest was drawn, on the best separation view, in end-diastole and end-systole, and ejection fraction was calculated. 23.4 mCi of Tc31m labeled autologous RBCs, using the Ultratag technique was administered.  FINDINGS: Normal wall motion is seen in the left ventricle. The ejection fraction is calculated at 79%, which is normal.      07/02/2012   NORMAL EJECTION FRACTION, OF 79%.  Darnelle Maffucci, MD  07/02/2012 2:23 PM     Biopsy, Bone Marrow    06/29/2012  EXAMINATION: Fluoroscopic guided bone marrow aspirate and core biopsy.  INTERVENTIONALIST: Verlee Rossetti, MD.  HISTORY:  Patient is a 57 year old female with pancytopenia and neutropenic fever.  Anesthesia: Moderate sedation with Fentanyl and Versed was administered with appropriate physiologic monitoring using an independent trained observer for 30 minutes. 1% lidocaine local was administered as well.  TECHNIQUE: With the patient in the prone position the skin over the left iliac crest was prepped and draped in usual sterile fashion. Local anesthesia was applied to superficial and deep tissues with 2% lidocaine. Subsequently, under direct fluoroscopic guidance, a bone marrow needle was advanced to the posteromedial aspect of the superior left iliac crest. The outer cortex was traversed and bone marrow aspiration performed and the needle  removed.    The bone marrow core needle was then advanced to a separate site on the iliac crest to obtain a core of bone marrow tissue. The samples were given to hematology for analysis. The patient tolerated procedure well without apparent competition.  INTERPRETATION: Fluoroscopy revealed successful access to the left iliac crest marrow cavity for bone marrow aspiration and for core biopsy.  Fluoroscopy Time: 0.3 min      06/29/2012   Technically successful fluoroscopic guided bone marrow aspirate and core biopsy for pancytopenia.  Larrie Kass, MD  06/29/2012 4:21 PM     Tunneled Cath Placement (permcath)    07/03/2012   Clinical history: Power line  Interventionalist: Dara Lords, MD  Informed consent obtained and all questions answered.  Anesthesia:  Moderate sedation with appropriate monitoring plus local. This consisted of Versed and fentanyl administration with continuous hemodynamic monitoring with a trained observer. Total anesthesia time with direct physician supervision 20 minutes.  Procedure: After standard prepping and draping with maximal sterile barrier technique and local anesthesia with 2% Xylocaine, percutaneous puncture of the right internal jugular vein is performed under ultrasound guidance with [place] image obtained for the medical record. The  guidewire is advanced under fluoroscopic guidance. Dilator is advanced over the wire and left in place and flushed.  A double-lumen power line cuffed catheter is tunneled from the venotomy site to an exit site on the right anterior chest wall after prior infiltration of the right anterior chest wall with Xylocaine plus epinephrine.  The dilator at the venotomy site is exchanged for a peel-away sheath. The double-lumen power line (equivalent to Groshong) catheter is advanced through the peel-away sheath. The peel-away sheath was removed. The catheter length is adjusted to position the catheter tip in the superior vena cava.  The catheter secured in place  with 2-0 Prolene suture. The venotomy site was closed with  Dermabond.  Complications: None apparent.  Fluoroscopy time:0.3 minutes  Findings: The right internal jugular vein is patent by US imaging. An [place] image is obtained for the medical record. Catheter tip is in the superior vena cava.      07/03/2012    successful ultrasound and fluoroscopically guided placement of a right internal jugular dual-lumen cuffed power line catheter. The catheter may be used immediately.  Dara Lords, MD  07/03/2012 2:42 PM                       Gunnar Fusi, MD  [facility]    [phone]

## 2012-07-13 NOTE — Plan of Care (Signed)
Patient heart rate in 37-40's, patient asymptomatic.  Denies chest pain/SOB.  Patient resting, no distress observed.  Vitals per dinamap:  B/P 147/86 P: 54 Temp 98.0 and 98% on room air.    ON-call oncologist notified of the above and ordered to hold chemotherapy until patient is assessed by MD in the morning.  No need to be transferred to a Tele unit.    Will continue to monitor patient.

## 2012-07-14 LAB — MAN DIFF ONLY
Band Neutrophils Absolute: 0 (ref 0.00–1.00)
Band Neutrophils: 0 %
Basophils Absolute Manual: 0 (ref 0.00–0.20)
Basophils Manual: 0 %
Eosinophils Absolute Manual: 0 (ref 0.00–0.70)
Eosinophils Manual: 0 %
Lymphocytes Absolute Manual: 0.48 — ABNORMAL LOW (ref 0.50–4.40)
Lymphocytes Manual: 95 %
Monocytes Absolute: 0 (ref 0.00–1.20)
Monocytes Manual: 0 %
Neutrophils Absolute Manual: 0.03 — ABNORMAL LOW (ref 1.80–8.10)
Nucleated RBC: 0 (ref 0–1)
Segmented Neutrophils: 5 %

## 2012-07-14 LAB — GFR: EGFR: >60

## 2012-07-14 LAB — CBC AND DIFFERENTIAL
Hematocrit: 24.8 % — ABNORMAL LOW (ref 37.0–47.0)
Hgb: 8.5 g/dL — ABNORMAL LOW (ref 12.0–16.0)
MCH: 33.1 pg — ABNORMAL HIGH (ref 28.0–32.0)
MCHC: 34.3 g/dL (ref 32.0–36.0)
MCV: 96.5 fL (ref 80.0–100.0)
MPV: 10.5 fL (ref 9.4–12.3)
Platelets: 68 — ABNORMAL LOW (ref 140–400)
RBC: 2.57 — ABNORMAL LOW (ref 4.20–5.40)
RDW: 13 % (ref 12–15)
WBC: 0.5 — ABNORMAL LOW (ref 3.50–10.80)

## 2012-07-14 LAB — HEMOLYSIS INDEX: Hemolysis Index: 6 (ref 0–18)

## 2012-07-14 LAB — COMPREHENSIVE METABOLIC PANEL
ALT: 174 U/L — ABNORMAL HIGH (ref 0–55)
AST (SGOT): 18 U/L (ref 5–34)
Albumin/Globulin Ratio: 1.1 (ref 0.9–2.2)
Albumin: 2.6 g/dL — ABNORMAL LOW (ref 3.5–5.0)
Alkaline Phosphatase: 55 U/L (ref 40–150)
Anion Gap: 4 — ABNORMAL LOW (ref 5.0–15.0)
BUN: 13 mg/dL (ref 7.0–19.0)
Bilirubin, Total: 0.7 mg/dL (ref 0.2–1.2)
CO2: 28 (ref 22–29)
Calcium: 8.7 mg/dL (ref 8.5–10.5)
Chloride: 108 — ABNORMAL HIGH (ref 98–107)
Creatinine: 0.5 mg/dL — ABNORMAL LOW (ref 0.6–1.0)
Globulin: 2.3 g/dL (ref 2.0–3.6)
Glucose: 89 mg/dL (ref 70–100)
Potassium: 4.1 (ref 3.5–5.1)
Protein, Total: 4.9 g/dL — ABNORMAL LOW (ref 6.0–8.3)
Sodium: 140 (ref 136–145)

## 2012-07-14 LAB — CELL MORPHOLOGY: Cell Morphology: ABNORMAL — AB

## 2012-07-14 NOTE — Progress Notes (Signed)
Daily PROGRESS NOTE    Date Time: 07/14/2012 5:16 PM  Patient Name: [name], [name]  Patient Status: Inpatient  Hospital Day: 19    Assessment:   Acute Leukemia, NOS   Neutropenia  .Pancytopenia.   . Weight loss with a body mass index of 18 shows mild to moderate   Malnutrition  . Skin rash.  Epigastric pain  Sinus brady    Plan:   1. Chemo in progress  2. Remote Tele, Heart rate better  3. Neutropenic precaution  4. Prophylactic Abx per ID  5. Monitor CBC, lytes  6. Transfuse RBCs, Platelets as needed    Subjective:   Marland Kitchen Mild SOB, Fatigue  10 point Review of Systems - Negative except for the Positives mentioned above      Medications:     Current Facility-Administered Medications   Medication Dose Route Frequency   . acyclovir  400 mg Oral Q12H SCH   . ceftAZIDime  1 g Intravenous Q8H   . fluconazole  400 mg Intravenous Q24H SCH   . lactobacillus/streoptococcus  1 capsule Oral Daily   . pantoprazole  40 mg Oral QAM AC   . polyethylene glycol  17 g Oral Daily          sodium chloride 200 mL/hr Continuous PRN   acetaminophen 650 mg Q4H PRN   acetaminophen 650 mg Q4H PRN   ALPRAZolam 0.25 mg QHS PRN   alteplase 2 mg Once PRN   alum & mag hydroxide-simethicone 30 mL Q4H PRN   diphenhydrAMINE 50 mg PRN   diphenhydrAMINE-zinc acetate  TID PRN   EPINEPHrine 0.3 mg PRN   EPINEPHrine 0.1 mg PRN   hydrocortisone 100 mg PRN   lactulose 20 g BID PRN   lidocaine  PRN   loperamide 4 mg Once PRN   Followed by     [START ON 07/15/2012] loperamide 2 mg PRN   LORazepam 1 mg Q4H PRN   Or     LORazepam 1 mg Q4H PRN   Magic Mouthwash 30 mL Q4H PRN   morphine 2 mg Q2H PRN   naloxone 0.2 mg PRN   senna-docusate 1 tablet Q12H PRN   simethicone 80 mg Q6H PRN   sodium phosphate 1 enema QD PRN        Physical Exam:     Filed Vitals:    07/14/12 0824   BP: 119/71   Pulse: 56   Temp: 97.9 F (36.6 C)   Resp: 20   SpO2: 97%       Intake and Output Summary (Last 24 hours) at Date Time    Intake/Output Summary (Last 24 hours) at 07/14/12  1716  Last data filed at 07/14/12 0600   Gross per 24 hour   Intake    600 ml   Output      0 ml   Net    600 ml       Physical Exam   Constitutional: She is oriented to person, place, and time. She appears well-developed.   HENT:   Head: Normocephalic and atraumatic.   Eyes: Pupils are equal, round, and reactive to light.   Neck: No JVD present. No tracheal deviation present. No thyromegaly present.   Cardiovascular: Normal rate and normal heart sounds.    Pulmonary/Chest: No respiratory distress. She has no wheezes. She has no rales.   Abdominal: She exhibits no distension. There is no tenderness. There is no rebound and no guarding.   Musculoskeletal: She exhibits no  edema.   Neurological: She is alert and oriented to person, place, and time.           Labs:     Results     Procedure Component Value Units Date/Time    Cell MorpHology [[id number]]  (Abnormal) Collected:07/14/12 0420     Cell Morphology: Abnormal (A) Updated:07/14/12 0718     Anisocytosis =1+ (A)     CBC and differential [[id number]]  (Abnormal) Collected:07/14/12 0420    Specimen Information:Blood / Blood Updated:07/14/12 0718     WBC 0.50 (L)      RBC 2.57 (L)      Hgb 8.5 (L) g/dL      Hematocrit 08.6 (L) %      MCV 96.5 fL      MCH 33.1 (H) pg      MCHC 34.3 g/dL      RDW 13 %      Platelets 68 (L)      MPV 10.5 fL     Manual Differential [[id number]]  (Abnormal) Collected:07/14/12 0420     Segmented Neutrophils 5 % Updated:07/14/12 0718     Band Neutrophils 0 %      Lymphocytes Manual 95 %      Monocytes Manual 0 %      Eosinophils Manual 0 %      Basophils Manual 0 %      Nucleated RBC 0      Abs Seg Manual 0.03 (L)      Bands Absolute 0.00      Absolute Lymph Manual 0.48 (L)      Monocytes Absolute 0.00      Absolute Eos Manual 0.00      Absolute Baso Manual 0.00     Comprehensive metabolic panel [[id number]]  (Abnormal) Collected:07/14/12 0420    Specimen Information:Blood Updated:07/14/12 0542     Glucose 89 mg/dL      BUN 01.0 mg/dL       Creatinine 0.5 (L) mg/dL      Sodium 272      Potassium 4.1      Chloride 108 (H)      CO2 28      CALCIUM 8.7 mg/dL      Protein, Total 4.9 (L) g/dL      Albumin 2.6 (L) g/dL      AST (SGOT) 18 U/L      ALT 174 (H) U/L      Alkaline Phosphatase 55 U/L      Bilirubin, Total 0.7 mg/dL      Globulin 2.3 g/dL      Albumin/Globulin Ratio 1.1      Anion Gap 4.0 (L)     HEMOLYZED INDEX [[id number]] Collected:07/14/12 0420     Hemolyzed Index 6 Updated:07/14/12 0542    GFR [[id number]] Collected:07/14/12 0420     EGFR >60.0 Updated:07/14/12 0542            Rads:     Xr Chest 2 Views    06/26/2012   CLINICAL INDICATION: pneumonia  COMPARISON: None available  FINDINGS:   2 views of the chest were obtained. The lungs are clear. The heart and vascularity are within normal limits.  There is no pleural thickening or effusion. The osseous structures are unremarkable.      06/26/2012   No active cardiopulmonary disease   Heron Nay, MD  06/26/2012 11:12 PM     Chest 2 Views    06/25/2012  CLINICAL INDICATION: Fever  COMPARISON: None.  INTERPRETATION: Frontal and lateral views of the chest obtained. Cardiomediastinal contour within normal limits for age. Clear lungs and sharp sulci.           06/25/2012   No acute cardiopulmonary process.  Filbert Schilder, MD  06/25/2012 10:43 PM         Signed by: Eric Form, MD  Pager: [phone]

## 2012-07-14 NOTE — Progress Notes (Signed)
Infectious Diseases [facility] T[facility]  Progress Note    07/14/2012   [name] VZD:[id number] is a 57 y.o. female,       Assessment:     Acute leukemia - completed first chemotherapy  Rash resolved   Blood cultures -NGTD  S/P  Groshong catheter placement  MUGA scan-ejection fraction 79%   Lyme antibody test negative  Parvovirus IgM antibody is negative (IgG antibodies-2.0)  Remains afebrile  Pancytopenia / neutropenia  Elevated LFT's further  improved  CXR -small bilateral pleural efffusions  Clinically stable  Bradycardia improving    Plan:      Continue ceftazidime   Continue diflucan and acyclovir    Monitor CBC and liver function tests closely   Continue probiotics   Reviewed notes   D/W patient in detail    ROS:     General: no fever, no chills, no rigor,awake and alert, sitting in the chair  HEENT: no neck pain, no throat pain  Endocrine:c/o fatigue,tired   Respiratory: no cough,no shortness of breath, or wheezing   Cardiovascular: no chest pain   Gastrointestinal: no abdominal pain,no N/V/D,c/o burning feeling around rectum   Genito-Urinary: no dysuria, trouble voiding, or hematuria   Musculoskeletal: no edema, myalgias, leg swellling  Neurological: c/o generalized weakness  Dermatological: maculopapular rash on the back resolved, no ulcer    Physical Examination:     Blood pressure 119/71, pulse 56, temperature 97.9 F (36.6 C), temperature source Oral, resp. rate 20, height 1.651 m (5\' 5" ), weight 45.36 kg (100 lb), SpO2 97.00%.     General Appearance: Comfortable, and in no acute distress. Awake,alert   HEENT: Pupils are equal, round, and reactive to light.    Lungs:  Bilateral crackles   Heart: bradycardia   Chest: Symmetric chest wall expansion.minimal rash at anterior chest    Abdomen: soft ,non tender,no hepatosplenomegaly   Neurological: No focal deficit   Extremities: No edema,no signs of infection around rectum    Laboratory And Diagnostic Studies:     Recent Labs    [facility] 07/14/12 0420 07/13/12 0405    WBC 0.50* 0.25*    HGB 8.5* 8.5*    HCT 24.8* 25.0*    PLT 68* 82*     Recent Labs   [facility]ename 07/14/12 0420 07/13/12 0405    NA 140 140    K 4.1 4.2    CL 108* 108*    CO2 28 27    BUN 13.0 13.0    CREAT 0.5* 0.5*    GLU 89 119*    CA 8.7 8.7     Recent Labs   Basename 07/14/12 0420 07/13/12 0405    AST 18 41*    ALT 174* 230*    ALKPHOS 55 58    PROT 4.9* 5.0*    ALB 2.6* 2.7*       Current Meds:      Scheduled Meds: PRN Meds:           acyclovir 400 mg Oral Q12H SCH   ceftAZIDime 1 g Intravenous Q8H   [COMPLETED] cytarabine (CYTOSAR) continuous infusion 100 mg/m2 (Order-Specific) Intravenous Q24H   fluconazole 400 mg Intravenous Q24H [facility]   lactobacillus/streoptococcus 1 capsule Oral Daily   pantoprazole 40 mg Oral QAM AC   polyethylene glycol 17 g Oral Daily   [DISCONTINUED] levofloxacin 500 mg Intravenous Q24H       Continuous Infusions:       . sodium chloride 50 mL/hr at 07/12/12 0424   .  sodium chloride           sodium chloride 200 mL/hr Continuous PRN   acetaminophen 650 mg Q4H PRN   acetaminophen 650 mg Q4H PRN   ALPRAZolam 0.25 mg QHS PRN   alteplase 2 mg Once PRN   alum & mag hydroxide-simethicone 30 mL Q4H PRN   diphenhydrAMINE 50 mg PRN   diphenhydrAMINE-zinc acetate  TID PRN   EPINEPHrine 0.3 mg PRN   EPINEPHrine 0.1 mg PRN   hydrocortisone 100 mg PRN   lactulose 20 g BID PRN   lidocaine  PRN   loperamide 4 mg Once PRN   Followed by     [START ON 07/15/2012] loperamide 2 mg PRN   LORazepam 1 mg Q4H PRN   Or     LORazepam 1 mg Q4H PRN   Magic Mouthwash 30 mL Q4H PRN   morphine 2 mg Q2H PRN   naloxone 0.2 mg PRN   senna-docusate 1 tablet Q12H PRN   simethicone 80 mg Q6H PRN   sodium phosphate 1 enema QD PRN         Gabriela Giannelli A. Janalyn Rouse, M.D.  07/14/2012  10:15 AM

## 2012-07-14 NOTE — Progress Notes (Signed)
HEMATOLOGY ONCOLOGY PROGRESS NOTE    Date Time: 07/14/2012 9:01 AM  Patient Name: [name],[name]      IMPRESSION:     Patient Active Problem List   Diagnosis   . Chondromalacia of patella   . Neutropenic fever         PLAN: Doing well.  Infusion complete.  Day 14 marrow due in one week; for now it is just supportive care.  No need for transfusion.  Continue mouth rinses and ambulation.             HISTORY: Ms. Klosinski is a 57 year old who was in her usual state of excellent health until approximately 5 days prior to admission when she began to experience arthralgias, myalgias, and some rhinorrhea. She was noted to have temperature   elevations to as high as 102 and 103 degrees. 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 Cytogenetics revealed a normal 46XX chromosomal pattern. She is bcr/abl negative and FISH is pending.   She started 7+3 07/06/12. Today is day 7.  Interval History:  She continues to ambulate without difficulty.  ROS remarkable for constipation.         PHYSICAL EXAM:     Filed Vitals:    07/14/12 0824   BP: 119/71   Pulse: 56   Temp: 97.9 F (36.6 C)   Resp: 20   SpO2: 97%       Intake and Output Summary (Last 24 hours) at Date Time    Intake/Output Summary (Last 24 hours) at 07/14/12 0901  Last data filed at 07/14/12 0600   Gross per 24 hour   Intake    600 ml   Output      0 ml   Net    600 ml       Appearance: in no acute distress  HEENT: pharynx clear  Lungs: clear to ausculation  Heart: regular rate and rhythm, normal heart sounds  Abdomen: flat and soft, active bowel sounds  Extremities: no edema  Neuro: alert, appropriate  MEDS:   Scheduled Meds:  Current Facility-Administered Medications   Medication Dose Route Frequency   . acyclovir  400 mg Oral Q12H SCH   . ceftAZIDime  1 g Intravenous Q8H   . [COMPLETED] cytarabine (CYTOSAR) continuous infusion  100 mg/m2 (Order-Specific) Intravenous Q24H   . fluconazole  400 mg Intravenous Q24H SCH   .  lactobacillus/streoptococcus  1 capsule Oral Daily   . pantoprazole  40 mg Oral QAM AC   . polyethylene glycol  17 g Oral Daily   . [DISCONTINUED] levofloxacin  500 mg Intravenous Q24H     Continuous Infusions:       . sodium chloride 50 mL/hr at 07/12/12 0424   . sodium chloride       PRN Meds:.sodium chloride, acetaminophen, acetaminophen, ALPRAZolam, alteplase, alum & mag hydroxide-simethicone, diphenhydrAMINE, diphenhydrAMINE-zinc acetate, EPINEPHrine, EPINEPHrine, hydrocortisone, lactulose, lidocaine, [START ON 07/15/2012] loperamide, loperamide, LORazepam, LORazepam, Magic Mouthwash, morphine, naloxone, senna-docusate, simethicone, sodium phosphate    LABS:     Results     Procedure Component Value Units Date/Time    Cell MorpHology [[id number]]  (Abnormal) Collected:07/14/12 0420     Cell Morphology: Abnormal (A) Updated:07/14/12 0718     Anisocytosis =1+ (A)     CBC and differential [[id number]]  (Abnormal) Collected:07/14/12 0420    Specimen Information:Blood / Blood Updated:07/14/12 0718     WBC 0.50 (L)      RBC 2.57 (L)  Hgb 8.5 (L) g/dL      Hematocrit 78.2 (L) %      MCV 96.5 fL      MCH 33.1 (H) pg      MCHC 34.3 g/dL      RDW 13 %      Platelets 68 (L)      MPV 10.5 fL     Manual Differential [[id number]]  (Abnormal) Collected:07/14/12 0420     Segmented Neutrophils 5 % Updated:07/14/12 0718     Band Neutrophils 0 %      Lymphocytes Manual 95 %      Monocytes Manual 0 %      Eosinophils Manual 0 %      Basophils Manual 0 %      Nucleated RBC 0      Abs Seg Manual 0.03 (L)      Bands Absolute 0.00      Absolute Lymph Manual 0.48 (L)      Monocytes Absolute 0.00      Absolute Eos Manual 0.00      Absolute Baso Manual 0.00     Comprehensive metabolic panel [[id number]]  (Abnormal) Collected:07/14/12 0420    Specimen Information:Blood Updated:07/14/12 0542     Glucose 89 mg/dL      BUN 52.8 mg/dL      Creatinine 0.5 (L) mg/dL      Sodium 413      Potassium 4.1      Chloride 108 (H)      CO2 28      CALCIUM  8.7 mg/dL      Protein, Total 4.9 (L) g/dL      Albumin 2.6 (L) g/dL      AST (SGOT) 18 U/L      ALT 174 (H) U/L      Alkaline Phosphatase 55 U/L      Bilirubin, Total 0.7 mg/dL      Globulin 2.3 g/dL      Albumin/Globulin Ratio 1.1      Anion Gap 4.0 (L)     HEMOLYZED INDEX [[id number]] Collected:07/14/12 0420     Hemolyzed Index 6 Updated:07/14/12 0542    GFR [[id number]] Collected:07/14/12 0420     EGFR >60.0 Updated:07/14/12 0542    Cytomegalovirus (CMV) antibody, IgG, EIA [[id number]] Collected:07/12/12 0600    Specimen Information:Blood Updated:07/13/12 1204     CMV AB, IgG 0.23           IMAGING DATA:  Xr Chest 2 Views    06/26/2012   CLINICAL INDICATION: pneumonia  COMPARISON: None available  FINDINGS:   2 views of the chest were obtained. The lungs are clear. The heart and vascularity are within normal limits.  There is no pleural thickening or effusion. The osseous structures are unremarkable.      06/26/2012   No active cardiopulmonary disease   Heron Nay, MD  06/26/2012 11:12 PM     Chest 2 Views    06/25/2012  CLINICAL INDICATION: Fever  COMPARISON: None.  INTERPRETATION: Frontal and lateral views of the chest obtained. Cardiomediastinal contour within normal limits for age. Clear lungs and sharp sulci.           06/25/2012   No acute cardiopulmonary process.  Mitali  Bapna, MD  06/25/2012 10:43 PM     Ct Sinus Facial Bones Wo Contrast    06/28/2012  HISTORY: Sinusitis  COMPARISON: None.  TECHNIQUE: Axial CT scans slices of the paranasal sinuses and facial bones with sagittal and coronal  reformats  FINDINGS: No fracture or other bony abnormality. The sinuses are clear. The ostiomeatal complexes are patent. There is no mass or nasopharynx. There is a left concha bullosa. The nasal septum is midline.      06/28/2012   Normal  Lynnae Prude, MD  06/28/2012 2:42 PM     Ct Chest Wo Contrast    06/28/2012  HISTORY: Cough  COMPARISON: None  TECHNIQUE:  Helical CT scan of the chest was obtained from the apices to the  lung bases without intravenous contrast.      FINDINGS:  Lungs and central airways: Atelectasis or scarring in the lower lobes. Pleura: Trace effusions bilaterally. Aorta and Great Vessels: Within normal limits. Heart: No pericardial effusion. No coronary artery calcification. Mediastinum and hila: No mass or adenopathy Upper Abdomen: No significant abnormality. Bones: No significant abnormality. Chest wall and lower neck: Normal       06/28/2012   Atelectasis or scarring in the lower lobes. Trace bilateral pleural effusions.  Lynnae Prude, MD  06/28/2012 2:47 PM     Nm Cardiac Muga (at Rest)    07/02/2012  CLINICAL INDICATION: Prechemotherapy evaluation. Leukemia.  TECHNIQUE: A dynamic MUGA scan was obtained. Imaging obtained in the LAO, lateral, and anterior projections. The left ventricle region of interest was drawn, on the best separation view, in end-diastole and end-systole, and ejection fraction was calculated. 23.4 mCi of Tc32m labeled autologous RBCs, using the Ultratag technique was administered.  FINDINGS: Normal wall motion is seen in the left ventricle. The ejection fraction is calculated at 79%, which is normal.      07/02/2012   NORMAL EJECTION FRACTION, OF 79%.  Darnelle Maffucci, MD  07/02/2012 2:23 PM     Biopsy, Bone Marrow    06/29/2012  EXAMINATION: Fluoroscopic guided bone marrow aspirate and core biopsy.  INTERVENTIONALIST: Verlee Rossetti, MD.  HISTORY:  Patient is a 57 year old female with pancytopenia and neutropenic fever.  Anesthesia: Moderate sedation with Fentanyl and Versed was administered with appropriate physiologic monitoring using an independent trained observer for 30 minutes. 1% lidocaine local was administered as well.  TECHNIQUE: With the patient in the prone position the skin over the left iliac crest was prepped and draped in usual sterile fashion. Local anesthesia was applied to superficial and deep tissues with 2% lidocaine. Subsequently, under direct fluoroscopic guidance, a bone  marrow needle was advanced to the posteromedial aspect of the superior left iliac crest. The outer cortex was traversed and bone marrow aspiration performed and the needle removed.    The bone marrow core needle was then advanced to a separate site on the iliac crest to obtain a core of bone marrow tissue. The samples were given to hematology for analysis. The patient tolerated procedure well without apparent competition.  INTERPRETATION: Fluoroscopy revealed successful access to the left iliac crest marrow cavity for bone marrow aspiration and for core biopsy.  Fluoroscopy Time: 0.3 min      06/29/2012   Technically successful fluoroscopic guided bone marrow aspirate and core biopsy for pancytopenia.  Larrie Kass, MD  06/29/2012 4:21 PM     Tunneled Cath Placement (permcath)    07/03/2012   Clinical history: Power line  Interventionalist: Dara Lords, MD  Informed consent obtained and all questions answered.  Anesthesia:  Moderate sedation with appropriate monitoring plus local. This consisted of Versed and fentanyl administration with continuous hemodynamic monitoring with a trained observer. Total anesthesia time with direct physician supervision 20 minutes.  Procedure: After standard prepping and draping with maximal sterile barrier technique and local anesthesia with 2% Xylocaine, percutaneous puncture of the right internal jugular vein is performed under ultrasound guidance with [place] image obtained for the medical record. The guidewire is advanced under fluoroscopic guidance. Dilator is advanced over the wire and left in place and flushed.  A double-lumen power line cuffed catheter is tunneled from the venotomy site to an exit site on the right anterior chest wall after prior infiltration of the right anterior chest wall with Xylocaine plus epinephrine.  The dilator at the venotomy site is exchanged for a peel-away sheath. The double-lumen power line (equivalent to Groshong) catheter is advanced through the  peel-away sheath. The peel-away sheath was removed. The catheter length is adjusted to position the catheter tip in the superior vena cava.  The catheter secured in place with 2-0 Prolene suture. The venotomy site was closed with  Dermabond.  Complications: None apparent.  Fluoroscopy time:0.3 minutes  Findings: The right internal jugular vein is patent by US imaging. An [place] image is obtained for the medical record. Catheter tip is in the superior vena cava.      07/03/2012    successful ultrasound and fluoroscopically guided placement of a right internal jugular dual-lumen cuffed power line catheter. The catheter may be used immediately.  Dara Lords, MD  07/03/2012 2:42 PM                       Gunnar Fusi, MD  [facility]    [phone]

## 2012-07-14 NOTE — Plan of Care (Signed)
Chemotherapy completed without any adverse reactions observed.  Heart rate above 40's, denies chest pain/SOB.  Will continue to monitor.  IV fluids infusing as ordered.

## 2012-07-14 NOTE — Plan of Care (Signed)
Problem: Hemodynamic Status: Immune Suppressed-Hematologic  Goal: Stable vital signs and fluid balance  Outcome: Progressing  Pt denies any pain at this time. Call bell placed within reach. Pt ambulatory. Appetite is good. Vital sign taken and recorded on epic. Neutropenic isolation maintained.

## 2012-07-14 NOTE — Plan of Care (Signed)
Problem: Infection/Potential for Infection  Goal: Free from infection  Outcome: Progressing  Neutropenic precautions maintained.  Vitals q4 WNL.  IV fluids and antibiotics given as ordered.      Comments:   Alert and orient x4, denies chest pain/SOB.  No distress observed.  Morning labs drawn and pending.

## 2012-07-15 LAB — MAN DIFF ONLY
Band Neutrophils Absolute: 0 (ref 0.00–1.00)
Band Neutrophils: 0 %
Basophils Absolute Manual: 0 (ref 0.00–0.20)
Basophils Manual: 0 %
Eosinophils Absolute Manual: 0 (ref 0.00–0.70)
Eosinophils Manual: 0 %
Lymphocytes Absolute Manual: 0.6 (ref 0.50–4.40)
Lymphocytes Manual: 100 %
Monocytes Absolute: 0 (ref 0.00–1.20)
Monocytes Manual: 0 %
Neutrophils Absolute Manual: 0 — ABNORMAL LOW (ref 1.80–8.10)
Nucleated RBC: 0 (ref 0–1)
Segmented Neutrophils: 0 %

## 2012-07-15 LAB — CBC AND DIFFERENTIAL
Hematocrit: 26.2 % — ABNORMAL LOW (ref 37.0–47.0)
Hgb: 9.2 g/dL — ABNORMAL LOW (ref 12.0–16.0)
MCH: 33.3 pg — ABNORMAL HIGH (ref 28.0–32.0)
MCHC: 35.1 g/dL (ref 32.0–36.0)
MCV: 94.9 fL (ref 80.0–100.0)
MPV: 10.8 fL (ref 9.4–12.3)
Platelets: 61 — ABNORMAL LOW (ref 140–400)
RBC: 2.76 — ABNORMAL LOW (ref 4.20–5.40)
RDW: 13 % (ref 12–15)
WBC: 0.6 — ABNORMAL LOW (ref 3.50–10.80)

## 2012-07-15 LAB — COMPREHENSIVE METABOLIC PANEL
ALT: 136 U/L — ABNORMAL HIGH (ref 0–55)
AST (SGOT): 15 U/L (ref 5–34)
Albumin/Globulin Ratio: 1.1 (ref 0.9–2.2)
Albumin: 2.8 g/dL — ABNORMAL LOW (ref 3.5–5.0)
Alkaline Phosphatase: 62 U/L (ref 40–150)
Anion Gap: 5 (ref 5.0–15.0)
BUN: 12 mg/dL (ref 7.0–19.0)
Bilirubin, Total: 0.6 mg/dL (ref 0.2–1.2)
CO2: 28 (ref 22–29)
Calcium: 9.1 mg/dL (ref 8.5–10.5)
Chloride: 107 (ref 98–107)
Creatinine: 0.5 mg/dL — ABNORMAL LOW (ref 0.6–1.0)
Globulin: 2.5 g/dL (ref 2.0–3.6)
Glucose: 87 mg/dL (ref 70–100)
Potassium: 4 (ref 3.5–5.1)
Protein, Total: 5.3 g/dL — ABNORMAL LOW (ref 6.0–8.3)
Sodium: 140 (ref 136–145)

## 2012-07-15 LAB — CELL MORPHOLOGY: Cell Morphology: ABNORMAL — AB

## 2012-07-15 LAB — GFR: EGFR: >60

## 2012-07-15 LAB — HEMOLYSIS INDEX: Hemolysis Index: 6 (ref 0–18)

## 2012-07-15 NOTE — Progress Notes (Signed)
HEMATOLOGY ONCOLOGY PROGRESS NOTE    Date Time: 07/15/2012 8:10 AM  Patient Name: [name],[name]      IMPRESSION:     Patient Active Problem List   Diagnosis   . Chondromalacia of patella   . Neutropenic fever         PLAN: Doing well.  Infusion completed. No transfusion today.  Continue mouth rinses and ambulation.             HISTORY: [name] is a 57 year old who was in her usual state of excellent health until approximately 5 days prior to admission when she began to experience arthralgias, myalgias, and some rhinorrhea. She was noted to have temperature   elevations to as high as 102 and 103 degrees. 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 Cytogenetics revealed a normal 46XX chromosomal pattern. She is bcr/abl negative and FISH is pending.   She started 7+3 07/06/12. Today is day 7.  Interval History:  She continues to ambulate without difficulty. No complaints         PHYSICAL EXAM:     Filed Vitals:    07/15/12 0647   BP: 131/65   Pulse: 55   Temp: 97.3 F (36.3 C)   Resp: 18   SpO2: 96%       Intake and Output Summary (Last 24 hours) at Date Time  No intake or output data in the 24 hours ending 07/15/12 0810    Appearance: in no acute distress  HEENT: pharynx clear  Lungs: clear to ausculation  Heart: regular rate and rhythm, normal heart sounds  Abdomen: flat and soft, active bowel sounds  Extremities: no edema  Neuro: alert, appropriate  MEDS:   Scheduled Meds:  Current Facility-Administered Medications   Medication Dose Route Frequency   . acyclovir  400 mg Oral Q12H SCH   . ceftAZIDime  1 g Intravenous Q8H   . fluconazole  400 mg Intravenous Q24H SCH   . lactobacillus/streoptococcus  1 capsule Oral Daily   . pantoprazole  40 mg Oral QAM AC   . polyethylene glycol  17 g Oral Daily     Continuous Infusions:       . sodium chloride 50 mL/hr at 07/15/12 0600   . sodium chloride       PRN Meds:.sodium chloride, acetaminophen, acetaminophen, ALPRAZolam, alteplase, alum & mag  hydroxide-simethicone, diphenhydrAMINE, diphenhydrAMINE-zinc acetate, EPINEPHrine, EPINEPHrine, hydrocortisone, lactulose, lidocaine, loperamide, loperamide, LORazepam, LORazepam, Magic Mouthwash, morphine, naloxone, senna-docusate, simethicone, sodium phosphate    LABS:     Results     Procedure Component Value Units Date/Time    Manual Differential [[id number]]  (Abnormal) Collected:07/15/12 0607     Segmented Neutrophils 0 % Updated:07/15/12 0750     Band Neutrophils 0 %      Lymphocytes Manual 100 %      Monocytes Manual 0 %      Eosinophils Manual 0 %      Basophils Manual 0 %      Nucleated RBC 0      Abs Seg Manual 0.00 (L)      Bands Absolute 0.00      Absolute Lymph Manual 0.60      Monocytes Absolute 0.00      Absolute Eos Manual 0.00      Absolute Baso Manual 0.00     Cell MorpHology [[id number]]  (Abnormal) Collected:07/15/12 0607     Cell Morphology: Abnormal (A) Updated:07/15/12 0750     Anisocytosis =1+ (A)  CBC and differential [[id number]]  (Abnormal) Collected:07/15/12 0607    Specimen Information:Blood / Blood Updated:07/15/12 0750     WBC 0.60 (L)      RBC 2.76 (L)      Hgb 9.2 (L) g/dL      Hematocrit 40.9 (L) %      MCV 94.9 fL      MCH 33.3 (H) pg      MCHC 35.1 g/dL      RDW 13 %      Platelets 61 (L)      MPV 10.8 fL     Comprehensive metabolic panel [[id number]]  (Abnormal) Collected:07/15/12 0607    Specimen Information:Blood Updated:07/15/12 0656     Glucose 87 mg/dL      BUN 95.6 mg/dL      Creatinine 0.5 (L) mg/dL      Sodium 213      Potassium 4.0      Chloride 107      CO2 28      CALCIUM 9.1 mg/dL      Protein, Total 5.3 (L) g/dL      Albumin 2.8 (L) g/dL      AST (SGOT) 15 U/L      ALT 136 (H) U/L      Alkaline Phosphatase 62 U/L      Bilirubin, Total 0.6 mg/dL      Globulin 2.5 g/dL      Albumin/Globulin Ratio 1.1      Anion Gap 5.0     HEMOLYZED INDEX [[id number]] Collected:07/15/12 0607     Hemolyzed Index 6 Updated:07/15/12 0656    GFR [[id number]] Collected:07/15/12 0607     EGFR  >60.0 Updated:07/15/12 0656    Packed Red Blood Cells - Product [[id number]] Collected:07/11/12 1043     RBC Leukoreduced Irradiated [id number]    transfused Updated:07/15/12 0057     RBC Leukoreduced Irradiated [id number]    released           IMAGING DATA:  Xr Chest 2 Views    06/26/2012   CLINICAL INDICATION: pneumonia  COMPARISON: None available  FINDINGS:   2 views of the chest were obtained. The lungs are clear. The heart and vascularity are within normal limits.  There is no pleural thickening or effusion. The osseous structures are unremarkable.      06/26/2012   No active cardiopulmonary disease   Heron Nay, MD  06/26/2012 11:12 PM     Chest 2 Views    06/25/2012  CLINICAL INDICATION: Fever  COMPARISON: None.  INTERPRETATION: Frontal and lateral views of the chest obtained. Cardiomediastinal contour within normal limits for age. Clear lungs and sharp sulci.           06/25/2012   No acute cardiopulmonary process.  Mitali  Bapna, MD  06/25/2012 10:43 PM     Ct Sinus Facial Bones Wo Contrast    06/28/2012  HISTORY: Sinusitis  COMPARISON: None.  TECHNIQUE: Axial CT scans slices of the paranasal sinuses and facial bones with sagittal and coronal reformats  FINDINGS: No fracture or other bony abnormality. The sinuses are clear. The ostiomeatal complexes are patent. There is no mass or nasopharynx. There is a left concha bullosa. The nasal septum is midline.      06/28/2012   Normal  Lynnae Prude, MD  06/28/2012 2:42 PM     Ct Chest Wo Contrast    06/28/2012  HISTORY: Cough  COMPARISON: None  TECHNIQUE:  Helical CT scan of the chest  was obtained from the apices to the lung bases without intravenous contrast.      FINDINGS:  Lungs and central airways: Atelectasis or scarring in the lower lobes. Pleura: Trace effusions bilaterally. Aorta and Great Vessels: Within normal limits. Heart: No pericardial effusion. No coronary artery calcification. Mediastinum and hila: No mass or adenopathy Upper Abdomen: No significant  abnormality. Bones: No significant abnormality. Chest wall and lower neck: Normal       06/28/2012   Atelectasis or scarring in the lower lobes. Trace bilateral pleural effusions.  Lynnae Prude, MD  06/28/2012 2:47 PM     Nm Cardiac Muga (at Rest)    07/02/2012  CLINICAL INDICATION: Prechemotherapy evaluation. Leukemia.  TECHNIQUE: A dynamic MUGA scan was obtained. Imaging obtained in the LAO, lateral, and anterior projections. The left ventricle region of interest was drawn, on the best separation view, in end-diastole and end-systole, and ejection fraction was calculated. 23.4 mCi of Tc55m labeled autologous RBCs, using the Ultratag technique was administered.  FINDINGS: Normal wall motion is seen in the left ventricle. The ejection fraction is calculated at 79%, which is normal.      07/02/2012   NORMAL EJECTION FRACTION, OF 79%.  Darnelle Maffucci, MD  07/02/2012 2:23 PM     Biopsy, Bone Marrow    06/29/2012  EXAMINATION: Fluoroscopic guided bone marrow aspirate and core biopsy.  INTERVENTIONALIST: Verlee Rossetti, MD.  HISTORY:  Patient is a 57 year old female with pancytopenia and neutropenic fever.  Anesthesia: Moderate sedation with Fentanyl and Versed was administered with appropriate physiologic monitoring using an independent trained observer for 30 minutes. 1% lidocaine local was administered as well.  TECHNIQUE: With the patient in the prone position the skin over the left iliac crest was prepped and draped in usual sterile fashion. Local anesthesia was applied to superficial and deep tissues with 2% lidocaine. Subsequently, under direct fluoroscopic guidance, a bone marrow needle was advanced to the posteromedial aspect of the superior left iliac crest. The outer cortex was traversed and bone marrow aspiration performed and the needle removed.    The bone marrow core needle was then advanced to a separate site on the iliac crest to obtain a core of bone marrow tissue. The samples were given to hematology for  analysis. The patient tolerated procedure well without apparent competition.  INTERPRETATION: Fluoroscopy revealed successful access to the left iliac crest marrow cavity for bone marrow aspiration and for core biopsy.  Fluoroscopy Time: 0.3 min      06/29/2012   Technically successful fluoroscopic guided bone marrow aspirate and core biopsy for pancytopenia.  Larrie Kass, MD  06/29/2012 4:21 PM     Tunneled Cath Placement (permcath)    07/03/2012   Clinical history: Power line  Interventionalist: Dara Lords, MD  Informed consent obtained and all questions answered.  Anesthesia:  Moderate sedation with appropriate monitoring plus local. This consisted of Versed and fentanyl administration with continuous hemodynamic monitoring with a trained observer. Total anesthesia time with direct physician supervision 20 minutes.  Procedure: After standard prepping and draping with maximal sterile barrier technique and local anesthesia with 2% Xylocaine, percutaneous puncture of the right internal jugular vein is performed under ultrasound guidance with [place] image obtained for the medical record. The guidewire is advanced under fluoroscopic guidance. Dilator is advanced over the wire and left in place and flushed.  A double-lumen power line cuffed catheter is tunneled from the venotomy site to an exit site on the right anterior chest  wall after prior infiltration of the right anterior chest wall with Xylocaine plus epinephrine.  The dilator at the venotomy site is exchanged for a peel-away sheath. The double-lumen power line (equivalent to Groshong) catheter is advanced through the peel-away sheath. The peel-away sheath was removed. The catheter length is adjusted to position the catheter tip in the superior vena cava.  The catheter secured in place with 2-0 Prolene suture. The venotomy site was closed with  Dermabond.  Complications: None apparent.  Fluoroscopy time:0.3 minutes  Findings: The right internal jugular vein is  patent by US imaging. An [place] image is obtained for the medical record. Catheter tip is in the superior vena cava.      07/03/2012    successful ultrasound and fluoroscopically guided placement of a right internal jugular dual-lumen cuffed power line catheter. The catheter may be used immediately.  Dara Lords, MD  07/03/2012 2:42 PM                       Gunnar Fusi, MD  [facility]    [phone]

## 2012-07-15 NOTE — Plan of Care (Signed)
Problem: Infection/Potential for Infection  Goal: Free from infection  Outcome: Progressing  Neutropenic prec. Measures maintained.v/s monitored and i.v. Antibiotic given as orderred.no acute distress noted.will cont. To monitor pt. Plan of care and  Treatments closely.

## 2012-07-15 NOTE — Consults (Signed)
Nutritional Support Services  Nutrition Follow-up    [name] 57 y.o. female   MRN: [id number]    Summary of Nutrition Recommendations:  1) Continue current diet order  2) D/C ensure plus supplement  3) add yogurt, applesauce, magic cup, peanut butter and graham crackers for snacks in between meals   4) please weigh patient weekly  -----------------------------------------------------------------------------------------------------------------                                                     ASSESSMENT DATA     Nutrition: F/U from previous RD assessment (6/26) for malnutrition and increased nutrient needs. Pt constipation improving and dying to have some fresh salad.    Events of Current Admission: Pt diagnosed with leukemia during this admission, (6/20) tunneled central line placed, finished 1st chemo, still neutropenia precaution   Medical Hx:  has a past medical history of Asthma without status asthmaticus.  Social Hx: alcohol socially      Current Diet Order  Diet neutropenic - low bacteria  Intake: Pt reports pt eating well but tired of ensure plus.  Only drinks one bottle/day.  Pt willing to try 6 small meals/day. Willing to try magic cup instead     ANTHROPOMETRIC  Anthropometrics  Height: 165.1 cm (5\' 5" )  Weight: 45.36 kg (100 lb)  Weight Change: -9.91   IBW/kg (Calculated) Female: 61.83 kg  IBW/kg (Calculated) Female: 56.81 kg  BMI (calculated): 18.5     UBW= 115 lb.  Pt is currently 87% UBW of 115 lb  No new weight    Physical Appearance: pt appears underwt   Skin: none noted  GI function:+BM on 6/25     ESTIMATED NEEDS  Estimated Energy Needs  Total Energy Estimated Needs: 5409-8119 kcal/day  Method for Estimating Needs: 30-35 kcal/kg  Estimated Protein Needs  Total Protein Estimated Needs: 50-65 gm pro/day  Method for Estimating Needs: 1-1.3 gm pro/kg  Fluid Needs  Total Fluid Estimated Needs: ~1500 ml/day or per MD  Method for Estimating Needs: ~30 ml/kg    Pertinent Medications: decadron,  risaquad, miralax  IVF:         . sodium chloride 50 mL/hr at 07/15/12 0600   . sodium chloride         Pertinent labs:    Lab 07/15/12 0607 07/14/12 0420 07/13/12 0405 07/12/12 0600 07/12/12 0559 07/11/12 0556   NA 140 140 140 -- 140 140   K 4.0 4.1 4.2 -- 4.3 4.2   CL 107 108* 108* -- 109* 111*   CO2 28 28 27  -- 27 27   BUN 12.0 13.0 13.0 -- 13.0 12.0   CREAT 0.5* 0.5* 0.5* -- 0.5* 0.5*   GLU 87 89 119* -- 105* 102*   CA 9.1 8.7 8.7 -- 8.7 8.7   MG -- -- -- -- -- --   PHOS -- -- -- -- -- --   EGFR >60.0 >60.0 >60.0 -- >60.0 >60.0   WBC 0.60* 0.50* 0.25* 0.32* -- 0.49*   HCT 26.2* 24.8* 25.0* 25.9* -- 21.3*   HGB 9.2* 8.5* 8.5* 8.8* -- 7.3*   TRIG -- -- -- -- -- --   AMY -- -- -- -- -- --   LIP -- -- -- -- -- --     Blood sugars 87-119    Learning Needs:  Cancer booklet given to pt during previous RD visit (6/20)  Small frequent meals to maximize po intake (7/2)                                                    NUTRITION DIAGNOSIS     Increased nutrient needs related to medical condition- Leukemia as evidenced by increased estimated needs: Ongoing     Moderate malnutrition related to acute/chronic illness as evidenced by 87% UBW of 115 lb, 15 lb weight loss noted compared to usual body weight (13%) considered a significant amount, visible low muscle tone: ongoing                                                           INTERVENTION     Goal: Obtain adequate nutrition and halt weight loss    1) Continue current diet order  2) D/C ensure plus supplement  3) add yogurt, applesauce, magic cup, peanut butter and graham crackers for snacks in between meals                                                        MONITOR/EVALUATION     Monitor po intake, nutritional side effects of chemotherapy, GI fxn, labs, weight    RD to follow pt at moderate nutrition risk    Royetta Car, MS, RD, CDE  [phone] (office)  [phone] (spectra link)

## 2012-07-15 NOTE — Progress Notes (Signed)
Infectious Diseases & Tropical Medicine  Progress Note    07/15/2012   Vika Buske [id number],[id number] is a 56 y.o. female,       Assessment:     Acute leukemia - completed first chemotherapy  Rash resolved   Blood cultures -NGTD  S/P  Groshong catheter placement  MUGA scan-ejection fraction 79%   Lyme antibody test negative  Parvovirus IgM antibody is negative (IgG antibodies-2.0)  Remains afebrile  Pancytopenia / neutropenia  Elevated LFT's  improving  CXR -small bilateral pleural effusions  Clinically stable    Plan:      Continue ceftazidime   Continue diflucan and acyclovir    Monitor CBC and liver function tests closely   Continue probiotics   Reviewed notes   D/W patient in detail    ROS:     General: no fever, no chills, no rigor,awake and alert, sitting in the chair, feeling good  HEENT: no neck pain, no throat pain  Endocrine:denies fatigue  Respiratory: no cough,no shortness of breath, or wheezing   Cardiovascular: no chest pain   Gastrointestinal: no abdominal pain,no N/V/D  Genito-Urinary: no dysuria, trouble voiding, or hematuria, increased urinary frequency  Musculoskeletal: no edema, myalgias, leg swellling  Neurological: c/o generalized weakness  Dermatological: maculopapular rash on the back resolved, no ulcer    Physical Examination:     Blood pressure 131/65, pulse 55, temperature 97.3 F (36.3 C), temperature source Oral, resp. rate 18, height 1.651 m (5\' 5" ), weight 45.36 kg (100 lb), SpO2 96.00%.     General Appearance: Comfortable, and in no acute distress. Awake,alert, sitting in her chair   HEENT: Pupils are equal, round, and reactive to light.    Lungs:  CTA   Heart: bradycardia   Chest: Symmetric chest wall expansion.minimal rash at anterior chest    Abdomen: soft ,non tender,no hepatosplenomegaly   Neurological: No focal deficit   Extremities: No edema    Laboratory And Diagnostic Studies:     Recent Labs   [facility] 07/15/12 0607 07/14/12 0420    WBC 0.60* 0.50*     HGB 9.2* 8.5*    HCT 26.2* 24.8*    PLT 61* 68*     Recent Labs   Basename 07/15/12 0607 07/14/12 0420    NA 140 140    K 4.0 4.1    CL 107 108*    CO2 28 28    BUN 12.0 13.0    CREAT 0.5* 0.5*    GLU 87 89    CA 9.1 8.7     Recent Labs   Basename 07/15/12 0607 07/14/12 0420    AST 15 18    ALT 136* 174*    ALKPHOS 62 55    PROT 5.3* 4.9*    ALB 2.8* 2.6*       Current Meds:      Scheduled Meds: PRN Meds:           acyclovir 400 mg Oral Q12H SCH   ceftAZIDime 1 g Intravenous Q8H   fluconazole 400 mg Intravenous Q24H SCH   lactobacillus/streoptococcus 1 capsule Oral Daily   pantoprazole 40 mg Oral QAM AC   polyethylene glycol 17 g Oral Daily       Continuous Infusions:       . sodium chloride 50 mL/hr at 07/15/12 0600   . sodium chloride           sodium chloride 200 mL/hr Continuous PRN   acetaminophen 650 mg Q4H PRN  acetaminophen 650 mg Q4H PRN   ALPRAZolam 0.25 mg QHS PRN   alteplase 2 mg Once PRN   alum & mag hydroxide-simethicone 30 mL Q4H PRN   diphenhydrAMINE 50 mg PRN   diphenhydrAMINE-zinc acetate  TID PRN   EPINEPHrine 0.3 mg PRN   EPINEPHrine 0.1 mg PRN   hydrocortisone 100 mg PRN   lactulose 20 g BID PRN   lidocaine  PRN   loperamide 4 mg Once PRN   Followed by     loperamide 2 mg PRN   LORazepam 1 mg Q4H PRN   Or     LORazepam 1 mg Q4H PRN   Magic Mouthwash 30 mL Q4H PRN   morphine 2 mg Q2H PRN   naloxone 0.2 mg PRN   senna-docusate 1 tablet Q12H PRN   simethicone 80 mg Q6H PRN   sodium phosphate 1 enema QD PRN         Larayah Clute A. Janalyn Rouse, M.D.  07/15/2012  8:50 AM

## 2012-07-15 NOTE — Plan of Care (Signed)
Problem: Hemodynamic Status: Immune Suppressed-Hematologic  Goal: Stable vital signs and fluid balance  Outcome: Progressing  Patient maintained neutropenic precautions.  Administered IV antibiotics and IV fluids.  Patient ambulatory and walking in the halls.    Problem: Moderate/High Fall Risk Score >/=15  Goal: Patient will remain free of falls  Outcome: Progressing  Patient has remained free from falls during shift, call bell within reach and patient reminded to call if assistance is needed.

## 2012-07-15 NOTE — Progress Notes (Signed)
Daily PROGRESS NOTE    Date Time: 07/15/2012 3:24 PM  Patient Name: [name], [name]  Patient Status: Inpatient  Hospital Day: 20    Assessment:   Acute Leukemia, NOS   Neutropenia  .Pancytopenia.   . Weight loss with a body mass index of 18 shows mild to moderate   Malnutrition  . Skin rash.  Epigastric pain  Sinus brady    Plan:   1. Chemo in progress  2. Remote Tele, Heart rate better  3. Neutropenic precaution  4. Prophylactic Abx per ID  5. Monitor CBC, lytes  6. Transfuse RBCs, Platelets as needed  7. Nutrition supplement    Subjective:    Fatigue better today  10 point Review of Systems - Negative except for the Positives mentioned above      Medications:     Current Facility-Administered Medications   Medication Dose Route Frequency   . acyclovir  400 mg Oral Q12H SCH   . ceftAZIDime  1 g Intravenous Q8H   . fluconazole  400 mg Intravenous Q24H SCH   . lactobacillus/streoptococcus  1 capsule Oral Daily   . pantoprazole  40 mg Oral QAM AC   . polyethylene glycol  17 g Oral Daily          sodium chloride 200 mL/hr Continuous PRN   acetaminophen 650 mg Q4H PRN   acetaminophen 650 mg Q4H PRN   ALPRAZolam 0.25 mg QHS PRN   alteplase 2 mg Once PRN   alum & mag hydroxide-simethicone 30 mL Q4H PRN   diphenhydrAMINE 50 mg PRN   diphenhydrAMINE-zinc acetate  TID PRN   EPINEPHrine 0.3 mg PRN   EPINEPHrine 0.1 mg PRN   hydrocortisone 100 mg PRN   lactulose 20 g BID PRN   lidocaine  PRN   [EXPIRED] loperamide 4 mg Once PRN   Followed by     loperamide 2 mg PRN   LORazepam 1 mg Q4H PRN   Or     LORazepam 1 mg Q4H PRN   Magic Mouthwash 30 mL Q4H PRN   morphine 2 mg Q2H PRN   naloxone 0.2 mg PRN   senna-docusate 1 tablet Q12H PRN   simethicone 80 mg Q6H PRN   sodium phosphate 1 enema QD PRN        Physical Exam:     Filed Vitals:    07/15/12 1150   BP: 105/55   Pulse: 67   Temp: 96.6 F (35.9 C)   Resp: 18   SpO2: 98%       Intake and Output Summary (Last 24 hours) at Date Time    Intake/Output Summary (Last 24  hours) at 07/15/12 1524  Last data filed at 07/15/12 0900   Gross per 24 hour   Intake    400 ml   Output      0 ml   Net    400 ml       Physical Exam   Constitutional: She is oriented to person, place, and time. She appears well-developed.   HENT:   Head: Normocephalic and atraumatic.   Eyes: Pupils are equal, round, and reactive to light.   Neck: No JVD present. No tracheal deviation present. No thyromegaly present.   Cardiovascular: Normal rate and normal heart sounds.    Pulmonary/Chest: No respiratory distress. She has no wheezes. She has no rales.   Abdominal: She exhibits no distension. There is no tenderness. There is no rebound and no guarding.   Musculoskeletal: She  exhibits no edema.   Neurological: She is alert and oriented to person, place, and time.           Labs:     Results     Procedure Component Value Units Date/Time    Manual Differential [[id number]]  (Abnormal) Collected:07/15/12 0607     Segmented Neutrophils 0 % Updated:07/15/12 0750     Band Neutrophils 0 %      Lymphocytes Manual 100 %      Monocytes Manual 0 %      Eosinophils Manual 0 %      Basophils Manual 0 %      Nucleated RBC 0      Abs Seg Manual 0.00 (L)      Bands Absolute 0.00      Absolute Lymph Manual 0.60      Monocytes Absolute 0.00      Absolute Eos Manual 0.00      Absolute Baso Manual 0.00     Cell MorpHology [[id number]]  (Abnormal) Collected:07/15/12 0607     Cell Morphology: Abnormal (A) Updated:07/15/12 0750     Anisocytosis =1+ (A)     CBC and differential [[id number]]  (Abnormal) Collected:07/15/12 0607    Specimen Information:Blood / Blood Updated:07/15/12 0750     WBC 0.60 (L)      RBC 2.76 (L)      Hgb 9.2 (L) g/dL      Hematocrit 08.6 (L) %      MCV 94.9 fL      MCH 33.3 (H) pg      MCHC 35.1 g/dL      RDW 13 %      Platelets 61 (L)      MPV 10.8 fL     Comprehensive metabolic panel [[id number]]  (Abnormal) Collected:07/15/12 0607    Specimen Information:Blood Updated:07/15/12 0656     Glucose 87 mg/dL      BUN 52.8  mg/dL      Creatinine 0.5 (L) mg/dL      Sodium 413      Potassium 4.0      Chloride 107      CO2 28      CALCIUM 9.1 mg/dL      Protein, Total 5.3 (L) g/dL      Albumin 2.8 (L) g/dL      AST (SGOT) 15 U/L      ALT 136 (H) U/L      Alkaline Phosphatase 62 U/L      Bilirubin, Total 0.6 mg/dL      Globulin 2.5 g/dL      Albumin/Globulin Ratio 1.1      Anion Gap 5.0     HEMOLYZED INDEX [[id number]] Collected:07/15/12 0607     Hemolyzed Index 6 Updated:07/15/12 0656    GFR [[id number]] Collected:07/15/12 0607     EGFR >60.0 Updated:07/15/12 0656    Packed Red Blood Cells - Product [[id number]] Collected:07/11/12 1043     RBC Leukoreduced Irradiated [id number]    transfused Updated:07/15/12 0057     RBC Leukoreduced Irradiated [id number]    released             Rads:     Xr Chest 2 Views    06/26/2012   CLINICAL INDICATION: pneumonia  COMPARISON: None available  FINDINGS:   2 views of the chest were obtained. The lungs are clear. The heart and vascularity are within normal limits.  There is no pleural thickening or effusion. The osseous structures are unremarkable.  06/26/2012   No active cardiopulmonary disease   Heron Nay, MD  06/26/2012 11:12 PM     Chest 2 Views    06/25/2012  CLINICAL INDICATION: Fever  COMPARISON: None.  INTERPRETATION: Frontal and lateral views of the chest obtained. Cardiomediastinal contour within normal limits for age. Clear lungs and sharp sulci.           06/25/2012   No acute cardiopulmonary process.  Filbert Schilder, MD  06/25/2012 10:43 PM         Signed by: Eric Form, MD  Pager: [phone]

## 2012-07-15 NOTE — Plan of Care (Signed)
Problem: Safety  Goal: Patient will be free from injury during hospitalization  Outcome: Progressing  Bed on low & call bell with in reach. Bed alarm on. Fall precaution & safety maintained.    Problem: Hemodynamic Status: Immune Suppressed-Hematologic  Goal: Stable vital signs and fluid balance  Outcome: Progressing  Pt VS are WNL except HR-56. No c/o pain or N/V or SOB or any other distress noted or stated in this shift. Maintained pt on neutropenic precaution as ordered. IV antibiotic given & Am labs drawn as ordered.    Comments:   This RN took care of this pt from 2330-0730.

## 2012-07-16 LAB — COMPREHENSIVE METABOLIC PANEL
ALT: 108 U/L — ABNORMAL HIGH (ref 0–55)
AST (SGOT): 14 U/L (ref 5–34)
Albumin/Globulin Ratio: 1.1 (ref 0.9–2.2)
Albumin: 2.8 g/dL — ABNORMAL LOW (ref 3.5–5.0)
Alkaline Phosphatase: 66 U/L (ref 40–150)
Anion Gap: 5 (ref 5.0–15.0)
BUN: 14 mg/dL (ref 7.0–19.0)
Bilirubin, Total: 0.6 mg/dL (ref 0.2–1.2)
CO2: 28 (ref 22–29)
Calcium: 9.4 mg/dL (ref 8.5–10.5)
Chloride: 106 (ref 98–107)
Creatinine: 0.6 mg/dL (ref 0.6–1.0)
Globulin: 2.5 g/dL (ref 2.0–3.6)
Glucose: 86 mg/dL (ref 70–100)
Potassium: 4.1 (ref 3.5–5.1)
Protein, Total: 5.3 g/dL — ABNORMAL LOW (ref 6.0–8.3)
Sodium: 139 (ref 136–145)

## 2012-07-16 LAB — CBC AND DIFFERENTIAL
Hematocrit: 26.7 % — ABNORMAL LOW (ref 37.0–47.0)
Hgb: 9.4 g/dL — ABNORMAL LOW (ref 12.0–16.0)
MCH: 33.3 pg — ABNORMAL HIGH (ref 28.0–32.0)
MCHC: 35.2 g/dL (ref 32.0–36.0)
MCV: 94.7 fL (ref 80.0–100.0)
MPV: 11.4 fL (ref 9.4–12.3)
Platelets: 50 — ABNORMAL LOW (ref 140–400)
RBC: 2.82 — ABNORMAL LOW (ref 4.20–5.40)
RDW: 13 % (ref 12–15)
WBC: 0.69 — ABNORMAL LOW (ref 3.50–10.80)

## 2012-07-16 LAB — MAN DIFF ONLY
Band Neutrophils Absolute: 0 (ref 0.00–1.00)
Band Neutrophils: 0 %
Basophils Absolute Manual: 0 (ref 0.00–0.20)
Basophils Manual: 0 %
Eosinophils Absolute Manual: 0 (ref 0.00–0.70)
Eosinophils Manual: 0 %
Lymphocytes Absolute Manual: 0.69 (ref 0.50–4.40)
Lymphocytes Manual: 100 %
Monocytes Absolute: 0 (ref 0.00–1.20)
Monocytes Manual: 0 %
Neutrophils Absolute Manual: 0 — ABNORMAL LOW (ref 1.80–8.10)
Nucleated RBC: 0 (ref 0–1)
Segmented Neutrophils: 0 %

## 2012-07-16 LAB — GFR: EGFR: >60

## 2012-07-16 LAB — HEMOLYSIS INDEX: Hemolysis Index: 10 (ref 0–18)

## 2012-07-16 LAB — CELL MORPHOLOGY: Cell Morphology: ABNORMAL — AB

## 2012-07-16 MED ORDER — MAGIC MOUTHWASH ORAL SUSPENSION
15.0000 mL | Freq: Four times a day (QID) | ORAL | Status: DC
Start: 2012-07-17 — End: 2012-07-17
  Administered 2012-07-17: 15 mL via ORAL
  Filled 2012-07-16: qty 120

## 2012-07-16 MED ORDER — DOCUSATE SODIUM 100 MG PO CAPS
100.0000 mg | ORAL_CAPSULE | Freq: Every day | ORAL | Status: DC
Start: 2012-07-16 — End: 2012-07-28
  Administered 2012-07-16 – 2012-07-28 (×13): 100 mg via ORAL
  Filled 2012-07-16 (×13): qty 1

## 2012-07-16 MED ORDER — BISACODYL 5 MG PO TBEC
5.0000 mg | DELAYED_RELEASE_TABLET | Freq: Every day | ORAL | Status: DC | PRN
Start: 2012-07-16 — End: 2012-07-28
  Administered 2012-07-18 – 2012-07-25 (×3): 5 mg via ORAL
  Filled 2012-07-16 (×3): qty 1

## 2012-07-16 NOTE — Progress Notes (Signed)
HEMATOLOGY ONCOLOGY PROGRESS NOTE    Date Time: 07/16/2012 9:05 AM  Patient Name: [name]      IMPRESSION:     Patient Active Problem List   Diagnosis   . Chondromalacia of patella   . Neutropenic fever   -AML -M0      PLAN: Doing well.  No complaints.  Day 14 marrow likely on Tuesday of next week.  Will add stool softeners as the patient has been constipated.             HISTORY: [name] is a 57 year old who was in her usual state of excellent health until approximately 5 days prior to admission when she began to experience arthralgias, myalgias, and some rhinorrhea. She was noted to have temperature   elevations to as high as 102 and 103 degrees. 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 Cytogenetics revealed a normal 46XX chromosomal pattern. She is bcr/abl negative and FISH is pending.   She started 7+3 07/06/12. Today is day 7.  Interval History:  She continues to ambulate without difficulty. No complaints except for constipation for two days.         PHYSICAL EXAM:     Filed Vitals:    07/16/12 0631   BP: 106/55   Pulse: 58   Temp: 99.2 F (37.3 C)   Resp: 18   SpO2: 99%       Intake and Output Summary (Last 24 hours) at Date Time    Intake/Output Summary (Last 24 hours) at 07/16/12 0905  Last data filed at 07/15/12 1754   Gross per 24 hour   Intake    800 ml   Output      0 ml   Net    800 ml       Appearance: in no acute distress  HEENT: pharynx clear  Lungs: clear to ausculation  Heart: regular rate and rhythm, normal heart sounds  Abdomen: flat and soft, active bowel sounds  Extremities: no edema  Neuro: alert, appropriate  MEDS:   Scheduled Meds:  Current Facility-Administered Medications   Medication Dose Route Frequency   . acyclovir  400 mg Oral Q12H SCH   . ceftAZIDime  1 g Intravenous Q8H   . fluconazole  400 mg Intravenous Q24H SCH   . lactobacillus/streoptococcus  1 capsule Oral Daily   . pantoprazole  40 mg Oral QAM AC   . polyethylene glycol  17 g Oral  Daily     Continuous Infusions:       . sodium chloride 50 mL/hr at 07/16/12 0447   . sodium chloride       PRN Meds:.sodium chloride, acetaminophen, acetaminophen, ALPRAZolam, alteplase, alum & mag hydroxide-simethicone, diphenhydrAMINE, diphenhydrAMINE-zinc acetate, EPINEPHrine, EPINEPHrine, hydrocortisone, lactulose, lidocaine, loperamide, LORazepam, LORazepam, Magic Mouthwash, morphine, naloxone, senna-docusate, simethicone, sodium phosphate    LABS:     Results     Procedure Component Value Units Date/Time    Manual Differential [[id number]]  (Abnormal) Collected:07/16/12 0456     Segmented Neutrophils 0 % Updated:07/16/12 0748     Band Neutrophils 0 %      Lymphocytes Manual 100 %      Monocytes Manual 0 %      Eosinophils Manual 0 %      Basophils Manual 0 %      Nucleated RBC 0      Abs Seg Manual 0.00 (L)      Bands Absolute 0.00      Absolute  Lymph Manual 0.69      Monocytes Absolute 0.00      Absolute Eos Manual 0.00      Absolute Baso Manual 0.00     Cell MorpHology [[id number]]  (Abnormal) Collected:07/16/12 0456     Cell Morphology: Abnormal (A) Updated:07/16/12 0748     Anisocytosis =1+ (A)     CBC and differential [[id number]]  (Abnormal) Collected:07/16/12 0456    Specimen Information:Blood / Blood Updated:07/16/12 0748     WBC 0.69 (L)      RBC 2.82 (L)      Hgb 9.4 (L) g/dL      Hematocrit 78.2 (L) %      MCV 94.7 fL      MCH 33.3 (H) pg      MCHC 35.2 g/dL      RDW 13 %      Platelets 50 (L)      MPV 11.4 fL     Comprehensive metabolic panel [[id number]]  (Abnormal) Collected:07/16/12 0456    Specimen Information:Blood Updated:07/16/12 0620     Glucose 86 mg/dL      BUN 57.8 mg/dL      Creatinine 0.6 mg/dL      Sodium 469      Potassium 4.1      Chloride 106      CO2 28      CALCIUM 9.4 mg/dL      Protein, Total 5.3 (L) g/dL      Albumin 2.8 (L) g/dL      AST (SGOT) 14 U/L      ALT 108 (H) U/L      Alkaline Phosphatase 66 U/L      Bilirubin, Total 0.6 mg/dL      Globulin 2.5 g/dL       Albumin/Globulin Ratio 1.1      Anion Gap 5.0     HEMOLYZED INDEX [[id number]] Collected:07/16/12 0456     Hemolyzed Index 10 Updated:07/16/12 0620    GFR [[id number]] Collected:07/16/12 0456     EGFR >60.0 Updated:07/16/12 0620          IMAGING DATA:  Xr Chest 2 Views    06/26/2012   CLINICAL INDICATION: pneumonia  COMPARISON: None available  FINDINGS:   2 views of the chest were obtained. The lungs are clear. The heart and vascularity are within normal limits.  There is no pleural thickening or effusion. The osseous structures are unremarkable.      06/26/2012   No active cardiopulmonary disease   Heron Nay, MD  06/26/2012 11:12 PM     Chest 2 Views    06/25/2012  CLINICAL INDICATION: Fever  COMPARISON: None.  INTERPRETATION: Frontal and lateral views of the chest obtained. Cardiomediastinal contour within normal limits for age. Clear lungs and sharp sulci.           06/25/2012   No acute cardiopulmonary process.  Mitali  Bapna, MD  06/25/2012 10:43 PM     Ct Sinus Facial Bones Wo Contrast    06/28/2012  HISTORY: Sinusitis  COMPARISON: None.  TECHNIQUE: Axial CT scans slices of the paranasal sinuses and facial bones with sagittal and coronal reformats  FINDINGS: No fracture or other bony abnormality. The sinuses are clear. The ostiomeatal complexes are patent. There is no mass or nasopharynx. There is a left concha bullosa. The nasal septum is midline.      06/28/2012   Normal  Lynnae Prude, MD  06/28/2012 2:42 PM     Ct Chest  Wo Contrast    06/28/2012  HISTORY: Cough  COMPARISON: None  TECHNIQUE:  Helical CT scan of the chest was obtained from the apices to the lung bases without intravenous contrast.      FINDINGS:  Lungs and central airways: Atelectasis or scarring in the lower lobes. Pleura: Trace effusions bilaterally. Aorta and Great Vessels: Within normal limits. Heart: No pericardial effusion. No coronary artery calcification. Mediastinum and hila: No mass or adenopathy Upper Abdomen: No significant abnormality.  Bones: No significant abnormality. Chest wall and lower neck: Normal       06/28/2012   Atelectasis or scarring in the lower lobes. Trace bilateral pleural effusions.  Lynnae Prude, MD  06/28/2012 2:47 PM     Nm Cardiac Muga (at Rest)    07/02/2012  CLINICAL INDICATION: Prechemotherapy evaluation. Leukemia.  TECHNIQUE: A dynamic MUGA scan was obtained. Imaging obtained in the LAO, lateral, and anterior projections. The left ventricle region of interest was drawn, on the best separation view, in end-diastole and end-systole, and ejection fraction was calculated. 23.4 mCi of Tc61m labeled autologous RBCs, using the Ultratag technique was administered.  FINDINGS: Normal wall motion is seen in the left ventricle. The ejection fraction is calculated at 79%, which is normal.      07/02/2012   NORMAL EJECTION FRACTION, OF 79%.  Darnelle Maffucci, MD  07/02/2012 2:23 PM     Biopsy, Bone Marrow    06/29/2012  EXAMINATION: Fluoroscopic guided bone marrow aspirate and core biopsy.  INTERVENTIONALIST: Verlee Rossetti, MD.  HISTORY:  Patient is a 57 year old female with pancytopenia and neutropenic fever.  Anesthesia: Moderate sedation with Fentanyl and Versed was administered with appropriate physiologic monitoring using an independent trained observer for 30 minutes. 1% lidocaine local was administered as well.  TECHNIQUE: With the patient in the prone position the skin over the left iliac crest was prepped and draped in usual sterile fashion. Local anesthesia was applied to superficial and deep tissues with 2% lidocaine. Subsequently, under direct fluoroscopic guidance, a bone marrow needle was advanced to the posteromedial aspect of the superior left iliac crest. The outer cortex was traversed and bone marrow aspiration performed and the needle removed.    The bone marrow core needle was then advanced to a separate site on the iliac crest to obtain a core of bone marrow tissue. The samples were given to hematology for analysis. The  patient tolerated procedure well without apparent competition.  INTERPRETATION: Fluoroscopy revealed successful access to the left iliac crest marrow cavity for bone marrow aspiration and for core biopsy.  Fluoroscopy Time: 0.3 min      06/29/2012   Technically successful fluoroscopic guided bone marrow aspirate and core biopsy for pancytopenia.  Larrie Kass, MD  06/29/2012 4:21 PM     Tunneled Cath Placement (permcath)    07/03/2012   Clinical history: Power line  Interventionalist: Dara Lords, MD  Informed consent obtained and all questions answered.  Anesthesia:  Moderate sedation with appropriate monitoring plus local. This consisted of Versed and fentanyl administration with continuous hemodynamic monitoring with a trained observer. Total anesthesia time with direct physician supervision 20 minutes.  Procedure: After standard prepping and draping with maximal sterile barrier technique and local anesthesia with 2% Xylocaine, percutaneous puncture of the right internal jugular vein is performed under ultrasound guidance with [place] image obtained for the medical record. The guidewire is advanced under fluoroscopic guidance. Dilator is advanced over the wire and left in place and flushed.  A double-lumen power line cuffed catheter is tunneled from the venotomy site to an exit site on the right anterior chest wall after prior infiltration of the right anterior chest wall with Xylocaine plus epinephrine.  The dilator at the venotomy site is exchanged for a peel-away sheath. The double-lumen power line (equivalent to Groshong) catheter is advanced through the peel-away sheath. The peel-away sheath was removed. The catheter length is adjusted to position the catheter tip in the superior vena cava.  The catheter secured in place with 2-0 Prolene suture. The venotomy site was closed with  Dermabond.  Complications: None apparent.  Fluoroscopy time:0.3 minutes  Findings: The right internal jugular vein is patent by US  imaging. An [place] image is obtained for the medical record. Catheter tip is in the superior vena cava.      07/03/2012    successful ultrasound and fluoroscopically guided placement of a right internal jugular dual-lumen cuffed power line catheter. The catheter may be used immediately.  Dara Lords, MD  07/03/2012 2:42 PM                       Gunnar Fusi, MD  [facility]    [phone]

## 2012-07-16 NOTE — Progress Notes (Signed)
Daily PROGRESS NOTE    Date Time: 07/16/2012 5:00 PM  Patient Name: [name], [name]  Patient Status: Inpatient  Hospital Day: 21    Assessment:   Acute Leukemia, NOS   Neutropenia  .Pancytopenia.   . Weight loss with a body mass index of 18 shows mild to moderate   Malnutrition  . Skin rash.  Epigastric pain  Sinus brady    Plan:   1. Chemo in progress  2. Remote Tele, Heart rate better  3. Neutropenic precaution  4. Prophylactic Abx per ID  5. Monitor CBC, lytes  6. Transfuse RBCs, Platelets as needed  7. Nutrition supplement    Subjective:    Fatigue better today  10 point Review of Systems - Negative except for the Positives mentioned above      Medications:     Current Facility-Administered Medications   Medication Dose Route Frequency   . acyclovir  400 mg Oral Q12H SCH   . ceftAZIDime  1 g Intravenous Q8H   . docusate sodium  100 mg Oral Daily   . fluconazole  400 mg Intravenous Q24H SCH   . lactobacillus/streoptococcus  1 capsule Oral Daily   . pantoprazole  40 mg Oral QAM AC   . polyethylene glycol  17 g Oral Daily          sodium chloride 200 mL/hr Continuous PRN   acetaminophen 650 mg Q4H PRN   acetaminophen 650 mg Q4H PRN   ALPRAZolam 0.25 mg QHS PRN   alteplase 2 mg Once PRN   alum & mag hydroxide-simethicone 30 mL Q4H PRN   bisacodyl 5 mg QD PRN   diphenhydrAMINE 50 mg PRN   diphenhydrAMINE-zinc acetate  TID PRN   EPINEPHrine 0.3 mg PRN   EPINEPHrine 0.1 mg PRN   hydrocortisone 100 mg PRN   lactulose 20 g BID PRN   lidocaine  PRN   loperamide 2 mg PRN   LORazepam 1 mg Q4H PRN   Or     LORazepam 1 mg Q4H PRN   Magic Mouthwash 30 mL Q4H PRN   morphine 2 mg Q2H PRN   naloxone 0.2 mg PRN   simethicone 80 mg Q6H PRN   sodium phosphate 1 enema QD PRN   [DISCONTINUED] senna-docusate 1 tablet Q12H PRN        Physical Exam:     Filed Vitals:    07/16/12 1547   BP: 108/53   Pulse: 76   Temp: 97.3 F (36.3 C)   Resp: 18   SpO2: 96%       Intake and Output Summary (Last 24 hours) at Date  Time    Intake/Output Summary (Last 24 hours) at 07/16/12 1700  Last data filed at 07/15/12 1754   Gross per 24 hour   Intake    800 ml   Output      0 ml   Net    800 ml       Physical Exam   Constitutional: She is oriented to person, place, and time. She appears well-developed.   HENT:   Head: Normocephalic and atraumatic.   Eyes: Pupils are equal, round, and reactive to light.   Neck: No JVD present. No tracheal deviation present. No thyromegaly present.   Cardiovascular: Normal rate and normal heart sounds.    Pulmonary/Chest: No respiratory distress. She has no wheezes. She has no rales.   Abdominal: She exhibits no distension. There is no tenderness. There is no rebound and no guarding.  Musculoskeletal: She exhibits no edema.   Neurological: She is alert and oriented to person, place, and time.           Labs:     Results     Procedure Component Value Units Date/Time    Manual Differential [[id number]]  (Abnormal) Collected:07/16/12 0456     Segmented Neutrophils 0 % Updated:07/16/12 0748     Band Neutrophils 0 %      Lymphocytes Manual 100 %      Monocytes Manual 0 %      Eosinophils Manual 0 %      Basophils Manual 0 %      Nucleated RBC 0      Abs Seg Manual 0.00 (L)      Bands Absolute 0.00      Absolute Lymph Manual 0.69      Monocytes Absolute 0.00      Absolute Eos Manual 0.00      Absolute Baso Manual 0.00     Cell MorpHology [[id number]]  (Abnormal) Collected:07/16/12 0456     Cell Morphology: Abnormal (A) Updated:07/16/12 0748     Anisocytosis =1+ (A)     CBC and differential [[id number]]  (Abnormal) Collected:07/16/12 0456    Specimen Information:Blood / Blood Updated:07/16/12 0748     WBC 0.69 (L)      RBC 2.82 (L)      Hgb 9.4 (L) g/dL      Hematocrit 62.9 (L) %      MCV 94.7 fL      MCH 33.3 (H) pg      MCHC 35.2 g/dL      RDW 13 %      Platelets 50 (L)      MPV 11.4 fL     Comprehensive metabolic panel [[id number]]  (Abnormal) Collected:07/16/12 0456    Specimen Information:Blood Updated:07/16/12  0620     Glucose 86 mg/dL      BUN 01.0 mg/dL      Creatinine 0.6 mg/dL      Sodium 272      Potassium 4.1      Chloride 106      CO2 28      CALCIUM 9.4 mg/dL      Protein, Total 5.3 (L) g/dL      Albumin 2.8 (L) g/dL      AST (SGOT) 14 U/L      ALT 108 (H) U/L      Alkaline Phosphatase 66 U/L      Bilirubin, Total 0.6 mg/dL      Globulin 2.5 g/dL      Albumin/Globulin Ratio 1.1      Anion Gap 5.0     HEMOLYZED INDEX [[id number]] Collected:07/16/12 0456     Hemolyzed Index 10 Updated:07/16/12 0620    GFR [[id number]] Collected:07/16/12 0456     EGFR >60.0 Updated:07/16/12 0620            Rads:     Xr Chest 2 Views    06/26/2012   CLINICAL INDICATION: pneumonia  COMPARISON: None available  FINDINGS:   2 views of the chest were obtained. The lungs are clear. The heart and vascularity are within normal limits.  There is no pleural thickening or effusion. The osseous structures are unremarkable.      06/26/2012   No active cardiopulmonary disease   Heron Nay, MD  06/26/2012 11:12 PM     Chest 2 Views    06/25/2012  CLINICAL INDICATION: Fever  COMPARISON: None.  INTERPRETATION: Frontal and lateral views of the chest obtained. Cardiomediastinal contour within normal limits for age. Clear lungs and sharp sulci.           06/25/2012   No acute cardiopulmonary process.  Filbert Schilder, MD  06/25/2012 10:43 PM         Signed by: Eric Form, MD  Pager: [phone]

## 2012-07-16 NOTE — Plan of Care (Signed)
Problem: Safety  Goal: Patient will be free from injury during hospitalization  Outcome: Progressing  Patient ambulatory in the hallway with steady gait. Patient wearing non-skid footwear. Patient also wearing mask due to neutropenic prec.    Problem: Infection/Potential for Infection  Goal: Free from infection  Outcome: Progressing  Patient remains on neutropenic precautions. WBC= 0.69 today. VS q4hrs stable. Afebrile. On IV abx and antifungal.

## 2012-07-16 NOTE — Progress Notes (Signed)
Attempted to meet with patient upon diagnosis, but patient was off the unit at the time.  Met with her today to offer support and introduce her to [facility] and the resources it offers.  Patient has tolerated her treatment without any difficulties other than fatigue.  She states she is bored but is trying to keep busy by continuing to work.  She is missing exercising and going to the gym, so I have requested some PT to work with patient. Contact information and [facility] literature left with patient.  She states she will not attend any groups in [place] and only wishes to attend groups located in [place].  Will continue to follow as needed.

## 2012-07-16 NOTE — Progress Notes (Signed)
Infectious Diseases & Tropical Medicine  Progress Note    07/16/2012   [name] ZOX:[id number]:[id number] is a 57 y.o. female,       Assessment:     Acute leukemia - s/p firstchemotherapy  Groshong catheter in place  MUGA scan-ejection fraction 79%   Lyme antibody test negative  Parvovirus IgM antibody is negative (IgG antibodies-2.0)  Remains afebrile  Pancytopenia / neutropenia  Elevated LFT's  improving  CXR -small bilateral pleural effusions  Clinically stable    Plan:      Continue ceftazidime   Continue diflucan and acyclovir    Monitor CBC and liver function tests closely   Continue probiotics   Reviewed notes   D/W patient in detail    ROS:     General: no fever, no chills, no rigor,awake and alert, sitting in the chair, feeling good  HEENT: no neck pain, no throat pain  Endocrine:denies fatigue  Respiratory: no cough,no shortness of breath, or wheezing   Cardiovascular: no chest pain   Gastrointestinal: no abdominal pain,no N/V/D  Genito-Urinary: no dysuria, trouble voiding, or hematuria, increased urinary frequency  Musculoskeletal: no edema, myalgias, leg swellling  Neurological: c/o generalized weakness  Dermatological: maculopapular rash on the back resolved, no ulcer    Physical Examination:     Blood pressure 106/55, pulse 58, temperature 99.2 F (37.3 C), temperature source Oral, resp. rate 18, height 1.651 m (5\' 5" ), weight 45.36 kg (100 lb), SpO2 99.00%.     General Appearance: Comfortable, and in no acute distress. Awake,alert, sitting in her chair,no new issues   HEENT: Pupils are equal, round, and reactive to light.    Lungs:  CTA   Heart: bradycardia   Chest: Symmetric chest wall expansion.minimal rash at anterior chest    Abdomen: soft ,non tender,no hepatosplenomegaly   Neurological: No focal deficit   Extremities: No edema    Laboratory And Diagnostic Studies:     Recent Labs   [facility] 07/16/12 0456 07/15/12 0607    WBC 0.69* 0.60*    HGB 9.4* 9.2*    HCT 26.7* 26.2*     PLT 50* 61*     Recent Labs   [facility]ename 07/16/12 0456 07/15/12 0607    NA 139 140    K 4.1 4.0    CL 106 107    CO2 28 28    BUN 14.0 12.0    CREAT 0.6 0.5*    GLU 86 87    CA 9.4 9.1     Recent Labs   Basename 07/16/12 0456 07/15/12 0607    AST 14 15    ALT 108* 136*    ALKPHOS 66 62    PROT 5.3* 5.3*    ALB 2.8* 2.8*       Current Meds:      Scheduled Meds: PRN Meds:           acyclovir 400 mg Oral Q12H SCH   ceftAZIDime 1 g Intravenous Q8H   docusate sodium 100 mg Oral Daily   fluconazole 400 mg Intravenous Q24H [facility]   lactobacillus/streoptococcus 1 capsule Oral Daily   pantoprazole 40 mg Oral QAM AC   polyethylene glycol 17 g Oral Daily       Continuous Infusions:       . sodium chloride 50 mL/hr at 07/16/12 0447   . sodium chloride           sodium chloride 200 mL/hr Continuous PRN   acetaminophen 650 mg Q4H PRN  acetaminophen 650 mg Q4H PRN   ALPRAZolam 0.25 mg QHS PRN   alteplase 2 mg Once PRN   alum & mag hydroxide-simethicone 30 mL Q4H PRN   bisacodyl 5 mg QD PRN   diphenhydrAMINE 50 mg PRN   diphenhydrAMINE-zinc acetate  TID PRN   EPINEPHrine 0.3 mg PRN   EPINEPHrine 0.1 mg PRN   hydrocortisone 100 mg PRN   lactulose 20 g BID PRN   lidocaine  PRN   loperamide 2 mg PRN   LORazepam 1 mg Q4H PRN   Or     LORazepam 1 mg Q4H PRN   Magic Mouthwash 30 mL Q4H PRN   morphine 2 mg Q2H PRN   naloxone 0.2 mg PRN   simethicone 80 mg Q6H PRN   sodium phosphate 1 enema QD PRN   [DISCONTINUED] senna-docusate 1 tablet Q12H PRN         Heleena Miceli A. Janalyn Rouse, M.D.  07/16/2012  9:34 AM

## 2012-07-17 LAB — MAN DIFF ONLY
Band Neutrophils Absolute: 0 (ref 0.00–1.00)
Band Neutrophils: 0 %
Basophils Absolute Manual: 0 (ref 0.00–0.20)
Basophils Manual: 0 %
Eosinophils Absolute Manual: 0 (ref 0.00–0.70)
Eosinophils Manual: 0 %
Lymphocytes Absolute Manual: 0.49 — ABNORMAL LOW (ref 0.50–4.40)
Lymphocytes Manual: 100 %
Monocytes Absolute: 0 (ref 0.00–1.20)
Monocytes Manual: 0 %
Neutrophils Absolute Manual: 0 — ABNORMAL LOW (ref 1.80–8.10)
Nucleated RBC: 0 (ref 0–1)
Segmented Neutrophils: 0 %

## 2012-07-17 LAB — CBC AND DIFFERENTIAL
Hematocrit: 27.1 % — ABNORMAL LOW (ref 37.0–47.0)
Hgb: 9 g/dL — ABNORMAL LOW (ref 12.0–16.0)
MCH: 32 pg (ref 28.0–32.0)
MCHC: 33.2 g/dL (ref 32.0–36.0)
MCV: 96.4 fL (ref 80.0–100.0)
MPV: 10.9 fL (ref 9.4–12.3)
Platelets: 31 — ABNORMAL LOW (ref 140–400)
RBC: 2.81 — ABNORMAL LOW (ref 4.20–5.40)
RDW: 13 % (ref 12–15)
WBC: 0.49 — ABNORMAL LOW (ref 3.50–10.80)

## 2012-07-17 LAB — CELL MORPHOLOGY: Cell Morphology: NORMAL

## 2012-07-17 NOTE — Progress Notes (Signed)
Daily PROGRESS NOTE    Date Time: 07/17/2012 4:57 PM  Patient Name: [name], [name]  Patient Status: Inpatient  Hospital Day: 22    Assessment:   Acute Leukemia, NOS   Neutropenia  .Pancytopenia.   . Weight loss with a body mass index of 18 shows mild to moderate   Malnutrition  . Skin rash.  Epigastric pain  Sinus brady    Plan:   1. Chemo in progress  2. D/cRemote Tele, Heart rate better  3. Neutropenic precaution  4. Prophylactic Abx per ID  5. Monitor CBC, lytes  6. Transfuse RBCs, Platelets as needed  7. Nutrition supplement    Subjective:    Fatigue better today, soreness in the mouth  10 point Review of Systems - Negative except for the Positives mentioned above      Medications:     Current Facility-Administered Medications   Medication Dose Route Frequency   . acyclovir  400 mg Oral Q12H SCH   . ceftAZIDime  1 g Intravenous Q8H   . docusate sodium  100 mg Oral Daily   . fluconazole  400 mg Intravenous Q24H SCH   . lactobacillus/streoptococcus  1 capsule Oral Daily   . Magic Mouthwash  15 mL Oral QID   . pantoprazole  40 mg Oral QAM AC   . polyethylene glycol  17 g Oral Daily          sodium chloride 200 mL/hr Continuous PRN   acetaminophen 650 mg Q4H PRN   acetaminophen 650 mg Q4H PRN   ALPRAZolam 0.25 mg QHS PRN   alteplase 2 mg Once PRN   alum & mag hydroxide-simethicone 30 mL Q4H PRN   bisacodyl 5 mg QD PRN   diphenhydrAMINE 50 mg PRN   diphenhydrAMINE-zinc acetate  TID PRN   EPINEPHrine 0.3 mg PRN   EPINEPHrine 0.1 mg PRN   hydrocortisone 100 mg PRN   lactulose 20 g BID PRN   lidocaine  PRN   loperamide 2 mg PRN   LORazepam 1 mg Q4H PRN   Or     LORazepam 1 mg Q4H PRN   Magic Mouthwash 30 mL Q4H PRN   morphine 2 mg Q2H PRN   naloxone 0.2 mg PRN   simethicone 80 mg Q6H PRN   sodium phosphate 1 enema QD PRN        Physical Exam:     Filed Vitals:    07/17/12 1557   BP: 98/53   Pulse: 79   Temp: 98.7 F (37.1 C)   Resp: 16   SpO2: 98%       Intake and Output Summary (Last 24 hours) at Date  Time    Intake/Output Summary (Last 24 hours) at 07/17/12 1657  Last data filed at 07/16/12 1705   Gross per 24 hour   Intake    900 ml   Output      0 ml   Net    900 ml       Physical Exam   Constitutional: She is oriented to person, place, and time. She appears well-developed.   HENT: No oral lesions seen  Head: Normocephalic and atraumatic.   Eyes: Pupils are equal, round, and reactive to light.   Neck: No JVD present. No tracheal deviation present. No thyromegaly present.   Cardiovascular: Normal rate and normal heart sounds.    Pulmonary/Chest: No respiratory distress. She has no wheezes. She has no rales.   Abdominal: She exhibits no distension. There is  no tenderness. There is no rebound and no guarding.   Musculoskeletal: She exhibits no edema.   Neurological: She is alert and oriented to person, place, and time.           Labs:     Results     Procedure Component Value Units Date/Time    CBC and differential [[id number]]  (Abnormal) Collected:07/17/12 0559    Specimen Information:Blood / Blood Updated:07/17/12 0822     WBC 0.49 (L)      RBC 2.81 (L)      Hgb 9.0 (L) g/dL      Hematocrit 82.9 (L) %      MCV 96.4 fL      MCH 32.0 pg      MCHC 33.2 g/dL      RDW 13 %      Platelets 31 (L)      MPV 10.9 fL     Manual Differential [[id number]]  (Abnormal) Collected:07/17/12 0559     Segmented Neutrophils 0 % Updated:07/17/12 0822     Band Neutrophils 0 %      Lymphocytes Manual 100 %      Monocytes Manual 0 %      Eosinophils Manual 0 %      Basophils Manual 0 %      Nucleated RBC 0      Abs Seg Manual 0.00 (L)      Bands Absolute 0.00      Absolute Lymph Manual 0.49 (L)      Monocytes Absolute 0.00      Absolute Eos Manual 0.00      Absolute Baso Manual 0.00     Cell MorpHology [[id number]] Collected:07/17/12 0559     Cell Morphology: Normal Updated:07/17/12 0822            Rads:     Xr Chest 2 Views    06/26/2012   CLINICAL INDICATION: pneumonia  COMPARISON: None available  FINDINGS:   2 views of the chest were  obtained. The lungs are clear. The heart and vascularity are within normal limits.  There is no pleural thickening or effusion. The osseous structures are unremarkable.      06/26/2012   No active cardiopulmonary disease   Heron Nay, MD  06/26/2012 11:12 PM     Chest 2 Views    06/25/2012  CLINICAL INDICATION: Fever  COMPARISON: None.  INTERPRETATION: Frontal and lateral views of the chest obtained. Cardiomediastinal contour within normal limits for age. Clear lungs and sharp sulci.           06/25/2012   No acute cardiopulmonary process.  Filbert Schilder, MD  06/25/2012 10:43 PM         Signed by: Eric Form, MD  Pager: [phone]

## 2012-07-17 NOTE — Plan of Care (Signed)
Problem: Infection/Potential for Infection  Goal: Free from infection  Outcome: Progressing  Labs. Monitored and neutropenic prec. Measures maintained.v/s monitored.remains afebrile with no s/s of distress noted.will cont. To monitor pt. Plan of care and treatments closely.

## 2012-07-17 NOTE — Progress Notes (Signed)
HEMATOLOGY ONCOLOGY PROGRESS NOTE    Date Time: 07/17/2012 9:53 AM  Patient Name: [name],[name]      IMPRESSION:     Patient Active Problem List   Diagnosis   . Chondromalacia of patella   . Neutropenic fever   -AML -M0      PLAN: Doing well.  No complaints.  Day 14 marrow likely on Tuesday of next week.  BM yesterday.  Platelets are lower today but not at transfusion level.  Continue to monitor.             HISTORY: [name] is a 57 year old who was in her usual state of excellent health until approximately 5 days prior to admission when she began to experience arthralgias, myalgias, and some rhinorrhea. She was noted to have temperature   elevations to as high as 102 and 103 degrees. 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 Cytogenetics revealed a normal 46XX chromosomal pattern. She is bcr/abl negative and FISH is pending.   She started 7+3 07/06/12. Today is day 7.  Interval History:  She continues to ambulate without difficulty. No complaints except for constipation for two days.         PHYSICAL EXAM:     Filed Vitals:    07/17/12 0953   BP: 88/53   Pulse: 90   Temp: 98.2 F (36.8 C)   Resp: 16   SpO2: 97%       Intake and Output Summary (Last 24 hours) at Date Time    Intake/Output Summary (Last 24 hours) at 07/17/12 0953  Last data filed at 07/16/12 1705   Gross per 24 hour   Intake    900 ml   Output      0 ml   Net    900 ml       Appearance: in no acute distress  HEENT: pharynx clear  Lungs: clear to ausculation  Heart: regular rate and rhythm, normal heart sounds  Abdomen: flat and soft, active bowel sounds  Extremities: no edema  Neuro: alert, appropriate  MEDS:   Scheduled Meds:  Current Facility-Administered Medications   Medication Dose Route Frequency   . acyclovir  400 mg Oral Q12H SCH   . ceftAZIDime  1 g Intravenous Q8H   . docusate sodium  100 mg Oral Daily   . fluconazole  400 mg Intravenous Q24H SCH   . lactobacillus/streoptococcus  1 capsule Oral Daily   .  Magic Mouthwash  15 mL Oral QID   . pantoprazole  40 mg Oral QAM AC   . polyethylene glycol  17 g Oral Daily     Continuous Infusions:       . sodium chloride 50 mL/hr at 07/17/12 0354   . sodium chloride       PRN Meds:.sodium chloride, acetaminophen, acetaminophen, ALPRAZolam, alteplase, alum & mag hydroxide-simethicone, bisacodyl, diphenhydrAMINE, diphenhydrAMINE-zinc acetate, EPINEPHrine, EPINEPHrine, hydrocortisone, lactulose, lidocaine, loperamide, LORazepam, LORazepam, Magic Mouthwash, morphine, naloxone, simethicone, sodium phosphate    LABS:     Results     Procedure Component Value Units Date/Time    CBC and differential [[id number]]  (Abnormal) Collected:07/17/12 0559    Specimen Information:Blood / Blood Updated:07/17/12 0822     WBC 0.49 (L)      RBC 2.81 (L)      Hgb 9.0 (L) g/dL      Hematocrit 52.8 (L) %      MCV 96.4 fL      MCH 32.0 pg  MCHC 33.2 g/dL      RDW 13 %      Platelets 31 (L)      MPV 10.9 fL     Manual Differential [[id number]]  (Abnormal) Collected:07/17/12 0559     Segmented Neutrophils 0 % Updated:07/17/12 0822     Band Neutrophils 0 %      Lymphocytes Manual 100 %      Monocytes Manual 0 %      Eosinophils Manual 0 %      Basophils Manual 0 %      Nucleated RBC 0      Abs Seg Manual 0.00 (L)      Bands Absolute 0.00      Absolute Lymph Manual 0.49 (L)      Monocytes Absolute 0.00      Absolute Eos Manual 0.00      Absolute Baso Manual 0.00     Cell MorpHology [[id number]] Collected:07/17/12 0559     Cell Morphology: Normal Updated:07/17/12 0822          IMAGING DATA:  Xr Chest 2 Views    06/26/2012   CLINICAL INDICATION: pneumonia  COMPARISON: None available  FINDINGS:   2 views of the chest were obtained. The lungs are clear. The heart and vascularity are within normal limits.  There is no pleural thickening or effusion. The osseous structures are unremarkable.      06/26/2012   No active cardiopulmonary disease   Heron Nay, MD  06/26/2012 11:12 PM     Chest 2  Views    06/25/2012  CLINICAL INDICATION: Fever  COMPARISON: None.  INTERPRETATION: Frontal and lateral views of the chest obtained. Cardiomediastinal contour within normal limits for age. Clear lungs and sharp sulci.           06/25/2012   No acute cardiopulmonary process.  Mitali  Bapna, MD  06/25/2012 10:43 PM     Ct Sinus Facial Bones Wo Contrast    06/28/2012  HISTORY: Sinusitis  COMPARISON: None.  TECHNIQUE: Axial CT scans slices of the paranasal sinuses and facial bones with sagittal and coronal reformats  FINDINGS: No fracture or other bony abnormality. The sinuses are clear. The ostiomeatal complexes are patent. There is no mass or nasopharynx. There is a left concha bullosa. The nasal septum is midline.      06/28/2012   Normal  Lynnae Prude, MD  06/28/2012 2:42 PM     Ct Chest Wo Contrast    06/28/2012  HISTORY: Cough  COMPARISON: None  TECHNIQUE:  Helical CT scan of the chest was obtained from the apices to the lung bases without intravenous contrast.      FINDINGS:  Lungs and central airways: Atelectasis or scarring in the lower lobes. Pleura: Trace effusions bilaterally. Aorta and Great Vessels: Within normal limits. Heart: No pericardial effusion. No coronary artery calcification. Mediastinum and hila: No mass or adenopathy Upper Abdomen: No significant abnormality. Bones: No significant abnormality. Chest wall and lower neck: Normal       06/28/2012   Atelectasis or scarring in the lower lobes. Trace bilateral pleural effusions.  Lynnae Prude, MD  06/28/2012 2:47 PM     Nm Cardiac Muga (at Rest)    07/02/2012  CLINICAL INDICATION: Prechemotherapy evaluation. Leukemia.  TECHNIQUE: A dynamic MUGA scan was obtained. Imaging obtained in the LAO, lateral, and anterior projections. The left ventricle region of interest was drawn, on the best separation view, in end-diastole and end-systole, and ejection fraction was  calculated. 23.4 mCi of Tc73m labeled autologous RBCs, using the Ultratag technique was administered.   FINDINGS: Normal wall motion is seen in the left ventricle. The ejection fraction is calculated at 79%, which is normal.      07/02/2012   NORMAL EJECTION FRACTION, OF 79%.  Darnelle Maffucci, MD  07/02/2012 2:23 PM     Biopsy, Bone Marrow    06/29/2012  EXAMINATION: Fluoroscopic guided bone marrow aspirate and core biopsy.  INTERVENTIONALIST: Verlee Rossetti, MD.  HISTORY:  Patient is a 57 year old female with pancytopenia and neutropenic fever.  Anesthesia: Moderate sedation with Fentanyl and Versed was administered with appropriate physiologic monitoring using an independent trained observer for 30 minutes. 1% lidocaine local was administered as well.  TECHNIQUE: With the patient in the prone position the skin over the left iliac crest was prepped and draped in usual sterile fashion. Local anesthesia was applied to superficial and deep tissues with 2% lidocaine. Subsequently, under direct fluoroscopic guidance, a bone marrow needle was advanced to the posteromedial aspect of the superior left iliac crest. The outer cortex was traversed and bone marrow aspiration performed and the needle removed.    The bone marrow core needle was then advanced to a separate site on the iliac crest to obtain a core of bone marrow tissue. The samples were given to hematology for analysis. The patient tolerated procedure well without apparent competition.  INTERPRETATION: Fluoroscopy revealed successful access to the left iliac crest marrow cavity for bone marrow aspiration and for core biopsy.  Fluoroscopy Time: 0.3 min      06/29/2012   Technically successful fluoroscopic guided bone marrow aspirate and core biopsy for pancytopenia.  Larrie Kass, MD  06/29/2012 4:21 PM     Tunneled Cath Placement (permcath)    07/03/2012   Clinical history: Power line  Interventionalist: Dara Lords, MD  Informed consent obtained and all questions answered.  Anesthesia:  Moderate sedation with appropriate monitoring plus local. This consisted of Versed  and fentanyl administration with continuous hemodynamic monitoring with a trained observer. Total anesthesia time with direct physician supervision 20 minutes.  Procedure: After standard prepping and draping with maximal sterile barrier technique and local anesthesia with 2% Xylocaine, percutaneous puncture of the right internal jugular vein is performed under ultrasound guidance with [place] image obtained for the medical record. The guidewire is advanced under fluoroscopic guidance. Dilator is advanced over the wire and left in place and flushed.  A double-lumen power line cuffed catheter is tunneled from the venotomy site to an exit site on the right anterior chest wall after prior infiltration of the right anterior chest wall with Xylocaine plus epinephrine.  The dilator at the venotomy site is exchanged for a peel-away sheath. The double-lumen power line (equivalent to Groshong) catheter is advanced through the peel-away sheath. The peel-away sheath was removed. The catheter length is adjusted to position the catheter tip in the superior vena cava.  The catheter secured in place with 2-0 Prolene suture. The venotomy site was closed with  Dermabond.  Complications: None apparent.  Fluoroscopy time:0.3 minutes  Findings: The right internal jugular vein is patent by US imaging. An [place] image is obtained for the medical record. Catheter tip is in the superior vena cava.      07/03/2012    successful ultrasound and fluoroscopically guided placement of a right internal jugular dual-lumen cuffed power line catheter. The catheter may be used immediately.  Dara Lords, MD  07/03/2012 2:42 PM  Valetta Mole, MD  [facility]    [phone]

## 2012-07-17 NOTE — Progress Notes (Signed)
Infectious Diseases & Tropical Medicine  Progress Note    07/17/2012   [name] ZOX:[id number]:[id number] is a 57 y.o. female,       Assessment:     Acute leukemia - s/p firstchemotherapy  Groshong catheter in place  MUGA scan-ejection fraction 79%   Lyme antibody test negative  Parvovirus IgM antibody is negative (IgG antibodies-2.0)  Remains afebrile  Pancytopenia / neutropenia  Elevated LFT's  improving  CXR -small bilateral pleural effusions  Clinically stable    Plan:      Continue ceftazidime   Continue diflucan and acyclovir    Monitor CBC and liver function tests closely   Continue probiotics   Reviewed notes   D/W patient in detail    ROS:     General: no fever, no chills, no rigor,awake and alert,feeling well,no new issues  HEENT: no neck pain, no throat pain  Endocrine:denies fatigue  Respiratory: no cough,no shortness of breath, or wheezing   Cardiovascular: no chest pain   Gastrointestinal: no abdominal pain,no N/V/D  Genito-Urinary: no dysuria, trouble voiding, or hematuria, increased urinary frequency  Musculoskeletal: no edema, myalgias, leg swellling  Neurological: c/o generalized weakness  Dermatological: maculopapular rash on the back resolved, no ulcer    Physical Examination:     Blood pressure 110/59, pulse 75, temperature 98.2 F (36.8 C), temperature source Oral, resp. rate 18, height 1.651 m (5\' 5" ), weight 45.36 kg (100 lb), SpO2 95.00%.     General Appearance: Comfortable, and in no acute distress. Awake,alert   HEENT: Pupils are equal, round, and reactive to light.    Lungs:  CTA   Heart: bradycardia   Chest: Symmetric chest wall expansion.minimal rash at anterior chest    Abdomen: soft ,non tender,no hepatosplenomegaly   Neurological: No focal deficit   Extremities: No edema    Laboratory And Diagnostic Studies:     Recent Labs   [name] 07/17/12 0559 07/16/12 0456    WBC 0.49* 0.69*    HGB 9.0* 9.4*    HCT 27.1* 26.7*    PLT 31* 50*     Recent Labs   [name]  07/16/12 0456 07/15/12 0607    NA 139 140    K 4.1 4.0    CL 106 107    CO2 28 28    BUN 14.0 12.0    CREAT 0.6 0.5*    GLU 86 87    CA 9.4 9.1     Recent Labs   Basename 07/16/12 0456 07/15/12 0607    AST 14 15    ALT 108* 136*    ALKPHOS 66 62    PROT 5.3* 5.3*    ALB 2.8* 2.8*       Current Meds:      Scheduled Meds: PRN Meds:           acyclovir 400 mg Oral Q12H SCH   ceftAZIDime 1 g Intravenous Q8H   docusate sodium 100 mg Oral Daily   fluconazole 400 mg Intravenous Q24H SCH   lactobacillus/streoptococcus 1 capsule Oral Daily   Magic Mouthwash 15 mL Oral QID   pantoprazole 40 mg Oral QAM AC   polyethylene glycol 17 g Oral Daily       Continuous Infusions:       . sodium chloride 50 mL/hr at 07/17/12 0354   . sodium chloride           sodium chloride 200 mL/hr Continuous PRN   acetaminophen 650 mg Q4H PRN   acetaminophen  650 mg Q4H PRN   ALPRAZolam 0.25 mg QHS PRN   alteplase 2 mg Once PRN   alum & mag hydroxide-simethicone 30 mL Q4H PRN   bisacodyl 5 mg QD PRN   diphenhydrAMINE 50 mg PRN   diphenhydrAMINE-zinc acetate  TID PRN   EPINEPHrine 0.3 mg PRN   EPINEPHrine 0.1 mg PRN   hydrocortisone 100 mg PRN   lactulose 20 g BID PRN   lidocaine  PRN   loperamide 2 mg PRN   LORazepam 1 mg Q4H PRN   Or     LORazepam 1 mg Q4H PRN   Magic Mouthwash 30 mL Q4H PRN   morphine 2 mg Q2H PRN   naloxone 0.2 mg PRN   simethicone 80 mg Q6H PRN   sodium phosphate 1 enema QD PRN   [DISCONTINUED] senna-docusate 1 tablet Q12H PRN         [name], M.D.  07/17/2012  8:50 AM

## 2012-07-17 NOTE — Plan of Care (Signed)
Problem: Infection/Potential for Infection  Goal: Free from infection  Outcome: Progressing  Patient remains on neutropenic precautions. WBC=0.49. Patient receiving abx, and IV antifungal. Patient refused MMW due to "numbing" feeling. Order Cochiti'd.    Comments:   Patient ambulatory in hallway about 3x today. No complaints of pain or discomfort noted.

## 2012-07-18 LAB — MAN DIFF ONLY
Band Neutrophils Absolute: 0 (ref 0.00–1.00)
Band Neutrophils: 0 %
Basophils Absolute Manual: 0 (ref 0.00–0.20)
Basophils Manual: 0 %
Eosinophils Absolute Manual: 0 (ref 0.00–0.70)
Eosinophils Manual: 0 %
Lymphocytes Absolute Manual: 0.26 — ABNORMAL LOW (ref 0.50–4.40)
Lymphocytes Manual: 97 %
Monocytes Absolute: 0.01 (ref 0.00–1.20)
Monocytes Manual: 3 %
Neutrophils Absolute Manual: 0 — ABNORMAL LOW (ref 1.80–8.10)
Nucleated RBC: 0 (ref 0–1)
Segmented Neutrophils: 0 %

## 2012-07-18 LAB — CELL MORPHOLOGY: Cell Morphology: NORMAL

## 2012-07-18 LAB — CBC AND DIFFERENTIAL
Hematocrit: 25.8 % — ABNORMAL LOW (ref 37.0–47.0)
Hgb: 8.8 g/dL — ABNORMAL LOW (ref 12.0–16.0)
MCH: 32.6 pg — ABNORMAL HIGH (ref 28.0–32.0)
MCHC: 34.1 g/dL (ref 32.0–36.0)
MCV: 95.6 fL (ref 80.0–100.0)
MPV: 11 fL (ref 9.4–12.3)
Platelets: 9 — CL (ref 140–400)
RBC: 2.7 — ABNORMAL LOW (ref 4.20–5.40)
RDW: 13 % (ref 12–15)
WBC: 0.27 — ABNORMAL LOW (ref 3.50–10.80)

## 2012-07-18 MED ORDER — SODIUM CHLORIDE 0.9 % IV MBP
1.0000 g | Freq: Three times a day (TID) | INTRAVENOUS | Status: DC
Start: 2012-07-18 — End: 2012-07-19
  Administered 2012-07-18 – 2012-07-19 (×3): 1 g via INTRAVENOUS
  Filled 2012-07-18 (×4): qty 1

## 2012-07-18 MED ORDER — MEROPENEM 500 MG IV SOLR
500.00 mg | Freq: Three times a day (TID) | INTRAVENOUS | Status: DC
Start: 2012-07-18 — End: 2012-07-18
  Administered 2012-07-18: 500 mg via INTRAVENOUS
  Filled 2012-07-18 (×2): qty 0.5

## 2012-07-18 MED ORDER — VANCOMYCIN HCL 1000 MG IV SOLR
750.0000 mg | Freq: Two times a day (BID) | INTRAVENOUS | Status: DC
Start: 2012-07-19 — End: 2012-07-20
  Administered 2012-07-19 (×2): 750 mg via INTRAVENOUS
  Filled 2012-07-18 (×4): qty 750

## 2012-07-18 MED ORDER — VANCOMYCIN HCL 1000 MG IV SOLR
1000.0000 mg | Freq: Once | INTRAVENOUS | Status: AC
Start: 2012-07-18 — End: 2012-07-18
  Administered 2012-07-18: 1000 mg via INTRAVENOUS
  Filled 2012-07-18: qty 1000

## 2012-07-18 MED ORDER — METRONIDAZOLE IN NACL 500 MG/100 ML IV SOLN
500.0000 mg | Freq: Three times a day (TID) | INTRAVENOUS | Status: DC
Start: 2012-07-18 — End: 2012-07-19
  Administered 2012-07-18 – 2012-07-19 (×3): 500 mg via INTRAVENOUS
  Filled 2012-07-18 (×3): qty 100

## 2012-07-18 NOTE — Plan of Care (Signed)
Problem: Infection/Potential for Infection  Goal: Free from infection  Outcome: Progressing  Patient called nurse and states she was not feeling well. On assessement patient noted with fever of 102.2. She is pale. She denies chills. No cough. No wheezes. She has frequent urination. She denies burning on urination  Intervention: Utilize isolation precautions.  Continues on neutropenic precautions      Comments:   Dr. Janalyn Rouse informed of elevated temperature. 2 blood cultures sent. Tylenol given. Vancomycin and Meropenem. IVF. Education completed with patient regarding signs and symptoms of infection and to report to nurse. Patient alos noted with dropping platelets. Platelet level is 9 at this time. She denies active bleeding. Dr. Bing Matter aware. See orders for platelet transfusion. Report given to [facility].

## 2012-07-18 NOTE — Plan of Care (Signed)
Problem: Protective Precautions [in description add neutropenic precautions]  Goal: Free from nosocomial infections  Outcome: Progressing  Pt Alert and Oriented x3.  Denies having pain at present.  Pt had tempeture of 100 at 1900 pt was given 2 tabs of Tylenol, acyclovir and Fortaz.  Dr Mylo Red  Was notified due to pt being Neutropenic and order was given by Dr Mylo Red to do Blood culture x2 if Tempeture elevate to 101.   Will continue to monitor pt's vital sign.

## 2012-07-18 NOTE — Progress Notes (Signed)
Nutritional Support Services  Nutrition Assessment    Jessica SWARTZENDRUBER 57 y.o. female   MRN: [id number]       Asked to see patient by CTY due to patient requesting a milkshake and fruit smoothie due to poor appetite.  Pt c/o poor appetite due to low grade fever but able to drink fruit smoothie.   Recommend to add fruit smoothie with greek yogurt and milk at lunch and vanilla ensure with vanilla ice cream for dinner to maximize PO.  RD will follow as high mod risk.    Royetta Car, MS, RD, CDE  [phone] (office)  [phone] (spectra link)

## 2012-07-18 NOTE — Progress Notes (Signed)
Daily PROGRESS NOTE    Date Time: 07/18/2012 8:58 PM  Patient Name: [name], [name]  Patient Status: Inpatient  Hospital Day: 23    Assessment:   Acute Leukemia, NOS   Neutropenic fever  .Pancytopenia.   .Weight loss with a body mass index of 18 shows mild to moderate   Malnutrition  . Skin rash.  Epigastric pain  Sinus brady    Plan:   1. Chemo in progress  2. D/cRemote Tele, Heart rate better  3. Neutropenic precaution  4. Prophylactic Abx per ID  5. Blood cultures, Vancomycin, acyclovir, diflucan  6. Transfuse RBCs, Platelets as needed  7. Nutrition supplement    Subjective:    Fatigue better today, soreness in the mouth, Fever 102  10 point Review of Systems - Negative except for the Positives mentioned above      Medications:     Current Facility-Administered Medications   Medication Dose Route Frequency   . acyclovir  400 mg Oral Q12H SCH   . docusate sodium  100 mg Oral Daily   . fluconazole  400 mg Intravenous Q24H SCH   . lactobacillus/streoptococcus  1 capsule Oral Daily   . meropenem  500 mg Intravenous Q8H   . pantoprazole  40 mg Oral QAM AC   . polyethylene glycol  17 g Oral Daily   . [COMPLETED] vancomycin  1,000 mg Intravenous Once   . [START ON 07/19/2012] vancomycin  750 mg Intravenous Q12H   . [DISCONTINUED] ceftAZIDime  1 g Intravenous Q8H          sodium chloride 200 mL/hr Continuous PRN   acetaminophen 650 mg Q4H PRN   acetaminophen 650 mg Q4H PRN   ALPRAZolam 0.25 mg QHS PRN   alteplase 2 mg Once PRN   alum & mag hydroxide-simethicone 30 mL Q4H PRN   bisacodyl 5 mg QD PRN   diphenhydrAMINE 50 mg PRN   diphenhydrAMINE-zinc acetate  TID PRN   EPINEPHrine 0.3 mg PRN   EPINEPHrine 0.1 mg PRN   hydrocortisone 100 mg PRN   lactulose 20 g BID PRN   lidocaine  PRN   loperamide 2 mg PRN   LORazepam 1 mg Q4H PRN   Or     LORazepam 1 mg Q4H PRN   Magic Mouthwash 30 mL Q4H PRN   morphine 2 mg Q2H PRN   naloxone 0.2 mg PRN   simethicone 80 mg Q6H PRN   sodium phosphate 1 enema QD PRN        Physical  Exam:     Filed Vitals:    07/18/12 1746   BP: 113/56   Pulse: 95   Temp: 102.5 F (39.2 C)   Resp: 18   SpO2: 96%       Intake and Output Summary (Last 24 hours) at Date Time  No intake or output data in the 24 hours ending 07/18/12 2058    Physical Exam   Constitutional: She is oriented to person, place, and time. She appears well-developed.   HENT: No oral lesions seen  Head: Normocephalic and atraumatic.   Eyes: Pupils are equal, round, and reactive to light.   Neck: No JVD present. No tracheal deviation present. No thyromegaly present.   Cardiovascular: Normal rate and normal heart sounds.    Pulmonary/Chest: No respiratory distress. She has no wheezes. She has no rales.   Abdominal: She exhibits no distension. There is no tenderness. There is no rebound and no guarding.   Musculoskeletal: She exhibits  no edema.   Neurological: She is alert and oriented to person, place, and time.           Labs:     Results     Procedure Component Value Units Date/Time    Manual Differential [[id number]]  (Abnormal) Collected:07/18/12 1815     Segmented Neutrophils 0 % Updated:07/18/12 1921     Band Neutrophils 0 %      Lymphocytes Manual 97 %      Monocytes Manual 3 %      Eosinophils Manual 0 %      Basophils Manual 0 %      Nucleated RBC 0      Abs Seg Manual 0.00 (L)      Bands Absolute 0.00      Absolute Lymph Manual 0.26 (L)      Monocytes Absolute 0.01      Absolute Eos Manual 0.00      Absolute Baso Manual 0.00     Cell MorpHology [[id number]] Collected:07/18/12 1815     Cell Morphology: Normal Updated:07/18/12 1921    CBC and differential [[id number]]  (Abnormal) Collected:07/18/12 1815    Specimen Information:Blood / Blood Updated:07/18/12 1921     WBC 0.27 (L)      RBC 2.70 (L)      Hgb 8.8 (L) g/dL      Hematocrit 24.4 (L) %      MCV 95.6 fL      MCH 32.6 (H) pg      MCHC 34.1 g/dL      RDW 13 %      Platelets 9 (LL)      MPV 11.0 fL     Blood culture [[id number]] Collected:07/18/12 1832    Specimen Information:Blood  / Blood Updated:07/18/12 1832    Narrative:    8ml required    Blood culture [[id number]] Collected:07/18/12 1815    Specimen Information:Blood / Blood Updated:07/18/12 1815    Narrative:    8ml required            Rads:     Xr Chest 2 Views    06/26/2012   CLINICAL INDICATION: pneumonia  COMPARISON: None available  FINDINGS:   2 views of the chest were obtained. The lungs are clear. The heart and vascularity are within normal limits.  There is no pleural thickening or effusion. The osseous structures are unremarkable.      06/26/2012   No active cardiopulmonary disease   Heron Nay, MD  06/26/2012 11:12 PM     Chest 2 Views    06/25/2012  CLINICAL INDICATION: Fever  COMPARISON: None.  INTERPRETATION: Frontal and lateral views of the chest obtained. Cardiomediastinal contour within normal limits for age. Clear lungs and sharp sulci.           06/25/2012   No acute cardiopulmonary process.  Filbert Schilder, MD  06/25/2012 10:43 PM         Signed by: Eric Form, MD  Pager: [phone]

## 2012-07-18 NOTE — Progress Notes (Addendum)
[facility] Diseases [facility]  Progress Note    07/18/2012   Kyanna Mahrt ZOX:[id number],XBJ:[id number] is a 57 y.o. female,       Assessment:     Acute leukemia - s/p first chemotherapy  Groshong catheter in place  Pancytopenia / neutropenia  Elevated LFT's  improving  CXR -small bilateral pleural effusions  Fever     Plan:      Started on vancomycin and meropenem today evening   Developed itching    Discontinue meropenem   Restart ceftazidime   Continue vancomycin as patient has Groshong catheter   Add flagyl for anaerobic coverage   Continue diflucan and acyclovir    Continue probiotics   Reviewed notes   D/W patient in detail   D/W Dr Mylo Red    ROS:     General:  Fever spikes, no chills, no rigor,awake and alert,feeling tired  HEENT: no neck pain, no throat pain  Endocrine:c/o fatigue  Respiratory: no cough,no shortness of breath, or wheezing   Cardiovascular: no chest pain   Gastrointestinal: no abdominal pain,no N/V/D  Genito-Urinary: no dysuria, trouble voiding, or hematuria  Musculoskeletal: no edema, myalgias, leg swellling  Neurological: c/o generalized weakness  Dermatological: no rash, no ulcer    Physical Examination:     Blood pressure 112/64, pulse 72, temperature 100.3 F (37.9 C), temperature source Oral, resp. rate 18, height 1.651 m (5\' 5" ), weight 45.36 kg (100 lb), SpO2 97.00%.     General Appearance: Comfortable, and in no acute distress. Awake,alert,sitting in the chair   HEENT: Pupils are equal, round, and reactive to light.    Lungs:  CTA   Heart: bradycardia   Chest: Symmetric chest wall expansion.minimal rash at anterior chest    Abdomen: soft ,non tender,no hepatosplenomegaly   Neurological: No focal deficit   Extremities: No edema    Laboratory And Diagnostic Studies:     Recent Labs   [facility] 07/17/12 0559 07/16/12 0456    WBC 0.49* 0.69*    HGB 9.0* 9.4*    HCT 27.1* 26.7*    PLT 31* 50*     Recent Labs   [facility] 07/16/12 0456    NA 139    K 4.1    CL 106     CO2 28    BUN 14.0    CREAT 0.6    GLU 86    CA 9.4     Recent Labs   [facility] 07/16/12 0456    AST 14    ALT 108*    ALKPHOS 66    PROT 5.3*    ALB 2.8*       Current Meds:      Scheduled Meds: PRN Meds:           acyclovir 400 mg Oral Q12H SCH   ceftAZIDime 1 g Intravenous Q8H   docusate sodium 100 mg Oral Daily   fluconazole 400 mg Intravenous Q24H SCH   lactobacillus/streoptococcus 1 capsule Oral Daily   pantoprazole 40 mg Oral QAM AC   polyethylene glycol 17 g Oral Daily   [DISCONTINUED] Magic Mouthwash 15 mL Oral QID       Continuous Infusions:       . sodium chloride 50 mL/hr at 07/18/12 0425   . sodium chloride           sodium chloride 200 mL/hr Continuous PRN   acetaminophen 650 mg Q4H PRN   acetaminophen 650 mg Q4H PRN   ALPRAZolam 0.25 mg QHS PRN  alteplase 2 mg Once PRN   alum & mag hydroxide-simethicone 30 mL Q4H PRN   bisacodyl 5 mg QD PRN   diphenhydrAMINE 50 mg PRN   diphenhydrAMINE-zinc acetate  TID PRN   EPINEPHrine 0.3 mg PRN   EPINEPHrine 0.1 mg PRN   hydrocortisone 100 mg PRN   lactulose 20 g BID PRN   lidocaine  PRN   loperamide 2 mg PRN   LORazepam 1 mg Q4H PRN   Or     LORazepam 1 mg Q4H PRN   Magic Mouthwash 30 mL Q4H PRN   morphine 2 mg Q2H PRN   naloxone 0.2 mg PRN   simethicone 80 mg Q6H PRN   sodium phosphate 1 enema QD PRN         Hurbert Duran A. Janalyn Rouse, M.D.  07/18/2012  9:47 AM

## 2012-07-19 LAB — CBC AND DIFFERENTIAL
Basophils Absolute Automated: 0 (ref 0.00–0.20)
Basophils Automated: 0 %
Eosinophils Absolute Automated: 0 (ref 0.00–0.70)
Eosinophils Automated: 0 %
Hematocrit: 24.8 % — ABNORMAL LOW (ref 37.0–47.0)
Hgb: 8.5 g/dL — ABNORMAL LOW (ref 12.0–16.0)
Immature Granulocytes Absolute: 0
Immature Granulocytes: 0 %
Lymphocytes Absolute Automated: 0.27 — ABNORMAL LOW (ref 0.50–4.40)
Lymphocytes Automated: 100 %
MCH: 33.1 pg — ABNORMAL HIGH (ref 28.0–32.0)
MCHC: 34.3 g/dL (ref 32.0–36.0)
MCV: 96.5 fL (ref 80.0–100.0)
MPV: 9.9 fL (ref 9.4–12.3)
Monocytes Absolute Automated: 0 (ref 0.00–1.20)
Monocytes: 0 %
Neutrophils Absolute: 0 — ABNORMAL LOW (ref 1.80–8.10)
Neutrophils: 0 %
Nucleated RBC: 0 (ref 0–1)
Platelets: 49 — ABNORMAL LOW (ref 140–400)
RBC: 2.57 — ABNORMAL LOW (ref 4.20–5.40)
RDW: 13 % (ref 12–15)
WBC: 0.27 — ABNORMAL LOW (ref 3.50–10.80)

## 2012-07-19 LAB — GFR: EGFR: >60

## 2012-07-19 LAB — COMPREHENSIVE METABOLIC PANEL
ALT: 77 U/L — ABNORMAL HIGH (ref 0–55)
AST (SGOT): 19 U/L (ref 5–34)
Albumin/Globulin Ratio: 1 (ref 0.9–2.2)
Albumin: 2.8 g/dL — ABNORMAL LOW (ref 3.5–5.0)
Alkaline Phosphatase: 63 U/L (ref 40–150)
Anion Gap: 6 (ref 5.0–15.0)
BUN: 7 mg/dL (ref 7.0–19.0)
Bilirubin, Total: 0.8 mg/dL (ref 0.2–1.2)
CO2: 27 (ref 22–29)
Calcium: 9.2 mg/dL (ref 8.5–10.5)
Chloride: 106 (ref 98–107)
Creatinine: 0.5 mg/dL — ABNORMAL LOW (ref 0.6–1.0)
Globulin: 2.7 g/dL (ref 2.0–3.6)
Glucose: 101 mg/dL — ABNORMAL HIGH (ref 70–100)
Potassium: 3.9 (ref 3.5–5.1)
Protein, Total: 5.5 g/dL — ABNORMAL LOW (ref 6.0–8.3)
Sodium: 139 (ref 136–145)

## 2012-07-19 LAB — PREPARE PLATELETS: Platelet Product-: TRANSFUSED

## 2012-07-19 LAB — HEMOLYSIS INDEX: Hemolysis Index: 9 (ref 0–18)

## 2012-07-19 MED ORDER — SODIUM CHLORIDE 0.9 % IV MBP
500.0000 mg | Freq: Three times a day (TID) | INTRAVENOUS | Status: DC
Start: 2012-07-19 — End: 2012-07-22
  Administered 2012-07-19 – 2012-07-22 (×9): 500 mg via INTRAVENOUS
  Filled 2012-07-19 (×10): qty 0.5

## 2012-07-19 NOTE — Progress Notes (Signed)
Daily PROGRESS NOTE    Date Time: 07/19/2012 12:37 PM  Patient Name: [name], [name]  Patient Status: Inpatient  Hospital Day: 24    Assessment:   Acute Leukemia, NOS   Neutropenic fever  .Pancytopenia.   .Weight loss with a body mass index of 18 shows mild to moderate   Malnutrition  . Skin rash.  Epigastric pain  Sinus brady    Plan:   1. Chemo in progress  2. Transfused Platelets  3. Neutropenic precaution  4. Prophylactic Abx per ID  5. Fortaz, Flagyl, Vancomycin, acyclovir, diflucan  6. Transfuse RBCs, Platelets as needed  7. Nutrition supplement    Subjective:    Fatigue better today, Rash with ?Merrem  10 point Review of Systems - Negative except for the Positives mentioned above      Medications:     Current Facility-Administered Medications   Medication Dose Route Frequency   . acyclovir  400 mg Oral Q12H SCH   . ceftAZIDime  1 g Intravenous Q8H SCH   . docusate sodium  100 mg Oral Daily   . fluconazole  400 mg Intravenous Q24H SCH   . lactobacillus/streoptococcus  1 capsule Oral Daily   . metroNIDAZOLE  500 mg Intravenous Q8H SCH   . pantoprazole  40 mg Oral QAM AC   . polyethylene glycol  17 g Oral Daily   . [COMPLETED] vancomycin  1,000 mg Intravenous Once   . vancomycin  750 mg Intravenous Q12H   . [DISCONTINUED] ceftAZIDime  1 g Intravenous Q8H   . [DISCONTINUED] meropenem  500 mg Intravenous Q8H          sodium chloride 200 mL/hr Continuous PRN   acetaminophen 650 mg Q4H PRN   acetaminophen 650 mg Q4H PRN   ALPRAZolam 0.25 mg QHS PRN   alteplase 2 mg Once PRN   alum & mag hydroxide-simethicone 30 mL Q4H PRN   bisacodyl 5 mg QD PRN   diphenhydrAMINE 50 mg PRN   diphenhydrAMINE-zinc acetate  TID PRN   EPINEPHrine 0.3 mg PRN   EPINEPHrine 0.1 mg PRN   hydrocortisone 100 mg PRN   lactulose 20 g BID PRN   lidocaine  PRN   loperamide 2 mg PRN   LORazepam 1 mg Q4H PRN   Or     LORazepam 1 mg Q4H PRN   Magic Mouthwash 30 mL Q4H PRN   morphine 2 mg Q2H PRN   naloxone 0.2 mg PRN   simethicone 80 mg Q6H  PRN   sodium phosphate 1 enema QD PRN        Physical Exam:     Filed Vitals:    07/19/12 1104   BP: 100/53   Pulse: 89   Temp:    Resp:    SpO2:        Intake and Output Summary (Last 24 hours) at Date Time    Intake/Output Summary (Last 24 hours) at 07/19/12 1237  Last data filed at 07/19/12 1000   Gross per 24 hour   Intake    400 ml   Output      0 ml   Net    400 ml       Physical Exam   Constitutional: She is oriented to person, place, and time. She appears well-developed.   HENT: No oral lesions seen  Head: Normocephalic and atraumatic.   Eyes: Pupils are equal, round, and reactive to light.   Neck: No JVD present. No tracheal deviation present. No  thyromegaly present.   Cardiovascular: Normal rate and normal heart sounds.    Pulmonary/Chest: No respiratory distress. She has no wheezes. She has no rales.   Abdominal: She exhibits no distension. There is no tenderness. There is no rebound and no guarding.   Musculoskeletal: She exhibits no edema.   Neurological: She is alert and oriented to person, place, and time.           Labs:     Results     Procedure Component Value Units Date/Time    CBC and differential [[id number]]  (Abnormal) Collected:07/19/12 0603    Specimen Information:Blood / Blood Updated:07/19/12 0726     WBC 0.27 (L)      RBC 2.57 (L)      Hgb 8.5 (L) g/dL      Hematocrit 40.9 (L) %      MCV 96.5 fL      MCH 33.1 (H) pg      MCHC 34.3 g/dL      RDW 13 %      Platelets 49 (L)      MPV 9.9 fL      Neutrophils 0 %      Lymphocytes Automated 100 %      Monocytes 0 %      Eosinophils Automated 0 %      Basophils Automated 0 %      Immature Granulocyte 0 %      Nucleated RBC 0      Neutrophils Absolute 0.00 (L)      Abs Lymph Automated 0.27 (L)      Abs Mono Automated 0.00      Abs Eos Automated 0.00      Absolute Baso Automated 0.00      Absolute Immature Granulocyte 0.00     HEMOLYZED INDEX [[id number]] Collected:07/19/12 0603     Hemolyzed Index 9 Updated:07/19/12 0655    GFR [[id number]]  Collected:07/19/12 0603     EGFR >60.0 Updated:07/19/12 0655    Comprehensive metabolic panel [[id number]]  (Abnormal) Collected:07/19/12 0603    Specimen Information:Blood Updated:07/19/12 0655     Glucose 101 (H) mg/dL      BUN 7.0 mg/dL      Creatinine 0.5 (L) mg/dL      Sodium 528      Potassium 3.9      Chloride 106      CO2 27      CALCIUM 9.2 mg/dL      Protein, Total 5.5 (L) g/dL      Albumin 2.8 (L) g/dL      AST (SGOT) 19 U/L      ALT 77 (H) U/L      Alkaline Phosphatase 63 U/L      Bilirubin, Total 0.8 mg/dL      Globulin 2.7 g/dL      Albumin/Globulin Ratio 1.0      Anion Gap 6.0     Prepare Apheresis Platelets [[id number]] Collected:07/18/12 1954    Specimen Information:Blood Updated:07/19/12 0146     Platelet Product- [id number]    issued     Narrative:    ?Number of units->1  ?Special Requirements->Leukoreduced/CMV Safe  ?Special Requirements->Irradiated  ?Transfusion Criteria->a)Platelet <10,000/uL    Blood culture [[id number]] Collected:07/18/12 1832    Specimen Information:Blood / Blood Updated:07/18/12 2251    Narrative:    8ml required    Blood culture [[id number]] Collected:07/18/12 1815    Specimen Information:Blood / Blood Updated:07/18/12 2251    Narrative:    8ml required  Manual Differential [[id number]]  (Abnormal) Collected:07/18/12 1815     Segmented Neutrophils 0 % Updated:07/18/12 1921     Band Neutrophils 0 %      Lymphocytes Manual 97 %      Monocytes Manual 3 %      Eosinophils Manual 0 %      Basophils Manual 0 %      Nucleated RBC 0      Abs Seg Manual 0.00 (L)      Bands Absolute 0.00      Absolute Lymph Manual 0.26 (L)      Monocytes Absolute 0.01      Absolute Eos Manual 0.00      Absolute Baso Manual 0.00     Cell MorpHology [[id number]] Collected:07/18/12 1815     Cell Morphology: Normal Updated:07/18/12 1921    CBC and differential [[id number]]  (Abnormal) Collected:07/18/12 1815    Specimen Information:Blood / Blood Updated:07/18/12 1921     WBC 0.27 (L)      RBC 2.70 (L)       Hgb 8.8 (L) g/dL      Hematocrit 84.6 (L) %      MCV 95.6 fL      MCH 32.6 (H) pg      MCHC 34.1 g/dL      RDW 13 %      Platelets 9 (LL)      MPV 11.0 fL             Rads:     Xr Chest 2 Views    06/26/2012   CLINICAL INDICATION: pneumonia  COMPARISON: None available  FINDINGS:   2 views of the chest were obtained. The lungs are clear. The heart and vascularity are within normal limits.  There is no pleural thickening or effusion. The osseous structures are unremarkable.      06/26/2012   No active cardiopulmonary disease   Heron Nay, MD  06/26/2012 11:12 PM     Chest 2 Views    06/25/2012  CLINICAL INDICATION: Fever  COMPARISON: None.  INTERPRETATION: Frontal and lateral views of the chest obtained. Cardiomediastinal contour within normal limits for age. Clear lungs and sharp sulci.           06/25/2012   No acute cardiopulmonary process.  Filbert Schilder, MD  06/25/2012 10:43 PM         Signed by: Eric Form, MD  Pager: [phone]

## 2012-07-19 NOTE — Plan of Care (Signed)
Problem: Hemodynamic Status: Immune Suppressed-Hematologic  Goal: Stable vital signs and fluid balance  Outcome: Progressing  Patient is in stable condition. VSS at this time. She denies pain=0 out of 10  Intervention: Encourage oral fluid intake.  Oral luids encouraged. IVF as ordered  Intervention: Monitor hemaglobin and hematocrit  H/H stable. Patient is status post 1 unit platelets. Platelet level=49 today      Problem: Infection/Potential for Infection  Goal: Free from infection  Outcome: Progressing  No signs and symptoms of infection noted at this time. Will continue to monitor. Patient encouraged to report new symptoms and signs  Intervention: Utilize isolation precautions.  Neutropenic precautions in place.      Comments:   Patient is generally weak. Energy conservation techniques applied. Still complaining of constipation- treated, awaiting for results.  Will continue iv antibiotics as ordered, Limit visitors

## 2012-07-19 NOTE — Progress Notes (Addendum)
Pt. Is running high grade fever and dr.choudry is notified and orderred new i.v. Antibiotics. Pt. Developped rashes  On her chest and upper back and benadryl i.v. Given as needed.dr. Leeroy Bock was notified and d/cd. merepenem and fortaz and flagyl was orderred and started as well as vanco.pt. Recd. i unit of plt. As orderred. V/s monitored and recorded.will cont. To monitor pt. Plan of care and treatments closely.neutropenic prec. Measures maintained.pt. Tol.the plt.with no s/s of reactions noted.

## 2012-07-19 NOTE — Consults (Signed)
Pharmacy Note: Vancomycin Dosing Consult    Patient weight: 45.36 kg (100 lb)   Patient SCr & CrCl:CREATININE: 0.5 mg/dL ABNORMAL (16/10/96 0454)  Estimated creatinine clearance - Cockcroft-Gault CrCl: 86.8 mL/min    Cultures:  7/5 -- blood x2: pending    Vancomycin Indication: febrile neutropenia  Vancomycin Target Trough Level: 15-20 mg/L     Recommendations:   1. Initial loading dose (20 mg/kg) = 1gm x1 given yesterday @ 1943  2. Maintenance regimen (15 mg/kg) = 750mg  IV Q12H   3. Trough ordered for Monday, 7/7 @ 0730 (prior to 4th dose).    Pharmacy will continue to follow.    Thanks,  Phillip Heal, PharmD, BCPS  [phone]

## 2012-07-19 NOTE — Progress Notes (Signed)
HEMATOLOGY ONCOLOGY PROGRESS NOTE    Date Time: 07/19/2012 8:12 AM  Patient Name: [name]      IMPRESSION:     Patient Active Problem List   Diagnosis   . Chondromalacia of patella   . Neutropenic fever   -AML -M0      PLAN: Febrile last night; feels better now.  Reaction to antibiotic, now resolved.  Needed one unit of platelets; excellent response with plt at 49K.  Not febrile now.  Encouraged hydration.  Day 14 marrow due this week (Tuesday)             HISTORY: [name] is a 57 year old who was in her usual state of excellent health until approximately 5 days prior to admission when she began to experience arthralgias, myalgias, and some rhinorrhea. She was noted to have temperature   elevations to as high as 102 and 103 degrees. 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 Cytogenetics revealed a normal 46XX chromosomal pattern. She is bcr/abl negative and FISH is pending.   She started 7+3 07/06/12. Today is day 7.  Interval History:  She continues to ambulate without difficulty. No complaints except for constipation for two days.         PHYSICAL EXAM:     Filed Vitals:    07/19/12 0616   BP: 107/56   Pulse: 76   Temp: 99.9 F (37.7 C)   Resp: 18   SpO2:        Intake and Output Summary (Last 24 hours) at Date Time  No intake or output data in the 24 hours ending 07/19/12 9147    Appearance: in no acute distress  HEENT: pharynx clear  Lungs: clear to ausculation  Heart: regular rate and rhythm, normal heart sounds  Abdomen: flat and soft, active bowel sounds  Extremities: no edema  Neuro: alert, appropriate  MEDS:   Scheduled Meds:  Current Facility-Administered Medications   Medication Dose Route Frequency   . acyclovir  400 mg Oral Q12H SCH   . ceftAZIDime  1 g Intravenous Q8H SCH   . docusate sodium  100 mg Oral Daily   . fluconazole  400 mg Intravenous Q24H SCH   . lactobacillus/streoptococcus  1 capsule Oral Daily   . metroNIDAZOLE  500 mg Intravenous Q8H SCH   .  pantoprazole  40 mg Oral QAM AC   . polyethylene glycol  17 g Oral Daily   . [COMPLETED] vancomycin  1,000 mg Intravenous Once   . vancomycin  750 mg Intravenous Q12H   . [DISCONTINUED] ceftAZIDime  1 g Intravenous Q8H   . [DISCONTINUED] meropenem  500 mg Intravenous Q8H     Continuous Infusions:       . sodium chloride 50 mL/hr at 07/18/12 1816   . sodium chloride       PRN Meds:.sodium chloride, acetaminophen, acetaminophen, ALPRAZolam, alteplase, alum & mag hydroxide-simethicone, bisacodyl, diphenhydrAMINE, diphenhydrAMINE-zinc acetate, EPINEPHrine, EPINEPHrine, hydrocortisone, lactulose, lidocaine, loperamide, LORazepam, LORazepam, Magic Mouthwash, morphine, naloxone, simethicone, sodium phosphate    LABS:     Results     Procedure Component Value Units Date/Time    CBC and differential [[id number]]  (Abnormal) Collected:07/19/12 0603    Specimen Information:Blood / Blood Updated:07/19/12 0726     WBC 0.27 (L)      RBC 2.57 (L)      Hgb 8.5 (L) g/dL      Hematocrit 86.5 (L) %      MCV 96.5 fL  MCH 33.1 (H) pg      MCHC 34.3 g/dL      RDW 13 %      Platelets 49 (L)      MPV 9.9 fL      Neutrophils 0 %      Lymphocytes Automated 100 %      Monocytes 0 %      Eosinophils Automated 0 %      Basophils Automated 0 %      Immature Granulocyte 0 %      Nucleated RBC 0      Neutrophils Absolute 0.00 (L)      Abs Lymph Automated 0.27 (L)      Abs Mono Automated 0.00      Abs Eos Automated 0.00      Absolute Baso Automated 0.00      Absolute Immature Granulocyte 0.00     HEMOLYZED INDEX [[id number]] Collected:07/19/12 0603     Hemolyzed Index 9 Updated:07/19/12 0655    GFR [[id number]] Collected:07/19/12 0603     EGFR >60.0 Updated:07/19/12 0655    Comprehensive metabolic panel [[id number]]  (Abnormal) Collected:07/19/12 0603    Specimen Information:Blood Updated:07/19/12 0655     Glucose 101 (H) mg/dL      BUN 7.0 mg/dL      Creatinine 0.5 (L) mg/dL      Sodium 474      Potassium 3.9      Chloride 106      CO2 27       CALCIUM 9.2 mg/dL      Protein, Total 5.5 (L) g/dL      Albumin 2.8 (L) g/dL      AST (SGOT) 19 U/L      ALT 77 (H) U/L      Alkaline Phosphatase 63 U/L      Bilirubin, Total 0.8 mg/dL      Globulin 2.7 g/dL      Albumin/Globulin Ratio 1.0      Anion Gap 6.0     Prepare Apheresis Platelets [[id number]] Collected:07/18/12 1954    Specimen Information:Blood Updated:07/19/12 0146     Platelet Product- [id number]    issued     Narrative:    ?Number of units->1  ?Special Requirements->Leukoreduced/CMV Safe  ?Special Requirements->Irradiated  ?Transfusion Criteria->a)Platelet <10,000/uL    Blood culture [[id number]] Collected:07/18/12 1832    Specimen Information:Blood / Blood Updated:07/18/12 2251    Narrative:    8ml required    Blood culture [[id number]] Collected:07/18/12 1815    Specimen Information:Blood / Blood Updated:07/18/12 2251    Narrative:    8ml required    Manual Differential [[id number]]  (Abnormal) Collected:07/18/12 1815     Segmented Neutrophils 0 % Updated:07/18/12 1921     Band Neutrophils 0 %      Lymphocytes Manual 97 %      Monocytes Manual 3 %      Eosinophils Manual 0 %      Basophils Manual 0 %      Nucleated RBC 0      Abs Seg Manual 0.00 (L)      Bands Absolute 0.00      Absolute Lymph Manual 0.26 (L)      Monocytes Absolute 0.01      Absolute Eos Manual 0.00      Absolute Baso Manual 0.00     Cell MorpHology [[id number]] Collected:07/18/12 1815     Cell Morphology: Normal Updated:07/18/12 1921    CBC and differential [[id number]]  (Abnormal) Collected:07/18/12 1815  Specimen Information:Blood / Blood Updated:07/18/12 1921     WBC 0.27 (L)      RBC 2.70 (L)      Hgb 8.8 (L) g/dL      Hematocrit 16.1 (L) %      MCV 95.6 fL      MCH 32.6 (H) pg      MCHC 34.1 g/dL      RDW 13 %      Platelets 9 (LL)      MPV 11.0 fL           IMAGING DATA:  Xr Chest 2 Views    06/26/2012   CLINICAL INDICATION: pneumonia  COMPARISON: None available  FINDINGS:   2 views of the chest were obtained. The lungs are  clear. The heart and vascularity are within normal limits.  There is no pleural thickening or effusion. The osseous structures are unremarkable.      06/26/2012   No active cardiopulmonary disease   Heron Nay, MD  06/26/2012 11:12 PM     Chest 2 Views    06/25/2012  CLINICAL INDICATION: Fever  COMPARISON: None.  INTERPRETATION: Frontal and lateral views of the chest obtained. Cardiomediastinal contour within normal limits for age. Clear lungs and sharp sulci.           06/25/2012   No acute cardiopulmonary process.  Mitali  Bapna, MD  06/25/2012 10:43 PM     Ct Sinus Facial Bones Wo Contrast    06/28/2012  HISTORY: Sinusitis  COMPARISON: None.  TECHNIQUE: Axial CT scans slices of the paranasal sinuses and facial bones with sagittal and coronal reformats  FINDINGS: No fracture or other bony abnormality. The sinuses are clear. The ostiomeatal complexes are patent. There is no mass or nasopharynx. There is a left concha bullosa. The nasal septum is midline.      06/28/2012   Normal  Lynnae Prude, MD  06/28/2012 2:42 PM     Ct Chest Wo Contrast    06/28/2012  HISTORY: Cough  COMPARISON: None  TECHNIQUE:  Helical CT scan of the chest was obtained from the apices to the lung bases without intravenous contrast.      FINDINGS:  Lungs and central airways: Atelectasis or scarring in the lower lobes. Pleura: Trace effusions bilaterally. Aorta and Great Vessels: Within normal limits. Heart: No pericardial effusion. No coronary artery calcification. Mediastinum and hila: No mass or adenopathy Upper Abdomen: No significant abnormality. Bones: No significant abnormality. Chest wall and lower neck: Normal       06/28/2012   Atelectasis or scarring in the lower lobes. Trace bilateral pleural effusions.  Lynnae Prude, MD  06/28/2012 2:47 PM     Nm Cardiac Muga (at Rest)    07/02/2012  CLINICAL INDICATION: Prechemotherapy evaluation. Leukemia.  TECHNIQUE: A dynamic MUGA scan was obtained. Imaging obtained in the LAO, lateral, and anterior  projections. The left ventricle region of interest was drawn, on the best separation view, in end-diastole and end-systole, and ejection fraction was calculated. 23.4 mCi of Tc60m labeled autologous RBCs, using the Ultratag technique was administered.  FINDINGS: Normal wall motion is seen in the left ventricle. The ejection fraction is calculated at 79%, which is normal.      07/02/2012   NORMAL EJECTION FRACTION, OF 79%.  Darnelle Maffucci, MD  07/02/2012 2:23 PM     Biopsy, Bone Marrow    06/29/2012  EXAMINATION: Fluoroscopic guided bone marrow aspirate and core biopsy.  INTERVENTIONALIST: Genene Churn  Excell Seltzer, MD.  HISTORY:  Patient is a 57 year old female with pancytopenia and neutropenic fever.  Anesthesia: Moderate sedation with Fentanyl and Versed was administered with appropriate physiologic monitoring using an independent trained observer for 30 minutes. 1% lidocaine local was administered as well.  TECHNIQUE: With the patient in the prone position the skin over the left iliac crest was prepped and draped in usual sterile fashion. Local anesthesia was applied to superficial and deep tissues with 2% lidocaine. Subsequently, under direct fluoroscopic guidance, a bone marrow needle was advanced to the posteromedial aspect of the superior left iliac crest. The outer cortex was traversed and bone marrow aspiration performed and the needle removed.    The bone marrow core needle was then advanced to a separate site on the iliac crest to obtain a core of bone marrow tissue. The samples were given to hematology for analysis. The patient tolerated procedure well without apparent competition.  INTERPRETATION: Fluoroscopy revealed successful access to the left iliac crest marrow cavity for bone marrow aspiration and for core biopsy.  Fluoroscopy Time: 0.3 min      06/29/2012   Technically successful fluoroscopic guided bone marrow aspirate and core biopsy for pancytopenia.  Larrie Kass, MD  06/29/2012 4:21 PM     Tunneled Cath  Placement (permcath)    07/03/2012   Clinical history: Power line  Interventionalist: Dara Lords, MD  Informed consent obtained and all questions answered.  Anesthesia:  Moderate sedation with appropriate monitoring plus local. This consisted of Versed and fentanyl administration with continuous hemodynamic monitoring with a trained observer. Total anesthesia time with direct physician supervision 20 minutes.  Procedure: After standard prepping and draping with maximal sterile barrier technique and local anesthesia with 2% Xylocaine, percutaneous puncture of the right internal jugular vein is performed under ultrasound guidance with [place] image obtained for the medical record. The guidewire is advanced under fluoroscopic guidance. Dilator is advanced over the wire and left in place and flushed.  A double-lumen power line cuffed catheter is tunneled from the venotomy site to an exit site on the right anterior chest wall after prior infiltration of the right anterior chest wall with Xylocaine plus epinephrine.  The dilator at the venotomy site is exchanged for a peel-away sheath. The double-lumen power line (equivalent to Groshong) catheter is advanced through the peel-away sheath. The peel-away sheath was removed. The catheter length is adjusted to position the catheter tip in the superior vena cava.  The catheter secured in place with 2-0 Prolene suture. The venotomy site was closed with  Dermabond.  Complications: None apparent.  Fluoroscopy time:0.3 minutes  Findings: The right internal jugular vein is patent by US imaging. An [place] image is obtained for the medical record. Catheter tip is in the superior vena cava.      07/03/2012    successful ultrasound and fluoroscopically guided placement of a right internal jugular dual-lumen cuffed power line catheter. The catheter may be used immediately.  Dara Lords, MD  07/03/2012 2:42 PM                       Gunnar Fusi, MD  [facility]    [phone]

## 2012-07-19 NOTE — Progress Notes (Signed)
Infectious Diseases & Tropical Medicine  Progress Note    07/19/2012   [name] ZOX:[id number] is a 57 y.o. female,       Assessment:     Acute leukemia - s/p first chemotherapy  Groshong catheter in place  Pancytopenia / neutropenia  Elevated LFT's  improved  CXR -small bilateral pleural effusions  Fever   Developed erythematous rash last evening - likely secondary to vancomycin  Rash completely resolved now  Unlikely meropenem allergy    Plan:      Start vancomycin and meropenem again today   Administer separately and over a period of time   D/W patient,nurse and pharmacy   Discontinue ceftazidime and flagyl   Continue diflucan and acyclovir    Continue probiotics   Isolation precautions including gowns   Reviewed notes   D/W Dr Mylo Red    ROS:     General:  Fever spikes improving, no chills, no rigor,awake and alert,feeling tired  HEENT: no neck pain, no throat pain  Endocrine:c/o fatigue  Respiratory: no cough,no shortness of breath, or wheezing   Cardiovascular: no chest pain   Gastrointestinal: no abdominal pain,no N/V/D  Genito-Urinary: no dysuria, trouble voiding, or hematuria  Musculoskeletal: no edema, myalgias, leg swellling  Neurological: c/o generalized weakness  Dermatological: no rash, no ulcer    Physical Examination:     Blood pressure 107/56, pulse 76, temperature 99.9 F (37.7 C), temperature source Oral, resp. rate 18, height 1.651 m (5\' 5" ), weight 45.36 kg (100 lb), SpO2 98.00%.     General Appearance: Comfortable, and in no acute distress. Awake,alert   HEENT: Pupils are equal, round, and reactive to light.    Lungs: scattered rhonchi   Heart: bradycardia   Chest: Symmetric chest wall expansion.minimal rash at anterior chest    Abdomen: soft ,non tender,no hepatosplenomegaly   Neurological: No focal deficit   Extremities: No edema    Laboratory And Diagnostic Studies:     Recent Labs   [facility] 07/19/12 0603 07/18/12 1815    WBC 0.27* 0.27*    HGB 8.5* 8.8*     HCT 24.8* 25.8*    PLT 49* 9*     Recent Labs   [facility] 07/19/12 0603    NA 139    K 3.9    CL 106    CO2 27    BUN 7.0    CREAT 0.5*    GLU 101*    CA 9.2     Recent Labs   Basename 07/19/12 0603    AST 19    ALT 77*    ALKPHOS 63    PROT 5.5*    ALB 2.8*       Current Meds:      Scheduled Meds: PRN Meds:           acyclovir 400 mg Oral Q12H SCH   ceftAZIDime 1 g Intravenous Q8H SCH   docusate sodium 100 mg Oral Daily   fluconazole 400 mg Intravenous Q24H SCH   lactobacillus/streoptococcus 1 capsule Oral Daily   metroNIDAZOLE 500 mg Intravenous Q8H SCH   pantoprazole 40 mg Oral QAM AC   polyethylene glycol 17 g Oral Daily   [COMPLETED] vancomycin 1,000 mg Intravenous Once   vancomycin 750 mg Intravenous Q12H   [DISCONTINUED] ceftAZIDime 1 g Intravenous Q8H   [DISCONTINUED] meropenem 500 mg Intravenous Q8H       Continuous Infusions:       . sodium chloride 50 mL/hr at 07/18/12 1816   .  sodium chloride           sodium chloride 200 mL/hr Continuous PRN   acetaminophen 650 mg Q4H PRN   acetaminophen 650 mg Q4H PRN   ALPRAZolam 0.25 mg QHS PRN   alteplase 2 mg Once PRN   alum & mag hydroxide-simethicone 30 mL Q4H PRN   bisacodyl 5 mg QD PRN   diphenhydrAMINE 50 mg PRN   diphenhydrAMINE-zinc acetate  TID PRN   EPINEPHrine 0.3 mg PRN   EPINEPHrine 0.1 mg PRN   hydrocortisone 100 mg PRN   lactulose 20 g BID PRN   lidocaine  PRN   loperamide 2 mg PRN   LORazepam 1 mg Q4H PRN   Or     LORazepam 1 mg Q4H PRN   Magic Mouthwash 30 mL Q4H PRN   morphine 2 mg Q2H PRN   naloxone 0.2 mg PRN   simethicone 80 mg Q6H PRN   sodium phosphate 1 enema QD PRN         [name] A. Janalyn Rouse, M.D.  07/19/2012  9:58 AM

## 2012-07-20 LAB — COMPREHENSIVE METABOLIC PANEL
ALT: 73 U/L — ABNORMAL HIGH (ref 0–55)
AST (SGOT): 21 U/L (ref 5–34)
Albumin/Globulin Ratio: 0.9 (ref 0.9–2.2)
Albumin: 2.5 g/dL — ABNORMAL LOW (ref 3.5–5.0)
Alkaline Phosphatase: 56 U/L (ref 40–150)
Anion Gap: 3 — ABNORMAL LOW (ref 5.0–15.0)
BUN: 8 mg/dL (ref 7.0–19.0)
Bilirubin, Total: 0.4 mg/dL (ref 0.2–1.2)
CO2: 26 (ref 22–29)
Calcium: 8.6 mg/dL (ref 8.5–10.5)
Chloride: 111 — ABNORMAL HIGH (ref 98–107)
Creatinine: 0.5 mg/dL — ABNORMAL LOW (ref 0.6–1.0)
Globulin: 2.7 g/dL (ref 2.0–3.6)
Glucose: 103 mg/dL — ABNORMAL HIGH (ref 70–100)
Potassium: 3.7 (ref 3.5–5.1)
Protein, Total: 5.2 g/dL — ABNORMAL LOW (ref 6.0–8.3)
Sodium: 140 (ref 136–145)

## 2012-07-20 LAB — CELL MORPHOLOGY: Cell Morphology: ABNORMAL — AB

## 2012-07-20 LAB — MAN DIFF ONLY
Band Neutrophils Absolute: 0 (ref 0.00–1.00)
Band Neutrophils: 0 %
Basophils Absolute Manual: 0 (ref 0.00–0.20)
Basophils Manual: 0 %
Eosinophils Absolute Manual: 0 (ref 0.00–0.70)
Eosinophils Manual: 0 %
Lymphocytes Absolute Manual: 0.39 — ABNORMAL LOW (ref 0.50–4.40)
Lymphocytes Manual: 100 %
Monocytes Absolute: 0 (ref 0.00–1.20)
Monocytes Manual: 0 %
Neutrophils Absolute Manual: 0 — ABNORMAL LOW (ref 1.80–8.10)
Nucleated RBC: 0 (ref 0–1)
Segmented Neutrophils: 0 %

## 2012-07-20 LAB — VANCOMYCIN, TROUGH: Vancomycin Trough: 3.7 ug/mL — ABNORMAL LOW (ref 10.0–20.0)

## 2012-07-20 LAB — GFR: EGFR: >60

## 2012-07-20 LAB — CBC AND DIFFERENTIAL
Hematocrit: 22.5 % — ABNORMAL LOW (ref 37.0–47.0)
Hgb: 7.8 g/dL — ABNORMAL LOW (ref 12.0–16.0)
MCH: 32.5 pg — ABNORMAL HIGH (ref 28.0–32.0)
MCHC: 34.7 g/dL (ref 32.0–36.0)
MCV: 93.8 fL (ref 80.0–100.0)
MPV: 9.8 fL (ref 9.4–12.3)
Platelets: 37 — ABNORMAL LOW (ref 140–400)
RBC: 2.4 — ABNORMAL LOW (ref 4.20–5.40)
RDW: 13 % (ref 12–15)
WBC: 0.39 — ABNORMAL LOW (ref 3.50–10.80)

## 2012-07-20 LAB — HEMOLYSIS INDEX: Hemolysis Index: 6 (ref 0–18)

## 2012-07-20 MED ORDER — FLUCONAZOLE IN SODIUM CHLORIDE 2 MG/ML (CUSTOM)
100.00 mg | INTRAVENOUS | Status: DC
Start: 2012-07-20 — End: 2012-07-28
  Administered 2012-07-20 – 2012-07-28 (×8): 100 mg via INTRAVENOUS
  Filled 2012-07-20 (×9): qty 50

## 2012-07-20 MED ORDER — HYDROCORTISONE 1 % EX CREA
TOPICAL_CREAM | Freq: Two times a day (BID) | CUTANEOUS | Status: DC
Start: 2012-07-20 — End: 2012-07-28
  Administered 2012-07-27: 1 g via TOPICAL
  Filled 2012-07-20 (×2): qty 28.4

## 2012-07-20 MED ORDER — VANCOMYCIN HCL 1000 MG IV SOLR
1000.0000 mg | Freq: Three times a day (TID) | INTRAVENOUS | Status: DC
Start: 2012-07-20 — End: 2012-07-21
  Administered 2012-07-20 – 2012-07-21 (×3): 1000 mg via INTRAVENOUS
  Filled 2012-07-20 (×6): qty 1000

## 2012-07-20 NOTE — Consults (Signed)
Pharmacy Note: Vancomycin Dosing Consult    Patient weight: 45.36 kg (100 lb)   Patient SCr & CrCl:CREATININE: 0.5 mg/dL ABNORMAL (84/16/60 6301)  Estimated creatinine clearance - Cockcroft-Gault CrCl: 86.8 mL/min      Vancomycin Indication: fabrile neutropenia  Vancomycin Target Trough Level:  15-20 mg _       Lab 07/20/12 0606 07/19/12 0603 07/16/12 0456 07/15/12 0607 07/14/12 0420   CREAT 0.5* 0.5* 0.6 0.5* 0.5*       Recommendations:   Tough = 3.7 mg/L  @ 7/7 0745  Current Regimen: 750 mg q 12h    New Regimen: 1 gm q 8h  Trough ordered prior to 4th dose ( 7/8 0930)    Rph name:Nicolemarie Wooley  Rph phone:  [phone]

## 2012-07-20 NOTE — Progress Notes (Signed)
Infectious Diseases & Tropical Medicine  Progress Note    07/20/2012   Mollie Rossano [id number] is a 57 y.o. female,       Assessment:     Acute leukemia - s/p first chemotherapy  Groshong catheter in place  Pancytopenia / neutropenia  Elevated LFT's  improved  CXR -small bilateral pleural effusions  Fever improving  Minimal rash around Groshong catheter likely contact dermatitis  Unlikely meropenem allergy    Plan:      Continue vancomycin and meropenem   Pharmacy monitoring vancomycin levels   Tolerating well   Hydrocortisone ointment to upper chest rash   D/W patient if any worsening of upper chest rash -have nurse call me   Continue diflucan and acyclovir    Continue probiotics   Isolation precautions including gowns   Reviewed notes    ROS:     General:  Fever spikes improving, no chills, no rigor,awake and alert,feeling better  HEENT: no neck pain, no throat pain  Endocrine:c/o fatigue  Respiratory: no cough,no shortness of breath, or wheezing   Cardiovascular: no chest pain   Gastrointestinal: no abdominal pain,no N/V/D  Genito-Urinary: no dysuria, trouble voiding, or hematuria  Musculoskeletal: no edema, myalgias, leg swellling  Neurological: c/o generalized weakness  Dermatological: no rash, no ulcer    Physical Examination:     Blood pressure 111/61, pulse 76, temperature 99.4 F (37.4 C), temperature source Oral, resp. rate 18, height 1.651 m (5\' 5" ), weight 45.36 kg (100 lb), SpO2 100.00%.     General Appearance: Comfortable, and in no acute distress. Awake,alert   HEENT: Pupils are equal, round, and reactive to light.    Lungs: decreased breath sounds   Heart: RRR   Chest: Symmetric chest wall expansion.minimal rash at anterior chest    Abdomen: soft ,non tender,no hepatosplenomegaly   Neurological: No focal deficit   Extremities: No edema    Laboratory And Diagnostic Studies:     Recent Labs   [facility] 07/20/12 0606 07/19/12 0603    WBC 0.39* 0.27*    HGB 7.8* 8.5*     HCT 22.5* 24.8*    PLT 37* 49*     Recent Labs   Basename 07/20/12 0606 07/19/12 0603    NA 140 139    K 3.7 3.9    CL 111* 106    CO2 26 27    BUN 8.0 7.0    CREAT 0.5* 0.5*    GLU 103* 101*    CA 8.6 9.2     Recent Labs   Basename 07/20/12 0606 07/19/12 0603    AST 21 19    ALT 73* 77*    ALKPHOS 56 63    PROT 5.2* 5.5*    ALB 2.5* 2.8*       Current Meds:      Scheduled Meds: PRN Meds:           acyclovir 400 mg Oral Q12H SCH   docusate sodium 100 mg Oral Daily   fluconazole 400 mg Intravenous Q24H SCH   lactobacillus/streoptococcus 1 capsule Oral Daily   meropenem 500 mg Intravenous Q8H SCH   pantoprazole 40 mg Oral QAM AC   polyethylene glycol 17 g Oral Daily   vancomycin 1,000 mg Intravenous Q8H   [DISCONTINUED] ceftAZIDime 1 g Intravenous Q8H [facility]   [DISCONTINUED] metroNIDAZOLE 500 mg Intravenous Q8H [facility]   [DISCONTINUED] vancomycin 750 mg Intravenous Q12H       Continuous Infusions:       .  sodium chloride 50 mL/hr at 07/19/12 1729   . sodium chloride           sodium chloride 200 mL/hr Continuous PRN   acetaminophen 650 mg Q4H PRN   acetaminophen 650 mg Q4H PRN   ALPRAZolam 0.25 mg QHS PRN   alteplase 2 mg Once PRN   alum & mag hydroxide-simethicone 30 mL Q4H PRN   bisacodyl 5 mg QD PRN   diphenhydrAMINE 50 mg PRN   diphenhydrAMINE-zinc acetate  TID PRN   EPINEPHrine 0.3 mg PRN   EPINEPHrine 0.1 mg PRN   hydrocortisone 100 mg PRN   lactulose 20 g BID PRN   lidocaine  PRN   loperamide 2 mg PRN   LORazepam 1 mg Q4H PRN   Or     LORazepam 1 mg Q4H PRN   Magic Mouthwash 30 mL Q4H PRN   morphine 2 mg Q2H PRN   naloxone 0.2 mg PRN   simethicone 80 mg Q6H PRN   sodium phosphate 1 enema QD PRN         Kelwin Gibler A. Janalyn Rouse, M.D.  07/20/2012  9:14 AM

## 2012-07-20 NOTE — Plan of Care (Signed)
Problem: Infection/Potential for Infection  Goal: Free from infection  Outcome: Progressing  Patient on neutropenic precautions educated patient/visitors on importance of precautions.  IV antibiotics and IV fluids    Problem: Moderate/High Fall Risk Score >/=15  Goal: Patient will remain free of falls  Outcome: Progressing  Patient has remained free from falls during shift, call bell within reach and patient reminded to call if assistance is needed.

## 2012-07-20 NOTE — Progress Notes (Signed)
Daily PROGRESS NOTE    Date Time: 07/20/2012 11:37 PM  Patient Name: [name], [name]  Patient Status: Inpatient  Hospital Day: 25    Assessment:   Acute Leukemia, NOS   Neutropenic fever  .Pancytopenia.   .Weight loss with a body mass index of 18 shows mild to moderate   Malnutrition  . Skin rash.  Epigastric pain  Sinus brady    Plan:   1. Chemo in progress  2. Transfused Platelets, now stable  3. Neutropenic precaution  4. Prophylactic Abx per ID  5. Merrrem, Vancomycin, acyclovir, diflucan  6. Transfuse RBCs, Platelets as needed  7. Nutrition supplement    Subjective:    Fatigue better today, bloating  10 point Review of Systems - Negative except for the Positives mentioned above      Medications:     Current Facility-Administered Medications   Medication Dose Route Frequency   . acyclovir  400 mg Oral Q12H SCH   . docusate sodium  100 mg Oral Daily   . fluconazole  100 mg Intravenous Q24H SCH   . hydrocortisone   Topical BID   . lactobacillus/streoptococcus  1 capsule Oral Daily   . meropenem  500 mg Intravenous Q8H SCH   . pantoprazole  40 mg Oral QAM AC   . polyethylene glycol  17 g Oral Daily   . vancomycin  1,000 mg Intravenous Q8H   . [DISCONTINUED] fluconazole  400 mg Intravenous Q24H SCH   . [DISCONTINUED] vancomycin  750 mg Intravenous Q12H          sodium chloride 200 mL/hr Continuous PRN   acetaminophen 650 mg Q4H PRN   acetaminophen 650 mg Q4H PRN   ALPRAZolam 0.25 mg QHS PRN   alteplase 2 mg Once PRN   alum & mag hydroxide-simethicone 30 mL Q4H PRN   bisacodyl 5 mg QD PRN   diphenhydrAMINE 50 mg PRN   diphenhydrAMINE-zinc acetate  TID PRN   EPINEPHrine 0.3 mg PRN   EPINEPHrine 0.1 mg PRN   hydrocortisone 100 mg PRN   lactulose 20 g BID PRN   lidocaine  PRN   loperamide 2 mg PRN   LORazepam 1 mg Q4H PRN   Or     LORazepam 1 mg Q4H PRN   Magic Mouthwash 30 mL Q4H PRN   morphine 2 mg Q2H PRN   naloxone 0.2 mg PRN   simethicone 80 mg Q6H PRN   sodium phosphate 1 enema QD PRN        Physical Exam:      Filed Vitals:    07/20/12 2028   BP: 105/55   Pulse: 90   Temp: 99.9 F (37.7 C)   Resp: 16   SpO2: 100%       Intake and Output Summary (Last 24 hours) at Date Time    Intake/Output Summary (Last 24 hours) at 07/20/12 2337  Last data filed at 07/20/12 0054   Gross per 24 hour   Intake     50 ml   Output      0 ml   Net     50 ml       Physical Exam   Constitutional: She is oriented to person, place, and time. She appears well-developed.   HENT: No oral lesions seen  Head: Normocephalic and atraumatic.   Eyes: Pupils are equal, round, and reactive to light.   Neck: No JVD present. No tracheal deviation present. No thyromegaly present.   Cardiovascular: Normal rate  and normal heart sounds.    Pulmonary/Chest: No respiratory distress. She has no wheezes. She has no rales.   Abdominal: She exhibits no distension. There is no tenderness. There is no rebound and no guarding.   Musculoskeletal: She exhibits no edema.   Neurological: She is alert and oriented to person, place, and time.           Labs:     Results     Procedure Component Value Units Date/Time    Blood culture [[id number]] Collected:07/18/12 1815    Specimen Information:Blood / Blood Updated:07/20/12 2321    Narrative:    ORDER#: [id number]                                    ORDERED BY: CHOUDHARY, IMTI  SOURCE: Blood PERIPHERAL                             COLLECTED:  07/18/12 18:15  ANTIBIOTICS AT COLL.:                                RECEIVED :  07/18/12 22:51  Culture Blood                              PRELIM      07/20/12 23:21  07/19/12   No Growth after 1 day/s of incubation.  07/20/12   No Growth after 2 day/s of incubation.      Blood culture [[id number]] Collected:07/18/12 1832    Specimen Information:Blood / Blood Updated:07/20/12 2321    Narrative:    ORDER#: [id number]                                    ORDERED BY: Janalyn Rouse, IMTI  SOURCE: Blood R CHEST CENTRAL LINE                   COLLECTED:  07/18/12 18:32  ANTIBIOTICS AT COLL.:                                 RECEIVED :  07/18/12 22:51  Culture Blood                              PRELIM      07/20/12 23:21  07/19/12   No Growth after 1 day/s of incubation.  07/20/12   No Growth after 2 day/s of incubation.      Vancomycin, trough [[id number]]  (Abnormal) Collected:07/20/12 0745    Specimen Information:Blood Updated:07/20/12 0826     Vancomycin Trough 3.7 (L) ug/mL      Vancomycin Time of Last Dose unk      Vancomycin Date of Last Dose 07/19/2012     Manual Differential [[id number]]  (Abnormal) Collected:07/20/12 0606     Segmented Neutrophils 0 % Updated:07/20/12 0745     Band Neutrophils 0 %      Lymphocytes Manual 100 %      Monocytes Manual 0 %      Eosinophils Manual 0 %      Basophils Manual 0 %  Nucleated RBC 0      Abs Seg Manual 0.00 (L)      Bands Absolute 0.00      Absolute Lymph Manual 0.39 (L)      Monocytes Absolute 0.00      Absolute Eos Manual 0.00      Absolute Baso Manual 0.00     Cell MorpHology [[id number]]  (Abnormal) Collected:07/20/12 0606     Cell Morphology: Abnormal (A) Updated:07/20/12 0745     Anisocytosis =1+ (A)     CBC and differential [[id number]]  (Abnormal) Collected:07/20/12 0606    Specimen Information:Blood / Blood Updated:07/20/12 0745     WBC 0.39 (L)      RBC 2.40 (L)      Hgb 7.8 (L) g/dL      Hematocrit 56.2 (L) %      MCV 93.8 fL      MCH 32.5 (H) pg      MCHC 34.7 g/dL      RDW 13 %      Platelets 37 (L)      MPV 9.8 fL     HEMOLYZED INDEX [[id number]] Collected:07/20/12 0606     Hemolyzed Index 6 Updated:07/20/12 0659    GFR [[id number]] Collected:07/20/12 0606     EGFR >60.0 Updated:07/20/12 0659    Comprehensive metabolic panel [[id number]]  (Abnormal) Collected:07/20/12 0606    Specimen Information:Blood Updated:07/20/12 0659     Glucose 103 (H) mg/dL      BUN 8.0 mg/dL      Creatinine 0.5 (L) mg/dL      Sodium 725      Potassium 3.7      Chloride 111 (H)      CO2 26      CALCIUM 8.6 mg/dL      Protein, Total 5.2 (L) g/dL      Albumin 2.5 (L) g/dL      AST  (SGOT) 21 U/L      ALT 73 (H) U/L      Alkaline Phosphatase 56 U/L      Bilirubin, Total 0.4 mg/dL      Globulin 2.7 g/dL      Albumin/Globulin Ratio 0.9      Anion Gap 3.0 (L)     Prepare Apheresis Platelets [[id number]] Collected:07/18/12 1954    Specimen Information:Blood Updated:07/20/12 0052     Platelet Product- [id number]    transfused     Narrative:    ?Number of units->1  ?Special Requirements->Leukoreduced/CMV Safe  ?Special Requirements->Irradiated  ?Transfusion Criteria->a)Platelet <10,000/uL            Rads:     Xr Chest 2 Views    06/26/2012   CLINICAL INDICATION: pneumonia  COMPARISON: None available  FINDINGS:   2 views of the chest were obtained. The lungs are clear. The heart and vascularity are within normal limits.  There is no pleural thickening or effusion. The osseous structures are unremarkable.      06/26/2012   No active cardiopulmonary disease   Heron Nay, MD  06/26/2012 11:12 PM     Chest 2 Views    06/25/2012  CLINICAL INDICATION: Fever  COMPARISON: None.  INTERPRETATION: Frontal and lateral views of the chest obtained. Cardiomediastinal contour within normal limits for age. Clear lungs and sharp sulci.           06/25/2012   No acute cardiopulmonary process.  Filbert Schilder, MD  06/25/2012 10:43 PM         Signed by: Verne Grain  Rueben Bash, MD  Pager: [phone]

## 2012-07-20 NOTE — Progress Notes (Signed)
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 Received  7+3.  Day 14 marrow tomorrow.   Will add Neupogen after marrow is done.  2- Neutropenic fever.  Continue antibiotics as per Dr. Janalyn Rouse.  3- Pancytopenia.  Transfuse as needed.  Neutropenic precausions.  4- Small bilateral pleural effusion. Follow.  5- Erythema at [place] catheter entry site. Follow.  OK with hydrocortisone topical.  6- Chondromalacia Patellae.    HISTORY:    Ms. Krah is a 57 year old who present with fevers, arthralgias, myalgias, and some rhinorrhea.   A CBC at the time of admission revealed a WBC of 0.84, hemoglobin 11.4, hematocrit 34.2 and a platelet count of 107.  Her absolute neutrophil count was 0.39.  A bone marrow showed AML.     INTERVAL HISTORY:    She continues to spike temperatures.  She is tired. No diarrhea. No SOB, back or chest pain.     PHYSICAL EXAM:     Filed Vitals:    07/20/12 0704   BP: 111/61   Pulse: 76   Temp: 99.4 F (37.4 C)   Resp: 18   SpO2: 100%     Appearance: Ill, but in no acute distress  HEENT: Pharynx clear  Lungs: Clear to ausculation  Heart: Regular rate and rhythm, normal heart sounds  Abdomen: Flat and soft, active bowel sounds  Extremities: No edema  Neuro: Alert, appropriate    MEDS:   Scheduled Meds:  Current Facility-Administered Medications   Medication Dose Route Frequency   . acyclovir  400 mg Oral Q12H SCH   . docusate sodium  100 mg Oral Daily   . fluconazole  100 mg Intravenous Q24H SCH   . hydrocortisone   Topical BID   . lactobacillus/streoptococcus  1 capsule Oral Daily   . meropenem  500 mg Intravenous Q8H SCH   . pantoprazole  40 mg Oral QAM AC   . polyethylene glycol  17 g Oral Daily   . vancomycin  1,000 mg Intravenous Q8H   . [DISCONTINUED] ceftAZIDime  1 g Intravenous Q8H SCH   . [DISCONTINUED] fluconazole  400 mg Intravenous Q24H SCH   . [DISCONTINUED]  metroNIDAZOLE  500 mg Intravenous Q8H SCH   . [DISCONTINUED] vancomycin  750 mg Intravenous Q12H     Continuous Infusions:       . sodium chloride 50 mL/hr at 07/19/12 1729   . sodium chloride       PRN Meds:.sodium chloride, acetaminophen, acetaminophen, ALPRAZolam, alteplase, alum & mag hydroxide-simethicone, bisacodyl, diphenhydrAMINE, diphenhydrAMINE-zinc acetate, EPINEPHrine, EPINEPHrine, hydrocortisone, lactulose, lidocaine, loperamide, LORazepam, LORazepam, Magic Mouthwash, morphine, naloxone, simethicone, sodium phosphate    LABS:     Results     Procedure Component Value Units Date/Time    Vancomycin, trough [[id number]]  (Abnormal) Collected:07/20/12 0745    Specimen Information:Blood Updated:07/20/12 0826     Vancomycin Trough 3.7 (L) ug/mL      Vancomycin Time of Last Dose unk      Vancomycin Date of Last Dose 07/19/2012     Manual Differential [[id number]]  (Abnormal) Collected:07/20/12 0606     Segmented Neutrophils 0 % Updated:07/20/12 0745     Band Neutrophils 0 %      Lymphocytes Manual 100 %      Monocytes Manual 0 %      Eosinophils Manual 0 %  Basophils Manual 0 %      Nucleated RBC 0      Abs Seg Manual 0.00 (L)      Bands Absolute 0.00      Absolute Lymph Manual 0.39 (L)      Monocytes Absolute 0.00      Absolute Eos Manual 0.00      Absolute Baso Manual 0.00     Cell MorpHology [[id number]]  (Abnormal) Collected:07/20/12 0606     Cell Morphology: Abnormal (A) Updated:07/20/12 0745     Anisocytosis =1+ (A)     CBC and differential [[id number]]  (Abnormal) Collected:07/20/12 0606    Specimen Information:Blood / Blood Updated:07/20/12 0745     WBC 0.39 (L)      RBC 2.40 (L)      Hgb 7.8 (L) g/dL      Hematocrit 28.4 (L) %      MCV 93.8 fL      MCH 32.5 (H) pg      MCHC 34.7 g/dL      RDW 13 %      Platelets 37 (L)      MPV 9.8 fL     HEMOLYZED INDEX [[id number]] Collected:07/20/12 0606     Hemolyzed Index 6 Updated:07/20/12 0659    GFR [[id number]] Collected:07/20/12 0606     EGFR >60.0  Updated:07/20/12 0659    Comprehensive metabolic panel [[id number]]  (Abnormal) Collected:07/20/12 0606    Specimen Information:Blood Updated:07/20/12 0659     Glucose 103 (H) mg/dL      BUN 8.0 mg/dL      Creatinine 0.5 (L) mg/dL      Sodium 387      Potassium 3.7      Chloride 111 (H)      CO2 26      CALCIUM 8.6 mg/dL      Protein, Total 5.2 (L) g/dL      Albumin 2.5 (L) g/dL      AST (SGOT) 21 U/L      ALT 73 (H) U/L      Alkaline Phosphatase 56 U/L      Bilirubin, Total 0.4 mg/dL      Globulin 2.7 g/dL      Albumin/Globulin Ratio 0.9      Anion Gap 3.0 (L)     Prepare Apheresis Platelets [[id number]] Collected:07/18/12 1954    Specimen Information:Blood Updated:07/20/12 0052     Platelet Product- [id number]    transfused     Narrative:    ?Number of units->1  ?Special Requirements->Leukoreduced/CMV Safe  ?Special Requirements->Irradiated  ?Transfusion Criteria->a)Platelet <10,000/uL    Blood culture [[id number]] Collected:07/18/12 1815    Specimen Information:Blood / Blood Updated:07/19/12 2321    Narrative:    ORDER#: [id number]                                    ORDERED BY: CHOUDHARY, IMTI  SOURCE: Blood PERIPHERAL                             COLLECTED:  07/18/12 18:15  ANTIBIOTICS AT COLL.:                                RECEIVED :  07/18/12 22:51  Culture Blood  PRELIM      07/19/12 23:21  07/19/12   No Growth after 1 day/s of incubation.      Blood culture [[id number]] Collected:07/18/12 1832    Specimen Information:Blood / Blood Updated:07/19/12 2321    Narrative:    ORDER#: [id number]                                    ORDERED BY: Janalyn Rouse, IMTI  SOURCE: Blood R CHEST CENTRAL LINE                   COLLECTED:  07/18/12 18:32  ANTIBIOTICS AT COLL.:                                RECEIVED :  07/18/12 22:51  Culture Blood                              PRELIM      07/19/12 23:21  07/19/12   No Growth after 1 day/s of incubation.            IMAGING DATA:  Xr Chest 2 Views    06/26/2012    CLINICAL INDICATION: pneumonia  COMPARISON: None available  FINDINGS:   2 views of the chest were obtained. The lungs are clear. The heart and vascularity are within normal limits.  There is no pleural thickening or effusion. The osseous structures are unremarkable.      06/26/2012   No active cardiopulmonary disease   Heron Nay, MD  06/26/2012 11:12 PM     Chest 2 Views    06/25/2012  CLINICAL INDICATION: Fever  COMPARISON: None.  INTERPRETATION: Frontal and lateral views of the chest obtained. Cardiomediastinal contour within normal limits for age. Clear lungs and sharp sulci.           06/25/2012   No acute cardiopulmonary process.  Filbert Schilder, MD  06/25/2012 10:43 PM      Sharmon Revere, MD  [facility]

## 2012-07-20 NOTE — Plan of Care (Signed)
Problem: Safety  Goal: Patient will be free from injury during hospitalization  Outcome: Progressing  Patient appears less fatigue and able to ambulate to the bathroom with no assistance or help.  Patient also able to perform ADLs well.    Problem: Pain  Goal: Patient's pain/discomfort is manageable  Outcome: Progressing  Patient continues to deny any pain at this time.  Patient verbalized having a bowel movement before change of shift.  Patient described stool has being formed.    Problem: Protective Precautions [in description add neutropenic precautions]  Goal: Free from nosocomial infections  Outcome: Progressing  Patient still on Neutropenic precautions and all precaution measures taken.  Proper handwashing technique used.  VS are stable and patient is afebrile.

## 2012-07-21 ENCOUNTER — Inpatient Hospital Stay: Payer: 59

## 2012-07-21 LAB — CBC AND DIFFERENTIAL
Basophils Absolute Automated: 0 (ref 0.00–0.20)
Basophils Automated: 0 %
Eosinophils Absolute Automated: 0 (ref 0.00–0.70)
Eosinophils Automated: 0 %
Hematocrit: 22.1 % — ABNORMAL LOW (ref 37.0–47.0)
Hgb: 7.6 g/dL — ABNORMAL LOW (ref 12.0–16.0)
Immature Granulocytes Absolute: 0
Immature Granulocytes: 0 %
Lymphocytes Absolute Automated: 0.44 — ABNORMAL LOW (ref 0.50–4.40)
Lymphocytes Automated: 100 %
MCH: 32.9 pg — ABNORMAL HIGH (ref 28.0–32.0)
MCHC: 34.4 g/dL (ref 32.0–36.0)
MCV: 95.7 fL (ref 80.0–100.0)
MPV: 10.3 fL (ref 9.4–12.3)
Monocytes Absolute Automated: 0 (ref 0.00–1.20)
Monocytes: 0 %
Neutrophils Absolute: 0 — ABNORMAL LOW (ref 1.80–8.10)
Neutrophils: 0 %
Nucleated RBC: 0 (ref 0–1)
Platelets: 25 — ABNORMAL LOW (ref 140–400)
RBC: 2.31 — ABNORMAL LOW (ref 4.20–5.40)
RDW: 13 % (ref 12–15)
WBC: 0.44 — ABNORMAL LOW (ref 3.50–10.80)

## 2012-07-21 LAB — COMPREHENSIVE METABOLIC PANEL
ALT: 57 U/L — ABNORMAL HIGH (ref 0–55)
AST (SGOT): 12 U/L (ref 5–34)
Albumin/Globulin Ratio: 0.9 (ref 0.9–2.2)
Albumin: 2.4 g/dL — ABNORMAL LOW (ref 3.5–5.0)
Alkaline Phosphatase: 54 U/L (ref 40–150)
Anion Gap: 3 — ABNORMAL LOW (ref 5.0–15.0)
BUN: 8 mg/dL (ref 7.0–19.0)
Bilirubin, Total: 0.5 mg/dL (ref 0.2–1.2)
CO2: 27 (ref 22–29)
Calcium: 8.4 mg/dL — ABNORMAL LOW (ref 8.5–10.5)
Chloride: 111 — ABNORMAL HIGH (ref 98–107)
Creatinine: 0.5 mg/dL — ABNORMAL LOW (ref 0.6–1.0)
Globulin: 2.6 g/dL (ref 2.0–3.6)
Glucose: 101 mg/dL — ABNORMAL HIGH (ref 70–100)
Potassium: 3.6 (ref 3.5–5.1)
Protein, Total: 5 g/dL — ABNORMAL LOW (ref 6.0–8.3)
Sodium: 141 (ref 136–145)

## 2012-07-21 LAB — VANCOMYCIN, TROUGH: Vancomycin Trough: 8.1 ug/mL — ABNORMAL LOW (ref 10.0–20.0)

## 2012-07-21 LAB — GFR: EGFR: >60

## 2012-07-21 LAB — HEMOLYSIS INDEX: Hemolysis Index: 9 (ref 0–18)

## 2012-07-21 MED ORDER — VANCOMYCIN HCL 1000 MG IV SOLR
1250.00 mg | Freq: Three times a day (TID) | INTRAVENOUS | Status: DC
Start: 2012-07-21 — End: 2012-07-22
  Administered 2012-07-21 – 2012-07-22 (×5): 1250 mg via INTRAVENOUS
  Filled 2012-07-21 (×5): qty 1250

## 2012-07-21 MED ORDER — LIDOCAINE HCL 1 % IJ SOLN
INTRAMUSCULAR | Status: AC
Start: 2012-07-21 — End: 2012-07-21
  Filled 2012-07-21: qty 20

## 2012-07-21 MED ORDER — SODIUM CHLORIDE 0.9 % IV MBP
500.00 mg | Freq: Three times a day (TID) | INTRAVENOUS | Status: DC
Start: 2012-07-21 — End: 2012-07-21

## 2012-07-21 MED ORDER — TBO-FILGRASTIM 300 MCG/0.5ML SC SOSY
300.0000 ug | PREFILLED_SYRINGE | Freq: Every day | SUBCUTANEOUS | Status: DC
Start: 2012-07-21 — End: 2012-07-27
  Administered 2012-07-21 – 2012-07-26 (×6): 300 ug via SUBCUTANEOUS
  Filled 2012-07-21 (×7): qty 0.5

## 2012-07-21 NOTE — Consults (Signed)
Nutritional Support Services  Nutrition Follow-up    [name] 57 y.o. female   MRN: [id number]    Summary of Nutrition Recommendations:  1) Continue current diet order  2) Continue 6 small meals with milk shake at lunch (ensure plus, ice cream, banana)   3) continue yogurt, applesauce, magic cup, peanut butter and graham crackers for snacks in between meals   4) Please weigh patient weekly   -----------------------------------------------------------------------------------------------------------------                                                     ASSESSMENT DATA     Nutrition: F/U from previous RD assessment (7/2) for malnutrition and increased nutrient needs. Pt with spiking fever and c/o poor appetite and worrying about loss of muscle mass  Events of Current Admission: Pt diagnosed with leukemia during this admission, (6/20) tunneled central line placed, finished 1st chemo, still neutropenia precaution, chem in progress , bone bx today   Medical Hx:  has a past medical history of Asthma without status asthmaticus.  Social Hx: alcohol socially      Current Diet Order  Diet neutropenic - low bacteria  Intake: varies.  Eats well at breakfast but poor at lunch and dinner. Tried fruit smoothies and milk shakes with ensure, ice cream but sometime too full to drink after eating a meal    ANTHROPOMETRIC  Anthropometrics  Height: 165.1 cm (5\' 5" )  Weight: 45.36 kg (100 lb)  Weight Change: -9.91   IBW/kg (Calculated) Female: 61.83 kg  IBW/kg (Calculated) Female: 56.81 kg  BMI (calculated): 18.5   UBW= 115 lb.  Pt is currently 87% UBW of 115 lb  No new weight  Physical Appearance: pt appears underwt   Skin: none noted  GI function:+BM on 6/25     ESTIMATED NEEDS  Estimated Energy Needs  Total Energy Estimated Needs: 5409-8119 kcal/day  Method for Estimating Needs: 30-35 kcal/kg  Estimated Protein Needs  Total Protein Estimated Needs: 50-65 gm pro/day  Method for Estimating Needs: 1-1.3 gm pro/kg  Fluid  Needs  Total Fluid Estimated Needs: ~1500 ml/day or per MD  Method for Estimating Needs: ~30 ml/kg    Pertinent Medications: decadron, risaquad, miralax  IVF:         . sodium chloride 50 mL/hr at 07/19/12 1729   . sodium chloride         Pertinent labs:    Lab 07/21/12 0559 07/20/12 0606 07/19/12 0603 07/18/12 1815 07/17/12 0559 07/16/12 0456 07/15/12 0607   NA 141 140 139 -- -- 139 140   K 3.6 3.7 3.9 -- -- 4.1 4.0   CL 111* 111* 106 -- -- 106 107   CO2 27 26 27  -- -- 28 28   BUN 8.0 8.0 7.0 -- -- 14.0 12.0   CREAT 0.5* 0.5* 0.5* -- -- 0.6 0.5*   GLU 101* 103* 101* -- -- 86 87   CA 8.4* 8.6 9.2 -- -- 9.4 9.1   MG -- -- -- -- -- -- --   PHOS -- -- -- -- -- -- --   EGFR >60.0 >60.0 >60.0 -- -- >60.0 >60.0   WBC 0.44* 0.39* 0.27* 0.27* 0.49* -- --   HCT 22.1* 22.5* 24.8* 25.8* 27.1* -- --   HGB 7.6* 7.8* 8.5* 8.8* 9.0* -- --   TRIG -- -- -- -- -- -- --  AMY -- -- -- -- -- -- --   LIP -- -- -- -- -- -- --     Blood sugars: 86-103 excellent     Learning Needs: Cancer booklet given to pt during previous RD visit (6/20)  Small frequent meals to maximize po intake (7/2)                                                    NUTRITION DIAGNOSIS     Increased nutrient needs related to medical condition- Leukemia as evidenced by increased estimated needs: Ongoing     Moderate malnutrition related to acute/chronic illness as evidenced by 87% UBW of 115 lb, 15 lb weight loss noted compared to usual body weight (13%) considered a significant amount, visible low muscle tone: ongoing                                                           INTERVENTION     Goal: Obtain adequate nutrition and halt weight loss    1) Continue current diet order  2) Continue 6 small meals with milk shake at lunch (ensure plus, ice cream, banana)   3) continue yogurt, applesauce, magic cup, peanut butter and graham crackers for snacks in between meals   4) please check weight weekly                                                         MONITOR/EVALUATION     Monitor po intake, nutritional side effects of chemotherapy, GI fxn, labs, weight    RD to follow pt at moderate nutrition risk    Royetta Car, MS, RD, CDE  [phone] (office)  [phone] (spectra link)

## 2012-07-21 NOTE — Progress Notes (Addendum)
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  Not in remission.  Received induction with 7+3.   Did very well.  Today is day 14 marrow.   2- Day 14 marrow.  Patient was informed and consented.  Procedure done at bed side with assistance from technician and nursing. Patient placed in ventral decubitus.  Right posterior iliac crest located.  Skin prepared with Betadyne.  1% lidocaine used for local anesthetic and Morphine Sulfate 2 mg IV used for systemic analgesia.  Procedure went uneventfully.  Patient tolerance excellent.  Both biopsy and aspirate obtained and sent for morphology, flow, cytogenetics, and PCR for FLT-3 and NPM1 mutations.  Will add Neupogen after marrow is done, and decided wether to stop it or not after pathologic report is made available.   3- Neutropenic fever.  Continue antibiotics as per Dr. Janalyn Rouse.  4- Pancytopenia.  Transfuse as needed.  Neutropenic precausions.  5- Small bilateral pleural effusion. Follow.  6- Erythema at Groshong catheter entry site. Follow.  OK with hydrocortisone topical.  7- Chondromalacia Patellae.    HISTORY:    [name] is a 57 year old who present with fevers, arthralgias, myalgias, and some rhinorrhea.   A CBC at the time of admission revealed a WBC of 0.84, hemoglobin 11.4, hematocrit 34.2 and a platelet count of 107.  Her absolute neutrophil count was 0.39.  A bone marrow showed AML.     INTERVAL HISTORY:    She continues to spike low grade temperatures.  She is tired. No diarrhea. No SOB, back or chest pain.     PHYSICAL EXAM:     Filed Vitals:    07/21/12 0836   BP: 99/66   Pulse: 75   Temp: 98.3 F (36.8 C)   Resp: 16   SpO2: 95%     Appearance: In no acute distress  HEENT: Pharynx clear  Lungs: Clear to ausculation  Heart: Regular rate and rhythm, normal heart sounds.  Groshong catheter in place.   Abdomen: Flat  and soft, active bowel sounds  Extremities: No edema  Neuro: Alert, appropriate    MEDS:   Scheduled Meds:  Current Facility-Administered Medications   Medication Dose Route Frequency   . acyclovir  400 mg Oral Q12H SCH   . docusate sodium  100 mg Oral Daily   . fluconazole  100 mg Intravenous Q24H SCH   . hydrocortisone   Topical BID   . lactobacillus/streoptococcus  1 capsule Oral Daily   . lidocaine       . meropenem  500 mg Intravenous Q8H SCH   . pantoprazole  40 mg Oral QAM AC   . polyethylene glycol  17 g Oral Daily   . vancomycin  1,000 mg Intravenous Q8H   . [DISCONTINUED] fluconazole  400 mg Intravenous Q24H SCH     Continuous Infusions:       . sodium chloride 50 mL/hr at 07/19/12 1729   . sodium chloride       PRN Meds:.sodium chloride, acetaminophen, acetaminophen, ALPRAZolam, alteplase, alum & mag hydroxide-simethicone, bisacodyl, diphenhydrAMINE, diphenhydrAMINE-zinc acetate, EPINEPHrine, EPINEPHrine, hydrocortisone, lactulose, lidocaine, loperamide, LORazepam, LORazepam, Magic Mouthwash, morphine, naloxone, simethicone, sodium phosphate    LABS:     Results     Procedure Component Value Units Date/Time    Comprehensive metabolic panel [[id number]]  (Abnormal) Collected:07/21/12 0559  Specimen Information:Blood Updated:07/21/12 0654     Glucose 101 (H) mg/dL      BUN 8.0 mg/dL      Creatinine 0.5 (L) mg/dL      Sodium 161      Potassium 3.6      Chloride 111 (H)      CO2 27      CALCIUM 8.4 (L) mg/dL      Protein, Total 5.0 (L) g/dL      Albumin 2.4 (L) g/dL      AST (SGOT) 12 U/L      ALT 57 (H) U/L      Alkaline Phosphatase 54 U/L      Bilirubin, Total 0.5 mg/dL      Globulin 2.6 g/dL      Albumin/Globulin Ratio 0.9      Anion Gap 3.0 (L)     HEMOLYZED INDEX [[id number]] Collected:07/21/12 0559     Hemolyzed Index 9 Updated:07/21/12 0654    GFR [[id number]] Collected:07/21/12 0559     EGFR >60.0 Updated:07/21/12 0654    CBC and differential [[id number]]  (Abnormal) Collected:07/21/12 0559    Specimen  Information:Blood / Blood Updated:07/21/12 0648     WBC 0.44 (L)      RBC 2.31 (L)      Hgb 7.6 (L) g/dL      Hematocrit 57.8 (L) %      MCV 95.7 fL      MCH 32.9 (H) pg      MCHC 34.4 g/dL      RDW 13 %      Platelets 25 (L)      MPV 10.3 fL      Neutrophils 0 %      Lymphocytes Automated 100 %      Monocytes 0 %      Eosinophils Automated 0 %      Basophils Automated 0 %      Immature Granulocyte 0 %      Nucleated RBC 0      Neutrophils Absolute 0.00 (L)      Abs Lymph Automated 0.44 (L)      Abs Mono Automated 0.00      Abs Eos Automated 0.00      Absolute Baso Automated 0.00      Absolute Immature Granulocyte 0.00     Blood culture [[id number]] Collected:07/18/12 1815    Specimen Information:Blood / Blood Updated:07/20/12 2321    Narrative:    ORDER#: [id number]                                    ORDERED BY: CHOUDHARY, IMTI  SOURCE: Blood PERIPHERAL                             COLLECTED:  07/18/12 18:15  ANTIBIOTICS AT COLL.:                                RECEIVED :  07/18/12 22:51  Culture Blood                              PRELIM      07/20/12 23:21  07/19/12   No Growth after 1 day/s of incubation.  07/20/12   No Growth after 2 day/s  of incubation.      Blood culture [[id number]] Collected:07/18/12 1832    Specimen Information:Blood / Blood Updated:07/20/12 2321    Narrative:    ORDER#: [id number]                                    ORDERED BY: Janalyn Rouse, IMTI  SOURCE: Blood R CHEST CENTRAL LINE                   COLLECTED:  07/18/12 18:32  ANTIBIOTICS AT COLL.:                                RECEIVED :  07/18/12 22:51  Culture Blood                              PRELIM      07/20/12 23:21  07/19/12   No Growth after 1 day/s of incubation.  07/20/12   No Growth after 2 day/s of incubation.            IMAGING DATA:  Xr Chest 2 Views    06/26/2012   CLINICAL INDICATION: pneumonia  COMPARISON: None available  FINDINGS:   2 views of the chest were obtained. The lungs are clear. The heart and vascularity are within  normal limits.  There is no pleural thickening or effusion. The osseous structures are unremarkable.      06/26/2012   No active cardiopulmonary disease   Heron Nay, MD  06/26/2012 11:12 PM     Chest 2 Views    06/25/2012  CLINICAL INDICATION: Fever  COMPARISON: None.  INTERPRETATION: Frontal and lateral views of the chest obtained. Cardiomediastinal contour within normal limits for age. Clear lungs and sharp sulci.           06/25/2012   No acute cardiopulmonary process.  Filbert Schilder, MD  06/25/2012 10:43 PM      Sharmon Revere, MD  [facility]

## 2012-07-21 NOTE — Plan of Care (Signed)
Problem: Pain  Goal: Patient's pain/discomfort is manageable  Outcome: Progressing  Patient continues to deny any pain at this time.  VS are stable and patient continues to be afebrile.  No signs of respiratory distress observed or reported at this time.    Problem: Protective Precautions [in description add neutropenic precautions]  Goal: Free from nosocomial infections  Outcome: Progressing  Neutropenic precautions still in effect.      Problem: Anxiety  Goal: Anxiety is a manageable level  Outcome: Progressing  Patient appears more relaxed and slept well during the shift.

## 2012-07-21 NOTE — Progress Notes (Signed)
Daily PROGRESS NOTE    Date Time: 07/21/2012 8:52 PM  Patient Name: [name], [name]  Patient Status: Inpatient  Hospital Day: 26    Assessment:   Acute Leukemia, NOS   Neutropenic fever  .Pancytopenia.   .Weight loss with a body mass index of 18 shows mild to moderate   Malnutrition  . Skin rash.  Epigastric pain  Sinus brady    Plan:   1. Chemo in progress  2. Transfused Platelets, now stable  3. Neutropenic precaution  4. Prophylactic Abx per ID  5. Merrrem, Vancomycin, acyclovir, diflucan  6. Transfuse RBCs, Platelets as needed  7. Nutrition supplement    Subjective:    Fatigue better today, bloating persists  10 point Review of Systems - Negative except for the Positives mentioned above      Medications:     Current Facility-Administered Medications   Medication Dose Route Frequency   . acyclovir  400 mg Oral Q12H SCH   . docusate sodium  100 mg Oral Daily   . fluconazole  100 mg Intravenous Q24H SCH   . hydrocortisone   Topical BID   . lactobacillus/streoptococcus  1 capsule Oral Daily   . lidocaine       . meropenem  500 mg Intravenous Q8H SCH   . pantoprazole  40 mg Oral QAM AC   . polyethylene glycol  17 g Oral Daily   . tbo-filgrastim  300 mcg Subcutaneous Daily   . vancomycin  1,250 mg Intravenous Q8H   . [DISCONTINUED] vancomycin  1,000 mg Intravenous Q8H          sodium chloride 200 mL/hr Continuous PRN   acetaminophen 650 mg Q4H PRN   acetaminophen 650 mg Q4H PRN   ALPRAZolam 0.25 mg QHS PRN   alteplase 2 mg Once PRN   alum & mag hydroxide-simethicone 30 mL Q4H PRN   bisacodyl 5 mg QD PRN   diphenhydrAMINE 50 mg PRN   diphenhydrAMINE-zinc acetate  TID PRN   EPINEPHrine 0.3 mg PRN   EPINEPHrine 0.1 mg PRN   hydrocortisone 100 mg PRN   lactulose 20 g BID PRN   lidocaine  PRN   loperamide 2 mg PRN   LORazepam 1 mg Q4H PRN   Or     LORazepam 1 mg Q4H PRN   Magic Mouthwash 30 mL Q4H PRN   morphine 2 mg Q2H PRN   naloxone 0.2 mg PRN   simethicone 80 mg Q6H PRN   sodium phosphate 1 enema QD PRN         Physical Exam:     Filed Vitals:    07/21/12 1640   BP: 115/56   Pulse: 88   Temp: 99.5 F (37.5 C)   Resp: 16   SpO2: 100%       Intake and Output Summary (Last 24 hours) at Date Time  No intake or output data in the 24 hours ending 07/21/12 2052    Physical Exam   Constitutional: She is oriented to person, place, and time. She appears well-developed.   HENT: No oral lesions seen  Head: Normocephalic and atraumatic.   Eyes: Pupils are equal, round, and reactive to light.   Neck: No JVD present. No tracheal deviation present. No thyromegaly present.   Cardiovascular: Normal rate and normal heart sounds.    Pulmonary/Chest: No respiratory distress. She has no wheezes. She has no rales.   Abdominal: She exhibits no distension. There is no tenderness. There is no rebound and  no guarding.   Musculoskeletal: She exhibits no edema.   Neurological: She is alert and oriented to person, place, and time.           Labs:     Results     Procedure Component Value Units Date/Time    Vancomycin, trough [[id number]]  (Abnormal) Collected:07/21/12 1001    Specimen Information:Blood Updated:07/21/12 1049     Vancomycin Trough 8.1 (L) ug/mL      Vancomycin Time of Last Dose *      Vancomycin Date of Last Dose 07/21/2012     Narrative:    Trough prior to 10 am dose    Comprehensive metabolic panel [[id number]]  (Abnormal) Collected:07/21/12 0559    Specimen Information:Blood Updated:07/21/12 0654     Glucose 101 (H) mg/dL      BUN 8.0 mg/dL      Creatinine 0.5 (L) mg/dL      Sodium 469      Potassium 3.6      Chloride 111 (H)      CO2 27      CALCIUM 8.4 (L) mg/dL      Protein, Total 5.0 (L) g/dL      Albumin 2.4 (L) g/dL      AST (SGOT) 12 U/L      ALT 57 (H) U/L      Alkaline Phosphatase 54 U/L      Bilirubin, Total 0.5 mg/dL      Globulin 2.6 g/dL      Albumin/Globulin Ratio 0.9      Anion Gap 3.0 (L)     HEMOLYZED INDEX [[id number]] Collected:07/21/12 0559     Hemolyzed Index 9 Updated:07/21/12 0654    GFR [[id number]]  Collected:07/21/12 0559     EGFR >60.0 Updated:07/21/12 0654    CBC and differential [[id number]]  (Abnormal) Collected:07/21/12 0559    Specimen Information:Blood / Blood Updated:07/21/12 0648     WBC 0.44 (L)      RBC 2.31 (L)      Hgb 7.6 (L) g/dL      Hematocrit 74.2 (L) %      MCV 95.7 fL      MCH 32.9 (H) pg      MCHC 34.4 g/dL      RDW 13 %      Platelets 25 (L)      MPV 10.3 fL      Neutrophils 0 %      Lymphocytes Automated 100 %      Monocytes 0 %      Eosinophils Automated 0 %      Basophils Automated 0 %      Immature Granulocyte 0 %      Nucleated RBC 0      Neutrophils Absolute 0.00 (L)      Abs Lymph Automated 0.44 (L)      Abs Mono Automated 0.00      Abs Eos Automated 0.00      Absolute Baso Automated 0.00      Absolute Immature Granulocyte 0.00     Blood culture [[id number]] Collected:07/18/12 1815    Specimen Information:Blood / Blood Updated:07/20/12 2321    Narrative:    ORDER#: [id number]                                    ORDERED BY: CHOUDHARY, IMTI  SOURCE: Blood PERIPHERAL  COLLECTED:  07/18/12 18:15  ANTIBIOTICS AT COLL.:                                RECEIVED :  07/18/12 22:51  Culture Blood                              PRELIM      07/20/12 23:21  07/19/12   No Growth after 1 day/s of incubation.  07/20/12   No Growth after 2 day/s of incubation.      Blood culture [[id number]] Collected:07/18/12 1832    Specimen Information:Blood / Blood Updated:07/20/12 2321    Narrative:    ORDER#: [id number]                                    ORDERED BY: Janalyn Rouse, IMTI  SOURCE: Blood R CHEST CENTRAL LINE                   COLLECTED:  07/18/12 18:32  ANTIBIOTICS AT COLL.:                                RECEIVED :  07/18/12 22:51  Culture Blood                              PRELIM      07/20/12 23:21  07/19/12   No Growth after 1 day/s of incubation.  07/20/12   No Growth after 2 day/s of incubation.              Rads:     Xr Chest 2 Views    06/26/2012   CLINICAL INDICATION:  pneumonia  COMPARISON: None available  FINDINGS:   2 views of the chest were obtained. The lungs are clear. The heart and vascularity are within normal limits.  There is no pleural thickening or effusion. The osseous structures are unremarkable.      06/26/2012   No active cardiopulmonary disease   Heron Nay, MD  06/26/2012 11:12 PM     Chest 2 Views    06/25/2012  CLINICAL INDICATION: Fever  COMPARISON: None.  INTERPRETATION: Frontal and lateral views of the chest obtained. Cardiomediastinal contour within normal limits for age. Clear lungs and sharp sulci.           06/25/2012   No acute cardiopulmonary process.  Filbert Schilder, MD  06/25/2012 10:43 PM         Signed by: Eric Form, MD  Pager: [phone]

## 2012-07-21 NOTE — Progress Notes (Signed)
Infectious Diseases & Tropical Medicine  Progress Note    07/21/2012   [name] BJY:[id number]:[id number] is a 57 y.o. female,       Assessment:     Acute leukemia - s/p first chemotherapy  Groshong catheter in place  Pancytopenia / neutropenia  Elevated LFT's  improved  CXR -small bilateral pleural effusions  Fever improving  Rash at R chest/back  S/P bone marrow biopsy today    Plan:      Continue vancomycin and meropenem   Pharmacy monitoring vancomycin levels   Tolerating well   Hydrocortisone ointment to upper chest rash   D/W patient - continue monitoring rash at R chest and back   Continue diflucan and acyclovir    Continue probiotics   Isolation precautions including gowns   Reviewed notes    ROS:     General:  Fever improving, no chills, no rigor,sleepy post procedure  HEENT: no neck pain, no throat pain  Endocrine:c/o fatigue  Respiratory: no cough,no shortness of breath, or wheezing   Cardiovascular: no chest pain   Gastrointestinal: no abdominal pain,no N/V/D  Genito-Urinary: no dysuria, trouble voiding, or hematuria  Musculoskeletal: no edema, myalgias, leg swellling  Neurological: c/o generalized weakness  Dermatological:  Rash at R chest and back, no ulcer    Physical Examination:     Blood pressure 99/66, pulse 75, temperature 98.3 F (36.8 C), temperature source Oral, resp. rate 16, height 1.651 m (5\' 5" ), weight 45.36 kg (100 lb), SpO2 95.00%.     General Appearance: Comfortable, and in no acute distress.sleepy   HEENT: Pupils are equal, round, and reactive to light.    Lungs: CTA   Heart: RRR   Chest: Symmetric chest wall expansion.minimal rash at anterior chest and back    Abdomen: soft ,non tender,no hepatosplenomegaly   Neurological: No focal deficit   Extremities: No edema    Laboratory And Diagnostic Studies:     Recent Labs   [facility] 07/21/12 0559 07/20/12 0606    WBC 0.44* 0.39*    HGB 7.6* 7.8*    HCT 22.1* 22.5*    PLT 25* 37*     Recent Labs   [facility] 07/21/12  0559 07/20/12 0606    NA 141 140    K 3.6 3.7    CL 111* 111*    CO2 27 26    BUN 8.0 8.0    CREAT 0.5* 0.5*    GLU 101* 103*    CA 8.4* 8.6     Recent Labs   [facility] 07/21/12 0559 07/20/12 0606    AST 12 21    ALT 57* 73*    ALKPHOS 54 56    PROT 5.0* 5.2*    ALB 2.4* 2.5*       Current Meds:      Scheduled Meds: PRN Meds:           acyclovir 400 mg Oral Q12H SCH   docusate sodium 100 mg Oral Daily   fluconazole 100 mg Intravenous Q24H SCH   hydrocortisone  Topical BID   lactobacillus/streoptococcus 1 capsule Oral Daily   lidocaine      meropenem 500 mg Intravenous Q8H SCH   pantoprazole 40 mg Oral QAM AC   polyethylene glycol 17 g Oral Daily   tbo-filgrastim 300 mcg Subcutaneous Daily   vancomycin 1,000 mg Intravenous Q8H   [DISCONTINUED] fluconazole 400 mg Intravenous Q24H SCH       Continuous Infusions:       .  sodium chloride 50 mL/hr at 07/19/12 1729   . sodium chloride           sodium chloride 200 mL/hr Continuous PRN   acetaminophen 650 mg Q4H PRN   acetaminophen 650 mg Q4H PRN   ALPRAZolam 0.25 mg QHS PRN   alteplase 2 mg Once PRN   alum & mag hydroxide-simethicone 30 mL Q4H PRN   bisacodyl 5 mg QD PRN   diphenhydrAMINE 50 mg PRN   diphenhydrAMINE-zinc acetate  TID PRN   EPINEPHrine 0.3 mg PRN   EPINEPHrine 0.1 mg PRN   hydrocortisone 100 mg PRN   lactulose 20 g BID PRN   lidocaine  PRN   loperamide 2 mg PRN   LORazepam 1 mg Q4H PRN   Or     LORazepam 1 mg Q4H PRN   Magic Mouthwash 30 mL Q4H PRN   morphine 2 mg Q2H PRN   naloxone 0.2 mg PRN   simethicone 80 mg Q6H PRN   sodium phosphate 1 enema QD PRN         Dryden Tapley A. Janalyn Rouse, M.D.  07/21/2012  9:56 AM

## 2012-07-21 NOTE — Plan of Care (Signed)
Problem: Pain  Goal: Patient's pain/discomfort is manageable  Outcome: Progressing  Pt given morphine before biopsy.  Slight discomfort afterward.  No complaints of pain.    Problem: Protective Precautions [in description add neutropenic precautions]  Goal: Free from nosocomial infections  Outcome: Progressing  No fevers during shift.  Pt remained in room though out day.

## 2012-07-22 LAB — MAN DIFF ONLY
Band Neutrophils Absolute: 0 (ref 0.00–1.00)
Band Neutrophils: 0 %
Basophils Absolute Manual: 0 (ref 0.00–0.20)
Basophils Manual: 0 %
Eosinophils Absolute Manual: 0 (ref 0.00–0.70)
Eosinophils Manual: 0 %
Lymphocytes Absolute Manual: 0.24 — ABNORMAL LOW (ref 0.50–4.40)
Lymphocytes Manual: 100 %
Monocytes Absolute: 0 (ref 0.00–1.20)
Monocytes Manual: 0 %
Neutrophils Absolute Manual: 0 — ABNORMAL LOW (ref 1.80–8.10)
Nucleated RBC: 0 (ref 0–1)
Segmented Neutrophils: 0 %

## 2012-07-22 LAB — CBC AND DIFFERENTIAL
Hematocrit: 21.3 % — ABNORMAL LOW (ref 37.0–47.0)
Hgb: 7.3 g/dL — ABNORMAL LOW (ref 12.0–16.0)
MCH: 32.4 pg — ABNORMAL HIGH (ref 28.0–32.0)
MCHC: 34.3 g/dL (ref 32.0–36.0)
MCV: 94.7 fL (ref 80.0–100.0)
MPV: 10.2 fL (ref 9.4–12.3)
Platelets: 20 — ABNORMAL LOW (ref 140–400)
RBC: 2.25 — ABNORMAL LOW (ref 4.20–5.40)
RDW: 13 % (ref 12–15)
WBC: 0.24 — ABNORMAL LOW (ref 3.50–10.80)

## 2012-07-22 LAB — URINALYSIS WITH MICROSCOPIC
Bilirubin, UA: NEGATIVE
Blood, UA: NEGATIVE
Glucose, UA: NEGATIVE
Ketones UA: NEGATIVE
Leukocyte Esterase, UA: NEGATIVE
Nitrite, UA: NEGATIVE
Protein, UR: NEGATIVE
Specific Gravity UA: 1.013 (ref 1.001–1.035)
Urine pH: 8 (ref 5.0–8.0)
Urobilinogen, UA: NEGATIVE mg/dL

## 2012-07-22 LAB — COMPREHENSIVE METABOLIC PANEL
ALT: 46 U/L (ref 0–55)
AST (SGOT): 11 U/L (ref 5–34)
Albumin/Globulin Ratio: 1 (ref 0.9–2.2)
Albumin: 2.4 g/dL — ABNORMAL LOW (ref 3.5–5.0)
Alkaline Phosphatase: 50 U/L (ref 40–150)
Anion Gap: 6 (ref 5.0–15.0)
BUN: 8 mg/dL (ref 7.0–19.0)
Bilirubin, Total: 0.5 mg/dL (ref 0.2–1.2)
CO2: 25 (ref 22–29)
Calcium: 8.3 mg/dL — ABNORMAL LOW (ref 8.5–10.5)
Chloride: 108 — ABNORMAL HIGH (ref 98–107)
Creatinine: 0.5 mg/dL — ABNORMAL LOW (ref 0.6–1.0)
Globulin: 2.5 g/dL (ref 2.0–3.6)
Glucose: 102 mg/dL — ABNORMAL HIGH (ref 70–100)
Potassium: 3.7 (ref 3.5–5.1)
Protein, Total: 4.9 g/dL — ABNORMAL LOW (ref 6.0–8.3)
Sodium: 139 (ref 136–145)

## 2012-07-22 LAB — GFR: EGFR: >60

## 2012-07-22 LAB — VANCOMYCIN, TROUGH: Vancomycin Trough: 9.3 ug/mL — ABNORMAL LOW (ref 10.0–20.0)

## 2012-07-22 LAB — HEMOLYSIS INDEX: Hemolysis Index: 17 (ref 0–18)

## 2012-07-22 LAB — CELL MORPHOLOGY: Cell Morphology: NORMAL

## 2012-07-22 MED ORDER — VANCOMYCIN HCL 1000 MG IV SOLR
1500.0000 mg | Freq: Three times a day (TID) | INTRAVENOUS | Status: DC
Start: 2012-07-23 — End: 2012-07-23
  Administered 2012-07-23: 1500 mg via INTRAVENOUS
  Filled 2012-07-22: qty 1500

## 2012-07-22 MED ORDER — VANCOMYCIN HCL 1000 MG IV SOLR
1500.00 mg | Freq: Three times a day (TID) | INTRAVENOUS | Status: DC
Start: 2012-07-22 — End: 2012-07-22
  Filled 2012-07-22 (×2): qty 1500

## 2012-07-22 MED ORDER — METRONIDAZOLE IN NACL 500 MG/100 ML IV SOLN
500.0000 mg | Freq: Three times a day (TID) | INTRAVENOUS | Status: DC
Start: 2012-07-22 — End: 2012-07-23
  Administered 2012-07-22 – 2012-07-23 (×3): 500 mg via INTRAVENOUS
  Filled 2012-07-22 (×3): qty 100

## 2012-07-22 MED ORDER — SODIUM CHLORIDE 0.9 % IV MBP
1.00 g | Freq: Three times a day (TID) | INTRAVENOUS | Status: DC
Start: 2012-07-22 — End: 2012-07-23
  Administered 2012-07-22 – 2012-07-23 (×3): 1 g via INTRAVENOUS
  Filled 2012-07-22 (×4): qty 1

## 2012-07-22 NOTE — Progress Notes (Signed)
Daily PROGRESS NOTE    Date Time: 07/22/2012 10:43 PM  Patient Name: [name], [name]  Patient Status: Inpatient  Hospital Day: 27    Assessment:   Acute Leukemia, NOS   Neutropenic fever  .Pancytopenia.   .Weight loss with a body mass index of 18 shows mild to moderate   Malnutrition  . Skin rash.  Epigastric pain  Sinus brady    Plan:   1. Chemo in progress  2. Transfused Platelets, now stable  3. Neutropenic precaution  4. D/cMerrrem, Vancomycin for possible reaction  5. acyclovir, diflucan, Flagyl and Fortaz  6. Transfuse RBCs, Platelets as needed  7. Nutrition supplement    Subjective:    Fatigue, fever, skin rashes persists  10 point Review of Systems - Negative except for the Positives mentioned above      Medications:     Current Facility-Administered Medications   Medication Dose Route Frequency   . acyclovir  400 mg Oral Q12H SCH   . ceftAZIDime  1 g Intravenous Q8H   . docusate sodium  100 mg Oral Daily   . fluconazole  100 mg Intravenous Q24H SCH   . hydrocortisone   Topical BID   . lactobacillus/streoptococcus  1 capsule Oral Daily   . metroNIDAZOLE  500 mg Intravenous Q8H   . pantoprazole  40 mg Oral QAM AC   . polyethylene glycol  17 g Oral Daily   . tbo-filgrastim  300 mcg Subcutaneous Daily   . vancomycin  1,500 mg Intravenous Q8H   . [DISCONTINUED] meropenem  500 mg Intravenous Q8H SCH   . [DISCONTINUED] vancomycin  1,250 mg Intravenous Q8H          sodium chloride 200 mL/hr Continuous PRN   acetaminophen 650 mg Q4H PRN   acetaminophen 650 mg Q4H PRN   ALPRAZolam 0.25 mg QHS PRN   alteplase 2 mg Once PRN   alum & mag hydroxide-simethicone 30 mL Q4H PRN   bisacodyl 5 mg QD PRN   diphenhydrAMINE 50 mg PRN   diphenhydrAMINE-zinc acetate  TID PRN   EPINEPHrine 0.3 mg PRN   EPINEPHrine 0.1 mg PRN   hydrocortisone 100 mg PRN   lactulose 20 g BID PRN   lidocaine  PRN   loperamide 2 mg PRN   LORazepam 1 mg Q4H PRN   Or     LORazepam 1 mg Q4H PRN   Magic Mouthwash 30 mL Q4H PRN   morphine 2 mg Q2H PRN    naloxone 0.2 mg PRN   simethicone 80 mg Q6H PRN   sodium phosphate 1 enema QD PRN        Physical Exam:     Filed Vitals:    07/22/12 2130   BP:    Pulse:    Temp: 102.1 F (38.9 C)   Resp:    SpO2:        Intake and Output Summary (Last 24 hours) at Date Time  No intake or output data in the 24 hours ending 07/22/12 2243    Physical Exam   Constitutional: She is oriented to person, place, and time. She appears well-developed.   HENT: No oral lesions seen  Head: Normocephalic and atraumatic.   Eyes: Pupils are equal, round, and reactive to light.   Neck: No JVD present. No tracheal deviation present. No thyromegaly present.   Cardiovascular: Normal rate and normal heart sounds.    Pulmonary/Chest: No respiratory distress. She has no wheezes. She has no rales.   Abdominal:  She exhibits no distension. There is no tenderness. There is no rebound and no guarding.   Musculoskeletal: She exhibits no edema.   Neurological: She is alert and oriented to person, place, and time.           Labs:     Results     Procedure Component Value Units Date/Time    Vancomycin, trough [[id number]]  (Abnormal) Collected:07/22/12 1958    Specimen Information:Blood Updated:07/22/12 2049     Vancomycin Trough 9.3 (L) ug/mL      Vancomycin Time of Last Dose unk      Vancomycin Date of Last Dose 07/22/2012     Narrative:    Draw Trough level 30 minutes prior to 20:30 dose at 7/9.    Blood culture [[id number]] Collected:07/22/12 1555    Specimen Information:Blood / Blood, Venipuncture Updated:07/22/12 1943    Narrative:    8ml required    Blood culture [[id number]] Collected:07/22/12 1555    Specimen Information:Blood / Blood, Intravenous Line Updated:07/22/12 1943    Narrative:    8ml required    CBC and differential [[id number]]  (Abnormal) Collected:07/22/12 0800    Specimen Information:Blood / Blood Updated:07/22/12 1021     WBC 0.24 (L)      RBC 2.25 (L)      Hgb 7.3 (L) g/dL      Hematocrit 62.9 (L) %      MCV 94.7 fL      MCH 32.4 (H) pg       MCHC 34.3 g/dL      RDW 13 %      Platelets 20 (L)      MPV 10.2 fL     Manual Differential [[id number]]  (Abnormal) Collected:07/22/12 0800     Segmented Neutrophils 0 % Updated:07/22/12 1021     Band Neutrophils 0 %      Lymphocytes Manual 100 %      Monocytes Manual 0 %      Eosinophils Manual 0 %      Basophils Manual 0 %      Nucleated RBC 0      Abs Seg Manual 0.00 (L)      Bands Absolute 0.00      Absolute Lymph Manual 0.24 (L)      Monocytes Absolute 0.00      Absolute Eos Manual 0.00      Absolute Baso Manual 0.00     Cell MorpHology [[id number]] Collected:07/22/12 0800     Cell Morphology: Normal Updated:07/22/12 1021    Comprehensive metabolic panel [[id number]]  (Abnormal) Collected:07/22/12 0800    Specimen Information:Blood Updated:07/22/12 0845     Glucose 102 (H) mg/dL      BUN 8.0 mg/dL      Creatinine 0.5 (L) mg/dL      Sodium 595      Potassium 3.7      Chloride 108 (H)      CO2 25      CALCIUM 8.3 (L) mg/dL      Protein, Total 4.9 (L) g/dL      Albumin 2.4 (L) g/dL      AST (SGOT) 11 U/L      ALT 46 U/L      Alkaline Phosphatase 50 U/L      Bilirubin, Total 0.5 mg/dL      Globulin 2.5 g/dL      Albumin/Globulin Ratio 1.0      Anion Gap 6.0     HEMOLYZED INDEX [[id number]] Collected:07/22/12 0800  Hemolyzed Index 17 Updated:07/22/12 0845    GFR [[id number]] Collected:07/22/12 0800     EGFR >60.0 Updated:07/22/12 0845    Urine culture [[id number]] Collected:07/21/12 2354    Specimen Information:Urine / Urine, Clean Catch Updated:07/22/12 0140    Culture Blood, Aerobic [[id number]] Collected:07/21/12 2247    Specimen Information:Blood / Blood, Intravenous Line Updated:07/22/12 0140    Narrative:    8ml required    Culture Blood, Aerobic [[id number]] Collected:07/21/12 2247    Specimen Information:Blood / Blood, Venipuncture Updated:07/22/12 0140    Narrative:    8ml required    Urinalysis with microscopic [[id number]] Collected:07/21/12 2354    Specimen Information:Urine Updated:07/22/12 0011      Urine Type Clean Catch      Color, UA Straw      Clarity, UA Clear      Specific Gravity UA 1.013      Urine pH 8.0      Leukocyte Esterase, UA Negative      Nitrite, UA Negative      Protein, UA Negative      Glucose, UA Negative      Ketones UA Negative      Urobilinogen, UA Negative mg/dL      Bilirubin, UA Negative      Blood, UA Negative      RBC, UA 0 - 5      WBC, UA 0 - 5     Blood culture [[id number]] Collected:07/18/12 1815    Specimen Information:Blood / Blood Updated:07/21/12 2321    Narrative:    ORDER#: [id number]                                    ORDERED BY: CHOUDHARY, IMTI  SOURCE: Blood PERIPHERAL                             COLLECTED:  07/18/12 18:15  ANTIBIOTICS AT COLL.:                                RECEIVED :  07/18/12 22:51  Culture Blood                              PRELIM      07/21/12 23:21  07/19/12   No Growth after 1 day/s of incubation.  07/20/12   No Growth after 2 day/s of incubation.  07/21/12   No Growth after 3 day/s of incubation.      Blood culture [[id number]] Collected:07/18/12 1832    Specimen Information:Blood / Blood Updated:07/21/12 2321    Narrative:    ORDER#: [id number]                                    ORDERED BY: Janalyn Rouse, IMTI  SOURCE: Blood R CHEST CENTRAL LINE                   COLLECTED:  07/18/12 18:32  ANTIBIOTICS AT COLL.:                                RECEIVED :  07/18/12 22:51  Culture Blood  PRELIM      07/21/12 23:21  07/19/12   No Growth after 1 day/s of incubation.  07/20/12   No Growth after 2 day/s of incubation.  07/21/12   No Growth after 3 day/s of incubation.              Rads:     Xr Chest 2 Views    06/26/2012   CLINICAL INDICATION: pneumonia  COMPARISON: None available  FINDINGS:   2 views of the chest were obtained. The lungs are clear. The heart and vascularity are within normal limits.  There is no pleural thickening or effusion. The osseous structures are unremarkable.      06/26/2012   No active cardiopulmonary  disease   Heron Nay, MD  06/26/2012 11:12 PM     Chest 2 Views    06/25/2012  CLINICAL INDICATION: Fever  COMPARISON: None.  INTERPRETATION: Frontal and lateral views of the chest obtained. Cardiomediastinal contour within normal limits for age. Clear lungs and sharp sulci.           06/25/2012   No acute cardiopulmonary process.  Filbert Schilder, MD  06/25/2012 10:43 PM         Signed by: Eric Form, MD  Pager: [phone]

## 2012-07-22 NOTE — Plan of Care (Signed)
Problem: Health Promotion  Goal: Knowledge - health resources  Extent of understanding and conveyed about healthcare resources.   Outcome: Progressing  Pt had Elevated tempeture of 102, pt was given 2 tabs of Tylenol, and Dr Adela Lank was notified orders given.  Blood culture x2, Urinalysis and Urine Culture, Chest X-Ray and Iv antibiotic Meropenem ordered. All medications including IV Vancomycin and Zovirax given after blood culture was drawn.  Pt's Tempeture has decreased to 99.7  Will continue to mnoitor pt's Vital sign     Problem: Pain  Goal: Patient's pain/discomfort is manageable  Outcome: Progressing  Pt Denies having pain or any discomfort at present     Problem: Infection/Potential for Infection  Goal: Free from infection  Outcome: Progressing  Pt on Neutropenic Isolation,  Will maintain pt on isolation.

## 2012-07-22 NOTE — Plan of Care (Addendum)
Problem: Hemodynamic Status: Immune Suppressed-Hematologic  Goal: Stable vital signs and fluid balance  Outcome: Progressing  Pt denies any pain at this time. Call bell placed within reach. Skin rash noted on the abdomen, medicated cream applied as ordered. Vital sign taken and recorded on the epic. Appetite good. Pt ambulatory to bathroom. Neutropenic isolation maintained.       Patient's temp 102.3, md notified, blood culture times two done as ordered and tylenol, ice chips, and ice pack given temp came down 100.8.

## 2012-07-22 NOTE — Progress Notes (Signed)
[facility]  Progress Note    07/22/2012   [name] JWJ:[id number] is a 57 y.o. female,       Assessment:     Acute leukemia  Groshong catheter in place  Pancytopenia / neutropenia  Elevated LFT's  improved  CXR -small bilateral pleural effusions  Persistent fevers  Rash spreading lower abdomen and back  S/P bone marrow biopsy-empty bone marrow    Plan:      Continue vancomycin    Pharmacy monitoring vancomycin levels   Discontinue meropenem   Start ceftazidime and Flagyl   Continue Diflucan and acyclovir   Continue probiotics   Started on G- CSF   Hydrocortisone ointment to upper chest rash   Isolation precautions including gowns   Follow-up on repeat blood cultures   Reviewed notes   Monitor clinically   Discussed with patient at length   Discussed with pharmacy    ROS:     General:   Persistent fever, no chills, no rigor,c/o being tired  HEENT: no neck pain, no throat pain  Endocrine:c/o fatigue  Respiratory: no cough,no shortness of breath, or wheezing   Cardiovascular: no chest pain   Gastrointestinal: no abdominal pain,no N/V/D  Genito-Urinary: no dysuria, trouble voiding, or hematuria  Musculoskeletal: no edema, myalgias, leg swellling  Neurological: c/o generalized weakness  Dermatological:  Rash at R chest and back now spreading over her abdomen, no ulcer    Physical Examination:     Blood pressure 110/56, pulse 90, temperature 98.9 F (37.2 C), temperature source Oral, resp. rate 16, height 1.651 m (5\' 5" ), weight 51.823 kg (114 lb 4 oz), SpO2 98.00%.     General Appearance: Comfortable, and in no acute distress. Awake and alert   HEENT: Pupils are equal, round, and reactive to light.    Lungs:  Decreased breath sounds   Heart: RRR   Chest: Symmetric chest wall expansion. maculopapular rash at anterior chest,back and over abdomen    Abdomen: soft ,non tender,no hepatosplenomegaly   Neurological: No focal deficit   Extremities: No  edema    Laboratory And Diagnostic Studies:     Recent Labs   [facility] 07/22/12 0800 07/21/12 0559    WBC 0.24* 0.44*    HGB 7.3* 7.6*    HCT 21.3* 22.1*    PLT 20* 25*     Recent Labs   [facility] 07/22/12 0800 07/21/12 0559    NA 139 141    K 3.7 3.6    CL 108* 111*    CO2 25 27    BUN 8.0 8.0    CREAT 0.5* 0.5*    GLU 102* 101*    CA 8.3* 8.4*     Recent Labs   [facility] 07/22/12 0800 07/21/12 0559    AST 11 12    ALT 46 57*    ALKPHOS 50 54    PROT 4.9* 5.0*    ALB 2.4* 2.4*       Current Meds:      Scheduled Meds: PRN Meds:           acyclovir 400 mg Oral Q12H SCH   docusate sodium 100 mg Oral Daily   fluconazole 100 mg Intravenous Q24H SCH   hydrocortisone  Topical BID   lactobacillus/streoptococcus 1 capsule Oral Daily   [EXPIRED] lidocaine      meropenem 500 mg Intravenous Q8H SCH   pantoprazole 40 mg Oral QAM AC   polyethylene glycol 17 g Oral Daily  tbo-filgrastim 300 mcg Subcutaneous Daily   vancomycin 1,250 mg Intravenous Q8H   [DISCONTINUED] meropenem 500 mg Intravenous Q8H   [DISCONTINUED] vancomycin 1,000 mg Intravenous Q8H       Continuous Infusions:       . sodium chloride 50 mL/hr at 07/22/12 0110   . sodium chloride           sodium chloride 200 mL/hr Continuous PRN   acetaminophen 650 mg Q4H PRN   acetaminophen 650 mg Q4H PRN   ALPRAZolam 0.25 mg QHS PRN   alteplase 2 mg Once PRN   alum & mag hydroxide-simethicone 30 mL Q4H PRN   bisacodyl 5 mg QD PRN   diphenhydrAMINE 50 mg PRN   diphenhydrAMINE-zinc acetate  TID PRN   EPINEPHrine 0.3 mg PRN   EPINEPHrine 0.1 mg PRN   hydrocortisone 100 mg PRN   lactulose 20 g BID PRN   lidocaine  PRN   loperamide 2 mg PRN   LORazepam 1 mg Q4H PRN   Or     LORazepam 1 mg Q4H PRN   Magic Mouthwash 30 mL Q4H PRN   morphine 2 mg Q2H PRN   naloxone 0.2 mg PRN   simethicone 80 mg Q6H PRN   sodium phosphate 1 enema QD PRN         Savera Donson A. Janalyn Rouse, M.D.  07/22/2012  10:03 AM

## 2012-07-22 NOTE — Progress Notes (Signed)
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  Day 14 marrow "empty". Started G-CSF.  Awaiting marrow recovery.    2- Neutropenic low grade fever. Patient does not look toxic.  Continue antibiotics as per Dr. Janalyn Rouse.  3- Pancytopenia.  Transfuse as needed as per Hospital guidelines.  Neutropenic precausions.  4- Small bilateral pleural effusion. Follow.  5- Erythema at Groshong catheter entry site. Follow.  OK with hydrocortisone topical.  7- Chondromalacia Patellae.    HISTORY:    [name] is a 57 year old who present with fevers, arthralgias, myalgias, and some rhinorrhea.   A CBC at the time of admission revealed a WBC of 0.84, hemoglobin 11.4, hematocrit 34.2 and a platelet count of 107.  Her absolute neutrophil count was 0.39.  A bone marrow showed AML.     INTERVAL HISTORY:    She looks good.  She has no specific complaints. She is tired. No diarrhea. No SOB, back or chest pain.     PHYSICAL EXAM:     Filed Vitals:    07/22/12 0700   BP:    Pulse:    Temp: 98.9 F (37.2 C)   Resp:    SpO2:      Appearance: In no acute distress  HEENT: Pharynx clear  Lungs: Clear to ausculation  Heart: Regular rate and rhythm, normal heart sounds.  Groshong catheter in place.   Abdomen: Flat and soft, active bowel sounds  Extremities: No edema  Neuro: Alert, appropriate    MEDS:   Scheduled Meds:  Current Facility-Administered Medications   Medication Dose Route Frequency   . acyclovir  400 mg Oral Q12H SCH   . docusate sodium  100 mg Oral Daily   . fluconazole  100 mg Intravenous Q24H SCH   . hydrocortisone   Topical BID   . lactobacillus/streoptococcus  1 capsule Oral Daily   . [EXPIRED] lidocaine       . meropenem  500 mg Intravenous Q8H SCH   . pantoprazole  40 mg Oral QAM AC   . polyethylene glycol  17 g Oral Daily   . tbo-filgrastim  300 mcg Subcutaneous Daily   . vancomycin   1,250 mg Intravenous Q8H   . [DISCONTINUED] meropenem  500 mg Intravenous Q8H   . [DISCONTINUED] vancomycin  1,000 mg Intravenous Q8H     Continuous Infusions:       . sodium chloride 50 mL/hr at 07/22/12 0110   . sodium chloride       PRN Meds:.sodium chloride, acetaminophen, acetaminophen, ALPRAZolam, alteplase, alum & mag hydroxide-simethicone, bisacodyl, diphenhydrAMINE, diphenhydrAMINE-zinc acetate, EPINEPHrine, EPINEPHrine, hydrocortisone, lactulose, lidocaine, loperamide, LORazepam, LORazepam, Magic Mouthwash, morphine, naloxone, simethicone, sodium phosphate    LABS:     Results     Procedure Component Value Units Date/Time    CBC and differential [[id number]] Collected:07/22/12 0800    Specimen Information:Blood / Blood Updated:07/22/12 0819    Comprehensive metabolic panel [[id number]] Collected:07/22/12 0800    Specimen Information:Blood Updated:07/22/12 0819    Urine culture [[id number]] Collected:07/21/12 2354    Specimen Information:Urine / Urine, Clean Catch Updated:07/22/12 0140    Culture Blood, Aerobic [[id number]] Collected:07/21/12 2247    Specimen Information:Blood / Blood, Intravenous Line Updated:07/22/12 0140    Narrative:    8ml required    Culture Blood, Aerobic [[id number]] Collected:07/21/12 2247    Specimen Information:Blood /  Blood, Venipuncture Updated:07/22/12 0140    Narrative:    8ml required    Urinalysis with microscopic [[id number]] Collected:07/21/12 2354    Specimen Information:Urine Updated:07/22/12 0011     Urine Type Clean Catch      Color, UA Straw      Clarity, UA Clear      Specific Gravity UA 1.013      Urine pH 8.0      Leukocyte Esterase, UA Negative      Nitrite, UA Negative      Protein, UA Negative      Glucose, UA Negative      Ketones UA Negative      Urobilinogen, UA Negative mg/dL      Bilirubin, UA Negative      Blood, UA Negative      RBC, UA 0 - 5      WBC, UA 0 - 5     Blood culture [[id number]] Collected:07/18/12 1815    Specimen Information:Blood / Blood  Updated:07/21/12 2321    Narrative:    ORDER#: [id number]                                    ORDERED BY: CHOUDHARY, IMTI  SOURCE: Blood PERIPHERAL                             COLLECTED:  07/18/12 18:15  ANTIBIOTICS AT COLL.:                                RECEIVED :  07/18/12 22:51  Culture Blood                              PRELIM      07/21/12 23:21  07/19/12   No Growth after 1 day/s of incubation.  07/20/12   No Growth after 2 day/s of incubation.  07/21/12   No Growth after 3 day/s of incubation.      Blood culture [[id number]] Collected:07/18/12 1832    Specimen Information:Blood / Blood Updated:07/21/12 2321    Narrative:    ORDER#: [id number]                                    ORDERED BY: Janalyn Rouse, IMTI  SOURCE: Blood R CHEST CENTRAL LINE                   COLLECTED:  07/18/12 18:32  ANTIBIOTICS AT COLL.:                                RECEIVED :  07/18/12 22:51  Culture Blood                              PRELIM      07/21/12 23:21  07/19/12   No Growth after 1 day/s of incubation.  07/20/12   No Growth after 2 day/s of incubation.  07/21/12   No Growth after 3 day/s of incubation.      Vancomycin, trough [[id number]]  (Abnormal) Collected:07/21/12 1001    Specimen Information:Blood Updated:07/21/12 1049  Vancomycin Trough 8.1 (L) ug/mL      Vancomycin Time of Last Dose *      Vancomycin Date of Last Dose 07/21/2012     Narrative:    Trough prior to 10 am dose          IMAGING DATA:  Xr Chest 2 Views    06/26/2012   CLINICAL INDICATION: pneumonia  COMPARISON: None available  FINDINGS:   2 views of the chest were obtained. The lungs are clear. The heart and vascularity are within normal limits.  There is no pleural thickening or effusion. The osseous structures are unremarkable.      06/26/2012   No active cardiopulmonary disease   Heron Nay, MD  06/26/2012 11:12 PM     Chest 2 Views    06/25/2012  CLINICAL INDICATION: Fever  COMPARISON: None.  INTERPRETATION: Frontal and lateral views of the chest  obtained. Cardiomediastinal contour within normal limits for age. Clear lungs and sharp sulci.           06/25/2012   No acute cardiopulmonary process.  Filbert Schilder, MD  06/25/2012 10:43 PM      Sharmon Revere, MD  [facility]

## 2012-07-23 LAB — COMPREHENSIVE METABOLIC PANEL
ALT: 51 U/L (ref 0–55)
AST (SGOT): 22 U/L (ref 5–34)
Albumin/Globulin Ratio: 0.9 (ref 0.9–2.2)
Albumin: 2.4 g/dL — ABNORMAL LOW (ref 3.5–5.0)
Alkaline Phosphatase: 54 U/L (ref 40–150)
Anion Gap: 3 — ABNORMAL LOW (ref 5.0–15.0)
BUN: 7 mg/dL (ref 7.0–19.0)
Bilirubin, Total: 0.4 mg/dL (ref 0.2–1.2)
CO2: 27 (ref 22–29)
Calcium: 8.7 mg/dL (ref 8.5–10.5)
Chloride: 109 — ABNORMAL HIGH (ref 98–107)
Creatinine: 0.5 mg/dL — ABNORMAL LOW (ref 0.6–1.0)
Globulin: 2.7 g/dL (ref 2.0–3.6)
Glucose: 103 mg/dL — ABNORMAL HIGH (ref 70–100)
Potassium: 3.7 (ref 3.5–5.1)
Protein, Total: 5.1 g/dL — ABNORMAL LOW (ref 6.0–8.3)
Sodium: 139 (ref 136–145)

## 2012-07-23 LAB — CBC AND DIFFERENTIAL
Hematocrit: 20.7 % — ABNORMAL LOW (ref 37.0–47.0)
Hgb: 7.3 g/dL — ABNORMAL LOW (ref 12.0–16.0)
MCH: 32.4 pg — ABNORMAL HIGH (ref 28.0–32.0)
MCHC: 35.3 g/dL (ref 32.0–36.0)
MCV: 92 fL (ref 80.0–100.0)
MPV: 9.9 fL (ref 9.4–12.3)
Platelets: 19 — CL (ref 140–400)
RBC: 2.25 — ABNORMAL LOW (ref 4.20–5.40)
RDW: 12 % (ref 12–15)
WBC: 0.31 — ABNORMAL LOW (ref 3.50–10.80)

## 2012-07-23 LAB — VANCOMYCIN, TROUGH: Vancomycin Trough: 13.4 ug/mL (ref 10.0–20.0)

## 2012-07-23 LAB — TYPE AND SCREEN
AB Screen Gel: NEGATIVE
ABO Rh: O POS

## 2012-07-23 LAB — HEMOLYSIS INDEX: Hemolysis Index: 3 (ref 0–18)

## 2012-07-23 LAB — CELL MORPHOLOGY: Cell Morphology: NORMAL

## 2012-07-23 LAB — PREPARE RBC: RBC Leukoreduced Irradiated: TRANSFUSED

## 2012-07-23 LAB — GFR: EGFR: >60

## 2012-07-23 MED ORDER — LEVOFLOXACIN IN D5W 500 MG/100ML IV SOLN
500.00 mg | INTRAVENOUS | Status: DC
Start: 2012-07-23 — End: 2012-07-28
  Administered 2012-07-23 – 2012-07-28 (×6): 500 mg via INTRAVENOUS
  Filled 2012-07-23 (×6): qty 100

## 2012-07-23 MED ORDER — TIGECYCLINE 50 MG IV SOLR
100.00 mg | Freq: Once | INTRAVENOUS | Status: AC
Start: 2012-07-23 — End: 2012-07-23
  Administered 2012-07-23: 100 mg via INTRAVENOUS
  Filled 2012-07-23: qty 100

## 2012-07-23 MED ORDER — METHYLPREDNISOLONE SODIUM SUCC 125 MG IJ SOLR
80.0000 mg | Freq: Three times a day (TID) | INTRAMUSCULAR | Status: DC
Start: 2012-07-23 — End: 2012-07-24
  Administered 2012-07-23 – 2012-07-24 (×3): 80 mg via INTRAVENOUS
  Filled 2012-07-23 (×4): qty 2

## 2012-07-23 MED ORDER — TIGECYCLINE 50 MG IV SOLR
50.00 mg | Freq: Two times a day (BID) | INTRAVENOUS | Status: DC
Start: 2012-07-23 — End: 2012-07-27
  Administered 2012-07-23 – 2012-07-26 (×7): 50 mg via INTRAVENOUS
  Filled 2012-07-23 (×8): qty 50

## 2012-07-23 NOTE — Progress Notes (Signed)
Daily PROGRESS NOTE    Date Time: 07/23/2012 9:58 PM  Patient Name: [name], [name]  Patient Status: Inpatient  Hospital Day: 28    Assessment:   Acute Leukemia, NOS   Neutropenic fever  .Pancytopenia.   .Weight loss with a body mass index of 18 shows mild to moderate   Malnutrition  . Skin rash.  Epigastric pain  Sinus brady  BM with Residual AML, hypocellular marrow    Plan:   1. Await marrow recovery  2. Transfuse as needed, CSGF  3. Neutropenic precaution  4. D/cMerrrem, Vancomycin for possible reaction on sSolumedrol  5. acyclovir, diflucan, Flagyl and Fortaz  6. Monitor Port site, FUP culture    Subjective:    Fatigue, fever, skin rashes persists  10 point Review of Systems - Negative except for the Positives mentioned above      Medications:     Current Facility-Administered Medications   Medication Dose Route Frequency   . acyclovir  400 mg Oral Q12H SCH   . docusate sodium  100 mg Oral Daily   . fluconazole  100 mg Intravenous Q24H SCH   . hydrocortisone   Topical BID   . lactobacillus/streoptococcus  1 capsule Oral Daily   . levofloxacin  500 mg Intravenous Q24H   . methylprednisolone  80 mg Intravenous Q8H SCH   . pantoprazole  40 mg Oral QAM AC   . polyethylene glycol  17 g Oral Daily   . tbo-filgrastim  300 mcg Subcutaneous Daily   . [COMPLETED] tigecycline  100 mg Intravenous Once   . tigecycline  50 mg Intravenous Q12H SCH   . [DISCONTINUED] ceftAZIDime  1 g Intravenous Q8H   . [DISCONTINUED] metroNIDAZOLE  500 mg Intravenous Q8H   . [DISCONTINUED] vancomycin  1,500 mg Intravenous Q8H   . [DISCONTINUED] vancomycin  1,500 mg Intravenous Q8H          sodium chloride 200 mL/hr Continuous PRN   acetaminophen 650 mg Q4H PRN   acetaminophen 650 mg Q4H PRN   ALPRAZolam 0.25 mg QHS PRN   alteplase 2 mg Once PRN   alum & mag hydroxide-simethicone 30 mL Q4H PRN   bisacodyl 5 mg QD PRN   diphenhydrAMINE 50 mg PRN   diphenhydrAMINE-zinc acetate  TID PRN   EPINEPHrine 0.3 mg PRN   EPINEPHrine 0.1 mg PRN    hydrocortisone 100 mg PRN   lactulose 20 g BID PRN   lidocaine  PRN   loperamide 2 mg PRN   LORazepam 1 mg Q4H PRN   Or     LORazepam 1 mg Q4H PRN   Magic Mouthwash 30 mL Q4H PRN   morphine 2 mg Q2H PRN   naloxone 0.2 mg PRN   simethicone 80 mg Q6H PRN   sodium phosphate 1 enema QD PRN        Physical Exam:     Filed Vitals:    07/23/12 1712   BP:    Pulse:    Temp: 100.5 F (38.1 C)   Resp:    SpO2:        Intake and Output Summary (Last 24 hours) at Date Time  No intake or output data in the 24 hours ending 07/23/12 2158    Physical Exam   Constitutional: She is oriented to person, place, and time. She appears well-developed.   HENT: No oral lesions seen  Head: Normocephalic and atraumatic.   Eyes: Pupils are equal, round, and reactive to light.   Neck: No  JVD present. No tracheal deviation present. No thyromegaly present.   Cardiovascular: Normal rate and normal heart sounds.    Pulmonary/Chest: No respiratory distress. She has no wheezes. She has no rales.   Abdominal: She exhibits no distension. There is no tenderness. There is no rebound and no guarding.   Musculoskeletal: She exhibits no edema.   Neurological: She is alert and oriented to person, place, and time.           Labs:     Results     Procedure Component Value Units Date/Time    Packed Red Blood Cells - Product [[id number]] Collected:07/23/12 1710     RBC Leukoreduced Irradiated [id number]    selected Updated:07/23/12 2051    Blood culture [[id number]] Collected:07/22/12 1555    Specimen Information:Blood / Blood, Venipuncture Updated:07/23/12 2015    Narrative:    ORDER#: [id number]                                    ORDERED BY: AKSENTIJEVICH,  SOURCE: Blood, Venipuncture in the arm               COLLECTED:  07/22/12 15:55  ANTIBIOTICS AT COLL.:                                RECEIVED :  07/22/12 19:43  Culture Blood                              PRELIM      07/23/12 20:15  07/23/12   No Growth after 1 day/s of incubation.      Blood culture  [[id number]] Collected:07/22/12 1555    Specimen Information:Blood / Blood, Intravenous Line Updated:07/23/12 2015    Narrative:    ORDER#: [id number]                                    ORDERED BY: AKSENTIJEVICH,  SOURCE: Blood, Intravenous Line right chest          COLLECTED:  07/22/12 15:55  ANTIBIOTICS AT COLL.:                                RECEIVED :  07/22/12 19:43  Culture Blood                              PRELIM      07/23/12 20:15  07/23/12   No Growth after 1 day/s of incubation.      Type and Screen [[id number]] Collected:07/23/12 1710    Specimen Information:Blood Updated:07/23/12 1925     ABO Rh O POS      AB Screen Gel NEG     Vancomycin, trough [[id number]] Collected:07/23/12 1140    Specimen Information:Blood Updated:07/23/12 1229     Vancomycin Trough 13.4 ug/mL      Vancomycin Time of Last Dose unk      Vancomycin Date of Last Dose 07/23/2012     Manual Differential [[id number]]  (Abnormal) Collected:07/23/12 0601     Segmented Neutrophils 97 % Updated:07/23/12 1005     Band Neutrophils 0 %      Lymphocytes Manual  3 %      Monocytes Manual 0 %      Eosinophils Manual 0 %      Basophils Manual 0 %      Nucleated RBC 0      Abs Seg Manual 0.30 (L)      Bands Absolute 0.00      Absolute Lymph Manual 0.01 (L)      Monocytes Absolute 0.00      Absolute Eos Manual 0.00      Absolute Baso Manual 0.00     Cell MorpHology [[id number]] Collected:07/23/12 0601     Cell Morphology: Normal Updated:07/23/12 1005    CBC and differential [[id number]]  (Abnormal) Collected:07/23/12 0601    Specimen Information:Blood / Blood Updated:07/23/12 1005     WBC 0.31 (L)      RBC 2.25 (L)      Hgb 7.3 (L) g/dL      Hematocrit 53.6 (L) %      MCV 92.0 fL      MCH 32.4 (H) pg      MCHC 35.3 g/dL      RDW 12 %      Platelets 19 (LL)      MPV 9.9 fL     HEMOLYZED INDEX [[id number]] Collected:07/23/12 0601     Hemolyzed Index 3 Updated:07/23/12 0708    GFR [[id number]] Collected:07/23/12 0601     EGFR >60.0 Updated:07/23/12 0708     Comprehensive metabolic panel [[id number]]  (Abnormal) Collected:07/23/12 0601    Specimen Information:Blood Updated:07/23/12 0708     Glucose 103 (H) mg/dL      BUN 7.0 mg/dL      Creatinine 0.5 (L) mg/dL      Sodium 416      Potassium 3.7      Chloride 109 (H)      CO2 27      CALCIUM 8.7 mg/dL      Protein, Total 5.1 (L) g/dL      Albumin 2.4 (L) g/dL      AST (SGOT) 22 U/L      ALT 51 U/L      Alkaline Phosphatase 54 U/L      Bilirubin, Total 0.4 mg/dL      Globulin 2.7 g/dL      Albumin/Globulin Ratio 0.9      Anion Gap 3.0 (L)     Culture Blood, Aerobic [[id number]] Collected:07/21/12 2247    Specimen Information:Blood / Blood, Intravenous Line Updated:07/23/12 0221    Narrative:    ORDER#: [id number]                                    ORDERED BY: DUNNING, DAVID  SOURCE: Blood, Intravenous Line right chest mediport COLLECTED:  07/21/12 22:47  ANTIBIOTICS AT COLL.:                                RECEIVED :  07/22/12 01:39  Culture Blood                              PRELIM      07/23/12 02:21  07/23/12   No Growth after 1 day/s of incubation.      Culture Blood, Aerobic [[id number]] Collected:07/21/12 2247    Specimen Information:Blood / Blood, Venipuncture Updated:07/23/12 0221  Narrative:    ORDER#: [id number]                                    ORDERED BY: DUNNING, DAVID  SOURCE: Blood, Venipuncture right hand               COLLECTED:  07/21/12 22:47  ANTIBIOTICS AT COLL.:                                RECEIVED :  07/22/12 01:39  Culture Blood                              PRELIM      07/23/12 02:21  07/23/12   No Growth after 1 day/s of incubation.      Urine culture [[id number]] Collected:07/21/12 2354    Specimen Information:Urine / Urine, Clean Catch Updated:07/22/12 2355    Narrative:    ORDER#: [id number]                                    ORDERED BY: DUNNING, DAVID  SOURCE: Urine, Clean Catch                           COLLECTED:  07/21/12 23:54  ANTIBIOTICS AT COLL.:                                RECEIVED :   07/22/12 01:39  Culture Urine                              FINAL       07/22/12 23:55  07/22/12   No growth of >1,000 CFU/ML, No further work      Blood culture [[id number]] Collected:07/18/12 1815    Specimen Information:Blood / Blood Updated:07/22/12 2321    Narrative:    ORDER#: [id number]                                    ORDERED BY: CHOUDHARY, IMTI  SOURCE: Blood PERIPHERAL                             COLLECTED:  07/18/12 18:15  ANTIBIOTICS AT COLL.:                                RECEIVED :  07/18/12 22:51  Culture Blood                              PRELIM      07/22/12 23:21  07/19/12   No Growth after 1 day/s of incubation.  07/20/12   No Growth after 2 day/s of incubation.  07/21/12   No Growth after 3 day/s of incubation.  07/22/12   No Growth after 4 day/s of incubation.      Blood culture [[id number]] Collected:07/18/12 1832    Specimen Information:Blood /  Blood Updated:07/22/12 2321    Narrative:    ORDER#: [id number]                                    ORDERED BY: Janalyn Rouse, IMTI  SOURCE: Blood R CHEST CENTRAL LINE                   COLLECTED:  07/18/12 18:32  ANTIBIOTICS AT COLL.:                                RECEIVED :  07/18/12 22:51  Culture Blood                              PRELIM      07/22/12 23:21  07/19/12   No Growth after 1 day/s of incubation.  07/20/12   No Growth after 2 day/s of incubation.  07/21/12   No Growth after 3 day/s of incubation.  07/22/12   No Growth after 4 day/s of incubation.              Rads:     Xr Chest 2 Views    06/26/2012   CLINICAL INDICATION: pneumonia  COMPARISON: None available  FINDINGS:   2 views of the chest were obtained. The lungs are clear. The heart and vascularity are within normal limits.  There is no pleural thickening or effusion. The osseous structures are unremarkable.      06/26/2012   No active cardiopulmonary disease   Heron Nay, MD  06/26/2012 11:12 PM     Chest 2 Views    06/25/2012  CLINICAL INDICATION: Fever  COMPARISON: None.   INTERPRETATION: Frontal and lateral views of the chest obtained. Cardiomediastinal contour within normal limits for age. Clear lungs and sharp sulci.           06/25/2012   No acute cardiopulmonary process.  Filbert Schilder, MD  06/25/2012 10:43 PM         Signed by: Eric Form, MD  Pager: [phone]

## 2012-07-23 NOTE — Progress Notes (Signed)
HEMATOLOGY ONCOLOGY PROGRESS NOTE    Date Time: 07/23/2012 3:29 PM  Patient Name: [name], [name]      IMPRESSION/PLAN:   1- Acute Leukemia M0.  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  Discussed this with Ms. Evola.  Will not re-induce at this time.  Will allow patient's count and overall condition to recover.   This will also allow to clarify the picture.  Will continue G-CSF.  Continue support.   2- Neutropenic fever. Patient does not look toxic.  Antibiotics chnaged as per Dr. Janalyn Rouse today.  3- Pancytopenia.  Transfuse as needed as per Hospital guidelines.   4- Skin rash.  Possibly drug allergy.  Discussed with Ch[place].  Ok with steroids.    5- Small bilateral pleural effusion. Follow.  6- Erythema at [place] catheter entry site. Follow.  OK with hydrocortisone topical.  7- Chondromalacia Patellae.    HISTORY:    Ms. [name] is a 57 year old who present with fevers, arthralgias, myalgias, and some rhinorrhea.   A CBC at the time of admission revealed a WBC of 0.84, hemoglobin 11.4, hematocrit 34.2 and a platelet count of 107.  Her absolute neutrophil count was 0.39.  A bone marrow showed AML.     INTERVAL HISTORY:    She looks very tired.  She is dizzy when out of bed.   No diarrhea. No SOB, back or chest pain.     I received a call from Dr. Verdie Mosher earlier today, telling me that immunostaining of the bone marrow biopsy showed a large fraction of the residual cells to be CD34 +, suggesting residual AML.  She confirmed that the aspirate flow was negative and that he marrow looked empty (<less than 5% cellularity).    PHYSICAL EXAM:     Filed Vitals:    07/23/12 1514   BP:    Pulse:    Temp: 101.8 F (38.8 C)   Resp:    SpO2:      Appearance: In no acute distress.  Very fatigue-looking.  HEENT: Pharynx clear  Lungs: Clear to ausculation  Heart: Regular rate and rhythm, normal heart sounds.  [place] catheter in place.   Abdomen: Flat and soft, active bowel  sounds  Extremities: No edema  Neuro: Alert, appropriate    MEDS:   Scheduled Meds:  Current Facility-Administered Medications   Medication Dose Route Frequency   . acyclovir  400 mg Oral Q12H SCH   . docusate sodium  100 mg Oral Daily   . fluconazole  100 mg Intravenous Q24H SCH   . hydrocortisone   Topical BID   . lactobacillus/streoptococcus  1 capsule Oral Daily   . levofloxacin  500 mg Intravenous Q24H   . methylprednisolone  80 mg Intravenous Q8H SCH   . pantoprazole  40 mg Oral QAM AC   . polyethylene glycol  17 g Oral Daily   . tbo-filgrastim  300 mcg Subcutaneous Daily   . tigecycline  100 mg Intravenous Once   . tigecycline  50 mg Intravenous Q12H SCH   . [DISCONTINUED] ceftAZIDime  1 g Intravenous Q8H   . [DISCONTINUED] metroNIDAZOLE  500 mg Intravenous Q8H   . [DISCONTINUED] vancomycin  1,250 mg Intravenous Q8H   . [DISCONTINUED] vancomycin  1,500 mg Intravenous Q8H   . [DISCONTINUED] vancomycin  1,500 mg Intravenous Q8H     Continuous Infusions:       . sodium chloride 50 mL/hr at 07/23/12 1320   . sodium chloride  PRN Meds:.sodium chloride, acetaminophen, acetaminophen, ALPRAZolam, alteplase, alum & mag hydroxide-simethicone, bisacodyl, diphenhydrAMINE, diphenhydrAMINE-zinc acetate, EPINEPHrine, EPINEPHrine, hydrocortisone, lactulose, lidocaine, loperamide, LORazepam, LORazepam, Magic Mouthwash, morphine, naloxone, simethicone, sodium phosphate    LABS:     Results     Procedure Component Value Units Date/Time    Vancomycin, trough [[id number]] Collected:07/23/12 1140    Specimen Information:Blood Updated:07/23/12 1229     Vancomycin Trough 13.4 ug/mL      Vancomycin Time of Last Dose unk      Vancomycin Date of Last Dose 07/23/2012     Manual Differential [[id number]]  (Abnormal) Collected:07/23/12 0601     Segmented Neutrophils 97 % Updated:07/23/12 1005     Band Neutrophils 0 %      Lymphocytes Manual 3 %      Monocytes Manual 0 %      Eosinophils Manual 0 %      Basophils Manual 0 %       Nucleated RBC 0      Abs Seg Manual 0.30 (L)      Bands Absolute 0.00      Absolute Lymph Manual 0.01 (L)      Monocytes Absolute 0.00      Absolute Eos Manual 0.00      Absolute Baso Manual 0.00     Cell MorpHology [[id number]] Collected:07/23/12 0601     Cell Morphology: Normal Updated:07/23/12 1005    CBC and differential [[id number]]  (Abnormal) Collected:07/23/12 0601    Specimen Information:Blood / Blood Updated:07/23/12 1005     WBC 0.31 (L)      RBC 2.25 (L)      Hgb 7.3 (L) g/dL      Hematocrit 40.3 (L) %      MCV 92.0 fL      MCH 32.4 (H) pg      MCHC 35.3 g/dL      RDW 12 %      Platelets 19 (LL)      MPV 9.9 fL     HEMOLYZED INDEX [[id number]] Collected:07/23/12 0601     Hemolyzed Index 3 Updated:07/23/12 0708    GFR [[id number]] Collected:07/23/12 0601     EGFR >60.0 Updated:07/23/12 0708    Comprehensive metabolic panel [[id number]]  (Abnormal) Collected:07/23/12 0601    Specimen Information:Blood Updated:07/23/12 0708     Glucose 103 (H) mg/dL      BUN 7.0 mg/dL      Creatinine 0.5 (L) mg/dL      Sodium 630      Potassium 3.7      Chloride 109 (H)      CO2 27      CALCIUM 8.7 mg/dL      Protein, Total 5.1 (L) g/dL      Albumin 2.4 (L) g/dL      AST (SGOT) 22 U/L      ALT 51 U/L      Alkaline Phosphatase 54 U/L      Bilirubin, Total 0.4 mg/dL      Globulin 2.7 g/dL      Albumin/Globulin Ratio 0.9      Anion Gap 3.0 (L)     Culture Blood, Aerobic [[id number]] Collected:07/21/12 2247    Specimen Information:Blood / Blood, Intravenous Line Updated:07/23/12 0221    Narrative:    ORDER#: [id number]                                    ORDERED BY:  DUNNING, DAVID  SOURCE: Blood, Intravenous Line right chest mediport COLLECTED:  07/21/12 22:47  ANTIBIOTICS AT COLL.:                                RECEIVED :  07/22/12 01:39  Culture Blood                              PRELIM      07/23/12 02:21  07/23/12   No Growth after 1 day/s of incubation.      Culture Blood, Aerobic [[id number]] Collected:07/21/12 2247    Specimen  Information:Blood / Blood, Venipuncture Updated:07/23/12 0221    Narrative:    ORDER#: [id number]                                    ORDERED BY: DUNNING, DAVID  SOURCE: Blood, Venipuncture right hand               COLLECTED:  07/21/12 22:47  ANTIBIOTICS AT COLL.:                                RECEIVED :  07/22/12 01:39  Culture Blood                              PRELIM      07/23/12 02:21  07/23/12   No Growth after 1 day/s of incubation.      Urine culture [[id number]] Collected:07/21/12 2354    Specimen Information:Urine / Urine, Clean Catch Updated:07/22/12 2355    Narrative:    ORDER#: [id number]                                    ORDERED BY: DUNNING, DAVID  SOURCE: Urine, Clean Catch                           COLLECTED:  07/21/12 23:54  ANTIBIOTICS AT COLL.:                                RECEIVED :  07/22/12 01:39  Culture Urine                              FINAL       07/22/12 23:55  07/22/12   No growth of >1,000 CFU/ML, No further work      Blood culture [[id number]] Collected:07/18/12 1815    Specimen Information:Blood / Blood Updated:07/22/12 2321    Narrative:    ORDER#: [id number]                                    ORDERED BY: CHOUDHARY, IMTI  SOURCE: Blood PERIPHERAL                             COLLECTED:  07/18/12 18:15  ANTIBIOTICS AT COLL.:  RECEIVED :  07/18/12 22:51  Culture Blood                              PRELIM      07/22/12 23:21  07/19/12   No Growth after 1 day/s of incubation.  07/20/12   No Growth after 2 day/s of incubation.  07/21/12   No Growth after 3 day/s of incubation.  07/22/12   No Growth after 4 day/s of incubation.      Blood culture [[id number]] Collected:07/18/12 1832    Specimen Information:Blood / Blood Updated:07/22/12 2321    Narrative:    ORDER#: [id number]                                    ORDERED BY: Janalyn Rouse, IMTI  SOURCE: Blood R CHEST CENTRAL LINE                   COLLECTED:  07/18/12 18:32  ANTIBIOTICS AT COLL.:                                 RECEIVED :  07/18/12 22:51  Culture Blood                              PRELIM      07/22/12 23:21  07/19/12   No Growth after 1 day/s of incubation.  07/20/12   No Growth after 2 day/s of incubation.  07/21/12   No Growth after 3 day/s of incubation.  07/22/12   No Growth after 4 day/s of incubation.      Vancomycin, trough [[id number]]  (Abnormal) Collected:07/22/12 1958    Specimen Information:Blood Updated:07/22/12 2049     Vancomycin Trough 9.3 (L) ug/mL      Vancomycin Time of Last Dose unk      Vancomycin Date of Last Dose 07/22/2012     Narrative:    Draw Trough level 30 minutes prior to 20:30 dose at 7/9.    Blood culture [[id number]] Collected:07/22/12 1555    Specimen Information:Blood / Blood, Venipuncture Updated:07/22/12 1943    Narrative:    8ml required    Blood culture [[id number]] Collected:07/22/12 1555    Specimen Information:Blood / Blood, Intravenous Line Updated:07/22/12 1943    Narrative:    8ml required          IMAGING DATA:  Xr Chest 2 Views    06/26/2012   CLINICAL INDICATION: pneumonia  COMPARISON: None available  FINDINGS:   2 views of the chest were obtained. The lungs are clear. The heart and vascularity are within normal limits.  There is no pleural thickening or effusion. The osseous structures are unremarkable.      06/26/2012   No active cardiopulmonary disease   Heron Nay, MD  06/26/2012 11:12 PM     Chest 2 Views    06/25/2012  CLINICAL INDICATION: Fever  COMPARISON: None.  INTERPRETATION: Frontal and lateral views of the chest obtained. Cardiomediastinal contour within normal limits for age. Clear lungs and sharp sulci.           06/25/2012   No acute cardiopulmonary process.  Filbert Schilder, MD  06/25/2012 10:43 PM      Sharmon Revere, MD  [facility]

## 2012-07-23 NOTE — Plan of Care (Signed)
Problem: Infection/Potential for Infection  Goal: Free from infection  Outcome: Progressing  Tylenol given as needed for elevated temperatures. IV antibiotics given. Type and screen done. Patient to get one unit of PRBC tonight. Report given to oncoming nurse.

## 2012-07-23 NOTE — Plan of Care (Addendum)
Problem: Hemodynamic Status: Immune Suppressed-Hematologic  Goal: Stable vital signs and fluid balance  Outcome: Progressing  V/s monitored and recorded.neutropenic prec. Measures maintained.on. Iv.antibiotics given as orderred. Will cont. To monitor pt. Plan of care and treatments closely.      Addendum;pt. Has still rashes on her chest and back.no c/o itching noted

## 2012-07-23 NOTE — Progress Notes (Signed)
Infectious Diseases & Tropical Medicine  Progress Note    07/23/2012   [name] VWU:[id number] is a 57 y.o. female,       Assessment:     Acute leukemia s/p chemotherapy  Groshong catheter in place  Febrile pancytopenia / neutropenia  Elevated LFT's - resolved  CXR - NAD  Persistent fevers  Generalized rash  S/P bone marrow biopsy-still showing leukemia  Blood cultures - NGTD 07/21/12  Urine cultures no growth  Depression     Plan:      Discontinue vancomycin,ceftazidime,flagyl   Start tygacil and levaquin    Start solumedrol   D/W Dr Ambrose [place]   Continue Diflucan and acyclovir   Continue probiotics   On G- CSF   Isolation precautions including gowns   Reviewed notes   Monitor clinically   Discussed with patient at length   D/W pharmacy   D/W Dr Mylo Red       ROS:     General:   Persistent fever, no chills, no rigor,c/o being tired,light headed  HEENT: no neck pain, no throat pain  Endocrine:c/o fatigue  Respiratory: no cough,no shortness of breath, or wheezing   Cardiovascular: no chest pain   Gastrointestinal: no abdominal pain,no N/V/D  Genito-Urinary: no dysuria, trouble voiding, or hematuria  Musculoskeletal: no edema, myalgias, leg swellling  Neurological: c/o generalized weakness  Dermatological:  Generalized maculopapular rash, no ulcer    Physical Examination:     Blood pressure 106/55, pulse 90, temperature 101.5 F (38.6 C), temperature source Oral, resp. rate 20, height 1.651 m (5\' 5" ), weight 51.823 kg (114 lb 4 oz), SpO2 97.00%.     General Appearance: Comfortable, and in no acute distress. Awake and alert,weak and tired   HEENT: Pupils are equal, round, and reactive to light.    Lungs:  Decreased breath sounds   Heart: RRR   Chest: Symmetric chest wall expansion.   Abdomen: soft ,non tender,no hepatosplenomegaly   Neurological: No focal deficit   Extremities: No edema    Laboratory And Diagnostic Studies:     Recent Labs   [facility] 07/23/12 0601 07/22/12  0800    WBC 0.31* 0.24*    HGB 7.3* 7.3*    HCT 20.7* 21.3*    PLT 19* 20*     Recent Labs   Basename 07/23/12 0601 07/22/12 0800    NA 139 139    K 3.7 3.7    CL 109* 108*    CO2 27 25    BUN 7.0 8.0    CREAT 0.5* 0.5*    GLU 103* 102*    CA 8.7 8.3*     Recent Labs   Basename 07/23/12 0601 07/22/12 0800    AST 22 11    ALT 51 46    ALKPHOS 54 50    PROT 5.1* 4.9*    ALB 2.4* 2.4*       Current Meds:      Scheduled Meds: PRN Meds:           acyclovir 400 mg Oral Q12H SCH   ceftAZIDime 1 g Intravenous Q8H   docusate sodium 100 mg Oral Daily   fluconazole 100 mg Intravenous Q24H SCH   hydrocortisone  Topical BID   lactobacillus/streoptococcus 1 capsule Oral Daily   metroNIDAZOLE 500 mg Intravenous Q8H   pantoprazole 40 mg Oral QAM AC   polyethylene glycol 17 g Oral Daily   tbo-filgrastim 300 mcg Subcutaneous Daily   vancomycin 1,500 mg Intravenous Q8H   [  DISCONTINUED] meropenem 500 mg Intravenous [facility]   [DISCONTINUED] vancomycin 1,250 mg Intravenous Q8H   [DISCONTINUED] vancomycin 1,500 mg Intravenous Q8H       Continuous Infusions:       . sodium chloride 50 mL/hr at 07/22/12 0110   . sodium chloride           sodium chloride 200 mL/hr Continuous PRN   acetaminophen 650 mg Q4H PRN   acetaminophen 650 mg Q4H PRN   ALPRAZolam 0.25 mg QHS PRN   alteplase 2 mg Once PRN   alum & mag hydroxide-simethicone 30 mL Q4H PRN   bisacodyl 5 mg QD PRN   diphenhydrAMINE 50 mg PRN   diphenhydrAMINE-zinc acetate  TID PRN   EPINEPHrine 0.3 mg PRN   EPINEPHrine 0.1 mg PRN   hydrocortisone 100 mg PRN   lactulose 20 g BID PRN   lidocaine  PRN   loperamide 2 mg PRN   LORazepam 1 mg Q4H PRN   Or     LORazepam 1 mg Q4H PRN   Magic Mouthwash 30 mL Q4H PRN   morphine 2 mg Q2H PRN   naloxone 0.2 mg PRN   simethicone 80 mg Q6H PRN   sodium phosphate 1 enema QD PRN         [name] A. Janalyn Rouse, M.D.  07/23/2012  10:20 AM

## 2012-07-24 LAB — MAN DIFF ONLY
Band Neutrophils Absolute: 0 (ref 0.00–1.00)
Band Neutrophils Absolute: 0 (ref 0.00–1.00)
Band Neutrophils: 0 %
Band Neutrophils: 0 %
Basophils Absolute Manual: 0 (ref 0.00–0.20)
Basophils Absolute Manual: 0 (ref 0.00–0.20)
Basophils Manual: 0 %
Basophils Manual: 0 %
Eosinophils Absolute Manual: 0 (ref 0.00–0.70)
Eosinophils Absolute Manual: 0 (ref 0.00–0.70)
Eosinophils Manual: 0 %
Eosinophils Manual: 0 %
Lymphocytes Absolute Manual: 0.01 — ABNORMAL LOW (ref 0.50–4.40)
Lymphocytes Absolute Manual: 0.38 — ABNORMAL LOW (ref 0.50–4.40)
Lymphocytes Manual: 93 %
Lymphocytes Manual: 72 %
Monocytes Absolute: 0 (ref 0.00–1.20)
Monocytes Absolute: 0.08 (ref 0.00–1.20)
Monocytes Manual: 5 %
Monocytes Manual: 15 %
Neutrophils Absolute Manual: 0.3 — ABNORMAL LOW (ref 1.80–8.10)
Neutrophils Absolute Manual: 0.07 — ABNORMAL LOW (ref 1.80–8.10)
Nucleated RBC: 0 (ref 0–1)
Nucleated RBC: 0 (ref 0–1)
Segmented Neutrophils: 2 %
Segmented Neutrophils: 14 %

## 2012-07-24 LAB — COMPREHENSIVE METABOLIC PANEL
ALT: 54 U/L (ref 0–55)
AST (SGOT): 21 U/L (ref 5–34)
Albumin/Globulin Ratio: 0.8 — ABNORMAL LOW (ref 0.9–2.2)
Albumin: 2.7 g/dL — ABNORMAL LOW (ref 3.5–5.0)
Alkaline Phosphatase: 60 U/L (ref 40–150)
Anion Gap: 7 (ref 5.0–15.0)
BUN: 14 mg/dL (ref 7.0–19.0)
Bilirubin, Total: 0.7 mg/dL (ref 0.2–1.2)
CO2: 26 (ref 22–29)
Calcium: 9.4 mg/dL (ref 8.5–10.5)
Chloride: 107 (ref 98–107)
Creatinine: 0.6 mg/dL (ref 0.6–1.0)
Globulin: 3.2 g/dL (ref 2.0–3.6)
Glucose: 135 mg/dL — ABNORMAL HIGH (ref 70–100)
Potassium: 4.2 (ref 3.5–5.1)
Protein, Total: 5.9 g/dL — ABNORMAL LOW (ref 6.0–8.3)
Sodium: 140 (ref 136–145)

## 2012-07-24 LAB — CBC AND DIFFERENTIAL
Hematocrit: 27.1 % — ABNORMAL LOW (ref 37.0–47.0)
Hgb: 9.4 g/dL — ABNORMAL LOW (ref 12.0–16.0)
MCH: 31.5 pg (ref 28.0–32.0)
MCHC: 34.7 g/dL (ref 32.0–36.0)
MCV: 90.9 fL (ref 80.0–100.0)
MPV: 12.1 fL (ref 9.4–12.3)
Platelets: 42 — ABNORMAL LOW (ref 140–400)
RBC: 2.98 — ABNORMAL LOW (ref 4.20–5.40)
RDW: 14 % (ref 12–15)
WBC: 0.53 — ABNORMAL LOW (ref 3.50–10.80)

## 2012-07-24 LAB — HEMOLYSIS INDEX: Hemolysis Index: 62 — ABNORMAL HIGH (ref 0–18)

## 2012-07-24 LAB — GFR: EGFR: >60

## 2012-07-24 LAB — CELL MORPHOLOGY: Cell Morphology: NORMAL

## 2012-07-24 MED ORDER — METHYLPREDNISOLONE SODIUM SUCC 125 MG IJ SOLR
80.0000 mg | Freq: Two times a day (BID) | INTRAMUSCULAR | Status: DC
Start: 2012-07-24 — End: 2012-07-26
  Administered 2012-07-24 – 2012-07-26 (×4): 80 mg via INTRAVENOUS
  Filled 2012-07-24 (×4): qty 2

## 2012-07-24 NOTE — Progress Notes (Signed)
Infectious Diseases & Tropical Medicine  Progress Note    07/24/2012   [name] [id number],OZH:[id number] is a 57 y.o. female,       Assessment:     Acute leukemia s/p chemotherapy  Groshong catheter in place  Febrile pancytopenia / neutropenia  Elevated LFT's - resolved  CXR - NAD  Persistent fevers but improving  Generalized rash  S/P bone marrow biopsy  Blood cultures - NGTD 07/21/12  Urine cultures no growth    Plan:      Continue tygacil and Levaquin   Tolerating well    Continue solumedrol   Continue Diflucan and acyclovir   Continue probiotics   On G- CSF   Isolation precautions including gowns   Reviewed notes   Monitor clinically   Discussed with patient at length   D/W Dr Mylo Red    ROS:     General:   Persistent fever, no chills, no rigor,feeling better today  HEENT: no neck pain, no throat pain  Endocrine:no fatigue  Respiratory: no cough,no shortness of breath, or wheezing   Cardiovascular: no chest pain   Gastrointestinal: no abdominal pain,no N/V/D  Genito-Urinary: no dysuria, trouble voiding, or hematuria  Musculoskeletal: no edema, myalgias, leg swellling  Neurological: c/o generalized weakness  Dermatological:  Generalized maculopapular rash somewhat lighter, no ulcer    Physical Examination:     Blood pressure 107/59, pulse 79, temperature 98.8 F (37.1 C), temperature source Oral, resp. rate 16, height 1.651 m (5\' 5" ), weight 51.823 kg (114 lb 4 oz), SpO2 97.00%.     General Appearance: Comfortable, and in no acute distress. Awake and alert,feeling better   HEENT: Pupils are equal, round, and reactive to light.    Lungs:  CTA   Heart: RRR   Chest: Symmetric chest wall expansion.   Abdomen: soft ,non tender,no hepatosplenomegaly   Neurological: No focal deficit   Extremities: No edema    Laboratory And Diagnostic Studies:     Recent Labs   [facility] 07/24/12 0625 07/23/12 0601    WBC 0.53* 0.31*    HGB 9.4* 7.3*    HCT 27.1* 20.7*    PLT 42* 19*     Recent Labs   [facility]  07/24/12 0625 07/23/12 0601    NA 140 139    K 4.2 3.7    CL 107 109*    CO2 26 27    BUN 14.0 7.0    CREAT 0.6 0.5*    GLU 135* 103*    CA 9.4 8.7     Recent Labs   Basename 07/24/12 0625 07/23/12 0601    AST 21 22    ALT 54 51    ALKPHOS 60 54    PROT 5.9* 5.1*    ALB 2.7* 2.4*       Current Meds:      Scheduled Meds: PRN Meds:           acyclovir 400 mg Oral Q12H SCH   docusate sodium 100 mg Oral Daily   fluconazole 100 mg Intravenous Q24H SCH   hydrocortisone  Topical BID   lactobacillus/streoptococcus 1 capsule Oral Daily   levofloxacin 500 mg Intravenous Q24H   methylprednisolone 80 mg Intravenous Q8H SCH   pantoprazole 40 mg Oral QAM AC   polyethylene glycol 17 g Oral Daily   tbo-filgrastim 300 mcg Subcutaneous Daily   [COMPLETED] tigecycline 100 mg Intravenous Once   tigecycline 50 mg Intravenous Q12H [facility]   [DISCONTINUED] ceftAZIDime 1 g Intravenous Q8H   [  DISCONTINUED] metroNIDAZOLE 500 mg Intravenous Q8H   [DISCONTINUED] vancomycin 1,500 mg Intravenous Q8H       Continuous Infusions:       . sodium chloride 50 mL/hr at 07/23/12 1320   . sodium chloride           sodium chloride 200 mL/hr Continuous PRN   acetaminophen 650 mg Q4H PRN   acetaminophen 650 mg Q4H PRN   ALPRAZolam 0.25 mg QHS PRN   alteplase 2 mg Once PRN   alum & mag hydroxide-simethicone 30 mL Q4H PRN   bisacodyl 5 mg QD PRN   diphenhydrAMINE 50 mg PRN   diphenhydrAMINE-zinc acetate  TID PRN   EPINEPHrine 0.3 mg PRN   EPINEPHrine 0.1 mg PRN   hydrocortisone 100 mg PRN   lactulose 20 g BID PRN   lidocaine  PRN   loperamide 2 mg PRN   LORazepam 1 mg Q4H PRN   Or     LORazepam 1 mg Q4H PRN   Magic Mouthwash 30 mL Q4H PRN   morphine 2 mg Q2H PRN   naloxone 0.2 mg PRN   simethicone 80 mg Q6H PRN   sodium phosphate 1 enema QD PRN         Berry Gallacher A. Janalyn Rouse, M.D.  07/24/2012  9:15 AM

## 2012-07-24 NOTE — Progress Notes (Signed)
HEMATOLOGY ONCOLOGY PROGRESS NOTE    Date Time: 07/24/2012 8:57 AM  Patient Name: [name]      IMPRESSION/PLAN:   1. 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  Will not re-induce at this time.  Will allow patient's counts and overall condition to recover.  This will also allow to clarify the picture.  Will continue G-CSF.  Continue support.   2. Neutropenic fever. Patient does not look toxic.  Antibiotics chnaged as per Dr. Janalyn Rouse.  3. Pancytopenia.  Transfuse as needed as per Hospital guidelines. Received blood yesterday.  Good "bump" to H/H.  4. Skin rash.  Possibly drug allergy.  Slightly better with antibiotic change and steroids.    5. Left foot hematoma.  Foot elevation and observation.  No need for x-ray.    HISTORY:    [name] is a 57 year old who present with fevers, arthralgias, myalgias, and some rhinorrhea.   A CBC at the time of admission revealed a WBC of 0.84, hemoglobin 11.4, hematocrit 34.2 and a platelet count of 107.  Her absolute neutrophil count was 0.39.  A bone marrow showed AML.     INTERVAL HISTORY:    She received a unit of PRBC yesterday. She looks better this morning. Something fell on her left foot and she sustained a hematoma.  She has no pain and has no problems walking.  She is not dizzy when out of bed.  No diarrhea. No SOB, back or chest pain.     PHYSICAL EXAM:     Filed Vitals:    07/24/12 0731   BP: 107/59   Pulse: 79   Temp: 98.8 F (37.1 C)   Resp: 16   SpO2: 97%     GENERAL: Thin.  In no acute distress.   HEENT: Pharynx clear.  PERRL.  Sclera clear.  Conjunctivae pale.   Lungs: Clear to ausculation.  Heart: Regular rate and rhythm, normal heart sounds.  Groshong catheter in place.   Abdomen: Flat and soft, active bowel sounds  Extremities: No edema.  No clubbing.  Left foot hematoma.  Neuro: Alert, appropriate.  Not focal.  Skin: Rash on chest, abdomen and back.     MEDS:   Scheduled  Meds:  Current Facility-Administered Medications   Medication Dose Route Frequency   . acyclovir  400 mg Oral Q12H SCH   . docusate sodium  100 mg Oral Daily   . fluconazole  100 mg Intravenous Q24H SCH   . hydrocortisone   Topical BID   . lactobacillus/streoptococcus  1 capsule Oral Daily   . levofloxacin  500 mg Intravenous Q24H   . methylprednisolone  80 mg Intravenous Q8H SCH   . pantoprazole  40 mg Oral QAM AC   . polyethylene glycol  17 g Oral Daily   . tbo-filgrastim  300 mcg Subcutaneous Daily   . [COMPLETED] tigecycline  100 mg Intravenous Once   . tigecycline  50 mg Intravenous Q12H SCH   . [DISCONTINUED] ceftAZIDime  1 g Intravenous Q8H   . [DISCONTINUED] metroNIDAZOLE  500 mg Intravenous Q8H   . [DISCONTINUED] vancomycin  1,500 mg Intravenous Q8H     Continuous Infusions:       . sodium chloride 50 mL/hr at 07/23/12 1320   . sodium chloride       PRN Meds:.sodium chloride, acetaminophen, acetaminophen, ALPRAZolam, alteplase, alum & mag hydroxide-simethicone, bisacodyl, diphenhydrAMINE, diphenhydrAMINE-zinc acetate, EPINEPHrine, EPINEPHrine, hydrocortisone, lactulose, lidocaine,  loperamide, LORazepam, LORazepam, Magic Mouthwash, morphine, naloxone, simethicone, sodium phosphate    LABS:     Results     Procedure Component Value Units Date/Time    Manual Differential [[id number]]  (Abnormal) Collected:07/23/12 0601     Segmented Neutrophils 2 % Updated:07/24/12 0824     Band Neutrophils 0 %      Lymphocytes Manual 93 %      Monocytes Manual 5 %      Eosinophils Manual 0 %      Basophils Manual 0 %      Nucleated RBC 0      Abs Seg Manual 0.30 (L)      Bands Absolute 0.00      Absolute Lymph Manual 0.01 (L)      Monocytes Absolute 0.00      Absolute Eos Manual 0.00      Absolute Baso Manual 0.00     CBC and differential [[id number]]  (Abnormal) Collected:07/24/12 0625    Specimen Information:Blood / Blood Updated:07/24/12 0817     WBC 0.53 (L)      RBC 2.98 (L)      Hgb 9.4 (L) g/dL      Hematocrit 47.8 (L) %       MCV 90.9 fL      MCH 31.5 pg      MCHC 34.7 g/dL      RDW 14 %      Platelets 42 (L)      MPV 12.1 fL     Manual Differential [[id number]]  (Abnormal) Collected:07/24/12 0625     Segmented Neutrophils 14 % Updated:07/24/12 0817     Band Neutrophils 0 %      Lymphocytes Manual 72 %      Monocytes Manual 15 %      Eosinophils Manual 0 %      Basophils Manual 0 %      Nucleated RBC 0      Abs Seg Manual 0.07 (L)      Bands Absolute 0.00      Absolute Lymph Manual 0.38 (L)      Monocytes Absolute 0.08      Absolute Eos Manual 0.00      Absolute Baso Manual 0.00     Cell MorpHology [[id number]] Collected:07/24/12 0625     Cell Morphology: Normal Updated:07/24/12 0817    Comprehensive metabolic panel [[id number]] Collected:07/24/12 0625    Specimen Information:Blood Updated:07/24/12 0632    Culture Blood, Aerobic [[id number]] Collected:07/21/12 2247    Specimen Information:Blood / Blood, Intravenous Line Updated:07/24/12 0221    Narrative:    ORDER#: [id number]                                    ORDERED BY: DUNNING, DAVID  SOURCE: Blood, Intravenous Line right chest mediport COLLECTED:  07/21/12 22:47  ANTIBIOTICS AT COLL.:                                RECEIVED :  07/22/12 01:39  Culture Blood                              PRELIM      07/24/12 02:21  07/23/12   No Growth after 1 day/s of incubation.  07/24/12   No Growth after 2 day/s of incubation.  Culture Blood, Aerobic [[id number]] Collected:07/21/12 2247    Specimen Information:Blood / Blood, Venipuncture Updated:07/24/12 0221    Narrative:    ORDER#: [id number]                                    ORDERED BY: DUNNING, DAVID  SOURCE: Blood, Venipuncture right hand               COLLECTED:  07/21/12 22:47  ANTIBIOTICS AT COLL.:                                RECEIVED :  07/22/12 01:39  Culture Blood                              PRELIM      07/24/12 02:21  07/23/12   No Growth after 1 day/s of incubation.  07/24/12   No Growth after 2 day/s of incubation.      Blood  culture [[id number]] Collected:07/18/12 1832    Specimen Information:Blood / Blood Updated:07/24/12 0121    Narrative:    ORDER#: [id number]                                    ORDERED BY: Janalyn Rouse, IMTI  SOURCE: Blood R CHEST CENTRAL LINE                   COLLECTED:  07/18/12 18:32  ANTIBIOTICS AT COLL.:                                RECEIVED :  07/18/12 22:51  Culture Blood                              FINAL       07/24/12 01:21  07/24/12   No growth after 5 days of incubation.      Blood culture [[id number]] Collected:07/18/12 1815    Specimen Information:Blood / Blood Updated:07/24/12 0121    Narrative:    ORDER#: [id number]                                    ORDERED BY: CHOUDHARY, IMTI  SOURCE: Blood PERIPHERAL                             COLLECTED:  07/18/12 18:15  ANTIBIOTICS AT COLL.:                                RECEIVED :  07/18/12 22:51  Culture Blood                              FINAL       07/24/12 01:21  07/24/12   No growth after 5 days of incubation.      Packed Red Blood Cells - Product [[id number]] Collected:07/23/12 1710     RBC Leukoreduced Irradiated [id number]    transfused  Updated:07/24/12 0052    Blood culture [[id number]] Collected:07/22/12 1555    Specimen Information:Blood / Blood, Venipuncture Updated:07/23/12 2015    Narrative:    ORDER#: [id number]                                    ORDERED BY: Onita Pfluger,  SOURCE: Blood, Venipuncture in the arm               COLLECTED:  07/22/12 15:55  ANTIBIOTICS AT COLL.:                                RECEIVED :  07/22/12 19:43  Culture Blood                              PRELIM      07/23/12 20:15  07/23/12   No Growth after 1 day/s of incubation.      Blood culture [[id number]] Collected:07/22/12 1555    Specimen Information:Blood / Blood, Intravenous Line Updated:07/23/12 2015    Narrative:    ORDER#: [id number]                                    ORDERED BY: Hatley Henegar,  SOURCE: Blood, Intravenous Line right chest          COLLECTED:  07/22/12  15:55  ANTIBIOTICS AT COLL.:                                RECEIVED :  07/22/12 19:43  Culture Blood                              PRELIM      07/23/12 20:15  07/23/12   No Growth after 1 day/s of incubation.      Type and Screen [[id number]] Collected:07/23/12 1710    Specimen Information:Blood Updated:07/23/12 1925     ABO Rh O POS      AB Screen Gel NEG     Vancomycin, trough [[id number]] Collected:07/23/12 1140    Specimen Information:Blood Updated:07/23/12 1229     Vancomycin Trough 13.4 ug/mL      Vancomycin Time of Last Dose unk      Vancomycin Date of Last Dose 07/23/2012     Cell MorpHology [[id number]] Collected:07/23/12 0601     Cell Morphology: Normal Updated:07/23/12 1005    CBC and differential [[id number]]  (Abnormal) Collected:07/23/12 0601    Specimen Information:Blood / Blood Updated:07/23/12 1005     WBC 0.31 (L)      RBC 2.25 (L)      Hgb 7.3 (L) g/dL      Hematocrit 16.0 (L) %      MCV 92.0 fL      MCH 32.4 (H) pg      MCHC 35.3 g/dL      RDW 12 %      Platelets 19 (LL)      MPV 9.9 fL           IMAGING DATA:  Xr Chest 2 Views    06/26/2012   CLINICAL INDICATION: pneumonia  COMPARISON: None available  FINDINGS:   2 views  of the chest were obtained. The lungs are clear. The heart and vascularity are within normal limits.  There is no pleural thickening or effusion. The osseous structures are unremarkable.      06/26/2012   No active cardiopulmonary disease   Heron Nay, MD  06/26/2012 11:12 PM     Chest 2 Views    06/25/2012  CLINICAL INDICATION: Fever  COMPARISON: None.  INTERPRETATION: Frontal and lateral views of the chest obtained. Cardiomediastinal contour within normal limits for age. Clear lungs and sharp sulci.           06/25/2012   No acute cardiopulmonary process.  Filbert Schilder, MD  06/25/2012 10:43 PM      Sharmon Revere, MD  [facility]

## 2012-07-24 NOTE — OT Eval Note (Signed)
[facility]   Occupational Therapy Evaluation and Treatment    Patient: [name]     MRN#: [id number]   Unit: [facility] MEDICAL ONCOLOGY RENAL  Bed: A2118/A2118-A    Time of Evaluation:  Time Calculation  OT Received On: 07/24/2012  Start Time: 1409  Stop Time: 1420  Time Calculation (min): 11    Time of Treatment:  Time Calculation  OT Received On: 07/24/2012  Start Time: 1421  Stop Time: 1437  Time Calculation (min): 16        Consult received for [name] Section for OT Evaluation and Treatment.  Patient's medical condition is appropriate for Occupational therapy intervention at this time.    Activity Orders: None specified.  Precautions and Contraindications: Neutropenic precautions  Precautions  Other Precautions: Neutropenic precautions    Medical Diagnosis: Fever [780.60]  Neutropenia [288.00]  ................Marland Kitchen  Acute Leukemia.  Wil need high intensity chenotherapy    History of Present Illness: [name] is a 57 y.o. female admitted on 06/25/2012 with fever of 102 degrees Farenheit and was found to be neutropenic with WBC of 0.84 with about 50% neutrophils. Bone marrow biopsy performed on 6/16 and pt diagnosed with acute leukemia. Patient being treated with G-CSF    Patient Active Problem List   Diagnosis   . Chondromalacia of patella   . Neutropenic fever        Past Medical/Surgical History:  Past Medical History   Diagnosis Date   . Asthma without status asthmaticus       Past Surgical History   Procedure Date   . Hand surgery          X-Rays/Tests/Labs:  Lab Results   Component Value Date/Time    HGB 9.4* 07/24/2012  6:25 AM    HCT 27.1* 07/24/2012  6:25 AM    K 4.2 07/24/2012  6:25 AM    NA 140 07/24/2012  6:25 AM    INR 1.0 06/29/2012  8:59 AM       XR Chest AP Portable 07/21/2012  Impression: No acute process    CT Sinus Facial Bones WO Contrast 06/27/2012  Impression: Normal    Social History:  Prior Level of Function  Prior level of function: Ambulates / Performs ADL's  independently  Assistive Device: None  Baseline Activity Level: Community ambulation  Driving: independent  Dressing - Upper Body: independent  Dressing - Lower Body: independent  Feeding: independent  Bathing: independent  Grooming: independent  Toileting: independent    Home Living Arrangements  Living Arrangements: Other (Comment) (Pt reports going to live with friend post discharge)  Type of Home: House  Home Layout: One level;Stairs to enter without rails (add number in comment) (4 STE)  Bathroom Accessibility: Accessible    Subjective: Subjective: "I really want fruit and vegetables. I have't had a salad in so long!" Patient is agreeable to participation in the therapy session. Nursing clears patient for therapy.   Patient Goal: To get stronger  Pain Assessment  Pain Assessment: No/denies pain      Objective:      Observation of Patient/Vital Signs:VSS  Patient received in bed with Intravenous (IV) in place.    Cognitive Status and Neuro Exam:  Cognition  Arousal/Alertness: Appropriate responses to stimuli  Attention Span: Appears intact  Orientation Level: Oriented X4  Memory: Appears intact  Following Commands: independent  Safety Awareness: independent  Insights: Fully aware of deficits  Problem Solving: Able to problem solve independently  Neuro Status  Behavior: calm cooperative  Motor Planning: intact  Coordination: intact  Hand Dominance: right handed  Safety Awareness: intact       Musculoskeletal Examination  Gross ROM  Neck/Trunk ROM: within functional limits  Right Upper Extremity ROM: within functional limits  Left Upper Extremity ROM: within functional limits  Gross Strength  Right Upper Extremity Strength: within functional limits  Left Upper Extremity Strength: within functional limits       Sensory/Oculomotor Examination  Sensory  Auditory: intact  Tactile - Light Touch:  (Pt reports no N/T)  Visual Acuity: wears glasses         Activities of Daily Living  Self-care and Home Management  Eating:  independently  Grooming: independently  Bathing: independently  UE Dressing: independently  LE Dressing: independently  Toileting: independently  Functional Transfers: independently  Item Retrieval: chest height;above head;independently;standing    Functional Mobility:  Mobility and Transfers  Scooting to HOB: Independently  Supine to Sit: Independently  Sit to Supine: Independently  Sit to Stand: Independently     Balance  Balance  Static Sitting Balance: good  Dyanamic Sitting Balance: good  Static Standing Balance: good  Dynamic Standing Balance: good    Participation and Activity Tolerance  Participation and Endurance  Participation Effort: excellent  Endurance: Tolerates < 10 min exercise, no significant change in vital signs      Educated the patient to role of occupational therapy, plan of care, goals of therapy and safety with mobility and ADLs, energy conservation techniques, home safety.    Patient left in bed with call bell within reach. RN notified of session outcome    Assessment: [name] is a 57 y.o. female admitted 06/25/2012 and found to be independent with all ADLs and functional mobility, but is limited by weakness and fatigue. Pt would benefit from skilled occupational therapy services for education on energy conservation techniques and HEP to improve strength and activity tolerance.       Treatment:  Pt received EOB and agreeable to therapy treatment following evaluation. Pt educated on AROM HEP including shoulder flexion, elbow flexion, horizontal abduction (chest press). Pt instructed to hold towel with both hands, pull taut, and perform movements listed above for 1 set of 10 repetitions. Goal is to increase to 1 set of 10 repetitions three times per day, and then to 2 sets three times per day. Pt verbalized understanding of progression and demonstrated understanding of HEP, willing and motivated to take part. Pt also educated on energy conservation techniques during ADL completion  throughout the day, including placing a stool in the bathroom for rest breaks and a taller stool/chair in the kitchen for completing countertop tasks. Pt verbalized understanding. Pt inquisitive about how long it will take to regain strength/endurance, and understanding explanation that this is relative, varies by person, and is dependent upon many factors. Pt no longer requires occupational therapy services and has met all goals.     Rehabilitation Potential: Prognosis: Good    Plan: OT Frequency Recommended: one time visit (and D/C)   Treatment Interventions: UE strengthening/ROM;Patient/Family training;Compensatory technique education     D/c OT    Risks/benefits/POC discussed    Goals: Time For Goal Achievement: 1 visit  Musculoskeletal Goals  Pt will perform Home Exercise Program: independently;1 visit;Goal met (UE ROM/STRENGTHENING )  Executive Fucntion Goals  Other Goal: Pt will verbalize understanding of energy conservation techniques to be employed during ADLs (Goal met)  Discharge Recommendation: Home with no needs (Pt going to live with close friend, will have assistance PRN)       Eileen Stanford, OTS  Occupational Therapy Student     Midge Aver OTR/L #[id number]  07/24/2012 3:34 PM

## 2012-07-24 NOTE — Plan of Care (Signed)
Problem: Occupational Therapy  Goal: Patient condition is improving per Occupational Therapy Treatment Plan  Outcome: Completed Date Met:  07/24/12  Please see Occupational Therapy Evaluation.

## 2012-07-24 NOTE — Plan of Care (Signed)
Problem: Safety  Goal: Patient will be free from injury during hospitalization  Outcome: Progressing  HOURLY ROUNDS  PATIENT CALLS FOR HELP TO BATHROOM  NO INJURY  OCCURRED.    Problem: Hemodynamic Status: Immune Suppressed-Hematologic  Goal: Stable vital signs and fluid balance  Outcome: Progressing  Vitals signs stable  Neutropenic  Precautions maintained . 1 unit of prbc infused with no reaction  .urine output adequate.

## 2012-07-24 NOTE — Progress Notes (Signed)
Daily PROGRESS NOTE    Date Time: 07/24/2012 2:39 PM  Patient Name: [name], [name]  Patient Status: Inpatient  Hospital Day: 29    Assessment:   Acute Leukemia, NOS   Neutropenic fever  .Pancytopenia.   .Weight loss with a body mass index of 18 shows mild to moderate   Malnutrition  . Skin rash.  Epigastric pain  Sinus brady  BM with Residual AML, hypocellular marrow    Plan:   1. Await marrow recovery  2. Transfuse as needed, CSGF  3. Neutropenic precaution  4. D/cMerrrem, Vancomycin for possible reaction on sSolumedrol  5. acyclovir, diflucan, Flagyl and Fortaz  6. Reduce solumedrol    Subjective:    Fatigue, fever, skin rashes improved  10 point Review of Systems - Negative except for the Positives mentioned above      Medications:     Current Facility-Administered Medications   Medication Dose Route Frequency   . acyclovir  400 mg Oral Q12H SCH   . docusate sodium  100 mg Oral Daily   . fluconazole  100 mg Intravenous Q24H SCH   . hydrocortisone   Topical BID   . lactobacillus/streoptococcus  1 capsule Oral Daily   . levofloxacin  500 mg Intravenous Q24H   . methylprednisolone  80 mg Intravenous Q8H SCH   . pantoprazole  40 mg Oral QAM AC   . polyethylene glycol  17 g Oral Daily   . tbo-filgrastim  300 mcg Subcutaneous Daily   . [COMPLETED] tigecycline  100 mg Intravenous Once   . tigecycline  50 mg Intravenous Q12H SCH          sodium chloride 200 mL/hr Continuous PRN   acetaminophen 650 mg Q4H PRN   acetaminophen 650 mg Q4H PRN   ALPRAZolam 0.25 mg QHS PRN   alteplase 2 mg Once PRN   alum & mag hydroxide-simethicone 30 mL Q4H PRN   bisacodyl 5 mg QD PRN   diphenhydrAMINE 50 mg PRN   diphenhydrAMINE-zinc acetate  TID PRN   EPINEPHrine 0.3 mg PRN   EPINEPHrine 0.1 mg PRN   hydrocortisone 100 mg PRN   lactulose 20 g BID PRN   lidocaine  PRN   loperamide 2 mg PRN   LORazepam 1 mg Q4H PRN   Or     LORazepam 1 mg Q4H PRN   Magic Mouthwash 30 mL Q4H PRN   morphine 2 mg Q2H PRN   naloxone 0.2 mg PRN    simethicone 80 mg Q6H PRN   sodium phosphate 1 enema QD PRN        Physical Exam:     Filed Vitals:    07/24/12 1347   BP: 114/56   Pulse: 85   Temp: 99.2 F (37.3 C)   Resp: 16   SpO2: 98%       Intake and Output Summary (Last 24 hours) at Date Time    Intake/Output Summary (Last 24 hours) at 07/24/12 1439  Last data filed at 07/24/12 0500   Gross per 24 hour   Intake   1200 ml   Output      0 ml   Net   1200 ml       Physical Exam   Constitutional: She is oriented to person, place, and time. She appears well-developed.   HENT: No oral lesions seen  Head: Normocephalic and atraumatic.   Eyes: Pupils are equal, round, and reactive to light.   Neck: No JVD present. No tracheal deviation  present. No thyromegaly present.   Cardiovascular: Normal rate and normal heart sounds.    Pulmonary/Chest: No respiratory distress. She has no wheezes. She has no rales.   Abdominal: She exhibits no distension. There is no tenderness. There is no rebound and no guarding.   Musculoskeletal: She exhibits no edema.   Neurological: She is alert and oriented to person, place, and time.           Labs:     Results     Procedure Component Value Units Date/Time    GFR [[id number]] Collected:07/24/12 0625     EGFR >60.0 Updated:07/24/12 0903    Comprehensive metabolic panel [[id number]]  (Abnormal) Collected:07/24/12 0625    Specimen Information:Blood Updated:07/24/12 0903     Glucose 135 (H) mg/dL      BUN 73.2 mg/dL      Creatinine 0.6 mg/dL      Sodium 202      Potassium 4.2      Chloride 107      CO2 26      CALCIUM 9.4 mg/dL      Protein, Total 5.9 (L) g/dL      Albumin 2.7 (L) g/dL      AST (SGOT) 21 U/L      ALT 54 U/L      Alkaline Phosphatase 60 U/L      Bilirubin, Total 0.7 mg/dL      Globulin 3.2 g/dL      Albumin/Globulin Ratio 0.8 (L)      Anion Gap 7.0     HEMOLYZED INDEX [[id number]]  (Abnormal) Collected:07/24/12 0625     Hemolyzed Index 62 (H) Updated:07/24/12 0903    Manual Differential [[id number]]  (Abnormal)  Collected:07/23/12 0601     Segmented Neutrophils 2 % Updated:07/24/12 0824     Band Neutrophils 0 %      Lymphocytes Manual 93 %      Monocytes Manual 5 %      Eosinophils Manual 0 %      Basophils Manual 0 %      Nucleated RBC 0      Abs Seg Manual 0.30 (L)      Bands Absolute 0.00      Absolute Lymph Manual 0.01 (L)      Monocytes Absolute 0.00      Absolute Eos Manual 0.00      Absolute Baso Manual 0.00     CBC and differential [[id number]]  (Abnormal) Collected:07/24/12 0625    Specimen Information:Blood / Blood Updated:07/24/12 0817     WBC 0.53 (L)      RBC 2.98 (L)      Hgb 9.4 (L) g/dL      Hematocrit 16.0 (L) %      MCV 90.9 fL      MCH 31.5 pg      MCHC 34.7 g/dL      RDW 14 %      Platelets 42 (L)      MPV 12.1 fL     Manual Differential [[id number]]  (Abnormal) Collected:07/24/12 0625     Segmented Neutrophils 14 % Updated:07/24/12 0817     Band Neutrophils 0 %      Lymphocytes Manual 72 %      Monocytes Manual 15 %      Eosinophils Manual 0 %      Basophils Manual 0 %      Nucleated RBC 0      Abs Seg Manual 0.07 (L)  Bands Absolute 0.00      Absolute Lymph Manual 0.38 (L)      Monocytes Absolute 0.08      Absolute Eos Manual 0.00      Absolute Baso Manual 0.00     Cell MorpHology [[id number]] Collected:07/24/12 0625     Cell Morphology: Normal Updated:07/24/12 0817    Culture Blood, Aerobic [[id number]] Collected:07/21/12 2247    Specimen Information:Blood / Blood, Intravenous Line Updated:07/24/12 0221    Narrative:    ORDER#: [id number]                                    ORDERED BY: DUNNING, DAVID  SOURCE: Blood, Intravenous Line right chest mediport COLLECTED:  07/21/12 22:47  ANTIBIOTICS AT COLL.:                                RECEIVED :  07/22/12 01:39  Culture Blood                              PRELIM      07/24/12 02:21  07/23/12   No Growth after 1 day/s of incubation.  07/24/12   No Growth after 2 day/s of incubation.      Culture Blood, Aerobic [[id number]] Collected:07/21/12 2247    Specimen  Information:Blood / Blood, Venipuncture Updated:07/24/12 0221    Narrative:    ORDER#: [id number]                                    ORDERED BY: DUNNING, DAVID  SOURCE: Blood, Venipuncture right hand               COLLECTED:  07/21/12 22:47  ANTIBIOTICS AT COLL.:                                RECEIVED :  07/22/12 01:39  Culture Blood                              PRELIM      07/24/12 02:21  07/23/12   No Growth after 1 day/s of incubation.  07/24/12   No Growth after 2 day/s of incubation.      Blood culture [[id number]] Collected:07/18/12 1832    Specimen Information:Blood / Blood Updated:07/24/12 0121    Narrative:    ORDER#: [id number]                                    ORDERED BY: Janalyn Rouse, IMTI  SOURCE: Blood R CHEST CENTRAL LINE                   COLLECTED:  07/18/12 18:32  ANTIBIOTICS AT COLL.:                                RECEIVED :  07/18/12 22:51  Culture Blood  FINAL       07/24/12 01:21  07/24/12   No growth after 5 days of incubation.      Blood culture [[id number]] Collected:07/18/12 1815    Specimen Information:Blood / Blood Updated:07/24/12 0121    Narrative:    ORDER#: [id number]                                    ORDERED BY: CHOUDHARY, IMTI  SOURCE: Blood PERIPHERAL                             COLLECTED:  07/18/12 18:15  ANTIBIOTICS AT COLL.:                                RECEIVED :  07/18/12 22:51  Culture Blood                              FINAL       07/24/12 01:21  07/24/12   No growth after 5 days of incubation.      Packed Red Blood Cells - Product [[id number]] Collected:07/23/12 1710     RBC Leukoreduced Irradiated [id number]    transfused Updated:07/24/12 0052    Blood culture [[id number]] Collected:07/22/12 1555    Specimen Information:Blood / Blood, Venipuncture Updated:07/23/12 2015    Narrative:    ORDER#: [id number]                                    ORDERED BY: AKSENTIJEVICH,  SOURCE: Blood, Venipuncture in the arm               COLLECTED:  07/22/12  15:55  ANTIBIOTICS AT COLL.:                                RECEIVED :  07/22/12 19:43  Culture Blood                              PRELIM      07/23/12 20:15  07/23/12   No Growth after 1 day/s of incubation.      Blood culture [[id number]] Collected:07/22/12 1555    Specimen Information:Blood / Blood, Intravenous Line Updated:07/23/12 2015    Narrative:    ORDER#: [id number]                                    ORDERED BY: AKSENTIJEVICH,  SOURCE: Blood, Intravenous Line right chest          COLLECTED:  07/22/12 15:55  ANTIBIOTICS AT COLL.:                                RECEIVED :  07/22/12 19:43  Culture Blood                              PRELIM      07/23/12 20:15  07/23/12   No Growth after  1 day/s of incubation.      Type and Screen [[id number]] Collected:07/23/12 1710    Specimen Information:Blood Updated:07/23/12 1925     ABO Rh O POS      AB Screen Gel NEG             Rads:     Xr Chest 2 Views    06/26/2012   CLINICAL INDICATION: pneumonia  COMPARISON: None available  FINDINGS:   2 views of the chest were obtained. The lungs are clear. The heart and vascularity are within normal limits.  There is no pleural thickening or effusion. The osseous structures are unremarkable.      06/26/2012   No active cardiopulmonary disease   Heron Nay, MD  06/26/2012 11:12 PM     Chest 2 Views    06/25/2012  CLINICAL INDICATION: Fever  COMPARISON: None.  INTERPRETATION: Frontal and lateral views of the chest obtained. Cardiomediastinal contour within normal limits for age. Clear lungs and sharp sulci.           06/25/2012   No acute cardiopulmonary process.  Filbert Schilder, MD  06/25/2012 10:43 PM         Signed by: Eric Form, MD  Pager: [phone]

## 2012-07-24 NOTE — Plan of Care (Addendum)
Problem: Infection/Potential for Infection  Goal: Free from infection  Outcome: Progressing  Pt continue to be on neutropenic isolation for WBC-0.53. Pt is afebrile. No distress noticed. Will continue to monitor    Comments:   Pt continue to have rash on chest, neck, and back. MD aware. Topical steroid creams applied. On IV antibiotics. Low grade fever in afternoon. Tylenol x 1 given. Pt had left foot injury due to hitting bedside commode last night. Mild swelling.  MD aware. No new order. Will continue to monitor.

## 2012-07-25 LAB — COMPREHENSIVE METABOLIC PANEL
ALT: 39 U/L (ref 0–55)
AST (SGOT): 14 U/L (ref 5–34)
Albumin/Globulin Ratio: 0.9 (ref 0.9–2.2)
Albumin: 2.3 g/dL — ABNORMAL LOW (ref 3.5–5.0)
Alkaline Phosphatase: 53 U/L (ref 40–150)
Anion Gap: 9 (ref 5.0–15.0)
BUN: 19 mg/dL (ref 7.0–19.0)
Bilirubin, Total: 0.4 mg/dL (ref 0.2–1.2)
CO2: 23 (ref 22–29)
Calcium: 8.8 mg/dL (ref 8.5–10.5)
Chloride: 108 — ABNORMAL HIGH (ref 98–107)
Creatinine: 0.5 mg/dL — ABNORMAL LOW (ref 0.6–1.0)
Globulin: 2.5 g/dL (ref 2.0–3.6)
Glucose: 109 mg/dL — ABNORMAL HIGH (ref 70–100)
Potassium: 4.1 (ref 3.5–5.1)
Protein, Total: 4.8 g/dL — ABNORMAL LOW (ref 6.0–8.3)
Sodium: 140 (ref 136–145)

## 2012-07-25 LAB — CBC AND DIFFERENTIAL
Hematocrit: 23 % — ABNORMAL LOW (ref 37.0–47.0)
Hgb: 8 g/dL — ABNORMAL LOW (ref 12.0–16.0)
MCH: 31.7 pg (ref 28.0–32.0)
MCHC: 34.8 g/dL (ref 32.0–36.0)
MCV: 91.3 fL (ref 80.0–100.0)
MPV: 10.9 fL (ref 9.4–12.3)
Platelets: 45 — ABNORMAL LOW (ref 140–400)
RBC: 2.52 — ABNORMAL LOW (ref 4.20–5.40)
RDW: 15 % (ref 12–15)
WBC: 2.02 — ABNORMAL LOW (ref 3.50–10.80)

## 2012-07-25 LAB — MAN DIFF ONLY
Band Neutrophils Absolute: 0.28 (ref 0.00–1.00)
Band Neutrophils: 14 %
Basophils Absolute Manual: 0 (ref 0.00–0.20)
Basophils Manual: 0 %
Eosinophils Absolute Manual: 0 (ref 0.00–0.70)
Eosinophils Manual: 0 %
Lymphocytes Absolute Manual: 0.83 (ref 0.50–4.40)
Lymphocytes Manual: 41 %
Metamyelocytes Absolute: 0.04 — ABNORMAL HIGH
Metamyelocytes: 2 %
Monocytes Absolute: 0.2 (ref 0.00–1.20)
Monocytes Manual: 10 %
Neutrophils Absolute Manual: 0.67 — ABNORMAL LOW (ref 1.80–8.10)
Nucleated RBC: 0 (ref 0–1)
Segmented Neutrophils: 33 %

## 2012-07-25 LAB — CELL MORPHOLOGY: Cell Morphology: NORMAL

## 2012-07-25 LAB — GFR: EGFR: >60

## 2012-07-25 LAB — HEMOLYSIS INDEX: Hemolysis Index: 41 — ABNORMAL HIGH (ref 0–18)

## 2012-07-25 NOTE — Progress Notes (Signed)
Daily PROGRESS NOTE    Date Time: 07/25/2012 5:39 PM  Patient Name: [name], [name]  Patient Status: Inpatient  Hospital Day: 30    Assessment:   Acute Leukemia, NOS   Neutropenic fever  .Pancytopenia.   .Weight loss with a body mass index of 18 shows mild to moderate   Malnutrition  . Skin rash.  Epigastric pain  Sinus brady  BM with Residual AML, hypocellular marrow    Plan:   1. Await marrow recovery, improving Neutropenia  2. Transfuse as needed, CSGF  3. Neutropenic precaution  4. D/cMerrrem, Vancomycin for possible reaction on sSolumedrol  5. Acyclovir, diflucan, Levaquin  6. Reduce solumedrol  7. D/C next week    Subjective:    Fatigue, fever, skin rashes improved  10 point Review of Systems - Negative except for the Positives mentioned above      Medications:     Current Facility-Administered Medications   Medication Dose Route Frequency   . acyclovir  400 mg Oral Q12H SCH   . docusate sodium  100 mg Oral Daily   . fluconazole  100 mg Intravenous Q24H SCH   . hydrocortisone   Topical BID   . lactobacillus/streoptococcus  1 capsule Oral Daily   . levofloxacin  500 mg Intravenous Q24H   . methylprednisolone  80 mg Intravenous Q12H   . pantoprazole  40 mg Oral QAM AC   . polyethylene glycol  17 g Oral Daily   . tbo-filgrastim  300 mcg Subcutaneous Daily   . tigecycline  50 mg Intravenous Q12H SCH          sodium chloride 200 mL/hr Continuous PRN   acetaminophen 650 mg Q4H PRN   acetaminophen 650 mg Q4H PRN   ALPRAZolam 0.25 mg QHS PRN   alteplase 2 mg Once PRN   alum & mag hydroxide-simethicone 30 mL Q4H PRN   bisacodyl 5 mg QD PRN   diphenhydrAMINE 50 mg PRN   diphenhydrAMINE-zinc acetate  TID PRN   EPINEPHrine 0.3 mg PRN   EPINEPHrine 0.1 mg PRN   hydrocortisone 100 mg PRN   lactulose 20 g BID PRN   lidocaine  PRN   loperamide 2 mg PRN   LORazepam 1 mg Q4H PRN   Or     LORazepam 1 mg Q4H PRN   Magic Mouthwash 30 mL Q4H PRN   morphine 2 mg Q2H PRN   naloxone 0.2 mg PRN   simethicone 80 mg Q6H PRN    sodium phosphate 1 enema QD PRN        Physical Exam:     Filed Vitals:    07/25/12 1207   BP: 107/65   Pulse: 78   Temp: 97.3 F (36.3 C)   Resp: 18   SpO2: 97%       Intake and Output Summary (Last 24 hours) at Date Time    Intake/Output Summary (Last 24 hours) at 07/25/12 1739  Last data filed at 07/24/12 1836   Gross per 24 hour   Intake    680 ml   Output      0 ml   Net    680 ml       Physical Exam   Constitutional: She is oriented to person, place, and time. She appears well-developed.   HENT: No oral lesions seen  Head: Normocephalic and atraumatic.   Eyes: Pupils are equal, round, and reactive to light.   Neck: No JVD present. No tracheal deviation present. No thyromegaly present.  Cardiovascular: Normal rate and normal heart sounds.    Pulmonary/Chest: No respiratory distress. She has no wheezes. She has no rales.   Abdominal: She exhibits no distension. There is no tenderness. There is no rebound and no guarding.   Musculoskeletal: She exhibits no edema.   Neurological: She is alert and oriented to person, place, and time.           Labs:     Results     Procedure Component Value Units Date/Time    Cell MorpHology [[id number]] Collected:07/25/12 0554     Cell Morphology: Normal Updated:07/25/12 0858    Manual Differential [[id number]]  (Abnormal) Collected:07/25/12 0554     Segmented Neutrophils 33 % Updated:07/25/12 0858     Band Neutrophils 14 %      Lymphocytes Manual 41 %      Monocytes Manual 10 %      Eosinophils Manual 0 %      Basophils Manual 0 %      Metamyelocytes 2 %      Nucleated RBC 0      Abs Seg Manual 0.67 (L)      Bands Absolute 0.28      Absolute Lymph Manual 0.83      Monocytes Absolute 0.20      Absolute Eos Manual 0.00      Absolute Baso Manual 0.00      Metamyelocytes Absolute 0.04 (H)     CBC and differential [[id number]]  (Abnormal) Collected:07/25/12 0554    Specimen Information:Blood / Blood Updated:07/25/12 0857     WBC 2.02 (L)      RBC 2.52 (L)      Hgb 8.0 (L) g/dL       Hematocrit 40.9 (L) %      MCV 91.3 fL      MCH 31.7 pg      MCHC 34.8 g/dL      RDW 15 %      Platelets 45 (L)      MPV 10.9 fL     Comprehensive metabolic panel [[id number]]  (Abnormal) Collected:07/25/12 0554    Specimen Information:Blood Updated:07/25/12 0856     Glucose 109 (H) mg/dL      BUN 81.1 mg/dL      Creatinine 0.5 (L) mg/dL      Sodium 914      Potassium 4.1      Chloride 108 (H)      CO2 23      CALCIUM 8.8 mg/dL      Protein, Total 4.8 (L) g/dL      Albumin 2.3 (L) g/dL      AST (SGOT) 14 U/L      ALT 39 U/L      Alkaline Phosphatase 53 U/L      Bilirubin, Total 0.4 mg/dL      Globulin 2.5 g/dL      Albumin/Globulin Ratio 0.9      Anion Gap 9.0     HEMOLYZED INDEX [[id number]]  (Abnormal) Collected:07/25/12 0554     Hemolyzed Index 41 (H) Updated:07/25/12 0856    GFR [[id number]] Collected:07/25/12 0554     EGFR >60.0 Updated:07/25/12 0856    Culture Blood, Aerobic [[id number]] Collected:07/21/12 2247    Specimen Information:Blood / Blood, Venipuncture Updated:07/25/12 0221    Narrative:    ORDER#: [id number]  ORDERED BY: DUNNING, DAVID  SOURCE: Blood, Venipuncture right hand               COLLECTED:  07/21/12 22:47  ANTIBIOTICS AT COLL.:                                RECEIVED :  07/22/12 01:39  Culture Blood                              PRELIM      07/25/12 02:21  07/23/12   No Growth after 1 day/s of incubation.  07/24/12   No Growth after 2 day/s of incubation.  07/25/12   No Growth after 3 day/s of incubation.      Culture Blood, Aerobic [[id number]] Collected:07/21/12 2247    Specimen Information:Blood / Blood, Intravenous Line Updated:07/25/12 0221    Narrative:    ORDER#: [id number]                                    ORDERED BY: DUNNING, DAVID  SOURCE: Blood, Intravenous Line right chest mediport COLLECTED:  07/21/12 22:47  ANTIBIOTICS AT COLL.:                                RECEIVED :  07/22/12 01:39  Culture Blood                              PRELIM      07/25/12  02:21  07/23/12   No Growth after 1 day/s of incubation.  07/24/12   No Growth after 2 day/s of incubation.  07/25/12   No Growth after 3 day/s of incubation.      Blood culture [[id number]] Collected:07/22/12 1555    Specimen Information:Blood / Blood, Intravenous Line Updated:07/24/12 2015    Narrative:    ORDER#: [id number]                                    ORDERED BY: AKSENTIJEVICH,  SOURCE: Blood, Intravenous Line right chest          COLLECTED:  07/22/12 15:55  ANTIBIOTICS AT COLL.:                                RECEIVED :  07/22/12 19:43  Culture Blood                              PRELIM      07/24/12 20:15  07/23/12   No Growth after 1 day/s of incubation.  07/24/12   No Growth after 2 day/s of incubation.      Blood culture [[id number]] Collected:07/22/12 1555    Specimen Information:Blood / Blood, Venipuncture Updated:07/24/12 2015    Narrative:    ORDER#: [id number]                                    ORDERED BY: AKSENTIJEVICH,  SOURCE: Blood, Venipuncture in the  arm               COLLECTED:  07/22/12 15:55  ANTIBIOTICS AT COLL.:                                RECEIVED :  07/22/12 19:43  Culture Blood                              PRELIM      07/24/12 20:15  07/23/12   No Growth after 1 day/s of incubation.  07/24/12   No Growth after 2 day/s of incubation.              Rads:     Xr Chest 2 Views    06/26/2012   CLINICAL INDICATION: pneumonia  COMPARISON: None available  FINDINGS:   2 views of the chest were obtained. The lungs are clear. The heart and vascularity are within normal limits.  There is no pleural thickening or effusion. The osseous structures are unremarkable.      06/26/2012   No active cardiopulmonary disease   Heron Nay, MD  06/26/2012 11:12 PM     Chest 2 Views    06/25/2012  CLINICAL INDICATION: Fever  COMPARISON: None.  INTERPRETATION: Frontal and lateral views of the chest obtained. Cardiomediastinal contour within normal limits for age. Clear lungs and sharp sulci.           06/25/2012    No acute cardiopulmonary process.  Filbert Schilder, MD  06/25/2012 10:43 PM         Signed by: Eric Form, MD  Pager: [phone]

## 2012-07-25 NOTE — Plan of Care (Signed)
Problem: Safety  Goal: Patient will be free from injury during hospitalization  Outcome: Progressing  Pt. Will be free from fall, during this admission, fall precaution in place. Pt. Maintained neutropenic perception.      Problem: Pain  Goal: Patient's pain/discomfort is manageable  Outcome: Progressing  Pt. C/O back pain, tylenol 650 mg Po x1 with good result. Warm compress offered, pt. Reported she feel better with tylenol and may be use it later.No N/V during shift, pt. Denies SOB or resp. Distress.

## 2012-07-25 NOTE — Progress Notes (Signed)
Infectious Diseases [facility] T[facility] Medicine  Progress Note    07/25/2012   [name] FAO:[id number],GEX:[id number] is a 57 y.o. female,       Assessment:     Acute leukemia s/p chemotherapy  Groshong catheter in place  Febrile pancytopenia / neutropenia improving  CXR - NAD  Fever improved  Generalized rash improving  Blood cultures - NGTD 07/21/12  Urine cultures no growth    Plan:      Continue tygacil and Levaquin   Continue solumedrol for 2-3 more days   Continue Diflucan and acyclovir   Continue probiotics   On G- CSF   Reviewed notes   Monitor clinically   Discussed with patient at length   D/W Dr Mylo Red    ROS:     General:   Persistent fever, no chills, no rigor,feeling better   HEENT: no neck pain, no throat pain  Endocrine:no fatigue  Respiratory: no cough,no shortness of breath, or wheezing   Cardiovascular: no chest pain   Gastrointestinal: no abdominal pain,no N/V/D  Genito-Urinary: no dysuria, trouble voiding, or hematuria  Musculoskeletal: no edema, myalgias, leg swellling  Neurological: c/o generalized weakness  Dermatological:  Generalized maculopapular rash improving, no ulcer    Physical Examination:     Blood pressure 114/72, pulse 78, temperature 98.4 F (36.9 C), temperature source Oral, resp. rate 17, height 1.651 m (5\' 5" ), weight 51.823 kg (114 lb 4 oz), SpO2 97.00%.     General Appearance: Comfortable, and in no acute distress. Awake and alert,no new issues   HEENT: Pupils are equal, round, and reactive to light.    Lungs:  CTA   Heart: RRR   Chest: Symmetric chest wall expansion.   Abdomen: soft ,non tender,no hepatosplenomegaly   Neurological: No focal deficit   Extremities: No edema    Laboratory And Diagnostic Studies:     Recent Labs   [facility] 07/25/12 0554 07/24/12 0625    WBC 2.02* 0.53*    HGB 8.0* 9.4*    HCT 23.0* 27.1*    PLT 45* 42*     Recent Labs   Basename 07/25/12 0554 07/24/12 0625    NA 140 140    K 4.1 4.2    CL 108* 107    CO2 23 26    BUN 19.0 14.0     CREAT 0.5* 0.6    GLU 109* 135*    CA 8.8 9.4     Recent Labs   Basename 07/25/12 0554 07/24/12 0625    AST 14 21    ALT 39 54    ALKPHOS 53 60    PROT 4.8* 5.9*    ALB 2.3* 2.7*       Current Meds:      Scheduled Meds: PRN Meds:           acyclovir 400 mg Oral Q12H SCH   docusate sodium 100 mg Oral Daily   fluconazole 100 mg Intravenous Q24H SCH   hydrocortisone  Topical BID   lactobacillus/streoptococcus 1 capsule Oral Daily   levofloxacin 500 mg Intravenous Q24H   methylprednisolone 80 mg Intravenous Q12H   pantoprazole 40 mg Oral QAM AC   polyethylene glycol 17 g Oral Daily   tbo-filgrastim 300 mcg Subcutaneous Daily   tigecycline 50 mg Intravenous Q12H [facility]   [DISCONTINUED] methylprednisolone 80 mg Intravenous Q8H SCH       Continuous Infusions:       . sodium chloride 50 mL/hr at 07/24/12 1836   . sodium  chloride           sodium chloride 200 mL/hr Continuous PRN   acetaminophen 650 mg Q4H PRN   acetaminophen 650 mg Q4H PRN   ALPRAZolam 0.25 mg QHS PRN   alteplase 2 mg Once PRN   alum & mag hydroxide-simethicone 30 mL Q4H PRN   bisacodyl 5 mg QD PRN   diphenhydrAMINE 50 mg PRN   diphenhydrAMINE-zinc acetate  TID PRN   EPINEPHrine 0.3 mg PRN   EPINEPHrine 0.1 mg PRN   hydrocortisone 100 mg PRN   lactulose 20 g BID PRN   lidocaine  PRN   loperamide 2 mg PRN   LORazepam 1 mg Q4H PRN   Or     LORazepam 1 mg Q4H PRN   Magic Mouthwash 30 mL Q4H PRN   morphine 2 mg Q2H PRN   naloxone 0.2 mg PRN   simethicone 80 mg Q6H PRN   sodium phosphate 1 enema QD PRN         [name], M.D.  07/25/2012  11:06 AM

## 2012-07-25 NOTE — Progress Notes (Signed)
HEMATOLOGY ONCOLOGY PROGRESS NOTE    Date Time: 07/25/2012 10:25 AM  Patient Name: [name], [name]      IMPRESSION/PLAN:   1. Acute Leukemia M0.  Possibly not in remission.  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  Will not re-induce at this time.  Will allow patient's counts and overall condition to recover.  This will also allow to clarify the picture.  Continue G-CSF.  2. Neutropenic fever. WBC coming up.  ANC greater than 700.  Patient does not look toxic.  Antibiotics as per Dr. Janalyn Rouse.  3. Pancytopenia.  Transfuse as needed as per Hospital guidelines.    4. Skin rash.  Possibly drug allergy.  Slightly better with antibiotic change and steroids.    5. Left foot hematoma.  Foot elevation and observation.  No need for x-ray.    HISTORY:    [name] is a 57 year old who present with fevers, arthralgias, myalgias, and some rhinorrhea.   A CBC at the time of admission revealed a WBC of 0.84, hemoglobin 11.4, hematocrit 34.2 and a platelet count of 107.  Her absolute neutrophil count was 0.39.  A bone marrow showed AML.     INTERVAL HISTORY:    She received a unit of PRBC yesterday. She looks better this morning. Something fell on her left foot and she sustained a hematoma.  She has no pain and has no problems walking.  She is not dizzy when out of bed.  No diarrhea. No SOB, back or chest pain.     PHYSICAL EXAM:     Filed Vitals:    07/25/12 0807   BP: 114/72   Pulse: 78   Temp: 98.4 F (36.9 C)   Resp: 17   SpO2: 97%     GENERAL: Thin.  In no acute distress.   HEENT: Pharynx clear.  PERRL.  Sclera clear.  Conjunctivae pale.   Lungs: Clear to ausculation.  Heart: Regular rate and rhythm, normal heart sounds.  Groshong catheter in place.   Abdomen: Flat and soft, active bowel sounds  Extremities: No edema.  No clubbing.  Left foot hematoma.  Neuro: Alert, appropriate.  Not focal.  Skin: Rash on chest, abdomen and back.     MEDS:   Scheduled Meds:  Current  Facility-Administered Medications   Medication Dose Route Frequency   . acyclovir  400 mg Oral Q12H SCH   . docusate sodium  100 mg Oral Daily   . fluconazole  100 mg Intravenous Q24H SCH   . hydrocortisone   Topical BID   . lactobacillus/streoptococcus  1 capsule Oral Daily   . levofloxacin  500 mg Intravenous Q24H   . methylprednisolone  80 mg Intravenous Q12H   . pantoprazole  40 mg Oral QAM AC   . polyethylene glycol  17 g Oral Daily   . tbo-filgrastim  300 mcg Subcutaneous Daily   . tigecycline  50 mg Intravenous Q12H SCH   . [DISCONTINUED] methylprednisolone  80 mg Intravenous Q8H SCH     Continuous Infusions:       . sodium chloride 50 mL/hr at 07/24/12 1836   . sodium chloride       PRN Meds:.sodium chloride, acetaminophen, acetaminophen, ALPRAZolam, alteplase, alum & mag hydroxide-simethicone, bisacodyl, diphenhydrAMINE, diphenhydrAMINE-zinc acetate, EPINEPHrine, EPINEPHrine, hydrocortisone, lactulose, lidocaine, loperamide, LORazepam, LORazepam, Magic Mouthwash, morphine, naloxone, simethicone, sodium phosphate    LABS:     Results     Procedure Component Value Units Date/Time    Cell  MorpHology [[id number]] Collected:07/25/12 0554     Cell Morphology: Normal Updated:07/25/12 0858    Manual Differential [[id number]]  (Abnormal) Collected:07/25/12 0554     Segmented Neutrophils 33 % Updated:07/25/12 0858     Band Neutrophils 14 %      Lymphocytes Manual 41 %      Monocytes Manual 10 %      Eosinophils Manual 0 %      Basophils Manual 0 %      Metamyelocytes 2 %      Nucleated RBC 0      Abs Seg Manual 0.67 (L)      Bands Absolute 0.28      Absolute Lymph Manual 0.83      Monocytes Absolute 0.20      Absolute Eos Manual 0.00      Absolute Baso Manual 0.00      Metamyelocytes Absolute 0.04 (H)     CBC and differential [[id number]]  (Abnormal) Collected:07/25/12 0554    Specimen Information:Blood / Blood Updated:07/25/12 0857     WBC 2.02 (L)      RBC 2.52 (L)      Hgb 8.0 (L) g/dL      Hematocrit 52.8 (L) %       MCV 91.3 fL      MCH 31.7 pg      MCHC 34.8 g/dL      RDW 15 %      Platelets 45 (L)      MPV 10.9 fL     Comprehensive metabolic panel [[id number]]  (Abnormal) Collected:07/25/12 0554    Specimen Information:Blood Updated:07/25/12 0856     Glucose 109 (H) mg/dL      BUN 41.3 mg/dL      Creatinine 0.5 (L) mg/dL      Sodium 244      Potassium 4.1      Chloride 108 (H)      CO2 23      CALCIUM 8.8 mg/dL      Protein, Total 4.8 (L) g/dL      Albumin 2.3 (L) g/dL      AST (SGOT) 14 U/L      ALT 39 U/L      Alkaline Phosphatase 53 U/L      Bilirubin, Total 0.4 mg/dL      Globulin 2.5 g/dL      Albumin/Globulin Ratio 0.9      Anion Gap 9.0     HEMOLYZED INDEX [[id number]]  (Abnormal) Collected:07/25/12 0554     Hemolyzed Index 41 (H) Updated:07/25/12 0856    GFR [[id number]] Collected:07/25/12 0554     EGFR >60.0 Updated:07/25/12 0856    Culture Blood, Aerobic [[id number]] Collected:07/21/12 2247    Specimen Information:Blood / Blood, Venipuncture Updated:07/25/12 0221    Narrative:    ORDER#: [id number]                                    ORDERED BY: DUNNING, DAVID  SOURCE: Blood, Venipuncture right hand               COLLECTED:  07/21/12 22:47  ANTIBIOTICS AT COLL.:                                RECEIVED :  07/22/12 01:39  Culture Blood  PRELIM      07/25/12 02:21  07/23/12   No Growth after 1 day/s of incubation.  07/24/12   No Growth after 2 day/s of incubation.  07/25/12   No Growth after 3 day/s of incubation.      Culture Blood, Aerobic [[id number]] Collected:07/21/12 2247    Specimen Information:Blood / Blood, Intravenous Line Updated:07/25/12 0221    Narrative:    ORDER#: [id number]                                    ORDERED BY: DUNNING, DAVID  SOURCE: Blood, Intravenous Line right chest mediport COLLECTED:  07/21/12 22:47  ANTIBIOTICS AT COLL.:                                RECEIVED :  07/22/12 01:39  Culture Blood                              PRELIM      07/25/12 02:21  07/23/12   No  Growth after 1 day/s of incubation.  07/24/12   No Growth after 2 day/s of incubation.  07/25/12   No Growth after 3 day/s of incubation.      Blood culture [[id number]] Collected:07/22/12 1555    Specimen Information:Blood / Blood, Intravenous Line Updated:07/24/12 2015    Narrative:    ORDER#: [id number]                                    ORDERED BY: Sayid Moll,  SOURCE: Blood, Intravenous Line right chest          COLLECTED:  07/22/12 15:55  ANTIBIOTICS AT COLL.:                                RECEIVED :  07/22/12 19:43  Culture Blood                              PRELIM      07/24/12 20:15  07/23/12   No Growth after 1 day/s of incubation.  07/24/12   No Growth after 2 day/s of incubation.      Blood culture [[id number]] Collected:07/22/12 1555    Specimen Information:Blood / Blood, Venipuncture Updated:07/24/12 2015    Narrative:    ORDER#: [id number]                                    ORDERED BY: Bereket Gernert,  SOURCE: Blood, Venipuncture in the arm               COLLECTED:  07/22/12 15:55  ANTIBIOTICS AT COLL.:                                RECEIVED :  07/22/12 19:43  Culture Blood                              PRELIM      07/24/12 20:15  07/23/12   No Growth after 1 day/s of incubation.  07/24/12   No Growth after 2 day/s of incubation.            IMAGING DATA:  Xr Chest 2 Views    06/26/2012   CLINICAL INDICATION: pneumonia  COMPARISON: None available  FINDINGS:   2 views of the chest were obtained. The lungs are clear. The heart and vascularity are within normal limits.  There is no pleural thickening or effusion. The osseous structures are unremarkable.      06/26/2012   No active cardiopulmonary disease   Heron Nay, MD  06/26/2012 11:12 PM     Chest 2 Views    06/25/2012  CLINICAL INDICATION: Fever  COMPARISON: None.  INTERPRETATION: Frontal and lateral views of the chest obtained. Cardiomediastinal contour within normal limits for age. Clear lungs and sharp sulci.           06/25/2012   No acute  cardiopulmonary process.  Filbert Schilder, MD  06/25/2012 10:43 PM      Sharmon Revere, MD  [facility]

## 2012-07-25 NOTE — Plan of Care (Signed)
Problem: Pain  Goal: Patient's pain/discomfort is manageable  Outcome: Progressing  Pt complaint of lower back pain. Prn tylenol given. Pain management effective.     Comments:   Continue to have rash on back and chest which is getting better per patient. On IV antibiotics. Complaint of bloating and heartburn. maalox prn given. Effective. Continue to have bruise on left foot. Pt states its getting better. Worked with physical therapist. Up in chair. Out of room with physical therapist

## 2012-07-25 NOTE — Plan of Care (Signed)
Problem: Infection/Potential for Infection  Goal: Free from infection  Outcome: Progressing  Pt continue to be on neutropenic precautions. Pt is afebrile. Will continue to monitor

## 2012-07-25 NOTE — Plan of Care (Signed)
Problem: Physical Therapy  Goal: Patient condition is improving per Physical Therapy Treatment Plan  Outcome: Completed Date Met:  07/25/12  Please see Physical Therapy Evaluation.

## 2012-07-25 NOTE — PT Eval Note (Signed)
[facility]  Physical Therapy Evaluation and Treatment    Patient: [name]     MRN#: [id number]   Unit: [facility]  Bed: A2118/A2118-A    Time of Evaluation:  Time Calculation  PT Received On: 07/25/2012  Start Time: 1400  Stop Time: 1415  Time Calculation (min): 15    Time of Treatment:  Time Calculation  PT Received On: 07/25/2012  Start Time: 1416  Stop Time: 1445  Time Calculation (min): 29    Consult received for [name] for PT Evaluation and Treatment.  Patient's medical condition is appropriate for Physical therapy intervention at this time.    Activity Orders: eval and treat     Precautions and Contraindications:   Precautions  Weight Bearing Status: no restrictions  Other Precautions: Neutropenic precautions    Medical Diagnosis: Fever [780.60]  Neutropenia [288.00]  ................Marland Kitchen  Acute Leukemia.  Wil need high intensity chenotherapy    History of Present Illness: [name] is a 57 y.o. female admitted on 06/25/2012 with fever of 102 degrees Farenheit and was found to be neutropenic with WBC of 0.84 with about 50% neutrophils. Bone marrow biopsy performed on 6/16 and pt diagnosed with acute leukemia. Patient being treated with G-CSF      Patient Active Problem List   Diagnosis   . Chondromalacia of patella   . Neutropenic fever        Past Medical/Surgical History:  Past Medical History   Diagnosis Date   . Asthma without status asthmaticus       Past Surgical History   Procedure Date   . Hand surgery          X-Rays/Tests/Labs:  Lab Results   Component Value Date/Time    HGB 8.0* 07/25/2012  5:54 AM    HCT 23.0* 07/25/2012  5:54 AM    K 4.1 07/25/2012  5:54 AM    NA 140 07/25/2012  5:54 AM    INR 1.0 06/29/2012  8:59 AM     XR Chest AP Portable 07/21/2012  Impression: No acute process    CT Sinus Facial Bones WO Contrast 06/27/2012  Impression: Normal          Social History:  Prior Level of Function  Prior level of function: Ambulates / Performs  ADL's independently  Assistive Device: None  Baseline Activity Level: Community ambulation    Home Living Arrangements  Living Arrangements: Other (Comment)  Type of Home: House  Home Layout: One level;Stairs to enter without rails (add number in comment)  Bathroom Accessibility: Accessible    Subjective: Patient is agreeable to participation in the therapy session.   Patient Goal:  (get out of here )  Pain Assessment  Pain Assessment: Numeric Scale (0-10)  Pain Score: 2-mild pain  Pain Location: Foot  Pain Orientation: Left    Objective:  Observation of Patient/Vital Signs: VSS    Patient received in bed with Intravenous (IV) in place.    Inspection/Posture: sitting up in chair    Cognitive Status and Neuro Exam:  Cognition  Arousal/Alertness: Appropriate responses to stimuli  Following Commands: Follows all commands and directions without difficulty  Neuro Status  Behavior: calm cooperative  Safety Awareness: intact    Musculoskeletal Examination  Gross ROM  Neck/Trunk ROM: within functional limits  Right Lower Extremity ROM: within functional limits  Left Lower Extremity ROM: within functional limits  Gross Strength  Right Lower Extremity Strength: within functional limits  Left  Lower Extremity Strength: within functional limits       Functional Mobility:  Functional Mobility  Rolling: Stand by assistance  Supine to Sit: Stand by assistance  Scooting: Stand by assistance  Sit to Supine: Stand by assistance  Sit to Stand: Stand by assistance  Stand to Sit: Stand by assistance  Transfers  Bed to Chair: Stand by assistance  Locomotion  Ambulation: stand by assistance  Ambulation Distance (Feet):  (400)  Pattern: decreased cadence     Balance  Balance  Balance: needs focused assessment  Sitting - Static: Good  Sitting - Dynamic: Good  Standing - Static: Good  Standing - Dynamic: Fair    Participation and Activity Tolerance  Participation and Endurance  Participation Effort: good  Endurance: Tolerates < 10 min  exercise with changes in vital signs    Educated the patient to role of physical therapy, plan of care, goals of therapy and safety with mobility and ADLs.   Patient left in hall with  friend in place and call bell within reach. RN notified of session outcome.     Assessment: [name] is a 57 y.o. female admitted 06/25/2012 who presents with the following impairments: Assessment: Decreased balance.    Pt would benefit from Physical Therapy to f/u treatment for stair assessment and AD training with SPC if instability is noted for [place].     Treatment: Pt ambulated 400 ft x 2 without AD with cga for safety. Pt reports subjective instability with gait and mild lightheadedness.       Plan: Treatment/Interventions: Gait training;Neuromuscular re-education   PT Frequency: one time visit   Risks/Benefits/POC Discussed with Pt/Family: With patient     Goals:   Goals  Goal Formulation: With patient/family  Time for Goal Acheivement: 1 visit  Goals: Select goal  Pt Will Ambulate: > 200 feet;with single point cane  Pt Will Go Up / Down Stairs: 6-10 stairs;With stand by assist  Pt Will Perform Home Exer Program: Independently        DME Recommended for Discharge: Single point cane  Discharge Recommendation: Home with supervision    Madelon Lips, MPT   Physical Therapist  License # [id number]  [phone]

## 2012-07-26 LAB — CBC AND DIFFERENTIAL
Hematocrit: 24 % — ABNORMAL LOW (ref 37.0–47.0)
Hgb: 8.2 g/dL — ABNORMAL LOW (ref 12.0–16.0)
MCH: 31.4 pg (ref 28.0–32.0)
MCHC: 34.2 g/dL (ref 32.0–36.0)
MCV: 92 fL (ref 80.0–100.0)
MPV: 11.2 fL (ref 9.4–12.3)
Platelets: 50 — ABNORMAL LOW (ref 140–400)
RBC: 2.61 — ABNORMAL LOW (ref 4.20–5.40)
RDW: 14 % (ref 12–15)
WBC: 10.09 (ref 3.50–10.80)

## 2012-07-26 LAB — MAN DIFF ONLY
Band Neutrophils Absolute: 2.12 — ABNORMAL HIGH (ref 0.00–1.00)
Band Neutrophils: 21 %
Basophils Absolute Manual: 0 (ref 0.00–0.20)
Basophils Manual: 0 %
Eosinophils Absolute Manual: 0 (ref 0.00–0.70)
Eosinophils Manual: 0 %
Lymphocytes Absolute Manual: 0.81 (ref 0.50–4.40)
Lymphocytes Manual: 8 %
Monocytes Absolute: 1.61 — ABNORMAL HIGH (ref 0.00–1.20)
Monocytes Manual: 16 %
Myelocytes Absolute: 0.4 — ABNORMAL HIGH
Myelocytes: 4 %
Neutrophils Absolute Manual: 5.15 (ref 1.80–8.10)
Nucleated RBC: 0 (ref 0–1)
Segmented Neutrophils: 51 %

## 2012-07-26 LAB — COMPREHENSIVE METABOLIC PANEL
ALT: 37 U/L (ref 0–55)
AST (SGOT): 18 U/L (ref 5–34)
Albumin/Globulin Ratio: 1 (ref 0.9–2.2)
Albumin: 2.3 g/dL — ABNORMAL LOW (ref 3.5–5.0)
Alkaline Phosphatase: 65 U/L (ref 40–150)
Anion Gap: 8 (ref 5.0–15.0)
BUN: 16 mg/dL (ref 7.0–19.0)
Bilirubin, Total: 0.3 mg/dL (ref 0.2–1.2)
CO2: 26 (ref 22–29)
Calcium: 8.7 mg/dL (ref 8.5–10.5)
Chloride: 106 (ref 98–107)
Creatinine: 0.6 mg/dL (ref 0.6–1.0)
Globulin: 2.2 g/dL (ref 2.0–3.6)
Glucose: 105 mg/dL — ABNORMAL HIGH (ref 70–100)
Potassium: 3.9 (ref 3.5–5.1)
Protein, Total: 4.5 g/dL — ABNORMAL LOW (ref 6.0–8.3)
Sodium: 140 (ref 136–145)

## 2012-07-26 LAB — HEMOLYSIS INDEX: Hemolysis Index: 2 (ref 0–18)

## 2012-07-26 LAB — CELL MORPHOLOGY: Cell Morphology: NORMAL

## 2012-07-26 LAB — GFR: EGFR: >60

## 2012-07-26 NOTE — Plan of Care (Signed)
Problem: Safety  Goal: Patient will be free from injury during hospitalization  Outcome: Progressing  Pt. Will be free from fall during this hospitalization, fall precaution in place, assisted as needed, call bell in hand.    Problem: Pain  Goal: Patient's pain/discomfort is manageable  Outcome: Progressing  Pt. C/O back pain, tylenol 650 mg Po given x2 with relief. Maalox 5ml x2 per pt. request for ingestion, pt. reported it helps. Pt. Denies SOB or any resp. Distress, assisted as needed, hourly round maintained.

## 2012-07-26 NOTE — Plan of Care (Signed)
Patient A&ox3 kept  on neutropenic Isolation, was able to ambulate without assist. Appetite  is fair. Vitals with in normal rang  will continue to  Care.

## 2012-07-26 NOTE — Progress Notes (Signed)
Infectious Diseases & Tropical Medicine  Progress Note    07/26/2012   Erabella Kuipers EAV:[id number],[id number] is a 57 y.o. female,       Assessment:     Acute leukemia s/p chemotherapy  Groshong catheter in place  Febrile pancytopenia / neutropenia-resolved   CXR - NAD  Fever resolved  Liver function tests within normal limits  Generalized rash improving  Blood cultures - NGTD 07/21/12  Urine cultures no growth  Clinically stable    Plan:      Continue tygacil and Levaquin today   Continue solumedrol for 2-3 more days   Continue Diflucan and acyclovir   Continue probiotics   On G- CSF   Reviewed notes   Monitor clinically   Discharge planning once cleared by oncology    ROS:     General:   Persistent fever, no chills, no rigor,feeling better   HEENT: no neck pain, no throat pain  Endocrine:no fatigue  Respiratory: no cough,no shortness of breath, or wheezing   Cardiovascular: no chest pain   Gastrointestinal: no abdominal pain,no N/V/D  Genito-Urinary: no dysuria, trouble voiding, or hematuria  Musculoskeletal: no edema, complains of back pain  Neurological: c/o generalized weakness  Dermatological:  Generalized maculopapular rash improving, no ulcer    Physical Examination:     Blood pressure 117/69, pulse 78, temperature 98.3 F (36.8 C), temperature source Oral, resp. rate 18, height 1.651 m (5\' 5" ), weight 51.823 kg (114 lb 4 oz), SpO2 96.00%.     General Appearance: Comfortable, and in no acute distress. Awake and alert, feeling better   HEENT: Pupils are equal, round, and reactive to light.    Lungs:   Scattered rhonchi   Heart: RRR   Chest: Symmetric chest wall expansion.   Abdomen: soft ,non tender,no hepatosplenomegaly   Neurological: No focal deficit   Extremities: No edema    Laboratory And Diagnostic Studies:     Recent Labs   [facility] 07/26/12 0632 07/25/12 0554    WBC 10.09 2.02*    HGB 8.2* 8.0*    HCT 24.0* 23.0*    PLT 50* 45*     Recent Labs   [facility] 07/26/12 0633 07/25/12  0554    NA 140 140    K 3.9 4.1    CL 106 108*    CO2 26 23    BUN 16.0 19.0    CREAT 0.6 0.5*    GLU 105* 109*    CA 8.7 8.8     Recent Labs   [facility] 07/26/12 0633 07/25/12 0554    AST 18 14    ALT 37 39    ALKPHOS 65 53    PROT 4.5* 4.8*    ALB 2.3* 2.3*       Current Meds:      Scheduled Meds: PRN Meds:           acyclovir 400 mg Oral Q12H SCH   docusate sodium 100 mg Oral Daily   fluconazole 100 mg Intravenous Q24H SCH   hydrocortisone  Topical BID   lactobacillus/streoptococcus 1 capsule Oral Daily   levofloxacin 500 mg Intravenous Q24H   methylprednisolone 80 mg Intravenous Q12H   pantoprazole 40 mg Oral QAM AC   polyethylene glycol 17 g Oral Daily   tbo-filgrastim 300 mcg Subcutaneous Daily   tigecycline 50 mg Intravenous Q12H SCH       Continuous Infusions:       . sodium chloride 50 mL/hr at 07/25/12 1827   .  sodium chloride           sodium chloride 200 mL/hr Continuous PRN   acetaminophen 650 mg Q4H PRN   acetaminophen 650 mg Q4H PRN   ALPRAZolam 0.25 mg QHS PRN   alteplase 2 mg Once PRN   alum & mag hydroxide-simethicone 30 mL Q4H PRN   bisacodyl 5 mg QD PRN   diphenhydrAMINE 50 mg PRN   diphenhydrAMINE-zinc acetate  TID PRN   EPINEPHrine 0.3 mg PRN   EPINEPHrine 0.1 mg PRN   hydrocortisone 100 mg PRN   lactulose 20 g BID PRN   lidocaine  PRN   loperamide 2 mg PRN   LORazepam 1 mg Q4H PRN   Or     LORazepam 1 mg Q4H PRN   Magic Mouthwash 30 mL Q4H PRN   morphine 2 mg Q2H PRN   naloxone 0.2 mg PRN   simethicone 80 mg Q6H PRN   sodium phosphate 1 enema QD PRN         Yulianna Folse A. Janalyn Rouse, M.D.  07/26/2012  9:59 AM

## 2012-07-26 NOTE — Progress Notes (Signed)
Daily PROGRESS NOTE    Date Time: 07/26/2012 1:31 PM  Patient Name: [name], [name]  Patient Status: Inpatient  Hospital Day: 31    Assessment:   Acute Leukemia, NOS   Neutropenic fever   .Pancytopenia.   .Weight loss with a body mass index of 18 shows mild to moderate   Malnutrition  . Skin rash.  Epigastric pain  Sinus brady  BM with Residual AML, hypocellular marrow    Plan:   1. resolved Neutropenia  2. Transfuse as needed, CSGF  3. D/c Neutropenic precaution per ID  4. Acyclovir, diflucan, Levaquin  5. D/c solumedrol  6. D/C next 24-48hrs    Subjective:    Fatigue, Dizziness-light headed  10 point Review of Systems - Negative except for the Positives mentioned above      Medications:     Current Facility-Administered Medications   Medication Dose Route Frequency   . acyclovir  400 mg Oral Q12H SCH   . docusate sodium  100 mg Oral Daily   . fluconazole  100 mg Intravenous Q24H SCH   . hydrocortisone   Topical BID   . lactobacillus/streoptococcus  1 capsule Oral Daily   . levofloxacin  500 mg Intravenous Q24H   . methylprednisolone  80 mg Intravenous Q12H   . pantoprazole  40 mg Oral QAM AC   . polyethylene glycol  17 g Oral Daily   . tbo-filgrastim  300 mcg Subcutaneous Daily   . tigecycline  50 mg Intravenous Q12H SCH          sodium chloride 200 mL/hr Continuous PRN   acetaminophen 650 mg Q4H PRN   acetaminophen 650 mg Q4H PRN   ALPRAZolam 0.25 mg QHS PRN   alteplase 2 mg Once PRN   alum & mag hydroxide-simethicone 30 mL Q4H PRN   bisacodyl 5 mg QD PRN   diphenhydrAMINE 50 mg PRN   diphenhydrAMINE-zinc acetate  TID PRN   EPINEPHrine 0.3 mg PRN   EPINEPHrine 0.1 mg PRN   hydrocortisone 100 mg PRN   lactulose 20 g BID PRN   lidocaine  PRN   loperamide 2 mg PRN   LORazepam 1 mg Q4H PRN   Or     LORazepam 1 mg Q4H PRN   Magic Mouthwash 30 mL Q4H PRN   morphine 2 mg Q2H PRN   naloxone 0.2 mg PRN   simethicone 80 mg Q6H PRN   sodium phosphate 1 enema QD PRN        Physical Exam:     Filed Vitals:    07/26/12  0745   BP: 117/69   Pulse: 78   Temp: 98.3 F (36.8 C)   Resp: 18   SpO2: 96%       Intake and Output Summary (Last 24 hours) at Date Time    Intake/Output Summary (Last 24 hours) at 07/26/12 1331  Last data filed at 07/25/12 1845   Gross per 24 hour   Intake    700 ml   Output      0 ml   Net    700 ml       Physical Exam   Constitutional: She is oriented to person, place, and time. She appears well-developed.   HENT: No oral lesions seen  Head: Normocephalic and atraumatic.   Eyes: Pupils are equal, round, and reactive to light.   Neck: No JVD present. No tracheal deviation present. No thyromegaly present.   Cardiovascular: Normal rate and normal heart sounds.  Pulmonary/Chest: No respiratory distress. She has no wheezes. She has no rales.   Abdominal: She exhibits no distension. There is no tenderness. There is no rebound and no guarding.   Musculoskeletal: She exhibits no edema.   Neurological: She is alert and oriented to person, place, and time.           Labs:     Results     Procedure Component Value Units Date/Time    Manual Differential [[id number]]  (Abnormal) Collected:07/26/12 0632     Segmented Neutrophils 51 % Updated:07/26/12 0825     Band Neutrophils 21 %      Lymphocytes Manual 8 %      Monocytes Manual 16 %      Eosinophils Manual 0 %      Basophils Manual 0 %      Myelocytes 4 %      Nucleated RBC 0      Abs Seg Manual 5.15      Bands Absolute 2.12 (H)      Absolute Lymph Manual 0.81      Monocytes Absolute 1.61 (H)      Absolute Eos Manual 0.00      Absolute Baso Manual 0.00      Absolute Myelocyte 0.40 (H)     Cell MorpHology [[id number]] Collected:07/26/12 7829     Cell Morphology: Normal Updated:07/26/12 0825    CBC and differential [[id number]]  (Abnormal) Collected:07/26/12 0632    Specimen Information:Blood / Blood Updated:07/26/12 0824     WBC 10.09      RBC 2.61 (L)      Hgb 8.2 (L) g/dL      Hematocrit 56.2 (L) %      MCV 92.0 fL      MCH 31.4 pg      MCHC 34.2 g/dL      RDW 14 %       Platelets 50 (L)      MPV 11.2 fL     Comprehensive metabolic panel [[id number]]  (Abnormal) Collected:07/26/12 0633    Specimen Information:Blood Updated:07/26/12 0804     Glucose 105 (H) mg/dL      BUN 69.6 mg/dL      Creatinine 0.6 mg/dL      Sodium 295      Potassium 3.9      Chloride 106      CO2 26      CALCIUM 8.7 mg/dL      Protein, Total 4.5 (L) g/dL      Albumin 2.3 (L) g/dL      AST (SGOT) 18 U/L      ALT 37 U/L      Alkaline Phosphatase 65 U/L      Bilirubin, Total 0.3 mg/dL      Globulin 2.2 g/dL      Albumin/Globulin Ratio 1.0      Anion Gap 8.0     HEMOLYZED INDEX [[id number]] Collected:07/26/12 0633     Hemolyzed Index 2 Updated:07/26/12 0804    GFR [[id number]] Collected:07/26/12 0633     EGFR >60.0 Updated:07/26/12 0804    Culture Blood, Aerobic [[id number]] Collected:07/21/12 2247    Specimen Information:Blood / Blood, Intravenous Line Updated:07/26/12 0221    Narrative:    ORDER#: [id number]                                    ORDERED BY: DUNNING, DAVID  SOURCE: Blood, Intravenous Line  right chest mediport COLLECTED:  07/21/12 22:47  ANTIBIOTICS AT COLL.:                                RECEIVED :  07/22/12 01:39  Culture Blood                              PRELIM      07/26/12 02:21  07/23/12   No Growth after 1 day/s of incubation.  07/24/12   No Growth after 2 day/s of incubation.  07/25/12   No Growth after 3 day/s of incubation.  07/26/12   No Growth after 4 day/s of incubation.      Culture Blood, Aerobic [[id number]] Collected:07/21/12 2247    Specimen Information:Blood / Blood, Venipuncture Updated:07/26/12 0221    Narrative:    ORDER#: [id number]                                    ORDERED BY: DUNNING, DAVID  SOURCE: Blood, Venipuncture right hand               COLLECTED:  07/21/12 22:47  ANTIBIOTICS AT COLL.:                                RECEIVED :  07/22/12 01:39  Culture Blood                              PRELIM      07/26/12 02:21  07/23/12   No Growth after 1 day/s of incubation.  07/24/12    No Growth after 2 day/s of incubation.  07/25/12   No Growth after 3 day/s of incubation.  07/26/12   No Growth after 4 day/s of incubation.      Blood culture [[id number]] Collected:07/22/12 1555    Specimen Information:Blood / Blood, Venipuncture Updated:07/25/12 2015    Narrative:    ORDER#: [id number]                                    ORDERED BY: AKSENTIJEVICH,  SOURCE: Blood, Venipuncture in the arm               COLLECTED:  07/22/12 15:55  ANTIBIOTICS AT COLL.:                                RECEIVED :  07/22/12 19:43  Culture Blood                              PRELIM      07/25/12 20:15  07/23/12   No Growth after 1 day/s of incubation.  07/24/12   No Growth after 2 day/s of incubation.  07/25/12   No Growth after 3 day/s of incubation.      Blood culture [[id number]] Collected:07/22/12 1555    Specimen Information:Blood / Blood, Intravenous Line Updated:07/25/12 2015    Narrative:    ORDER#: [id number]  ORDERED BY: AKSENTIJEVICH,  SOURCE: Blood, Intravenous Line right chest          COLLECTED:  07/22/12 15:55  ANTIBIOTICS AT COLL.:                                RECEIVED :  07/22/12 19:43  Culture Blood                              PRELIM      07/25/12 20:15  07/23/12   No Growth after 1 day/s of incubation.  07/24/12   No Growth after 2 day/s of incubation.  07/25/12   No Growth after 3 day/s of incubation.              Rads:     Xr Chest 2 Views    06/26/2012   CLINICAL INDICATION: pneumonia  COMPARISON: None available  FINDINGS:   2 views of the chest were obtained. The lungs are clear. The heart and vascularity are within normal limits.  There is no pleural thickening or effusion. The osseous structures are unremarkable.      06/26/2012   No active cardiopulmonary disease   Heron Nay, MD  06/26/2012 11:12 PM     Chest 2 Views    06/25/2012  CLINICAL INDICATION: Fever  COMPARISON: None.  INTERPRETATION: Frontal and lateral views of the chest obtained. Cardiomediastinal  contour within normal limits for age. Clear lungs and sharp sulci.           06/25/2012   No acute cardiopulmonary process.  Filbert Schilder, MD  06/25/2012 10:43 PM         Signed by: Eric Form, MD  Pager: [phone]

## 2012-07-27 ENCOUNTER — Inpatient Hospital Stay: Payer: 59

## 2012-07-27 LAB — COMPREHENSIVE METABOLIC PANEL
ALT: 36 U/L (ref 0–55)
AST (SGOT): 26 U/L (ref 5–34)
Albumin/Globulin Ratio: 1 (ref 0.9–2.2)
Albumin: 2.2 g/dL — ABNORMAL LOW (ref 3.5–5.0)
Alkaline Phosphatase: 94 U/L (ref 40–150)
Anion Gap: 7 (ref 5.0–15.0)
BUN: 20 mg/dL — ABNORMAL HIGH (ref 7.0–19.0)
Bilirubin, Total: 0.3 mg/dL (ref 0.2–1.2)
CO2: 28 (ref 22–29)
Calcium: 8.8 mg/dL (ref 8.5–10.5)
Chloride: 106 (ref 98–107)
Creatinine: 0.6 mg/dL (ref 0.6–1.0)
Globulin: 2.2 g/dL (ref 2.0–3.6)
Glucose: 84 mg/dL (ref 70–100)
Potassium: 3.8 (ref 3.5–5.1)
Protein, Total: 4.4 g/dL — ABNORMAL LOW (ref 6.0–8.3)
Sodium: 141 (ref 136–145)

## 2012-07-27 LAB — MAN DIFF ONLY
Band Neutrophils Absolute: 2.12 — ABNORMAL HIGH (ref 0.00–1.00)
Band Neutrophils: 6 %
Basophils Absolute Manual: 0 (ref 0.00–0.20)
Basophils Manual: 0 %
Eosinophils Absolute Manual: 0 (ref 0.00–0.70)
Eosinophils Manual: 0 %
Lymphocytes Absolute Manual: 1.77 (ref 0.50–4.40)
Lymphocytes Manual: 5 %
Metamyelocytes Absolute: 2.47 — ABNORMAL HIGH
Metamyelocytes: 7 %
Monocytes Absolute: 2.12 — ABNORMAL HIGH (ref 0.00–1.20)
Monocytes Manual: 6 %
Myelocytes Absolute: 3.18 — ABNORMAL HIGH
Myelocytes: 9 %
Neutrophils Absolute Manual: 23.68 — ABNORMAL HIGH (ref 1.80–8.10)
Nucleated RBC: 0 (ref 0–1)
Segmented Neutrophils: 67 %

## 2012-07-27 LAB — CBC AND DIFFERENTIAL
Hematocrit: 23.4 % — ABNORMAL LOW (ref 37.0–47.0)
Hgb: 8 g/dL — ABNORMAL LOW (ref 12.0–16.0)
MCH: 31.7 pg (ref 28.0–32.0)
MCHC: 34.2 g/dL (ref 32.0–36.0)
MCV: 92.9 fL (ref 80.0–100.0)
MPV: 11.1 fL (ref 9.4–12.3)
Platelets: 74 — ABNORMAL LOW (ref 140–400)
RBC: 2.52 — ABNORMAL LOW (ref 4.20–5.40)
RDW: 14 % (ref 12–15)
WBC: 35.34 — ABNORMAL HIGH (ref 3.50–10.80)

## 2012-07-27 LAB — CELL MORPHOLOGY: Cell Morphology: NORMAL

## 2012-07-27 LAB — GFR: EGFR: >60

## 2012-07-27 LAB — HEMOLYSIS INDEX: Hemolysis Index: 7 (ref 0–18)

## 2012-07-27 MED ORDER — GADOBUTROL 1 MMOL/ML IV SOLN
5.0000 mL | Freq: Once | INTRAVENOUS | Status: AC | PRN
Start: 2012-07-27 — End: 2012-07-27
  Administered 2012-07-27: 5 mmol via INTRAVENOUS

## 2012-07-27 NOTE — Progress Notes (Signed)
HEMATOLOGY ONCOLOGY PROGRESS NOTE    Date Time: 07/27/2012 9:41 AM  Patient Name: [name]      IMPRESSION:     Patient Active Problem List   Diagnosis   . Chondromalacia of patella   . Neutropenic fever         PLAN:  tbf-filgastrim discontinued.  Would defer to ID with regard to discontinuation of antibiotics.  Will obtain an MRI of head.  If any abnormalities will perform LP with sample for flow and path review.  Will discuss timing of possible repeat bone marrow and decision regarding type and timing of next therapy.             HISTORY: [name] is a 57 year old who present with fevers, arthralgias, myalgias, and some rhinorrhea. A CBC at the time of admission revealed a WBC of 0.84, hemoglobin 11.4, hematocrit 34.2 and a platelet count of 107. Her absolute neutrophil count was 0.39. A bone marrow showed AML.  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    Interval history:  Afebrile.  Neutropenia has resolved and platelets are improving.  She continues to have some epigastric discomfort which is partially relieved by maalox.  Also complains of her head "not being right".  She continues to have difficulty with balance and lightheadedness.       PHYSICAL EXAM:     Filed Vitals:    07/27/12 0646   BP: 127/59   Pulse: 75   Temp: 98.9 F (37.2 C)   Resp: 18   SpO2: 98%       Intake and Output Summary (Last 24 hours) at Date Time    Intake/Output Summary (Last 24 hours) at 07/27/12 0941  Last data filed at 07/27/12 0500   Gross per 24 hour   Intake    950 ml   Output      0 ml   Net    950 ml       Appearance: in no acute distress  HEENT: pharynx clear  Lungs: clear to ausculation  Heart: regular rate and rhythm, normal heart sounds  Abdomen: flat and soft, active bowel sounds  Extremities: no edema  Neuro: alert, appropriate  MEDS:   Scheduled Meds:  Current Facility-Administered  Medications   Medication Dose Route Frequency   . acyclovir  400 mg Oral Q12H SCH   . docusate sodium  100 mg Oral Daily   . fluconazole  100 mg Intravenous Q24H SCH   . hydrocortisone   Topical BID   . lactobacillus/streoptococcus  1 capsule Oral Daily   . levofloxacin  500 mg Intravenous Q24H   . pantoprazole  40 mg Oral QAM AC   . polyethylene glycol  17 g Oral Daily   . tigecycline  50 mg Intravenous Q12H SCH   . [DISCONTINUED] methylprednisolone  80 mg Intravenous Q12H   . [DISCONTINUED] tbo-filgrastim  300 mcg Subcutaneous Daily     Continuous Infusions:     . sodium chloride 50 mL/hr at 07/25/12 1827   . sodium chloride       PRN Meds:.sodium chloride, acetaminophen, acetaminophen, ALPRAZolam, alteplase, alum & mag hydroxide-simethicone, bisacodyl, diphenhydrAMINE, diphenhydrAMINE-zinc acetate, EPINEPHrine, EPINEPHrine, hydrocortisone, lactulose, lidocaine, loperamide, LORazepam, LORazepam, Magic Mouthwash, morphine, naloxone, simethicone, sodium phosphate    LABS:     Results     Procedure Component Value Units Date/Time    Cell  MorpHology [[id number]]  (Abnormal) Collected:07/27/12 0557     Cell Morphology: Normal Updated:07/27/12 0752     Toxic Granulation =1+ (A)     Manual Differential [[id number]]  (Abnormal) Collected:07/27/12 0557     Segmented Neutrophils 67 % Updated:07/27/12 0752     Band Neutrophils 6 %      Lymphocytes Manual 5 %      Monocytes Manual 6 %      Eosinophils Manual 0 %      Basophils Manual 0 %      Metamyelocytes 7 %      Myelocytes 9 %      Nucleated RBC 0      Abs Seg Manual 23.68 (H)      Bands Absolute 2.12 (H)      Absolute Lymph Manual 1.77      Monocytes Absolute 2.12 (H)      Absolute Eos Manual 0.00      Absolute Baso Manual 0.00      Metamyelocytes Absolute 2.47 (H)      Absolute Myelocyte 3.18 (H)     CBC and differential [[id number]]  (Abnormal) Collected:07/27/12 0557    Specimen Information:Blood / Blood Updated:07/27/12 0752     WBC 35.34 (H)      RBC 2.52 (L)      Hgb  8.0 (L) g/dL      Hematocrit 96.2 (L) %      MCV 92.9 fL      MCH 31.7 pg      MCHC 34.2 g/dL      RDW 14 %      Platelets 74 (L)      MPV 11.1 fL     Comprehensive metabolic panel [[id number]]  (Abnormal) Collected:07/27/12 0557    Specimen Information:Blood Updated:07/27/12 0705     Glucose 84 mg/dL      BUN 40.1 (H) mg/dL      Creatinine 0.6 mg/dL      Sodium 027      Potassium 3.8      Chloride 106      CO2 28      CALCIUM 8.8 mg/dL      Protein, Total 4.4 (L) g/dL      Albumin 2.2 (L) g/dL      AST (SGOT) 26 U/L      ALT 36 U/L      Alkaline Phosphatase 94 U/L      Bilirubin, Total 0.3 mg/dL      Globulin 2.2 g/dL      Albumin/Globulin Ratio 1.0      Anion Gap 7.0     HEMOLYZED INDEX [[id number]] Collected:07/27/12 0557     Hemolyzed Index 7 Updated:07/27/12 0705    GFR [[id number]] Collected:07/27/12 0557     EGFR >60.0 Updated:07/27/12 0705    Culture Blood, Aerobic [[id number]] Collected:07/21/12 2247    Specimen Information:Blood / Blood, Venipuncture Updated:07/27/12 0421    Narrative:    ORDER#: [id number]                                    ORDERED BY: Breeley Bischof  SOURCE: Blood, Venipuncture right hand               COLLECTED:  07/21/12 22:47  ANTIBIOTICS AT COLL.:  RECEIVED :  07/22/12 01:39  Culture Blood                              FINAL       07/27/12 04:21  07/27/12   No growth after 5 days of incubation.      Culture Blood, Aerobic [[id number]] Collected:07/21/12 2247    Specimen Information:Blood / Blood, Intravenous Line Updated:07/27/12 0421    Narrative:    ORDER#: [id number]                                    ORDERED BY: Avril Busser  SOURCE: Blood, Intravenous Line right chest mediport COLLECTED:  07/21/12 22:47  ANTIBIOTICS AT COLL.:                                RECEIVED :  07/22/12 01:39  Culture Blood                              FINAL       07/27/12 04:21  07/27/12   No growth after 5 days of incubation.      Packed Red Blood Cells - Product [[id number]]  Collected:07/23/12 1710     RBC Leukoreduced Irradiated [id number]    transfused Updated:07/27/12 0057    Blood culture [[id number]] Collected:07/22/12 1555    Specimen Information:Blood / Blood, Venipuncture Updated:07/26/12 2015    Narrative:    ORDER#: [id number]                                    ORDERED BY: AKSENTIJEVICH,  SOURCE: Blood, Venipuncture in the arm               COLLECTED:  07/22/12 15:55  ANTIBIOTICS AT COLL.:                                RECEIVED :  07/22/12 19:43  Culture Blood                              PRELIM      07/26/12 20:15  07/23/12   No Growth after 1 day/s of incubation.  07/24/12   No Growth after 2 day/s of incubation.  07/25/12   No Growth after 3 day/s of incubation.  07/26/12   No Growth after 4 day/s of incubation.      Blood culture [[id number]] Collected:07/22/12 1555    Specimen Information:Blood / Blood, Intravenous Line Updated:07/26/12 2015    Narrative:    ORDER#: [id number]                                    ORDERED BY: AKSENTIJEVICH,  SOURCE: Blood, Intravenous Line right chest          COLLECTED:  07/22/12 15:55  ANTIBIOTICS AT COLL.:                                RECEIVED :  07/22/12 19:43  Culture Blood                              PRELIM      07/26/12 20:15  07/23/12   No Growth after 1 day/s of incubation.  07/24/12   No Growth after 2 day/s of incubation.  07/25/12   No Growth after 3 day/s of incubation.  07/26/12   No Growth after 4 day/s of incubation.            IMAGING DATA:  Ct Sinus Facial Bones Wo Contrast    06/28/2012  HISTORY: Sinusitis  COMPARISON: None.  TECHNIQUE: Axial CT scans slices of the paranasal sinuses and facial bones with sagittal and coronal reformats  FINDINGS: No fracture or other bony abnormality. The sinuses are clear. The ostiomeatal complexes are patent. There is no mass or nasopharynx. There is a left concha bullosa. The nasal septum is midline.      06/28/2012   Normal  Lynnae Prude, MD  06/28/2012 2:42 PM     Ct Chest Wo  Contrast    06/28/2012  HISTORY: Cough  COMPARISON: None  TECHNIQUE:  Helical CT scan of the chest was obtained from the apices to the lung bases without intravenous contrast.      FINDINGS:  Lungs and central airways: Atelectasis or scarring in the lower lobes. Pleura: Trace effusions bilaterally. Aorta and Great Vessels: Within normal limits. Heart: No pericardial effusion. No coronary artery calcification. Mediastinum and hila: No mass or adenopathy Upper Abdomen: No significant abnormality. Bones: No significant abnormality. Chest wall and lower neck: Normal       06/28/2012   Atelectasis or scarring in the lower lobes. Trace bilateral pleural effusions.  Lynnae Prude, MD  06/28/2012 2:47 PM     Nm Cardiac Muga (at Rest)    07/02/2012  CLINICAL INDICATION: Prechemotherapy evaluation. Leukemia.  TECHNIQUE: A dynamic MUGA scan was obtained. Imaging obtained in the LAO, lateral, and anterior projections. The left ventricle region of interest was drawn, on the best separation view, in end-diastole and end-systole, and ejection fraction was calculated. 23.4 mCi of Tc67m labeled autologous RBCs, using the Ultratag technique was administered.  FINDINGS: Normal wall motion is seen in the left ventricle. The ejection fraction is calculated at 79%, which is normal.      07/02/2012   NORMAL EJECTION FRACTION, OF 79%.  Darnelle Maffucci, MD  07/02/2012 2:23 PM     Xr Chest Ap Portable    07/22/2012  CLINICAL INDICATION: upright indication fever  COMPARISON: 07/13/2012  FINDINGS:   A single portable AP view of the chest was obtained. Central line remains positioned in the SVC. The lungs are clear. The heart and vascularity are within normal limits. The lateral costophrenic angles are sharp.       07/22/2012    NO ACUTE PROCESS.   Heron Nay, MD  07/22/2012 7:50 AM     Xr Chest Ap Portable    07/13/2012  History: shortness of breath  Technique: Single Portable View  Comparison: June 13  Findings: There are small bilateral pleural effusions  with trace bibasilar atelectasis. PICC line tip in the SVC. There is no pneumothorax. The heart is normal in size.    The mediastinum is within normal limits.           07/13/2012   Trace bilateral pleural effusions,   Laurena Slimmer, MD  07/13/2012  9:48 AM     Biopsy, Bone Marrow    06/29/2012  EXAMINATION: Fluoroscopic guided bone marrow aspirate and core biopsy.  INTERVENTIONALIST: Verlee Rossetti, MD.  HISTORY:  Patient is a 57 year old female with pancytopenia and neutropenic fever.  Anesthesia: Moderate sedation with Fentanyl and Versed was administered with appropriate physiologic monitoring using an independent trained observer for 30 minutes. 1% lidocaine local was administered as well.  TECHNIQUE: With the patient in the prone position the skin over the left iliac crest was prepped and draped in usual sterile fashion. Local anesthesia was applied to superficial and deep tissues with 2% lidocaine. Subsequently, under direct fluoroscopic guidance, a bone marrow needle was advanced to the posteromedial aspect of the superior left iliac crest. The outer cortex was traversed and bone marrow aspiration performed and the needle removed.    The bone marrow core needle was then advanced to a separate site on the iliac crest to obtain a core of bone marrow tissue. The samples were given to hematology for analysis. The patient tolerated procedure well without apparent competition.  INTERPRETATION: Fluoroscopy revealed successful access to the left iliac crest marrow cavity for bone marrow aspiration and for core biopsy.  Fluoroscopy Time: 0.3 min      06/29/2012   Technically successful fluoroscopic guided bone marrow aspirate and core biopsy for pancytopenia.  Larrie Kass, MD  06/29/2012 4:21 PM     Tunneled Cath Placement (permcath)    07/03/2012   Clinical history: Power line  Interventionalist: Dara Lords, MD  Informed consent obtained and all questions answered.  Anesthesia:  Moderate sedation with appropriate  monitoring plus local. This consisted of Versed and fentanyl administration with continuous hemodynamic monitoring with a trained observer. Total anesthesia time with direct physician supervision 20 minutes.  Procedure: After standard prepping and draping with maximal sterile barrier technique and local anesthesia with 2% Xylocaine, percutaneous puncture of the right internal jugular vein is performed under ultrasound guidance with [place] image obtained for the medical record. The guidewire is advanced under fluoroscopic guidance. Dilator is advanced over the wire and left in place and flushed.  A double-lumen power line cuffed catheter is tunneled from the venotomy site to an exit site on the right anterior chest wall after prior infiltration of the right anterior chest wall with Xylocaine plus epinephrine.  The dilator at the venotomy site is exchanged for a peel-away sheath. The double-lumen power line (equivalent to Groshong) catheter is advanced through the peel-away sheath. The peel-away sheath was removed. The catheter length is adjusted to position the catheter tip in the superior vena cava.  The catheter secured in place with 2-0 Prolene suture. The venotomy site was closed with  Dermabond.  Complications: None apparent.  Fluoroscopy time:0.3 minutes  Findings: The right internal jugular vein is patent by US imaging. An [place] image is obtained for the medical record. Catheter tip is in the superior vena cava.      07/03/2012    successful ultrasound and fluoroscopically guided placement of a right internal jugular dual-lumen cuffed power line catheter. The catheter may be used immediately.  Dara Lords, MD  07/03/2012 2:42 PM                       Larene Pickett, MD  [facility]    [phone]

## 2012-07-27 NOTE — Progress Notes (Signed)
MRI of head revealed a few microvascular changes only.  Discussed current clinical findings with Dr. Ambrose [place] and unless there are new developments, would recommend discharge in the AM.

## 2012-07-27 NOTE — Plan of Care (Signed)
Problem: Pain  Goal: Patient's pain/discomfort is manageable  Outcome: Progressing  DENIES PAIN  ON ASSESSMENT  WILLI CONTINUE TO ASSESS FOR PAIN.    Problem: Infection/Potential for Infection  Goal: Free from infection  Outcome: Progressing  NEUTROPENIC PRECAUTIONS MAINTAINED   REMAIN AFEBRILE.

## 2012-07-27 NOTE — Progress Notes (Signed)
Infectious Diseases & Tropical Medicine  Progress Note    07/27/2012   [name] JYN:[id number],VHQ:[id number] is a 57 y.o. female,       Assessment:     Acute leukemia s/p chemotherapy  Groshong catheter in place  Febrile pancytopenia / neutropenia-resolved   CXR - NAD  Liver function tests within normal limits  Generalized rash almost resolved  Blood cultures - NGTD 07/21/12  Urine cultures no growth  Clinically stable    Plan:      Discontinue tygacil   Continue Levaquin x 5 days   Taper solumedrol    Continue Diflucan and acyclovir   Continue probiotics   On G- CSF   Reviewed notes   Monitor clinically   Discharge planning once cleared by oncology    ROS:     General:  no fever, no chills, no rigor,feeling better   HEENT: no neck pain, no throat pain  Endocrine:no fatigue  Respiratory: no cough,no shortness of breath, or wheezing   Cardiovascular: no chest pain   Gastrointestinal: no abdominal pain,no N/V/D  Genito-Urinary: no dysuria, trouble voiding, or hematuria  Musculoskeletal: no edema  Neurological: c/o generalized weakness, complaining of dizziness  Dermatological:  Generalized maculopapular rash almost resolved, no ulcer    Physical Examination:     Blood pressure 127/59, pulse 75, temperature 98.9 F (37.2 C), temperature source Oral, resp. rate 18, height 1.651 m (5\' 5" ), weight 51.823 kg (114 lb 4 oz), SpO2 98.00%.     General Appearance: Comfortable, and in no acute distress. Awake and alert, feeling better   HEENT: Pupils are equal, round, and reactive to light.    Lungs:  clear   Heart: RRR   Chest: Symmetric chest wall expansion.   Abdomen: soft ,non tender,no hepatosplenomegaly   Neurological: No focal deficit   Extremities: No edema    Laboratory And Diagnostic Studies:     Recent Labs   [facility] 07/27/12 0557 07/26/12 0632    WBC 35.34* 10.09    HGB 8.0* 8.2*    HCT 23.4* 24.0*    PLT 74* 50*     Recent Labs   [facility] 07/27/12 0557 07/26/12 0633    NA 141 140    K 3.8 3.9     CL 106 106    CO2 28 26    BUN 20.0* 16.0    CREAT 0.6 0.6    GLU 84 105*    CA 8.8 8.7     Recent Labs   Basename 07/27/12 0557 07/26/12 0633    AST 26 18    ALT 36 37    ALKPHOS 94 65    PROT 4.4* 4.5*    ALB 2.2* 2.3*       Current Meds:      Scheduled Meds: PRN Meds:           acyclovir 400 mg Oral Q12H SCH   docusate sodium 100 mg Oral Daily   fluconazole 100 mg Intravenous Q24H SCH   hydrocortisone  Topical BID   lactobacillus/streoptococcus 1 capsule Oral Daily   levofloxacin 500 mg Intravenous Q24H   pantoprazole 40 mg Oral QAM AC   polyethylene glycol 17 g Oral Daily   tigecycline 50 mg Intravenous Q12H [facility]   [DISCONTINUED] methylprednisolone 80 mg Intravenous Q12H   [DISCONTINUED] tbo-filgrastim 300 mcg Subcutaneous Daily       Continuous Infusions:       . sodium chloride 50 mL/hr at 07/25/12 1827   . sodium chloride  sodium chloride 200 mL/hr Continuous PRN   acetaminophen 650 mg Q4H PRN   acetaminophen 650 mg Q4H PRN   ALPRAZolam 0.25 mg QHS PRN   alteplase 2 mg Once PRN   alum & mag hydroxide-simethicone 30 mL Q4H PRN   bisacodyl 5 mg QD PRN   diphenhydrAMINE 50 mg PRN   diphenhydrAMINE-zinc acetate  TID PRN   EPINEPHrine 0.3 mg PRN   EPINEPHrine 0.1 mg PRN   hydrocortisone 100 mg PRN   lactulose 20 g BID PRN   lidocaine  PRN   loperamide 2 mg PRN   LORazepam 1 mg Q4H PRN   Or     LORazepam 1 mg Q4H PRN   Magic Mouthwash 30 mL Q4H PRN   morphine 2 mg Q2H PRN   naloxone 0.2 mg PRN   simethicone 80 mg Q6H PRN   sodium phosphate 1 enema QD PRN         Lafe Clerk A. Janalyn Rouse, M.D.  07/27/2012  9:38 AM

## 2012-07-27 NOTE — Progress Notes (Signed)
Daily PROGRESS NOTE    Date Time: 07/27/2012 11:37 PM  Patient Name: [name], [name]  Patient Status: Inpatient  Hospital Day: 32    Assessment:   Acute Leukemia, NOS   Neutropenic fever   .Pancytopenia.   .Weight loss with a body mass index of 18 shows mild to moderate   Malnutrition  . Skin rash.  Epigastric pain  Sinus brady  BM with Residual AML, hypocellular marrow    Plan:   1. resolved Neutropenia  2. Transfuse as needed, d/c CSGF  3. D/c Neutropenic precaution per ID  4. Levaquin  5. D/C next 24-48hrs    Subjective:    Fatigue, Dizziness-light headed  10 point Review of Systems - Negative except for the Positives mentioned above      Medications:     Current Facility-Administered Medications   Medication Dose Route Frequency   . acyclovir  400 mg Oral Q12H SCH   . docusate sodium  100 mg Oral Daily   . fluconazole  100 mg Intravenous Q24H SCH   . hydrocortisone   Topical BID   . lactobacillus/streoptococcus  1 capsule Oral Daily   . levofloxacin  500 mg Intravenous Q24H   . pantoprazole  40 mg Oral QAM AC   . polyethylene glycol  17 g Oral Daily   . [DISCONTINUED] tbo-filgrastim  300 mcg Subcutaneous Daily   . [DISCONTINUED] tigecycline  50 mg Intravenous Q12H SCH          sodium chloride 200 mL/hr Continuous PRN   acetaminophen 650 mg Q4H PRN   acetaminophen 650 mg Q4H PRN   ALPRAZolam 0.25 mg QHS PRN   alteplase 2 mg Once PRN   alum & mag hydroxide-simethicone 30 mL Q4H PRN   bisacodyl 5 mg QD PRN   diphenhydrAMINE 50 mg PRN   diphenhydrAMINE-zinc acetate  TID PRN   EPINEPHrine 0.3 mg PRN   EPINEPHrine 0.1 mg PRN   [COMPLETED] gadobutrol 5 mL ONCE PRN   hydrocortisone 100 mg PRN   lactulose 20 g BID PRN   lidocaine  PRN   loperamide 2 mg PRN   LORazepam 1 mg Q4H PRN   Or     LORazepam 1 mg Q4H PRN   Magic Mouthwash 30 mL Q4H PRN   morphine 2 mg Q2H PRN   naloxone 0.2 mg PRN   simethicone 80 mg Q6H PRN   sodium phosphate 1 enema QD PRN        Physical Exam:     Filed Vitals:    07/27/12 2000   BP:  117/63   Pulse: 100   Temp: 100 F (37.8 C)   Resp: 18   SpO2: 97%       Intake and Output Summary (Last 24 hours) at Date Time    Intake/Output Summary (Last 24 hours) at 07/27/12 2337  Last data filed at 07/27/12 1700   Gross per 24 hour   Intake   1750 ml   Output      0 ml   Net   1750 ml       Physical Exam   Constitutional: She is oriented to person, place, and time. She appears well-developed.   HENT: No oral lesions seen  Head: Normocephalic and atraumatic.   Eyes: Pupils are equal, round, and reactive to light.   Neck: No JVD present. No tracheal deviation present. No thyromegaly present.   Cardiovascular: Normal rate and normal heart sounds.    Pulmonary/Chest: No respiratory distress.  She has no wheezes. She has no rales.   Abdominal: She exhibits no distension. There is no tenderness. There is no rebound and no guarding.   Musculoskeletal: She exhibits no edema.   Neurological: She is alert and oriented to person, place, and time.           Labs:     Results     Procedure Component Value Units Date/Time    Blood culture [[id number]] Collected:07/22/12 1555    Specimen Information:Blood / Blood, Venipuncture Updated:07/27/12 2215    Narrative:    ORDER#: [id number]                                    ORDERED BY: AKSENTIJEVICH,  SOURCE: Blood, Venipuncture in the arm               COLLECTED:  07/22/12 15:55  ANTIBIOTICS AT COLL.:                                RECEIVED :  07/22/12 19:43  Culture Blood                              FINAL       07/27/12 22:15  07/27/12   No growth after 5 days of incubation.      Blood culture [[id number]] Collected:07/22/12 1555    Specimen Information:Blood / Blood, Intravenous Line Updated:07/27/12 2215    Narrative:    ORDER#: [id number]                                    ORDERED BY: AKSENTIJEVICH,  SOURCE: Blood, Intravenous Line right chest          COLLECTED:  07/22/12 15:55  ANTIBIOTICS AT COLL.:                                RECEIVED :  07/22/12 19:43  Culture Blood                               FINAL       07/27/12 22:15  07/27/12   No growth after 5 days of incubation.      Cell MorpHology [[id number]]  (Abnormal) Collected:07/27/12 0557     Cell Morphology: Normal Updated:07/27/12 0752     Toxic Granulation =1+ (A)     Manual Differential [[id number]]  (Abnormal) Collected:07/27/12 0557     Segmented Neutrophils 67 % Updated:07/27/12 0752     Band Neutrophils 6 %      Lymphocytes Manual 5 %      Monocytes Manual 6 %      Eosinophils Manual 0 %      Basophils Manual 0 %      Metamyelocytes 7 %      Myelocytes 9 %      Nucleated RBC 0      Abs Seg Manual 23.68 (H)      Bands Absolute 2.12 (H)      Absolute Lymph Manual 1.77      Monocytes Absolute 2.12 (H)      Absolute Eos Manual 0.00  Absolute Baso Manual 0.00      Metamyelocytes Absolute 2.47 (H)      Absolute Myelocyte 3.18 (H)     CBC and differential [[id number]]  (Abnormal) Collected:07/27/12 0557    Specimen Information:Blood / Blood Updated:07/27/12 0752     WBC 35.34 (H)      RBC 2.52 (L)      Hgb 8.0 (L) g/dL      Hematocrit 40.9 (L) %      MCV 92.9 fL      MCH 31.7 pg      MCHC 34.2 g/dL      RDW 14 %      Platelets 74 (L)      MPV 11.1 fL     Comprehensive metabolic panel [[id number]]  (Abnormal) Collected:07/27/12 0557    Specimen Information:Blood Updated:07/27/12 0705     Glucose 84 mg/dL      BUN 95.6 (H) mg/dL      Creatinine 0.6 mg/dL      Sodium 213      Potassium 3.8      Chloride 106      CO2 28      CALCIUM 8.8 mg/dL      Protein, Total 4.4 (L) g/dL      Albumin 2.2 (L) g/dL      AST (SGOT) 26 U/L      ALT 36 U/L      Alkaline Phosphatase 94 U/L      Bilirubin, Total 0.3 mg/dL      Globulin 2.2 g/dL      Albumin/Globulin Ratio 1.0      Anion Gap 7.0     HEMOLYZED INDEX [[id number]] Collected:07/27/12 0557     Hemolyzed Index 7 Updated:07/27/12 0705    GFR [[id number]] Collected:07/27/12 0557     EGFR >60.0 Updated:07/27/12 0705    Culture Blood, Aerobic [[id number]] Collected:07/21/12 2247    Specimen  Information:Blood / Blood, Venipuncture Updated:07/27/12 0421    Narrative:    ORDER#: [id number]                                    ORDERED BY: DUNNING, DAVID  SOURCE: Blood, Venipuncture right hand               COLLECTED:  07/21/12 22:47  ANTIBIOTICS AT COLL.:                                RECEIVED :  07/22/12 01:39  Culture Blood                              FINAL       07/27/12 04:21  07/27/12   No growth after 5 days of incubation.      Culture Blood, Aerobic [[id number]] Collected:07/21/12 2247    Specimen Information:Blood / Blood, Intravenous Line Updated:07/27/12 0421    Narrative:    ORDER#: [id number]                                    ORDERED BY: DUNNING, DAVID  SOURCE: Blood, Intravenous Line right chest mediport COLLECTED:  07/21/12 22:47  ANTIBIOTICS AT COLL.:  RECEIVED :  07/22/12 01:39  Culture Blood                              FINAL       07/27/12 04:21  07/27/12   No growth after 5 days of incubation.      Packed Red Blood Cells - Product [[id number]] Collected:07/23/12 1710     RBC Leukoreduced Irradiated [id number]    transfused Updated:07/27/12 0057            Rads:     Xr Chest 2 Views    06/26/2012   CLINICAL INDICATION: pneumonia  COMPARISON: None available  FINDINGS:   2 views of the chest were obtained. The lungs are clear. The heart and vascularity are within normal limits.  There is no pleural thickening or effusion. The osseous structures are unremarkable.      06/26/2012   No active cardiopulmonary disease   Heron Nay, MD  06/26/2012 11:12 PM     Chest 2 Views    06/25/2012  CLINICAL INDICATION: Fever  COMPARISON: None.  INTERPRETATION: Frontal and lateral views of the chest obtained. Cardiomediastinal contour within normal limits for age. Clear lungs and sharp sulci.           06/25/2012   No acute cardiopulmonary process.  Filbert Schilder, MD  06/25/2012 10:43 PM         Signed by: Eric Form, MD  Pager: [phone]

## 2012-07-28 LAB — COMPREHENSIVE METABOLIC PANEL
ALT: 30 U/L (ref 0–55)
AST (SGOT): 37 U/L — ABNORMAL HIGH (ref 5–34)
Albumin/Globulin Ratio: 1 (ref 0.9–2.2)
Albumin: 2.2 g/dL — ABNORMAL LOW (ref 3.5–5.0)
Alkaline Phosphatase: 126 U/L (ref 40–150)
Anion Gap: 3 — ABNORMAL LOW (ref 5.0–15.0)
BUN: 11 mg/dL (ref 7.0–19.0)
Bilirubin, Total: 0.3 mg/dL (ref 0.2–1.2)
CO2: 32 — ABNORMAL HIGH (ref 22–29)
Calcium: 8.5 mg/dL (ref 8.5–10.5)
Chloride: 103 (ref 98–107)
Creatinine: 0.6 mg/dL (ref 0.6–1.0)
Globulin: 2.2 g/dL (ref 2.0–3.6)
Glucose: 74 mg/dL (ref 70–100)
Potassium: 3.6 (ref 3.5–5.1)
Protein, Total: 4.4 g/dL — ABNORMAL LOW (ref 6.0–8.3)
Sodium: 138 (ref 136–145)

## 2012-07-28 LAB — GFR: EGFR: >60

## 2012-07-28 LAB — MAN DIFF ONLY
Band Neutrophils Absolute: 1.55 — ABNORMAL HIGH (ref 0.00–1.00)
Band Neutrophils: 3 %
Basophils Absolute Manual: 0 (ref 0.00–0.20)
Basophils Manual: 0 %
Eosinophils Absolute Manual: 0 (ref 0.00–0.70)
Eosinophils Manual: 0 %
Lymphocytes Absolute Manual: 2.58 (ref 0.50–4.40)
Lymphocytes Manual: 5 %
Metamyelocytes Absolute: 2.06 — ABNORMAL HIGH
Metamyelocytes: 4 %
Monocytes Absolute: 2.06 — ABNORMAL HIGH (ref 0.00–1.20)
Monocytes Manual: 4 %
Myelocytes Absolute: 6.71 — ABNORMAL HIGH
Myelocytes: 13 %
Neutrophils Absolute Manual: 36.13 — ABNORMAL HIGH (ref 1.80–8.10)
Nucleated RBC: 0 (ref 0–1)
Promyelocytes Absolute: 0.52 — ABNORMAL HIGH
Promyelocytes: 1 %
Segmented Neutrophils: 70 %

## 2012-07-28 LAB — CBC AND DIFFERENTIAL
Hematocrit: 24.9 % — ABNORMAL LOW (ref 37.0–47.0)
Hgb: 8.4 g/dL — ABNORMAL LOW (ref 12.0–16.0)
MCH: 31.7 pg (ref 28.0–32.0)
MCHC: 33.7 g/dL (ref 32.0–36.0)
MCV: 94 fL (ref 80.0–100.0)
MPV: 10.4 fL (ref 9.4–12.3)
Platelets: 84 — ABNORMAL LOW (ref 140–400)
RBC: 2.65 — ABNORMAL LOW (ref 4.20–5.40)
RDW: 14 % (ref 12–15)
WBC: 51.61 — ABNORMAL HIGH (ref 3.50–10.80)

## 2012-07-28 LAB — CELL MORPHOLOGY: Cell Morphology: NORMAL

## 2012-07-28 LAB — HEMOLYSIS INDEX: Hemolysis Index: 20 — ABNORMAL HIGH (ref 0–18)

## 2012-07-28 MED ORDER — HEPARIN SOD (PORK) LOCK FLUSH 100 UNIT/ML IV SOLN
3.00 mL | Freq: Once | INTRAVENOUS | Status: DC
Start: 2012-07-28 — End: 2012-07-28
  Filled 2012-07-28: qty 5

## 2012-07-28 MED ORDER — LEVOFLOXACIN 500 MG PO TABS
500.0000 mg | ORAL_TABLET | Freq: Every day | ORAL | Status: AC
Start: 2012-07-28 — End: 2012-08-02

## 2012-07-28 MED ORDER — FLUCONAZOLE 100 MG PO TABS
100.0000 mg | ORAL_TABLET | Freq: Every day | ORAL | Status: AC
Start: 2012-07-28 — End: 2012-08-02

## 2012-07-28 MED ORDER — ALPRAZOLAM 0.25 MG PO TABS
0.25 mg | ORAL_TABLET | Freq: Every evening | ORAL | Status: DC | PRN
Start: 2012-07-28 — End: 2012-08-09

## 2012-07-28 NOTE — Plan of Care (Signed)
Patient discharged home with friend to friends house. Her vitals are stable. Prescriptions given to patient. Verbalizes understanding of diacharge instructions. Provided address and phone number of Dr. Adela Lank and Dr. Ambrose [place]. She is to call office tomorrow to find out what time her appointment is on this Friday. Patient is being discharged with her double lumen chest line. It was flushed and dressing was changed. Denies any discomfort at this time.

## 2012-07-28 NOTE — Plan of Care (Signed)
Patient discharged home with friends at 11.

## 2012-07-28 NOTE — Progress Notes (Signed)
Infectious Diseases [facility]  Progress Note    07/28/2012   [name] LKG:[id number],YQI:[id number] is a 57 y.o. female,       Assessment:     Acute leukemia s/p chemotherapy  Groshong catheter in place  Febrile pancytopenia / neutropenia-resolved   CXR - NAD  Liver function tests within normal limits  Generalized rash almost resolved  Blood cultures - NGTD 07/21/12  Urine cultures no growth  MRI scan of the brain unremarkable  Clinically stable    Plan:      OK to discharge from infectious disease point of view   Continue Levaquin x 5 days   Diflucan for 5 days   Prescription in the chart    ROS:     General:  Low grade fever, no chills, no rigor,feeling better   HEENT: no neck pain, no throat pain  Endocrine:no fatigue  Respiratory: no cough,no shortness of breath, or wheezing   Cardiovascular: no chest pain   Gastrointestinal: no abdominal pain,no N/V/D  Genito-Urinary: no dysuria, trouble voiding, or hematuria  Musculoskeletal: no edema  Neurological: c/o generalized weakness  Dermatological:  Generalized maculopapular rash almost resolved, no ulcer    Physical Examination:     Blood pressure 125/65, pulse 80, temperature 99.4 F (37.4 C), temperature source Oral, resp. rate 18, height 1.651 m (5\' 5" ), weight 51.823 kg (114 lb 4 oz), SpO2 97.00%.     General Appearance: Comfortable, and in no acute distress. Awake and alert, feeling better   HEENT: Pupils are equal, round, and reactive to light.    Lungs:  clear   Heart: RRR   Chest: Symmetric chest wall expansion.   Abdomen: soft ,non tender,no hepatosplenomegaly   Neurological: No focal deficit   Extremities: No edema    Laboratory And Diagnostic Studies:     Recent Labs   [facility] 07/28/12 0557 07/27/12 0557    WBC 51.61* 35.34*    HGB 8.4* 8.0*    HCT 24.9* 23.4*    PLT 84* 74*     Recent Labs   Basename 07/28/12 0557 07/27/12 0557    NA 138 141    K 3.6 3.8    CL 103 106    CO2 32* 28    BUN 11.0 20.0*    CREAT 0.6 0.6    GLU 74  84    CA 8.5 8.8     Recent Labs   Basename 07/28/12 0557 07/27/12 0557    AST 37* 26    ALT 30 36    ALKPHOS 126 94    PROT 4.4* 4.4*    ALB 2.2* 2.2*       Current Meds:      Scheduled Meds: PRN Meds:           acyclovir 400 mg Oral Q12H SCH   docusate sodium 100 mg Oral Daily   fluconazole 100 mg Intravenous Q24H SCH   hydrocortisone  Topical BID   lactobacillus/streoptococcus 1 capsule Oral Daily   levofloxacin 500 mg Intravenous Q24H   pantoprazole 40 mg Oral QAM AC   polyethylene glycol 17 g Oral Daily   [DISCONTINUED] tigecycline 50 mg Intravenous Q12H SCH       Continuous Infusions:       . sodium chloride 50 mL/hr at 07/27/12 1021   . sodium chloride           sodium chloride 200 mL/hr Continuous PRN   acetaminophen 650 mg Q4H PRN   acetaminophen 650  mg Q4H PRN   ALPRAZolam 0.25 mg QHS PRN   alteplase 2 mg Once PRN   alum & mag hydroxide-simethicone 30 mL Q4H PRN   bisacodyl 5 mg QD PRN   diphenhydrAMINE 50 mg PRN   diphenhydrAMINE-zinc acetate  TID PRN   EPINEPHrine 0.3 mg PRN   EPINEPHrine 0.1 mg PRN   [COMPLETED] gadobutrol 5 mL ONCE PRN   hydrocortisone 100 mg PRN   lactulose 20 g BID PRN   lidocaine  PRN   loperamide 2 mg PRN   LORazepam 1 mg Q4H PRN   Or     LORazepam 1 mg Q4H PRN   Magic Mouthwash 30 mL Q4H PRN   morphine 2 mg Q2H PRN   naloxone 0.2 mg PRN   simethicone 80 mg Q6H PRN   sodium phosphate 1 enema QD PRN         Emlyn Maves A. Janalyn Rouse, M.D.  07/28/2012  9:08 AM

## 2012-07-28 NOTE — Progress Notes (Signed)
HEMATOLOGY ONCOLOGY PROGRESS NOTE    Date Time: 07/28/2012 9:10 AM  Patient Name: [name],[name]      IMPRESSION:     Patient Active Problem List   Diagnosis   . Chondromalacia of patella   . Neutropenic fever         PLAN: Would recommend discontinuing antibiotics, antivirals and antifungal agents if ID in agreement and discharging patient.  She will need instruction in the care of her catheter.  It can be flushed at the office.  Follow up needs to be scheduled with Dr. Ambrose Finland later this week.  Her current leucocytosis reflects recent tbf-filgastrim.  There are no blasts and the improvement in red cells and platelets is most consistent with a recovering marrow.  In view of her concerns regarding the mild temperature elevation, I have requested that routine cultures be drawn from her catheter lumens prior to discharge.             HISTORY: [name] is a 57 year old who present with fevers, arthralgias, myalgias, and some rhinorrhea. A CBC at the time of admission revealed a WBC of 0.84, hemoglobin 11.4, hematocrit 34.2 and a platelet count of 107. Her absolute neutrophil count was 0.39. A bone marrow showed AML.   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   Interval History:  She feels well.  She experienced a temperature elevation to 100 last PM without symptoms.  Lightheadedness has resolved.         PHYSICAL EXAM:     Filed Vitals:    07/28/12 0659   BP: 125/65   Pulse: 80   Temp: 99.4 F (37.4 C)   Resp: 18   SpO2: 97%       Intake and Output Summary (Last 24 hours) at Date Time    Intake/Output Summary (Last 24 hours) at 07/28/12 0910  Last data filed at 07/28/12 0600   Gross per 24 hour   Intake   1700 ml   Output      0 ml   Net   1700 ml       Appearance: in no acute distress  HEENT: pharynx clear  Lungs: clear to ausculation  Heart: regular rate and rhythm, normal heart  sounds  Chest wall:  Double lumen catheter in place.  No tenderness noted.  Abdomen: flat and soft, active bowel sounds  Extremities: no edema  Neuro: alert, appropriate  MEDS:   Scheduled Meds:  Current Facility-Administered Medications   Medication Dose Route Frequency   . acyclovir  400 mg Oral Q12H SCH   . docusate sodium  100 mg Oral Daily   . fluconazole  100 mg Intravenous Q24H SCH   . hydrocortisone   Topical BID   . lactobacillus/streoptococcus  1 capsule Oral Daily   . levofloxacin  500 mg Intravenous Q24H   . pantoprazole  40 mg Oral QAM AC   . polyethylene glycol  17 g Oral Daily   . [DISCONTINUED] tigecycline  50 mg Intravenous Q12H [facility]     Continuous Infusions:     . sodium chloride 50 mL/hr at 07/27/12 1021   . sodium chloride       PRN Meds:.sodium chloride, acetaminophen, acetaminophen, ALPRAZolam, alteplase, alum & mag hydroxide-simethicone, bisacodyl, diphenhydrAMINE, diphenhydrAMINE-zinc acetate, EPINEPHrine, EPINEPHrine, [COMPLETED] gadobutrol, hydrocortisone, lactulose, lidocaine, loperamide, LORazepam, LORazepam, Magic Mouthwash, morphine, naloxone, simethicone, sodium phosphate  LABS:     Results     Procedure Component Value Units Date/Time    CBC and differential [[id number]]  (Abnormal) Collected:07/28/12 0557    Specimen Information:Blood / Blood Updated:07/28/12 0801     WBC 51.61 (H)      RBC 2.65 (L)      Hgb 8.4 (L) g/dL      Hematocrit 40.9 (L) %      MCV 94.0 fL      MCH 31.7 pg      MCHC 33.7 g/dL      RDW 14 %      Platelets 84 (L)      MPV 10.4 fL     Manual Differential [[id number]]  (Abnormal) Collected:07/28/12 0557     Segmented Neutrophils 70 % Updated:07/28/12 0801     Band Neutrophils 3 %      Lymphocytes Manual 5 %      Monocytes Manual 4 %      Eosinophils Manual 0 %      Basophils Manual 0 %      Metamyelocytes 4 %      Myelocytes 13 %      Promyelocyte 1 %      Nucleated RBC 0      Abs Seg Manual 36.13 (H)      Bands Absolute 1.55 (H)      Absolute Lymph Manual 2.58       Monocytes Absolute 2.06 (H)      Absolute Eos Manual 0.00      Absolute Baso Manual 0.00      Metamyelocytes Absolute 2.06 (H)      Absolute Myelocyte 6.71 (H)      Promyelocytes Absolute 0.52 (H)     Cell MorpHology [[id number]]  (Abnormal) Collected:07/28/12 0557     Cell Morphology: Normal Updated:07/28/12 0801     Toxic Granulation =1+ (A)     Comprehensive metabolic panel [[id number]]  (Abnormal) Collected:07/28/12 0557    Specimen Information:Blood Updated:07/28/12 0717     Glucose 74 mg/dL      BUN 52.8 mg/dL      Creatinine 0.6 mg/dL      Sodium 413      Potassium 3.6      Chloride 103      CO2 32 (H)      CALCIUM 8.5 mg/dL      Protein, Total 4.4 (L) g/dL      Albumin 2.2 (L) g/dL      AST (SGOT) 37 (H) U/L      ALT 30 U/L      Alkaline Phosphatase 126 U/L      Bilirubin, Total 0.3 mg/dL      Globulin 2.2 g/dL      Albumin/Globulin Ratio 1.0      Anion Gap 3.0 (L)     HEMOLYZED INDEX [[id number]]  (Abnormal) Collected:07/28/12 0557     Hemolyzed Index 20 (H) Updated:07/28/12 0717    GFR [[id number]] Collected:07/28/12 0557     EGFR >60.0 Updated:07/28/12 0717    Blood culture [[id number]] Collected:07/22/12 1555    Specimen Information:Blood / Blood, Venipuncture Updated:07/27/12 2215    Narrative:    ORDER#: [id number]                                    ORDERED BY: AKSENTIJEVICH,  SOURCE: Blood, Venipuncture in the arm  COLLECTED:  07/22/12 15:55  ANTIBIOTICS AT COLL.:                                RECEIVED :  07/22/12 19:43  Culture Blood                              FINAL       07/27/12 22:15  07/27/12   No growth after 5 days of incubation.      Blood culture [[id number]] Collected:07/22/12 1555    Specimen Information:Blood / Blood, Intravenous Line Updated:07/27/12 2215    Narrative:    ORDER#: [id number]                                    ORDERED BY: AKSENTIJEVICH,  SOURCE: Blood, Intravenous Line right chest          COLLECTED:  07/22/12 15:55  ANTIBIOTICS AT COLL.:                                 RECEIVED :  07/22/12 19:43  Culture Blood                              FINAL       07/27/12 22:15  07/27/12   No growth after 5 days of incubation.            IMAGING DATA:  Ct Sinus Facial Bones Wo Contrast    06/28/2012  HISTORY: Sinusitis  COMPARISON: None.  TECHNIQUE: Axial CT scans slices of the paranasal sinuses and facial bones with sagittal and coronal reformats  FINDINGS: No fracture or other bony abnormality. The sinuses are clear. The ostiomeatal complexes are patent. There is no mass or nasopharynx. There is a left concha bullosa. The nasal septum is midline.      06/28/2012   Normal  Lynnae Prude, MD  06/28/2012 2:42 PM     Ct Chest Wo Contrast    06/28/2012  HISTORY: Cough  COMPARISON: None  TECHNIQUE:  Helical CT scan of the chest was obtained from the apices to the lung bases without intravenous contrast.      FINDINGS:  Lungs and central airways: Atelectasis or scarring in the lower lobes. Pleura: Trace effusions bilaterally. Aorta and Great Vessels: Within normal limits. Heart: No pericardial effusion. No coronary artery calcification. Mediastinum and hila: No mass or adenopathy Upper Abdomen: No significant abnormality. Bones: No significant abnormality. Chest wall and lower neck: Normal       06/28/2012   Atelectasis or scarring in the lower lobes. Trace bilateral pleural effusions.  Lynnae Prude, MD  06/28/2012 2:47 PM     Mri Brain [name] Wo Contrast    07/27/2012  INDICATION: Unsteadiness. Lightheadedness. Acute leukemia.  TECHNIQUE: Axial T1-weighted, FLAIR, T2*, and  T2-weighted images of the brain were obtained along with sagittal T1-weighted images. An axial diffusion-weighted sequence was obtained along with an ADC map. Following  the administration of 5 cc of gadolinium, T1-weighted axial and coronal images were obtained.  FINDINGS: No intracranial mass lesion, hemorrhage, or territorial infarction is demonstrated. The ventricular system and subarachnoid spaces are within normal limits. There  are few small foci of hyperintensity  on long TR images in the deep white matter suggestive of minimal microvascular ischemic changes. Diffusion-weighted images show no regions of restricted diffusion.  Following contrast administration, no abnormal enhancement is noted. The visualized portions of the sinuses and mastoid air cells are clear.      07/27/2012   Mild microvascular ischemic changes. No intracranial mass or abnormal enhancement is noted.  Merri Ray, MD  07/27/2012 3:46 PM     Nm Cardiac Muga (at Rest)    07/02/2012  CLINICAL INDICATION: Prechemotherapy evaluation. Leukemia.  TECHNIQUE: A dynamic MUGA scan was obtained. Imaging obtained in the LAO, lateral, and anterior projections. The left ventricle region of interest was drawn, on the best separation view, in end-diastole and end-systole, and ejection fraction was calculated. 23.4 mCi of Tc63m labeled autologous RBCs, using the Ultratag technique was administered.  FINDINGS: Normal wall motion is seen in the left ventricle. The ejection fraction is calculated at 79%, which is normal.      07/02/2012   NORMAL EJECTION FRACTION, OF 79%.  Darnelle Maffucci, MD  07/02/2012 2:23 PM     Xr Chest Ap Portable    07/22/2012  CLINICAL INDICATION: upright indication fever  COMPARISON: 07/13/2012  FINDINGS:   A single portable AP view of the chest was obtained. Central line remains positioned in the SVC. The lungs are clear. The heart and vascularity are within normal limits. The lateral costophrenic angles are sharp.       07/22/2012    NO ACUTE PROCESS.   Heron Nay, MD  07/22/2012 7:50 AM     Xr Chest Ap Portable    07/13/2012  History: shortness of breath  Technique: Single Portable View  Comparison: June 13  Findings: There are small bilateral pleural effusions with trace bibasilar atelectasis. PICC line tip in the SVC. There is no pneumothorax. The heart is normal in size.    The mediastinum is within normal limits.           07/13/2012   Trace bilateral pleural  effusions,   Laurena Slimmer, MD  07/13/2012 9:48 AM     Biopsy, Bone Marrow    06/29/2012  EXAMINATION: Fluoroscopic guided bone marrow aspirate and core biopsy.  INTERVENTIONALIST: Verlee Rossetti, MD.  HISTORY:  Patient is a 57 year old female with pancytopenia and neutropenic fever.  Anesthesia: Moderate sedation with Fentanyl and Versed was administered with appropriate physiologic monitoring using an independent trained observer for 30 minutes. 1% lidocaine local was administered as well.  TECHNIQUE: With the patient in the prone position the skin over the left iliac crest was prepped and draped in usual sterile fashion. Local anesthesia was applied to superficial and deep tissues with 2% lidocaine. Subsequently, under direct fluoroscopic guidance, a bone marrow needle was advanced to the posteromedial aspect of the superior left iliac crest. The outer cortex was traversed and bone marrow aspiration performed and the needle removed.    The bone marrow core needle was then advanced to a separate site on the iliac crest to obtain a core of bone marrow tissue. The samples were given to hematology for analysis. The patient tolerated procedure well without apparent competition.  INTERPRETATION: Fluoroscopy revealed successful access to the left iliac crest marrow cavity for bone marrow aspiration and for core biopsy.  Fluoroscopy Time: 0.3 min      06/29/2012   Technically successful fluoroscopic guided bone marrow aspirate and core biopsy for pancytopenia.  Larrie Kass, MD  06/29/2012 4:21 PM  Tunneled Cath Placement (permcath)    07/03/2012   Clinical history: Power line  Interventionalist: Dara Lords, MD  Informed consent obtained and all questions answered.  Anesthesia:  Moderate sedation with appropriate monitoring plus local. This consisted of Versed and fentanyl administration with continuous hemodynamic monitoring with a trained observer. Total anesthesia time with direct physician supervision 20 minutes.   Procedure: After standard prepping and draping with maximal sterile barrier technique and local anesthesia with 2% Xylocaine, percutaneous puncture of the right internal jugular vein is performed under ultrasound guidance with [place] image obtained for the medical record. The guidewire is advanced under fluoroscopic guidance. Dilator is advanced over the wire and left in place and flushed.  A double-lumen power line cuffed catheter is tunneled from the venotomy site to an exit site on the right anterior chest wall after prior infiltration of the right anterior chest wall with Xylocaine plus epinephrine.  The dilator at the venotomy site is exchanged for a peel-away sheath. The double-lumen power line (equivalent to Groshong) catheter is advanced through the peel-away sheath. The peel-away sheath was removed. The catheter length is adjusted to position the catheter tip in the superior vena cava.  The catheter secured in place with 2-0 Prolene suture. The venotomy site was closed with  Dermabond.  Complications: None apparent.  Fluoroscopy time:0.3 minutes  Findings: The right internal jugular vein is patent by US imaging. An [place] image is obtained for the medical record. Catheter tip is in the superior vena cava.      07/03/2012    successful ultrasound and fluoroscopically guided placement of a right internal jugular dual-lumen cuffed power line catheter. The catheter may be used immediately.  Dara Lords, MD  07/03/2012 2:42 PM                       Larene Pickett, MD  [facility]    [phone]

## 2012-07-28 NOTE — Plan of Care (Signed)
Problem: Pain  Goal: Patient's pain/discomfort is manageable  Outcome: Progressing  Slept for longer interval  Denies pain.    Comments:   Temp 100.0 tylenol 650mg   Po given will monitor temperature.

## 2012-07-28 NOTE — Discharge Instructions (Signed)
DISCHARGE NOTE    Date Time: 07/28/2012 3:03 PM  Patient Name: [name] Section  Attending Physician: Eric Form, MD    Date of Admission:   06/25/2012    Date of Discharge:   07/28/12    Reason for Admission:   Fever [780.60]  Neutropenia [288.00]  ................Marland Kitchen  Acute Leukemia.  Wil need high intensity chenotherapy    Problems:   Lists the present on admission hospital problems  Present on Admission:   . Neutropenic fever    Hospital Problems:  Active Problems:   Neutropenic fever      Problem Lists:  Patient Active Problem List   Diagnosis   . Chondromalacia of patella   . Neutropenic fever       Active Multi-disciplinary problems  Active Multi-Disciplinary problems:  Health Promotion [phone] (06/26/12)  Assessment/Plan ***  Safety [4540981] (06/26/12)  Assessment/Plan ***  Pain [1914782] (06/26/12)  Assessment/Plan ***  Psychosocial and Spiritual Needs [9562130] (06/26/12)  Assessment/Plan ***  Hemodynamic Status: Immune Suppressed-Hematologic [8657846] (06/26/12)  Assessment/Plan ***  Infection/Potential for Infection [9629528] (06/26/12)  Assessment/Plan ***  Protective Precautions [in description add neutropenic precautions] [4132440] (06/26/12)  Assessment/Plan ***  Anxiety [1027253] (06/26/12)  Assessment/Plan ***  Moderate/High Fall Risk Score >/=15 [6644034] (06/26/12)  Assessment/Plan ***             Discharge Dx:   ***    Consultations:   ***    Procedures performed:   [facility] Course:   ***     Discharge Medications:     Current Discharge Medication List      START taking these medications    Details   ALPRAZolam (XANAX) 0.25 MG tablet Take 1 tablet (0.25 mg total) by mouth nightly as needed for Anxiety.  Qty: 30 tablet, Refills: 0      fluconazole (DIFLUCAN) 100 MG tablet Take 1 tablet (100 mg total) by mouth daily.  Qty: 5 tablet, Refills: 0      levofloxacin (LEVAQUIN) 500 MG tablet Take 1 tablet (500 mg total) by mouth daily.  Qty: 5 tablet, Refills: 0               Discharge Instructions:    ***    Follow-up with {Specialty physician d/c note md:30596} in {Follow-up VQQV:95638}      Signed by: Enrique Sack

## 2012-07-28 NOTE — PT Progress Note (Signed)
[facility]  Physical Therapy Treatment    [address]  [place] [address]  [phone]    Patient:  [name] MRN#:  [id number]  Unit:  [facility] MEDICAL ONCOLOGY RENAL Room/Bed:  A2118/A2118-A    Time of treatment: Time Calculation  PT Received On: 07/28/12  Start Time: 4696  Stop Time: 1035  Time Calculation (min): 40 min           Precautions:   Precautions  Weight Bearing Status: no restrictions  Other Precautions: Neutropenic precautions    Updated X-Rays/Tests/Labs:  Lab Results   Component Value Date/Time    HGB 8.4* 07/28/2012  5:57 AM    HCT 24.9* 07/28/2012  5:57 AM    K 3.6 07/28/2012  5:57 AM    NA 138 07/28/2012  5:57 AM    INR 1.0 06/29/2012  8:59 AM         Subjective: Pt states that her walking has improved since last visit.        Pain Assessment  Pain Assessment: Numeric Scale (0-10)  Pain Score: 0-No pain    Patient's medical condition is appropriate for Physical Therapy intervention at this time.  Patient is agreeable to participation in the therapy session. Nursing clears patient for therapy.    Objective:  Observation of Patient/Vital Signs:     Pre (supine) BP: 108/69 O2: 96 HR: 74    Post: (supine) BP: 112/70 O2: 98 HR: 78    Patient received in bed with IV line in place.    Cognition  Arousal/Alertness: Appropriate responses to stimuli  Following Commands: Follows all commands and directions without difficulty           Functional Mobility  Rolling: Independent  Supine to Sit: Independent  Scooting: Independent  Sit to Supine: Independent  Sit to Stand: Independent  Stand to Sit: Independent  Transfers  Bed to Chair: Stand by assistance  Locomotion  Ambulation: independently  Ambulation Distance (Feet): 400 Feet  Pattern: decreased cadence (improving pattern )    Therapeutic Exercise  Straight Leg Raise: 2x10  Hip Flexion: 2x10 seated   Hip Abduction: 2x10  Knee AROM Short Arc Quad: 2x10  Ankle Pumps: 2x10  At EOB pt performed heel raises and marches 2x10  each                  Educated the patient to role of physical therapy, plan of care, goals of therapy and HEP, safety with mobility and ADLs, discharge instructions, home safety.  Patient left in bed with all needs in place and call bell within reach. RN notified of session outcome.      Assessment: PT evaluation reviewed first for goals; pt tolerated and performed session safely; pt is safe and independent with transfers and ambulation; pt had no c/o's with activity; vitals were stable during session; pt is clear to d/c home with supervision.         Plan: Treatment/Interventions: Gait training;Neuromuscular re-education      PT Frequency: one time visit   Discharge from Acute Care Services.    Goals: Goals  Goal Formulation: With patient/family  Time for Goal Acheivement: 1 visit  Goals: Select goal  Pt Will Ambulate: > 200 feet;with single point cane;Goal met  Pt Will Go Up / Down Stairs: 6-10 stairs;With stand by assist;Not met  Pt Will Perform Home Exer Program: Independently;Goal met        DME Recommended for Discharge: Single point cane  Discharge Recommendation: Home with supervision    Theora Gianotti, PTA  07/28/2012  10:41 AM

## 2012-07-28 NOTE — Consults (Signed)
Nutritional Support Services  Nutrition Follow-up    [name] 57 y.o. female   MRN: [id number]    Reviewed Nutrition during and after cancer treatment with patient.  Stressed the importance of eating 6 small calorie/nutrient dense food to prevent further weight loss and maintain LBM.  Snack ideas were discussed. Handout is given for home use. Pt with good comprehension and able to verbalize the understanding of MNT during cancer treatment.    Royetta Car, MS, RD, CDE  [phone] (office)  [phone] (spectra link)

## 2012-08-06 NOTE — Discharge Summary (Signed)
Physician Discharge Summary     Patient ID:  [name]  [id number]  57 y.o.  24-Aug-1955    Admit date: 06/25/2012    Discharge date and time: 07/28/2012  5:37 PM     Admitting Physician: Eric Form, MD     Discharge Physician: Tobie Lords    Reason for Admission/ HPI: fever, Neutropenia    Discharge Diagnoses:   Acute Leukemia, NOS   Neutropenic fever   .Pancytopenia.   .Weight loss with a body mass index of 18 shows mild to moderate   Malnutrition   . Skin rash.   Epigastric pain   Sinus brady   BM with Residual AML, hypocellular marrow      Problem List  Patient Active Problem List   Diagnosis   . Chondromalacia of patella   . Neutropenic fever       Discharge Meds:  Discharge Medication List as of 07/28/2012  3:08 PM      START taking these medications    Details   ALPRAZolam (XANAX) 0.25 MG tablet Take 1 tablet (0.25 mg total) by mouth nightly as needed for Anxiety., Starting 07/28/2012, Until Discontinued, Print      fluconazole (DIFLUCAN) 100 MG tablet Take 1 tablet (100 mg total) by mouth daily., Starting 07/28/2012, Until Sun 08/02/12, Print      levofloxacin (LEVAQUIN) 500 MG tablet Take 1 tablet (500 mg total) by mouth daily., Starting 07/28/2012, Until Sun 08/02/12, Print           Consultations/Treatment team:  Treatment Team: Consulting Physician: Eric Form, MD; Case Manager: Lajuan Lines, RN; Case Manager: Rollene Rotunda, RN; Physical Therapist: Theora Gianotti, PTA  Labs/Imaging:  Results     Procedure Component Value Units Date/Time    CBC and differential [[id number]]  (Abnormal) Collected:07/28/12 0557    Specimen Information:Blood / Blood Updated:07/28/12 0801     WBC 51.61 (H)      RBC 2.65 (L)      Hgb 8.4 (L) g/dL      Hematocrit 47.8 (L) %      MCV 94.0 fL      MCH 31.7 pg      MCHC 33.7 g/dL      RDW 14 %      Platelets 84 (L)      MPV 10.4 fL     Manual Differential [[id number]]  (Abnormal) Collected:07/28/12 0557     Segmented Neutrophils 70 % Updated:07/28/12 0801     Band  Neutrophils 3 %      Lymphocytes Manual 5 %      Monocytes Manual 4 %      Eosinophils Manual 0 %      Basophils Manual 0 %      Metamyelocytes 4 %      Myelocytes 13 %      Promyelocyte 1 %      Nucleated RBC 0      Abs Seg Manual 36.13 (H)      Bands Absolute 1.55 (H)      Absolute Lymph Manual 2.58      Monocytes Absolute 2.06 (H)      Absolute Eos Manual 0.00      Absolute Baso Manual 0.00      Metamyelocytes Absolute 2.06 (H)      Absolute Myelocyte 6.71 (H)      Promyelocytes Absolute 0.52 (H)     Cell MorpHology [[id number]]  (Abnormal) Collected:07/28/12 0557     Cell  Morphology: Normal Updated:07/28/12 0801     Toxic Granulation =1+ (A)     Comprehensive metabolic panel [[id number]]  (Abnormal) Collected:07/28/12 0557    Specimen Information:Blood Updated:07/28/12 0717     Glucose 74 mg/dL      BUN 47.8 mg/dL      Creatinine 0.6 mg/dL      Sodium 295      Potassium 3.6      Chloride 103      CO2 32 (H)      CALCIUM 8.5 mg/dL      Protein, Total 4.4 (L) g/dL      Albumin 2.2 (L) g/dL      AST (SGOT) 37 (H) U/L      ALT 30 U/L      Alkaline Phosphatase 126 U/L      Bilirubin, Total 0.3 mg/dL      Globulin 2.2 g/dL      Albumin/Globulin Ratio 1.0      Anion Gap 3.0 (L)     HEMOLYZED INDEX [[id number]]  (Abnormal) Collected:07/28/12 0557     Hemolyzed Index 20 (H) Updated:07/28/12 0717    GFR [[id number]] Collected:07/28/12 0557     EGFR >60.0 Updated:07/28/12 0717    Blood culture [[id number]] Collected:07/22/12 1555    Specimen Information:Blood / Blood, Venipuncture Updated:07/27/12 2215    Narrative:    ORDER#: [id number]                                    ORDERED BY: AKSENTIJEVICH,  SOURCE: Blood, Venipuncture in the arm               COLLECTED:  07/22/12 15:55  ANTIBIOTICS AT COLL.:                                RECEIVED :  07/22/12 19:43  Culture Blood                              FINAL       07/27/12 22:15  07/27/12   No growth after 5 days of incubation.      Blood culture [[id number]] Collected:07/22/12  1555    Specimen Information:Blood / Blood, Intravenous Line Updated:07/27/12 2215    Narrative:    ORDER#: [id number]                                    ORDERED BY: AKSENTIJEVICH,  SOURCE: Blood, Intravenous Line right chest          COLLECTED:  07/22/12 15:55  ANTIBIOTICS AT COLL.:                                RECEIVED :  07/22/12 19:43  Culture Blood                              FINAL       07/27/12 22:15  07/27/12   No growth after 5 days of incubation.            Mri Brain W Wo Contrast    07/27/2012  INDICATION: Unsteadiness. Lightheadedness. Acute leukemia.  TECHNIQUE: Axial T1-weighted,  FLAIR, T2*, and  T2-weighted images of the brain were obtained along with sagittal T1-weighted images. An axial diffusion-weighted sequence was obtained along with an ADC map. Following  the administration of 5 cc of gadolinium, T1-weighted axial and coronal images were obtained.  FINDINGS: No intracranial mass lesion, hemorrhage, or territorial infarction is demonstrated. The ventricular system and subarachnoid spaces are within normal limits. There are few small foci of hyperintensity on long TR images in the deep white matter suggestive of minimal microvascular ischemic changes. Diffusion-weighted images show no regions of restricted diffusion.  Following contrast administration, no abnormal enhancement is noted. The visualized portions of the sinuses and mastoid air cells are clear.      07/27/2012   Mild microvascular ischemic changes. No intracranial mass or abnormal enhancement is noted.  Merri Ray, MD  07/27/2012 3:46 PM     Xr Chest Ap Portable    07/22/2012  CLINICAL INDICATION: upright indication fever  COMPARISON: 07/13/2012  FINDINGS:   A single portable AP view of the chest was obtained. Central line remains positioned in the SVC. The lungs are clear. The heart and vascularity are within normal limits. The lateral costophrenic angles are sharp.       07/22/2012    NO ACUTE PROCESS.   Heron Nay, MD  07/22/2012  7:50 AM     Xr Chest Ap Portable    07/13/2012  History: shortness of breath  Technique: Single Portable View  Comparison: June 13  Findings: There are small bilateral pleural effusions with trace bibasilar atelectasis. PICC line tip in the SVC. There is no pneumothorax. The heart is normal in size.    The mediastinum is within normal limits.           07/13/2012   Trace bilateral pleural effusions,   Laurena Slimmer, MD  07/13/2012 9:48 AM       Consultations performed: ID, Oncology    Hospital Course/ Treatments: Ms Perona was a healthy 57 year  Old F who presented with Fever and was found to be Neutropenic which didn`t resolve with conventional therapy. Bone marrow biopsy revealed leukemia. She underwent chemotherapy which she fairly well tolerated, however repeat BM showed hypocellularity with residual leukemic cells. She will follow up with oncology as an out patient for possible referral to [facility]. Her stay was prolonged  B/c of Neutropenia but she responded to G-CSF. She was given a few doses of Levaquin to take since she had low grade fever. She also had drug reactions to Margaretann Loveless which was treated with Solumedrol.    Discharge Exam/Diagnostic Studies:    Filed Vitals:    07/28/12 1514   BP: 124/73   Pulse: 102   Temp: 98.8 F (37.1 C)   Resp: 18   SpO2: 98%         General appearance - alert and in no distress, oriented to person, place, and time  Head - Normocephalic, atraumatic  Eyes - pupils equal and reactive, extraocular eye movements intact  Nose - normal and patent, no erythema, discharge or polyps  Mouth - mucous membranes moist, pharynx normal without lesions.  Neck - supple, no thyromegaly, JVD  Lymphatics - no palpable lymphadenopathy  Chest - clear to auscultation, no wheezes, rales or rhonchi, symmetric air entry  Heart - normal rate, regular rhythm, normal S1, S2, no murmurs, rubs, clicks or gallops  Abdomen - soft, nontender, nondistended, no masses or organomegaly  Musculoskeletal - no joint  tenderness, deformity or  swelling  Extremities - peripheral pulses normal, no pedal edema, no clubbing or cyanosis  Skin - normal coloration and turgor, no rashes, no suspicious skin lesions noted  Neurological - alert, oriented, normal speech, no focal findings or movement disorder     Disposition: home with family      Patient Instructions:   Follow up with Oncology in 1 weeks         Signed:  Eric Form, MD  08/06/2012  12:56 PM  Pager: [phone]

## 2012-08-09 ENCOUNTER — Emergency Department: Payer: 59

## 2012-08-09 ENCOUNTER — Emergency Department
Admission: EM | Admit: 2012-08-09 | Discharge: 2012-08-09 | Disposition: A | Discharge status: Home / Self Care | Payer: 59 | Attending: Emergency Medicine | Admitting: Emergency Medicine

## 2012-08-09 ENCOUNTER — Emergency Department: Payer: PRIVATE HEALTH INSURANCE

## 2012-08-09 DIAGNOSIS — J3489 Other specified disorders of nose and nasal sinuses: Secondary | ICD-10-CM | POA: Insufficient documentation

## 2012-08-09 DIAGNOSIS — J45909 Unspecified asthma, uncomplicated: Secondary | ICD-10-CM | POA: Insufficient documentation

## 2012-08-09 DIAGNOSIS — R0602 Shortness of breath: Secondary | ICD-10-CM | POA: Insufficient documentation

## 2012-08-09 DIAGNOSIS — C9590 Leukemia, unspecified not having achieved remission: Secondary | ICD-10-CM | POA: Insufficient documentation

## 2012-08-09 HISTORY — DX: Leukemia, unspecified not having achieved remission: C95.90

## 2012-08-09 LAB — CBC AND DIFFERENTIAL
Basophils Absolute Automated: 0.12 (ref 0.00–0.20)
Basophils Automated: 1 %
Eosinophils Absolute Automated: 0.08 (ref 0.00–0.70)
Eosinophils Automated: 1 %
Hematocrit: 28.1 % — ABNORMAL LOW (ref 37.0–47.0)
Hgb: 9 g/dL — ABNORMAL LOW (ref 12.0–16.0)
Immature Granulocytes Absolute: 0.11 — ABNORMAL HIGH
Immature Granulocytes: 1 %
Lymphocytes Absolute Automated: 1.84 (ref 0.50–4.40)
Lymphocytes Automated: 19 %
MCH: 31.9 pg (ref 28.0–32.0)
MCHC: 32 g/dL (ref 32.0–36.0)
MCV: 99.6 fL (ref 80.0–100.0)
MPV: 9.9 fL (ref 9.4–12.3)
Monocytes Absolute Automated: 1.45 — ABNORMAL HIGH (ref 0.00–1.20)
Monocytes: 15 %
Neutrophils Absolute: 6.05 (ref 1.80–8.10)
Neutrophils: 63 %
Nucleated RBC: 0 (ref 0–1)
Platelets: 420 — ABNORMAL HIGH (ref 140–400)
RBC: 2.82 — ABNORMAL LOW (ref 4.20–5.40)
RDW: 16 % — ABNORMAL HIGH (ref 12–15)
WBC: 9.54 (ref 3.50–10.80)

## 2012-08-09 LAB — BASIC METABOLIC PANEL
Anion Gap: 12 (ref 5.0–15.0)
BUN: 24 mg/dL — ABNORMAL HIGH (ref 7.0–19.0)
CO2: 22 (ref 22–29)
Calcium: 9.8 mg/dL (ref 8.5–10.5)
Chloride: 107 (ref 98–107)
Creatinine: 0.6 mg/dL (ref 0.6–1.0)
Glucose: 106 mg/dL — ABNORMAL HIGH (ref 70–100)
Potassium: 3.9 (ref 3.5–5.1)
Sodium: 141 (ref 136–145)

## 2012-08-09 LAB — GFR: EGFR: >60

## 2012-08-09 LAB — CREATININE WHOLE BLOOD: Whole Blood Creatinine: 0.49 mg/dL (ref 0.40–1.10)

## 2012-08-09 LAB — HEMOLYSIS INDEX: Hemolysis Index: 8 (ref 0–18)

## 2012-08-09 MED ORDER — IOHEXOL 350 MG/ML IV SOLN
100.00 mL | Freq: Once | INTRAVENOUS | Status: AC | PRN
Start: 2012-08-09 — End: 2012-08-09
  Administered 2012-08-09: 100 mL via INTRAVENOUS

## 2012-08-09 MED ORDER — ONDANSETRON 4 MG PO TBDP
4.00 mg | ORAL_TABLET | Freq: Four times a day (QID) | ORAL | Status: DC | PRN
Start: 2012-08-09 — End: 2012-08-16

## 2012-08-09 MED ORDER — IPRATROPIUM BROMIDE 0.02 % IN SOLN
0.5000 mg | Freq: Once | RESPIRATORY_TRACT | Status: AC
Start: 2012-08-09 — End: 2012-08-09
  Administered 2012-08-09: 0.5 mg via RESPIRATORY_TRACT
  Filled 2012-08-09: qty 2.5

## 2012-08-09 MED ORDER — ALBUTEROL SULFATE (2.5 MG/3ML) 0.083% IN NEBU
2.5000 mg | INHALATION_SOLUTION | Freq: Once | RESPIRATORY_TRACT | Status: AC
Start: 2012-08-09 — End: 2012-08-09
  Administered 2012-08-09: 2.5 mg via RESPIRATORY_TRACT
  Filled 2012-08-09: qty 3

## 2012-08-09 MED ORDER — ALBUTEROL SULFATE HFA 108 (90 BASE) MCG/ACT IN AERS
2.00 | INHALATION_SPRAY | Freq: Once | RESPIRATORY_TRACT | Status: AC
Start: 2012-08-09 — End: 2012-08-09
  Administered 2012-08-09: 2 via RESPIRATORY_TRACT
  Filled 2012-08-09: qty 1

## 2012-08-09 MED ORDER — FLUTICASONE PROPIONATE 50 MCG/ACT NA SUSP
2.00 | Freq: Every day | NASAL | Status: DC
Start: 2012-08-09 — End: 2012-08-16

## 2012-08-09 NOTE — ED Provider Notes (Signed)
EMERGENCY DEPARTMENT HISTORY AND PHYSICAL EXAM     Physician/Midlevel provider first contact with patient: 08/09/12 1736         Date: 08/09/2012  Patient Name: [name] Section  Attending Physician:  Ames Dura, DO, FACOEP      History of Presenting Illness     Chief Complaint   Patient presents with   . Shortness of Breath       History Provided By: Pt  Chief Complaint: SOB  Onset: 3 hours ago  Timing: Waxing and waning  Quality: hx of asthma, can't get enough air  Severity: Moderate  Modifying Factors: No relief with ProAir inhaler, worse with exertion  Associated sxs: Congestion    Additional History: [name] is a 57 y.o. female presenting to the ED with a CC of SOB that began 3 hours ago.  The patient has a hx of asthma and has not had any relief with her ProAir inhaler (unsure if it was effective because it was a sample inhaler).  She states the SOB worsened when she walked around with a friend.  She has a hx of leukemia and had a bone biopsy 2 days ago. She notes congestion in the ED but denies any fevers, CP, Abdominal pain, dysuria, constipation, nausea, or vomiting.   She states she felt bad from 4-8pm 2 days ago after the biopsy (received 30 mg of Morphine before the biopsy). She felt fatigued yesterday.  She was discharged from [facility] on  07/28/2012.  Pt quit smoking 30 years ago.    PCP: Edwyna Shell, MD  Onc: Aksentijevich    Current Facility-Administered Medications   Medication Dose Route Frequency Provider Last Rate Last Dose   . albuterol (PROVENTIL HFA;VENTOLIN HFA) inhaler 2 puff  2 puff Inhalation Once Ames Dura, DO       . [COMPLETED] albuterol (PROVENTIL) nebulizer solution 2.5 mg  2.5 mg Nebulization Once Ames Dura, DO   2.5 mg at 08/09/12 1754   . [COMPLETED] iohexol (OMNIPAQUE) 350 MG/ML injection 100 mL  100 mL Intravenous ONCE PRN Ames Dura, DO   100 mL at 08/09/12 1944   . [COMPLETED] ipratropium (ATROVENT) 0.02 % nebulizer solution 0.5 mg  0.5  mg Nebulization Once Ames Dura, DO   0.5 mg at 08/09/12 1754     Current Outpatient Prescriptions   Medication Sig Dispense Refill   . fluticasone (FLONASE) 50 MCG/ACT nasal spray 2 sprays by Nasal route daily.  16 g  0   . ondansetron (ZOFRAN ODT) 4 MG disintegrating tablet Take 1 tablet (4 mg total) by mouth every 6 (six) hours as needed for Nausea.  20 tablet  0   . [DISCONTINUED] ALPRAZolam (XANAX) 0.25 MG tablet Take 1 tablet (0.25 mg total) by mouth nightly as needed for Anxiety.  30 tablet  0       Past Medical History   Past Medical History:  Past Medical History   Diagnosis Date   . Asthma without status asthmaticus    . Leukemia        Past Surgical History:  Past Surgical History   Procedure Date   . Hand surgery        Family History:  No family history on file.    Social History:  History   Substance Use Topics   . Smoking status: Never Smoker    . Smokeless tobacco: Not on file   . Alcohol Use: Yes      Comment: very rarely  Allergies:  Allergies   Allergen Reactions   . Codeine Nausea Only       Review of Systems     Review of Systems   Constitutional: Negative for fever.   HENT: Positive for congestion.    Respiratory: Positive for shortness of breath. Negative for cough.    Cardiovascular: Negative for chest pain.   Gastrointestinal: Negative for nausea, abdominal pain and constipation.   Genitourinary: Negative for dysuria.   Musculoskeletal: Negative for back pain.   All other systems reviewed and are negative.          Physical Exam     BP 116/58  Pulse 89  Temp 98.9 F (37.2 C)  Resp 17  Ht 1.651 m  Wt 48.988 kg  BMI 17.97 kg/m2  SpO2 99%  Pulse Oximetry Analysis - Normal 100% on RA      Physical Exam   Nursing note and vitals reviewed.  Constitutional: She is oriented to person, place, and time. She appears well-developed and well-nourished. No distress.   HENT:   Head: Normocephalic and atraumatic.   Mouth/Throat: Oropharynx is clear and moist. No oropharyngeal exudate.         Mild b/l sinus congestion   Neck: Normal range of motion. Neck supple.   Cardiovascular: Normal rate, regular rhythm and normal heart sounds.    Pulmonary/Chest: Effort normal. No respiratory distress. She has wheezes (slight b/l upper airway expiratory wheeze). She has no rales.   Abdominal: Soft. She exhibits no distension. There is no tenderness.   Musculoskeletal: Normal range of motion. She exhibits no edema and no tenderness.   Neurological: She is alert and oriented to person, place, and time.   Skin: Skin is warm and dry. She is not diaphoretic.   Psychiatric: She has a normal mood and affect.         Diagnostic Study Results     Labs -     Results     Procedure Component Value Units Date/Time    Basic Metabolic Panel (BMP) [[id number]]  (Abnormal) Collected:08/09/12 1853    Specimen Information:Blood Updated:08/09/12 1926     Glucose 106 (H) mg/dL      BUN 40.9 (H) mg/dL      Creatinine 0.6 mg/dL      CALCIUM 9.8 mg/dL      Sodium 811      Potassium 3.9      Chloride 107      CO2 22      Anion Gap 12.0     HEMOLYZED INDEX [[id number]] Collected:08/09/12 1853     Hemolyzed Index 8 Updated:08/09/12 1926    GFR [[id number]] Collected:08/09/12 1853     EGFR >60.0 Updated:08/09/12 1926    CBC and differential [[id number]]  (Abnormal) Collected:08/09/12 1853    Specimen Information:Blood / Blood Updated:08/09/12 1914     WBC 9.54      RBC 2.82 (L)      Hgb 9.0 (L) g/dL      Hematocrit 46.9 (L) %      MCV 99.6 fL      MCH 31.9 pg      MCHC 32.0 g/dL      RDW 16 (H) %      Platelets 420 (H)      MPV 9.9 fL      Neutrophils 63 %      Lymphocytes Automated 19 %      Monocytes 15 %      Eosinophils Automated 1 %  Basophils Automated 1 %      Immature Granulocyte 1 %      Nucleated RBC 0      Neutrophils Absolute 6.05      Abs Lymph Automated 1.84      Abs Mono Automated 1.45 (H)      Abs Eos Automated 0.08      Absolute Baso Automated 0.12      Absolute Immature Granulocyte 0.11 (H)     CREATININE WHOLE BLOOD  [[id number]] Collected:08/09/12 1853     Creatinine Whole Blood 0.49 mg/dL [id number]:14/78/29 5621          Radiologic Studies -   Radiology Results (24 Hour)     Procedure Component Value Units Date/Time    CT Angio Chest [[id number]] Collected:08/09/12 1945    Order Status:Completed  Updated:08/09/12 1951    Narrative:    HISTORY: Shortness of breath, chest pain.    TECHNIQUE: Enhanced, computed tomography of the chest was performed at  3mm slice thickness. 3 D post processing was performed. 100 cc of  Optiray 350 were given intravenously.    PRIOR: No prior studies.    FINDINGS:     There are no finding to suggest pulmonary embolism.. The lung fields  are clear. There are no pleural effusions. Evaluation of mediastinum  shows no evidence of lymph nodes or enlarged masses. The great vessels  are normal in caliber. The heart is normal in size and shape without  pericardial effusions. The bony structures are unremarkable.       Impression:     Examination within normal limits.      Georgana Curio, MD   08/09/2012 7:47 PM      .    Medical Decision Making   I am the first provider for this patient.    I reviewed the vital signs, available nursing notes, past medical history, past surgical history, family history and social history.    Vital Signs-Reviewed the patient's vital signs.     Patient Vitals for the past 12 hrs:   BP Temp Pulse Resp   08/09/12 1946 116/58 mmHg - 89  17    08/09/12 1712 117/57 mmHg 98.9 F (37.2 C) 91  17        Cardiac Monitor:  Rate: 98 bpm  Rhythm:  Normal Sinus Rhythm     Old Medical Records: Old medical records.  Nursing notes.     Doctor's Notes     ED Course:   5:45 PM - Offered pt medication for pain but pt declined.    6:13 PM - Considering h/o CA with SOB, DOE, and some chest discomfort, will r/o PE as cause of symptoms. Pt has slight wheeze in ED, so will treat with neb and reassess. No dyspnea at rest, saturating 100% on RA.  7:11 PM  physician re-evaluated pt at bedside and pt  states that her breathing improved after the treatment.  Pt seen ambulating to the bathroom without difficulty. She denies any sinus pressure.  8:16 PM  Pt is feeling better and would like to go home.  Discussed test results with pt and counseled on diagnosis, f/u plans, and signs and symptoms when to return to ED.  Pt is stable and ready for discharge.  Discussed further outpatient treatment of symptoms of wheeze and sinus congestion with post-nasal drip. All questions and concerns addressed.        Diagnosis and Treatment Plan       Clinical Impression:  1. Shortness of breath    2. Leukemia    3. Sinus congestion    4. Wheeze        Treatment Plan:   ED Disposition     Discharge [facility] discharge to home/self care.    Condition at discharge: Stable              _______________________________    Attestations:  This note is prepared by Dorthey Sawyer, acting as scribe for Ames Dura, DO, FACOEP. The scribe's documentation has been prepared under my direction and personally reviewed by me in its entirety.  I confirm that the note above accurately reflects all work, treatment, procedures, and medical decision making performed by me.     I am the first provider for this patient.      Ames Dura, DO, FACOEP is the primary emergency doctor of record.    _______________________________            Ames Dura, DO  08/09/12 2031

## 2012-08-09 NOTE — ED Notes (Signed)
Trouble breathing today, has had recent treatment for leukemia and has been feeling fine. No relief with proair inhaler.

## 2012-08-09 NOTE — Discharge Instructions (Signed)
Use a Netipot as needed for congestion.      Shortness of Breath, Unclear Etiology    You have been seen today for shortness of breath, or difficulty breathing; but we don't know the cause.    This means that after talking about your medical problems, doing a physical exam, and looking at the tests that were done, there is still not a clear explanation as to why you are short of breath. You may need to go see your doctor for another exam or more tests to find out why you are short of breath. Marland Kitchen    CAUTION: Even after a workup, including EKGs, lab tests, x-rays and even CAT Scans (CT scans), it is still possible to have a serious problem that cannot be detected at first.    The physician caring for you feels that the cause of your shortness of breath is not from a life-threatening problem.    Some of the more dangerous medical problems such as a heart attack, blood clot in the lung (pulmonary embolism), asthma, collapsed lung, heart failure, etc. have been looked at, but are not felt to be the cause of your shortness of breath.    Shortness of breath is a serious symptom and must be handled carefully. It is VERY IMPORTANT that you see your regular doctor and return for re-evaluation or seek medical attention immediately if your symptoms become worse or change.    YOU SHOULD SEEK MEDICAL ATTENTION IMMEDIATELY, EITHER HERE OR AT THE NEAREST EMERGENCY DEPARTMENT, IF ANY OF THE FOLLOWING OCCURS:   Your shortness of breath returns.   You notice that your shortness of breath happens as you walk, go up stairs, or exert yourself.   If you develop chest pain, sweating, or nausea (feel sick to your stomach).   Rapid heart beat.   You have any weakness, lightheadedness or you get so short of breath that you pass out.   It hurts to breathe.   Your leg swells.   Any other worsening symptoms or problems.   If you are unable to sleep because of shortness of breath, or you wake in the middle of the night short of  breath.    Inhaler Use Instructions    How to use your metered dose inhaler:   Remove the cap from the mouthpiece. Check for small objects that may be stuck in the mouthpiece.   Shake the inhaler.   Place the inhaler into position. Its best to use the inhaler with a spacer device. Using a spacer will help the medication get to your lungs. If you do not have a spacer, your doctor can prescribe one for you. Until you get a spacer, you can substitute by using a large styrofoam cup. Make a hold through the closed end of the cup. Put the inhaler through the hole. Cover your mouth and nose with the open end of the cup. Hold the cup firmly in place. If you do not have a spacer or a styrofoam cup, try placing the inhaler a few inches from your mouth (not in your mouth). If you cannot do this, place your lips around the mouthpiece of the inhaler.    Push down on the canister to release a dose of the medication.   While you press down, take a slow deep breath. Hold your breath for 10 seconds or more.   Remove the inhaler from your mouth and exhale (breathe out).   Wait about one minute before taking a  second dose.    Nasal Congestion    You have been seen for nasal congestion.    Nasal congestion is another term for a "stuffy nose." Some people may feel congested from having mucus in their nose, such as when they have a cold. True congestion however, happens when the membranes lining the nose become swollen and inflamed. Nasal congestion can have many causes, including colds, sinus infections, allergies and other irritants such as pollution or dry air. Also, overuse of some nasal sprays or drops may cause congestion.    Congestion may cause ear problems such as pain or pressure, difficulty breathing or trouble sleeping. Mostly, a stuffy nose is just annoying!    Most cases of nasal congestion are due to a virus and will go away with time. Here are some other ways to help your symptoms:   Elevate your head while  sleeping; congestion is often worse when lying down.   Use a saline (salt water) nasal spray to moisten the nostrils.   Increase the humidity in the air with a humidifier or vaporizer.   Be sure to drink plenty of fluids to keep you well-hydrated.    Some other treatments for congestion include decongestants and antihistamines. Decongestant sprays and drops should never be used for more than 3 days because they can actually make the congestion worse! Many antihistamines may also make you sleepy; you should never drive a car or operate machinery while taking these medications.    You have been given a prescription for your nasal congestion. Use the medication as prescribed.    YOU SHOULD SEEK MEDICAL ATTENTION IMMEDIATELY, EITHER HERE OR AT THE NEAREST EMERGENCY DEPARTMENT, IF ANY OF THE FOLLOWING OCCURS:   Your symptoms are worsening or not getting better after 2 weeks.   You develop swelling of the face or around the eyes.   Blurry vision or changes in vision.   Worsening pain of the face or severe headaches.   Difficulty breathing.   Fever.   Any other concerns.

## 2012-08-16 ENCOUNTER — Emergency Department: Payer: 59

## 2012-08-16 ENCOUNTER — Emergency Department
Admission: EM | Admit: 2012-08-16 | Discharge: 2012-08-16 | Disposition: A | Discharge status: Home / Self Care | Payer: 59 | Attending: Emergency Medicine | Admitting: Emergency Medicine

## 2012-08-16 ENCOUNTER — Emergency Department: Payer: PRIVATE HEALTH INSURANCE

## 2012-08-16 DIAGNOSIS — J45909 Unspecified asthma, uncomplicated: Secondary | ICD-10-CM | POA: Insufficient documentation

## 2012-08-16 DIAGNOSIS — R0602 Shortness of breath: Secondary | ICD-10-CM | POA: Insufficient documentation

## 2012-08-16 DIAGNOSIS — C9590 Leukemia, unspecified not having achieved remission: Secondary | ICD-10-CM | POA: Insufficient documentation

## 2012-08-16 LAB — CBC AND DIFFERENTIAL
Basophils Absolute Automated: 0.05 (ref 0.00–0.20)
Basophils Automated: 0 %
Eosinophils Absolute Automated: 0.01 (ref 0.00–0.70)
Eosinophils Automated: 0 %
Hematocrit: 29.7 % — ABNORMAL LOW (ref 37.0–47.0)
Hgb: 9.5 g/dL — ABNORMAL LOW (ref 12.0–16.0)
Immature Granulocytes Absolute: 0.09 — ABNORMAL HIGH
Immature Granulocytes: 1 %
Lymphocytes Absolute Automated: 0.76 (ref 0.50–4.40)
Lymphocytes Automated: 6 %
MCH: 32.4 pg — ABNORMAL HIGH (ref 28.0–32.0)
MCHC: 32 g/dL (ref 32.0–36.0)
MCV: 101.4 fL — ABNORMAL HIGH (ref 80.0–100.0)
MPV: 10.2 fL (ref 9.4–12.3)
Monocytes Absolute Automated: 0.96 (ref 0.00–1.20)
Monocytes: 7 %
Neutrophils Absolute: 12.03 — ABNORMAL HIGH (ref 1.80–8.10)
Neutrophils: 87 %
Nucleated RBC: 0 (ref 0–1)
Platelets: 245 (ref 140–400)
RBC: 2.93 — ABNORMAL LOW (ref 4.20–5.40)
RDW: 17 % — ABNORMAL HIGH (ref 12–15)
WBC: 13.81 — ABNORMAL HIGH (ref 3.50–10.80)

## 2012-08-16 LAB — HEMOLYSIS INDEX: Hemolysis Index: 5 (ref 0–18)

## 2012-08-16 LAB — BASIC METABOLIC PANEL
Anion Gap: 7 (ref 5.0–15.0)
BUN: 29 mg/dL — ABNORMAL HIGH (ref 7.0–19.0)
CO2: 28 (ref 22–29)
Calcium: 9.8 mg/dL (ref 8.5–10.5)
Chloride: 105 (ref 98–107)
Creatinine: 0.7 mg/dL (ref 0.6–1.0)
Glucose: 121 mg/dL — ABNORMAL HIGH (ref 70–100)
Potassium: 4.6 (ref 3.5–5.1)
Sodium: 140 (ref 136–145)

## 2012-08-16 LAB — TROPONIN I: Troponin I: <0.01 ng/mL

## 2012-08-16 LAB — TSH: TSH: 1.14 (ref 0.35–4.94)

## 2012-08-16 LAB — GFR: EGFR: >60

## 2012-08-16 MED ORDER — SODIUM CHLORIDE 0.9 % IV BOLUS
500.0000 mL | Freq: Once | INTRAVENOUS | Status: AC
Start: 2012-08-16 — End: 2012-08-16
  Administered 2012-08-16: 500 mL via INTRAVENOUS

## 2012-08-16 NOTE — ED Provider Notes (Signed)
EMERGENCY DEPARTMENT HISTORY AND PHYSICAL EXAM    Date: 08/16/2012  Patient Name: [name]  Attending Physician:  Kelly Splinter, MD  Diagnosis and Treatment Plan       Clinical Impression:   1. Shortness of breath [Resolved]        Treatment Plan:   ED Disposition     [facility] discharge to home/self care.    Condition at discharge: Stable            History of Presenting Illness     Chief Complaint   Patient presents with   . Fatigue       History Provided By: Patient  Chief Complaint: SOB    Additional History: [name] is a 57 y.o. female presenting with SOB and associated generalized weakness, postnasal drainage, cough, dizziness and palpitations x today. She states that her sxs started after she went for a walk, but did not resolved after laying down for 30 minutes so she came to the ER. She states that her sxs are now improving, but she's not returned to baseline.    Patient was admitted in June for 5 weeks after being dx with leukemia (started chemo during week 2 of admission). She as d/c'd on 7/15 and states that she had been doing well until 1 week ago when she was in the ER for an asthma exacerbation. She was given a neb and flonase w/o relief. The following day she saw her PCP who provided a neb in office and a prednisone prescription (20mg  QD). Again, she had been well afterwards until today      Denies fever, vomiting, new onset nausea, abd pain, sore throat, neck pain    PCP: Edwyna Shell, MD  Oncologist: Aksentijevich    No current facility-administered medications for this encounter.  Current outpatient prescriptions:predniSONE (DELTASONE) 20 MG tablet, Take 20 mg by mouth daily., Disp: , Rfl:     Past Medical History     Past Medical History   Diagnosis Date   . Asthma without status asthmaticus    . Leukemia      Past Surgical History   Procedure Date   . Hand surgery        Family History     History reviewed. No pertinent family history.    Social History      History     Social History   . Marital Status: Single     Spouse Name: N/A     Number of Children: N/A   . Years of Education: N/A     Social History Main Topics   . Smoking status: Never Smoker    . Smokeless tobacco: Not on file   . Alcohol Use: Yes      Comment: very rarely   . Drug Use: No   . Sexually Active: Not on file     Other Topics Concern   . Not on file     Social History Narrative   . No narrative on file       Allergies     Allergies   Allergen Reactions   . Codeine Nausea Only   . Morphine Nausea And Vomiting       Review of Systems     Review of Systems   Constitutional: Negative for fever and chills.   HENT: Positive for postnasal drip. Negative for sore throat and neck pain.    Respiratory: Positive for cough and shortness of breath.  Cardiovascular: Positive for palpitations.   Gastrointestinal: Negative for nausea, vomiting and abdominal pain.   Neurological: Positive for dizziness and weakness.      No fall/injury  Patient asked and denies other acute complaints or concerns.    Physical Exam     BP 120/58  Pulse 83  Temp 98.7 F (37.1 C) (Oral)  Resp 18  Ht 1.524 m  Wt 48.988 kg  BMI 21.09 kg/m2  SpO2 99%  Pulse Oximetry Analysis - Normal 96% On RA    Physical Exam   Nursing note and vitals reviewed.  Constitutional: She is oriented to person, place, and time. She appears well-developed. No distress.        Thin, slightly dizzy with standing with transient hr >100 with standing   HENT:   Head: Normocephalic and atraumatic.        mmm   Eyes: Conjunctivae normal and EOM are normal. Pupils are equal, round, and reactive to light. No scleral icterus.   Neck: Normal range of motion. Neck supple. No tracheal deviation present.   Cardiovascular: Normal rate, regular rhythm and intact distal pulses.    Pulmonary/Chest: Effort normal. No stridor. No respiratory distress. She has no wheezes. She has no rales.        picc right chest wall with c/d/i dressing, non-tender   Abdominal: Soft.  She exhibits no distension. There is no tenderness. There is no rebound and no guarding.   Musculoskeletal: Normal range of motion. She exhibits no edema.   Neurological: She is alert and oriented to person, place, and time.        Normal gait, normal tone, clear and fluent speech   Skin: Skin is warm and dry. She is not diaphoretic.   Psychiatric: She has a normal mood and affect. Her behavior is normal. Judgment and thought content normal.         Diagnostic Study Results     Labs -     Results     Procedure Component Value Units Date/Time    TSH [[id number]] Collected:08/16/12 1707    Specimen Information:Blood Updated:08/16/12 1753     Thyroid Stimulating Hormone 1.14     Troponin I [[id number]] Collected:08/16/12 1707    Specimen Information:Blood Updated:08/16/12 1740     Troponin I <0.01 ng/mL     Basic Metabolic Panel (BMP) [[id number]]  (Abnormal) Collected:08/16/12 1707    Specimen Information:Blood Updated:08/16/12 1731     Glucose 121 (H) mg/dL      BUN 29.5 (H) mg/dL      Creatinine 0.7 mg/dL      CALCIUM 9.8 mg/dL      Sodium 284      Potassium 4.6      Chloride 105      CO2 28      Anion Gap 7.0     HEMOLYZED INDEX [[id number]] Collected:08/16/12 1707     Hemolyzed Index 5 Updated:08/16/12 1731    GFR [[id number]] Collected:08/16/12 1707     EGFR >60.0 Updated:08/16/12 1731    CBC and differential [[id number]]  (Abnormal) Collected:08/16/12 1707    Specimen Information:Blood / Blood Updated:08/16/12 1720     WBC 13.81 (H)      RBC 2.93 (L)      Hgb 9.5 (L) g/dL      Hematocrit 13.2 (L) %      MCV 101.4 (H) fL      MCH 32.4 (H) pg      MCHC 32.0 g/dL  RDW 17 (H) %      Platelets 245      MPV 10.2 fL      Neutrophils 87 %      Lymphocytes Automated 6 %      Monocytes 7 %      Eosinophils Automated 0 %      Basophils Automated 0 %      Immature Granulocyte 1 %      Nucleated RBC 0      Neutrophils Absolute 12.03 (H)      Abs Lymph Automated 0.76      Abs Mono Automated 0.96      Abs Eos Automated 0.01       Absolute Baso Automated 0.05      Absolute Immature Granulocyte 0.09 (H)           Radiologic Studies -   Radiology Results (24 Hour)     Procedure Component Value Units Date/Time    Chest 2 Views [[id number]] Collected:08/16/12 1819    Order Status:Completed  Updated:08/16/12 1824    Narrative:    HISTORY: Dyspnea    EXAMINATION: PA and lateral views of chest     COMPARISON: 07/21/2012    FINDINGS: Redemonstration of right internal jugular central venous  access device with tip overlying the SVC without pneumothorax. No focal  consolidation or effusion. Cardiac silhouette not enlarged. Pleural  surfaces and osseous structures are intact.      Impression:     No radiographic evidence of acute cardiopulmonary process.    Ivin Poot, MD   08/16/2012 6:20 PM      .    Doctor's Notes     Throughout the stay in the Emergency Department, questions and concerns surrounding pain control, care plans, diagnostic studies, effects of medications administered or prescribed, and future prognostic dilemmas were assessed and addressed.    ROS addendum: The patient and/or family was asked if they had any other complaints or concerns that we could address today and nothing of significance was noted.    EKG: sinus, rate 90, no ectopy/ischemia/infarct, possible LAE, normal intervals     IMP & PLAN: possible dehydration, possible anemia, less likely cardiac arrhythmia/acs, will screen electrolytes, ekg/keep on monitor, check cbc, and ivf, no wheezing so will monitor for same  6:28 PM  Pt is feeling better and would like to go home. Patient ambulating without difficulty, but w/ trace dizziness. Otherwise feels well.  Discussed test results with pt and counseled on diagnosis, f/u plans, and signs and symptoms when to return to ED.  Pt is stable and ready for discharge.    6:51 PM  - patient made aware that she'd need to have her WBC rechecked when she's off steroids, it is slightly elevated now which may represent the steroids but given  her leukemia she will need repeat wbc to ensure no relapse  Lungs still ctab.  No indication for inhaler though during the events that happened earlier today it may have helped. Patient knows to at least try the inhaler for any future episodes to see if it helps. Reasons to return and f/u carefully reviewed and two copies of results provided since we wrote all over one to help explain results to her.  _______________________________  Medical DeMedical Decision Makingcision Making  Attestations:     Physician/Midlevel provider first contact with patient: 08/16/12 1626         This note is prepared for Kelly Splinter, MD. The scribe's documentation has been  prepared under my direction and personally reviewed by me in its entirety.  I confirm that the note above accurately reflects all work, treatment, procedures, and medical decision making performed by me.     I am the first provider for this patient.      Kelly Splinter, MD is the primary emergency doctor of record.      I reviewed the vital signs, available nursing notes, past medical history, past surgical history, family history and social history.    _______________________________                Westley Foots, MD  08/19/12 [phone]

## 2012-08-16 NOTE — ED Notes (Signed)
Pt was seen here last week for sob. Pt given breathing treatment and sent home. Pt followed up with PCP and was given another breathing treatment and prednisone. Pt has felt much better with the prednisone. Pt today felt weak after taking a walk then felt her heart racing while laying in bed.

## 2012-08-16 NOTE — Discharge Instructions (Signed)
Shortness of Breath, Unclear Etiology    You have been seen today for shortness of breath, or difficulty breathing; but we don't know the cause.    This means that after talking about your medical problems, doing a physical exam, and looking at the tests that were done, there is still not a clear explanation as to why you are short of breath. You may need to go see your doctor for another exam or more tests to find out why you are short of breath. Marland Kitchen    CAUTION: Even after a workup, including EKGs, lab tests, x-rays and even CAT Scans (CT scans), it is still possible to have a serious problem that cannot be detected at first.    The physician caring for you feels that the cause of your shortness of breath is not from a life-threatening problem.    Some of the more dangerous medical problems such as a heart attack, blood clot in the lung (pulmonary embolism), asthma, collapsed lung, heart failure, etc. have been looked at, but are not felt to be the cause of your shortness of breath.    Shortness of breath is a serious symptom and must be handled carefully. It is VERY IMPORTANT that you see your regular doctor and return for re-evaluation or seek medical attention immediately if your symptoms become worse or change.    YOU SHOULD SEEK MEDICAL ATTENTION IMMEDIATELY, EITHER HERE OR AT THE NEAREST EMERGENCY DEPARTMENT, IF ANY OF THE FOLLOWING OCCURS:   Your shortness of breath returns.   You notice that your shortness of breath happens as you walk, go up stairs, or exert yourself.   If you develop chest pain, sweating, or nausea (feel sick to your stomach).   Rapid heart beat.   You have any weakness, lightheadedness or you get so short of breath that you pass out.   It hurts to breathe.   Your leg swells.   Any other worsening symptoms or problems.   If you are unable to sleep because of shortness of breath, or you wake in the middle of the night short of breath.    Dehydration    You have been  diagnosed with dehydration.    Dehydration occurs when your body is low on fluids. Dehydration can come from a variety of causes ranging from vomiting and diarrhea, to excessive sweating and poor appetite.    You have received intravenous (IV) fluids to help correct your dehydration. It is important that you continue hydrating yourself at home. This can be done by drinking plenty of fluids on a frequent basis. Drink fluids that won t upset your stomach including water, juice or drinks such as Gatorade. Avoid beverages like soda pop and tea as they may make you worse. Avoid caffeine and alcohol as these may cause you to lose more fluid.    YOU SHOULD SEEK MEDICAL ATTENTION IMMEDIATELY, EITHER HERE OR AT THE NEAREST EMERGENCY DEPARTMENT, IF ANY OF THE FOLLOWING OCCURS:   Fever (temperature of over 100.5 F.) or shaking chills.   Persistent vomiting and/or diarrhea.   Lightheadedness or fainting.   If you do not urinate for 8 or more hours.      PLEASE DRINK LOTS OF FLUIDS OVER THE NEXT SEVERAL DAYS TO ENSURE HYDRATION

## 2012-08-17 LAB — ECG 12-LEAD
Atrial Rate: 90 {beats}/min
P Axis: 68 degrees
P-R Interval: 130 ms
Q-T Interval: 366 ms
QRS Duration: 76 ms
QTC Calculation (Bezet): 447 ms
R Axis: 57 degrees
T Axis: 56 degrees
Ventricular Rate: 90 {beats}/min

## 2012-08-18 ENCOUNTER — Emergency Department
Admission: EM | Admit: 2012-08-18 | Discharge: 2012-08-18 | Disposition: A | Discharge status: Home / Self Care | Payer: 59 | Attending: Emergency Medicine | Admitting: Emergency Medicine

## 2012-08-18 ENCOUNTER — Emergency Department: Payer: PRIVATE HEALTH INSURANCE

## 2012-08-18 DIAGNOSIS — Z79899 Other long term (current) drug therapy: Secondary | ICD-10-CM | POA: Insufficient documentation

## 2012-08-18 DIAGNOSIS — C9590 Leukemia, unspecified not having achieved remission: Secondary | ICD-10-CM | POA: Insufficient documentation

## 2012-08-18 DIAGNOSIS — R5381 Other malaise: Secondary | ICD-10-CM | POA: Insufficient documentation

## 2012-08-18 DIAGNOSIS — D649 Anemia, unspecified: Secondary | ICD-10-CM | POA: Insufficient documentation

## 2012-08-18 DIAGNOSIS — R5383 Other fatigue: Secondary | ICD-10-CM | POA: Insufficient documentation

## 2012-08-18 DIAGNOSIS — J45909 Unspecified asthma, uncomplicated: Secondary | ICD-10-CM | POA: Insufficient documentation

## 2012-08-18 LAB — COMPREHENSIVE METABOLIC PANEL
ALT: 29 U/L (ref 0–55)
AST (SGOT): 17 U/L (ref 5–34)
Albumin/Globulin Ratio: 1.2 (ref 0.9–2.2)
Albumin: 3.5 g/dL (ref 3.5–5.0)
Alkaline Phosphatase: 83 U/L (ref 40–150)
Anion Gap: 6 (ref 5.0–15.0)
BUN: 19 mg/dL (ref 7.0–19.0)
Bilirubin, Total: 0.3 mg/dL (ref 0.2–1.2)
CO2: 29 (ref 22–29)
Calcium: 9.7 mg/dL (ref 8.5–10.5)
Chloride: 107 (ref 98–107)
Creatinine: 0.7 mg/dL (ref 0.6–1.0)
Globulin: 2.9 g/dL (ref 2.0–3.6)
Glucose: 140 mg/dL — ABNORMAL HIGH (ref 70–100)
Potassium: 4.4 (ref 3.5–5.1)
Protein, Total: 6.4 g/dL (ref 6.0–8.3)
Sodium: 142 (ref 136–145)

## 2012-08-18 LAB — TROPONIN I: Troponin I: <0.01 ng/mL

## 2012-08-18 LAB — CBC AND DIFFERENTIAL
Basophils Absolute Automated: 0.07 (ref 0.00–0.20)
Basophils Automated: 1 %
Eosinophils Absolute Automated: 0.07 (ref 0.00–0.70)
Eosinophils Automated: 1 %
Hematocrit: 32.7 % — ABNORMAL LOW (ref 37.0–47.0)
Hgb: 10.4 g/dL — ABNORMAL LOW (ref 12.0–16.0)
Immature Granulocytes Absolute: 0.06 — ABNORMAL HIGH
Immature Granulocytes: 1 %
Lymphocytes Absolute Automated: 0.68 (ref 0.50–4.40)
Lymphocytes Automated: 5 %
MCH: 32.3 pg — ABNORMAL HIGH (ref 28.0–32.0)
MCHC: 31.8 g/dL — ABNORMAL LOW (ref 32.0–36.0)
MCV: 101.6 fL — ABNORMAL HIGH (ref 80.0–100.0)
MPV: 10.3 fL (ref 9.4–12.3)
Monocytes Absolute Automated: 0.72 (ref 0.00–1.20)
Monocytes: 6 %
Neutrophils Absolute: 11.16 — ABNORMAL HIGH (ref 1.80–8.10)
Neutrophils: 88 %
Nucleated RBC: 0 (ref 0–1)
Platelets: 216 (ref 140–400)
RBC: 3.22 — ABNORMAL LOW (ref 4.20–5.40)
RDW: 17 % — ABNORMAL HIGH (ref 12–15)
WBC: 12.7 — ABNORMAL HIGH (ref 3.50–10.80)

## 2012-08-18 LAB — URINALYSIS, REFLEX TO MICROSCOPIC EXAM IF INDICATED
Bilirubin, UA: NEGATIVE
Blood, UA: NEGATIVE
Glucose, UA: NEGATIVE
Ketones UA: NEGATIVE
Leukocyte Esterase, UA: NEGATIVE
Nitrite, UA: NEGATIVE
Protein, UR: NEGATIVE
Specific Gravity UA: 1.006 (ref 1.001–1.035)
Urine pH: 8 (ref 5.0–8.0)
Urobilinogen, UA: NEGATIVE mg/dL

## 2012-08-18 LAB — GFR: EGFR: >60

## 2012-08-18 LAB — HEMOLYSIS INDEX: Hemolysis Index: 5 (ref 0–18)

## 2012-08-18 LAB — TSH: TSH: 1.61 (ref 0.35–4.94)

## 2012-08-18 MED ORDER — SODIUM CHLORIDE 0.9 % IV BOLUS
500.00 mL | Freq: Once | INTRAVENOUS | Status: AC
Start: 2012-08-18 — End: 2012-08-18
  Administered 2012-08-18: 500 mL via INTRAVENOUS

## 2012-08-18 NOTE — ED Notes (Addendum)
Patient c/o generalized weakness since walking (exercise) this morning.  States she has been drinking lots of water but feels worse. Patient states she completed chemotherapy for lukemia 5 weeks ago.

## 2012-08-18 NOTE — Discharge Instructions (Signed)
Drink plenty of fluids. Lie down if you feel lightheaded. Rest.    See Dr. Waldo Laine for follow up in 2-3 days.    Return to the ED for any fevers, chest pain, trouble breathing or any concerns.

## 2012-08-18 NOTE — ED Provider Notes (Signed)
EMERGENCY DEPARTMENT HISTORY AND PHYSICAL EXAM     Physician/Midlevel provider first contact with patient: 08/18/12 1313         Date: 08/18/2012  Patient Name: [name]    History of Presenting Illness     Chief Complaint   Patient presents with   . Generalized weakness       History Provided By: patient    Chief Complaint: weakness  Onset: 3 days ago   Timing: intermittent  Location: generalized  Quality: generally weak  Severity: moderate  Modifying Factors: worse with physical activity   Associated Symptoms: dizziness (lightheaded)    Additional History: [name] is a 57 y.o. female with hx of leukemia (currently on chemo) presenting to the ED due to generalized weakness which has been ongoing intermittently x a few days. Current episode of weakness and dizziness began shortly after pt finished a walk this AM and had a meal afterwards. Prior to today, the last episode of the weakness occurred 3 days ago shortly after some physical activity. The pt sts she has been told she's anemic and believes she may be pushing herself too far in her workouts which may be contributing to sxs.    The pt denies any fevers, cough, chest pain, SOB, LOC, abdominal pain, back pain, neck pain. The pt is currently on prednisone for treatment of SOB which began a couple of weeks ago. The pt denies any urinary or BM changes. The pt has no other complaints currently.     Oncology: Dr. Ambrose Finland  PCP: Edwyna Shell, MD      Current Facility-Administered Medications   Medication Dose Route Frequency Provider Last Rate Last Dose   . [COMPLETED] sodium chloride 0.9 % bolus 500 mL  500 mL Intravenous Once Marquette Old, MD   500 mL at 08/18/12 1327     Current Outpatient Prescriptions   Medication Sig Dispense Refill   . predniSONE (DELTASONE) 20 MG tablet Take 20 mg by mouth daily.           Past History     Past Medical History:  Past Medical History   Diagnosis Date   . Asthma without status asthmaticus    .  Leukemia        Past Surgical History:  Past Surgical History   Procedure Date   . Hand surgery        Family History:  History reviewed. No pertinent family history.    Social History:  History   Substance Use Topics   . Smoking status: Never Smoker    . Smokeless tobacco: Not on file   . Alcohol Use: Yes      Comment: very rarely       Allergies:  Allergies   Allergen Reactions   . Codeine Nausea Only   . Morphine Nausea And Vomiting       Review of Systems     Review of Systems   Constitutional: Negative for fever and chills.   HENT: Negative for neck pain.    Respiratory: Negative for cough and shortness of breath.    Cardiovascular: Negative for chest pain.   Gastrointestinal: Negative for nausea, vomiting and abdominal pain.   Musculoskeletal: Negative for back pain and falls.   Neurological: Positive for dizziness (lightheaded) and weakness. Negative for loss of consciousness.   All other systems reviewed and are negative.          Physical Exam   BP 115/54  Pulse 81  Temp 98.4 F (36.9 C) (Oral)  Resp 16  Ht 1.651 m  Wt 48.988 kg  BMI 17.97 kg/m2  SpO2 100%    Physical Exam   Constitutional: Patient is oriented to person, place, and time and well-developed, well-nourished, and in no distress.   Head: Normocephalic and atraumatic.   Eyes: EOM are normal. Pupils are equal, round, and reactive to light. pink sunconj. No scleral icterus  ENT: OP clear, sli dry MM  Neck: Normal range of motion. Neck supple. No JVD. No thryomeg  Cardiovascular: Normal rate and regular rhythm. No murmurs or rubs.  Pulmonary/Chest: Effort normal and breath sounds normal. No respiratory distress.   Abdominal: Soft. There is no tenderness. No rebound or guarding.  Musculoskeletal: Normal range of motion. No lower extremity edema, no calf cords or TTP  Neurological: Patient is alert and oriented to person, place, and time. GCS score is 15. MAE. Cn 2-12 intact  Skin: Skin is warm and dry. R chest IV site c/d/i        Diagnostic  Study Results     Labs -     Results     Procedure Component Value Units Date/Time    Comprehensive Metabolic Panel (CMP) [[id number]]  (Abnormal) Collected:08/18/12 1319    Specimen Information:Blood Updated:08/18/12 1353     Glucose 140 (H) mg/dL      BUN 40.9 mg/dL      Creatinine 0.7 mg/dL      Sodium 811      Potassium 4.4      Chloride 107      CO2 29      CALCIUM 9.7 mg/dL      Protein, Total 6.4 g/dL      Albumin 3.5 g/dL      AST (SGOT) 17 U/L      ALT 29 U/L      Alkaline Phosphatase 83 U/L      Bilirubin, Total 0.3 mg/dL      Globulin 2.9 g/dL      Albumin/Globulin Ratio 1.2      Anion Gap 6.0     HEMOLYZED INDEX [[id number]] Collected:08/18/12 1319     Hemolyzed Index 5 Updated:08/18/12 1353    GFR [[id number]] Collected:08/18/12 1319     EGFR >60.0 Updated:08/18/12 1353    UA, Reflex to Microscopic [[id number]] Collected:08/18/12 1319    Specimen Information:Urine Updated:08/18/12 1341     Urine Type Clean Catch      Color, UA Colorless      Clarity, UA Clear      Specific Gravity UA 1.006      Urine pH 8.0      Leukocyte Esterase, UA Negative      Nitrite, UA Negative      Protein, UA Negative      Glucose, UA Negative      Ketones UA Negative      Urobilinogen, UA Negative mg/dL      Bilirubin, UA Negative      Blood, UA Negative     CBC and differential [[id number]]  (Abnormal) Collected:08/18/12 1319    Specimen Information:Blood / Blood Updated:08/18/12 1338     WBC 12.70 (H)      RBC 3.22 (L)      Hgb 10.4 (L) g/dL      Hematocrit 46.9 (L) %      MCV 101.6 (H) fL      MCH 32.3 (H) pg      MCHC 31.8 (L) g/dL  RDW 17 (H) %      Platelets 216      MPV 10.3 fL      Neutrophils 88 %      Lymphocytes Automated 5 %      Monocytes 6 %      Eosinophils Automated 1 %      Basophils Automated 1 %      Immature Granulocyte 1 %      Nucleated RBC 0      Neutrophils Absolute 11.16 (H)      Abs Lymph Automated 0.68      Abs Mono Automated 0.72      Abs Eos Automated 0.07      Absolute Baso Automated 0.07       Absolute Immature Granulocyte 0.06 (H)     TSH [[id number]] Collected:08/18/12 1319    Specimen Information:Blood Updated:08/18/12 1336    Troponin I [[id number]] Collected:08/18/12 1319    Specimen Information:Blood Updated:08/18/12 1336          Radiologic Studies -   Radiology Results (24 Hour)     ** No Results found for the last 24 hours. **      .      Medical Decision Making   I am the first provider for this patient.    I reviewed the vital signs, available nursing notes, past medical history, past surgical history, family history and social history.    Vital Signs-Reviewed the patient's vital signs.     Patient Vitals for the past 12 hrs:   BP Temp Pulse Resp   08/18/12 1327 115/54 mmHg 98.4 F (36.9 C) 81  16    08/18/12 1258 119/56 mmHg 98.4 F (36.9 C) 88  16        Pulse Oximetry Analysis - Normal 97% on RA    Ecg: nsr at 79 bpm, no ST elev, depressions or TWI    ED Course:   2:13 PM - Discussed with Dr. Waldo Laine (pt's PCP), agrees with plan and will follow up with pt. Pt was admitted to [facility] recently for possible leukemia but does not think that's what it was. Believes that pt may have gotten overheated from overexertion.     2:22 PM - Discussed results, treatment plan and return precautions with pt.     Provider Notes: generalized weakness and lightheadedness, likely 2/2 mild dehydration after walking outside. H/h stable without si/sx active bleeding. No si/sx infection, ACS      Diagnosis     Clinical Impression:   1. Chronic anemia    2. General weakness        _______________________________    Attestations:  This note is prepared by Adelina Mings, acting as Scribe for Marquette Old, MD.     Dr. Dory Peru: The scribe's documentation has been prepared under my direction and personally reviewed by me in its entirety.  I confirm that the note above accurately reflects all work, treatment, procedures, and medical decision making performed by me.    _______________________________          Marquette Old,  MD  08/28/12 [phone]

## 2012-08-18 NOTE — ED Notes (Signed)
Discharged by dr chung

## 2012-08-20 LAB — ECG 12-LEAD
Atrial Rate: 79 {beats}/min
P Axis: 63 degrees
P-R Interval: 128 ms
Q-T Interval: 390 ms
QRS Duration: 78 ms
QTC Calculation (Bezet): 447 ms
R Axis: 43 degrees
T Axis: 48 degrees
Ventricular Rate: 79 {beats}/min

## 2012-10-01 ENCOUNTER — Other Ambulatory Visit: Payer: 59

## 2012-10-01 MED ORDER — ACETAMINOPHEN 325 MG PO TABS
650.0000 mg | ORAL_TABLET | ORAL | Status: AC
Start: 2012-10-02 — End: 2012-10-02

## 2012-10-01 MED ORDER — DIPHENHYDRAMINE HCL 50 MG/ML IJ SOLN
25.0000 mg | INTRAMUSCULAR | Status: AC
Start: 2012-10-02 — End: 2012-10-02

## 2012-10-01 MED ORDER — DIPHENHYDRAMINE HCL 25 MG PO CAPS
25.0000 mg | ORAL_CAPSULE | ORAL | Status: AC
Start: 2012-10-02 — End: 2012-10-02

## 2012-10-01 NOTE — Progress Notes (Signed)
Initial Social Work Assessment- [facility]    Cancer Diagnosis:   Leukemia    Prior treatment: chemo  Distress Screen Score: N/A infusion patient    Past Medical History:   Asthma    Reported Mental Health Concerns:   The patient has been experiencing appropriate situational anxiety since her diagnosis.  No previous mental health concerns were reported. The patient has a positive attitude and takes one day at a time.        Social Supports:   The patient is single and lives alone.  She stated that she has been surprised at the lack of support from some of her her friends.  The patient is from [place] and has had several friends visit from there.  The friend who was present will be in town for a few more days.  The patient says that she has good support from her church and one member, who is a survivor of leukemia and breast cancer, has been helpful.     Reported Financial Concerns:    The patient was in the hospital for five weeks and her insurance denied the claim due to an error in coding her diagnosis.  The patient is waiting to learn of her portion of the bills once the billing error is cleared up with insurance.  The patient stated that her oncology office assisted in enrolling the patient for a program to assist with prescription drug coverage for one of the medications that she is prescribed.       Services Provided:      The patient is scheduled [facility] an [facility] and came by the [facility] on the way to the outpatient lab. Empathic support provided throughout assessment of patient history.  The patient reported that her main concern was with nutrition, as she lost a significant amount of weight while in the hospital and has been having trouble gaining it back. SW connected the patient with the dietician.      Follow Up:     No follow up with social work requested, at this time.  This worker provided contact information  should the patient have needs in the future.        -Darrold Span, MSW  Oncology Social Worker   [facility]  [phone] (Direct)

## 2012-10-02 ENCOUNTER — Ambulatory Visit
Admission: RE | Admit: 2012-10-02 | Discharge: 2012-10-02 | Disposition: A | Discharge status: Home / Self Care | Payer: 59 | Source: Ambulatory Visit | Attending: Hematology & Oncology | Admitting: Hematology & Oncology

## 2012-10-02 VITALS — BP 114/52 | HR 80 | Temp 96.3°F | Resp 18 | Ht 65.0 in | Wt 110.4 lb

## 2012-10-02 DIAGNOSIS — C9200 Acute myeloblastic leukemia, not having achieved remission: Secondary | ICD-10-CM | POA: Insufficient documentation

## 2012-10-02 LAB — PREPARE PLATELETS: Platelet Product-: TRANSFUSED

## 2012-10-02 MED ORDER — ACETAMINOPHEN 325 MG PO TABS
ORAL_TABLET | ORAL | Status: AC
Start: 2012-10-02 — End: 2012-10-02
  Administered 2012-10-02: 650 mg via ORAL
  Filled 2012-10-02: qty 2

## 2012-10-02 MED ORDER — DIPHENHYDRAMINE HCL 25 MG PO CAPS
ORAL_CAPSULE | ORAL | Status: AC
Start: 2012-10-02 — End: 2012-10-02
  Administered 2012-10-02: 25 mg via ORAL
  Filled 2012-10-02: qty 1

## 2012-10-02 NOTE — Discharge Instructions (Signed)
Platelet Count  Also known as:Thrombocyte count; PLT  Formal name:Platelet Count  Related tests:CBC; Platelet aggregation  At a Glance  Why Get Tested?  To diagnose ableeding disorderor abone marrow disease.  When to Get Tested?  As part of a routinecomplete blood count (CBC), during episodes of unexplained or prolonged bleeding, or to diagnose/monitor a bone marrow/blood disease.  Sample Required?  A blood sample drawn from a vein in your arm.  Test Preparation Needed?  None.  The Test Sample  What is being tested?  Platelets (thrombocytes) are tiny fragments of cells calledmegakaryocytesthat are made in thebone marrow. These fragments (about 2-3 microns in diameter) are released from the bone marrow to circulate in the blood. They are the first components to be activated when there has been an injury to a blood vessel or tissue. Because they are very "sticky," they begin the formation of a blood clot. The platelet count is a test that determines the number of platelets in your blood.  How is the sample collected for testing?  A blood sample is drawn though a needle placed in a vein in the arm.  Is any test preparation needed to ensure the quality of the sample?  No test preparation is needed.  The Test  How is it used?  Bleeding disordersorbone marrow diseases, such asleukemia, require the determination of the number of platelets present and/or their ability to function correctly.  When is it ordered?  A platelet count is often ordered as a part of acomplete blood count, which may be done at an annual physical examination. It is almost always ordered when a patient has unexplained bruises or takes what appears to be an unusually long time to stop bleeding from a small cut or wound.  What does the test result mean?  In an adult, a normal count is about 150,000 to 450,000 platelets per microliter of blood.  If platelet levels fall below 20,000 per microliter, spontaneous bleeding may occur and is  considered a life-threatening risk. Patients who have abone marrow disease, such asleukemiaor another cancer in the bone marrow, often experience excessive bleeding due to a significantly decreased number of platelets (thrombocytopenia). As the number of cancer cells increases in the bone marrow, normal bone marrow cells are crowded out, resulting in fewer platelet-producing cells.  Low number of platelets may be seen in some patients with long-term bleeding problems (e.g.,chronicbleeding stomach ulcers), thus reducing the supply of platelets. Decreased platelet counts may also be seen in patients with Gram-negativesepsis.  Individuals with anautoimmune disorder(such aslupusor idiopathic thrombocytopenia purpura (ITP), where the body's immune system createsantibodiesthat attack its own organs) can cause the destruction of platelets.  Certain drugs, such as acetaminophen, quinidine, sulfa drugs, digoxin, vancomycin, valium, and nitroglycerine, are just a few that have been associated with drug-induced decreased platelet counts. Patients undergoing chemotherapy or radiation therapy may also have a decreased platelet count. Up to 5% of pregnant women may experience thrombocytopenia at term.  Platelet consumption may be observed inrenal diseases. Thrombocytopenic purpura (TTP) and hemolytic uremic syndrome (HUS) are seen in renal failure and can result in fewer circulating platelets in the blood. Similarly, a condition known as splenic sequestration, where platelets pool within the spleen, can also cause a platelet decrease.  More commonly (up to 1% of the population), easy bruising or bleeding may be due to an inherited disease called von Willebrand disease. While the platelets may be normal in number, their ability to stick together is impaired due to a decrease   invon Willebrand's factor, a protein needed to initiate the clotting process. Many cases may go undiagnosed due to the mild nature of the  disease. Many cases are discovered when a patient has to have surgery or a tooth extraction or when delivering a baby. However, some cases are more severe and can be aggravated by use of certain drugs, resulting in a life-threatening situation.  Increased platelet counts (thrombocytosis) may be seen in individuals who show no significant medical problems, while others may have a more significant blood problem calledmyeloproliferative disorder. Some, although they have an increased number of platelets, may have a tendency to bleed due to the lack of stickiness of the platelets; in others, the platelets retain their stickiness but, because they are increased in number, tend to stick to each other, forming clumps that can block a blood vessel and cause damage, including death (thromboembolism).  Is there anything else I should know?  Living in high altitudes, strenuous exercise, and being post partum may cause increased platelet levels. Drugs that may cause increased platelet levels include estrogen and oral contraceptives.  Decreased levels may be seen in women before menstruation.  Other inherited disorders caused by defective platelets or decreased/absent proteins that activate the platelets include Glanzmann thrombasthenia, Bernard-Soulier disease, Chediak-Higashi syndrome, Wiskott-Aldrich syndrome, May-Hegglin syndrome, andDown syndrome. The occurrence of these genetic abnormalities, however, is relatively rare.  Common Questions  What do I need to be aware of concerning my platelets if I am having surgery?  Platelets survive about 8-10 days. If you ingest any aspirin products within this period before surgery, platelet function may be diminished, resulting in excessive bleeding. Consequently, surgery may be canceled.  Are there signs or symptoms of high or low platelet levels that I should pay attention to?  Bruising for no apparent reason, bleeding from the nose, mouth, or rectum also without obvious injury,  excessive or prolonged menstrual periods, or the inability to stop a small wound from bleeding within a reasonable period of time may indicate a platelet deficiency.  How stable are platelet counts?  Platelet counts can vary throughout the course of the day; however, counts are highest at midday. Under certain conditions, platelets may clump and falsely appear to be low in number.  Content last reviewed in March 2008.  This page was last modified in August 2011.    2001-2011 [employer]

## 2012-10-02 NOTE — Addendum Note (Signed)
Encounter addended by: Birdie Hopes, RN on: 10/02/2012  1:53 PM<BR>     Documentation filed: Inpatient Patient Education, Inpatient Document Flowsheet, Visit Diagnoses, Discharge Instructions, Inpatient Discharge Instruction Writer, Notes Section

## 2012-10-02 NOTE — Progress Notes (Signed)
Plan of Care/Outcome List    ANEMIA    Diagnosis__AML_____________________________________________________________        Date Initiated  Initials # Discharge Outcome      Date__9_/_19/2014__    Initials ______YH__  Patient will receive red blood cell transfusion without harm.  Patient will discuss signs and symptoms of anemia and energy conservation measure to cope with the side effects of anemia.    Plan of Care:    Assess pretransfusion Hgb and Hct level and document___H/H 10.0/31.3 and [place] 13 ______________    Assess or initiate peripheral IV site or assess or access central venous access device per standard of care.    Safely administer red blood cell transfusion according to standards of practice and transfusion policy.    Monitor for any side effects related to transfusion including fluid overload and transfusion reaction    Teach patient/significant other signs and symptoms of anemia including: pallor; tachycardia; dyspnea; fatigue; and headache    Teach patient/significant other measures to cope with anemia and energy conservation strategies.

## 2012-10-02 NOTE — Progress Notes (Signed)
Pt arrived for transfusion of one unit of SPD. Pt A&Ox3 VSS, denies pain or discomfort. Pt was premedicated with 650mg  of Tylenol PO  and 25mg  of PO Benadryl. Pt tolerated transfusion with out transfusireaction. Pt education reinforced.Pt encouraged to call MD /RN with any question of concerns. Pt will follow up with her Dr and  MD office will call the center if new appt needed.

## 2012-10-02 NOTE — Progress Notes (Signed)
Pt received one unit of Platelet via right chest wall [place] without problems.Pt discharged instruction given.pt discharged to home post transfusion in stable condition accompanied by her friend.

## 2012-10-04 ENCOUNTER — Inpatient Hospital Stay
Admission: EM | Admit: 2012-10-04 | Discharge: 2012-10-09 | DRG: 809 | Disposition: A | Discharge status: Home / Self Care | Payer: 59 | Attending: Critical Care Medicine | Admitting: Critical Care Medicine

## 2012-10-04 ENCOUNTER — Emergency Department: Payer: 59

## 2012-10-04 ENCOUNTER — Inpatient Hospital Stay: Payer: PRIVATE HEALTH INSURANCE | Admitting: Critical Care Medicine

## 2012-10-04 DIAGNOSIS — R5081 Fever presenting with conditions classified elsewhere: Secondary | ICD-10-CM | POA: Diagnosis present

## 2012-10-04 DIAGNOSIS — T451X5A Adverse effect of antineoplastic and immunosuppressive drugs, initial encounter: Principal | ICD-10-CM | POA: Diagnosis present

## 2012-10-04 DIAGNOSIS — K2990 Gastroduodenitis, unspecified, without bleeding: Secondary | ICD-10-CM | POA: Diagnosis present

## 2012-10-04 DIAGNOSIS — C9200 Acute myeloblastic leukemia, not having achieved remission: Secondary | ICD-10-CM | POA: Diagnosis present

## 2012-10-04 DIAGNOSIS — I959 Hypotension, unspecified: Secondary | ICD-10-CM | POA: Diagnosis present

## 2012-10-04 DIAGNOSIS — Z79899 Other long term (current) drug therapy: Secondary | ICD-10-CM

## 2012-10-04 DIAGNOSIS — R51 Headache: Secondary | ICD-10-CM | POA: Diagnosis not present

## 2012-10-04 DIAGNOSIS — J45909 Unspecified asthma, uncomplicated: Secondary | ICD-10-CM | POA: Diagnosis present

## 2012-10-04 DIAGNOSIS — E876 Hypokalemia: Secondary | ICD-10-CM | POA: Diagnosis not present

## 2012-10-04 LAB — URINALYSIS WITH MICROSCOPIC
Bilirubin, UA: NEGATIVE
Blood, UA: NEGATIVE
Glucose, UA: NEGATIVE
Ketones UA: NEGATIVE
Leukocyte Esterase, UA: NEGATIVE
Nitrite, UA: NEGATIVE
Protein, UR: NEGATIVE
Specific Gravity UA: 1.017 (ref 1.001–1.035)
Urine pH: 6 (ref 5.0–8.0)
Urobilinogen, UA: NEGATIVE mg/dL

## 2012-10-04 LAB — BASIC METABOLIC PANEL
Anion Gap: 11 (ref 5.0–15.0)
BUN: 16 mg/dL (ref 7.0–19.0)
CO2: 23 (ref 22–29)
Calcium: 9.9 mg/dL (ref 8.5–10.5)
Chloride: 104 (ref 98–107)
Creatinine: 0.7 mg/dL (ref 0.6–1.0)
Glucose: 99 mg/dL (ref 70–100)
Potassium: 4 (ref 3.5–5.1)
Sodium: 138 (ref 136–145)

## 2012-10-04 LAB — MAN DIFF ONLY
Atypical Lymphocytes %: 2 %
Atypical Lymphocytes Absolute: 0.01 — ABNORMAL HIGH
Band Neutrophils Absolute: 0.01 (ref 0.00–1.00)
Band Neutrophils: 2 %
Basophils Absolute Manual: 0 (ref 0.00–0.20)
Basophils Manual: 0 %
Eosinophils Absolute Manual: 0 (ref 0.00–0.70)
Eosinophils Manual: 0 %
Lymphocytes Absolute Manual: 0.49 — ABNORMAL LOW (ref 0.50–4.40)
Lymphocytes Manual: 94 %
Monocytes Absolute: 0.01 (ref 0.00–1.20)
Monocytes Manual: 2 %
Neutrophils Absolute Manual: 0 — ABNORMAL LOW (ref 1.80–8.10)
Nucleated RBC: 0 (ref 0–1)
Segmented Neutrophils: 0 %

## 2012-10-04 LAB — HEMOLYSIS INDEX: Hemolysis Index: 1 (ref 0–18)

## 2012-10-04 LAB — CBC AND DIFFERENTIAL
Hematocrit: 27.8 % — ABNORMAL LOW (ref 37.0–47.0)
Hgb: 9.3 g/dL — ABNORMAL LOW (ref 12.0–16.0)
MCH: 31.5 pg (ref 28.0–32.0)
MCHC: 33.5 g/dL (ref 32.0–36.0)
MCV: 94.2 fL (ref 80.0–100.0)
MPV: 11 fL (ref 9.4–12.3)
Platelets: 11 — CL (ref 140–400)
RBC: 2.95 — ABNORMAL LOW (ref 4.20–5.40)
RDW: 13 % (ref 12–15)
WBC: 0.52 — ABNORMAL LOW (ref 3.50–10.80)

## 2012-10-04 LAB — CELL MORPHOLOGY
Cell Morphology: NORMAL
Platelet Estimate: DECREASED — AB

## 2012-10-04 LAB — LACTIC ACID, PLASMA: Lactic Acid: 0.6 (ref 0.5–2.2)

## 2012-10-04 LAB — GFR: EGFR: >60

## 2012-10-04 MED ORDER — SODIUM CHLORIDE 0.9 % IV MBP
4.5000 g | Freq: Once | INTRAVENOUS | Status: AC
Start: 2012-10-04 — End: 2012-10-04
  Administered 2012-10-04: 4.5 g via INTRAVENOUS
  Filled 2012-10-04: qty 4000
  Filled 2012-10-04: qty 100

## 2012-10-04 MED ORDER — ONDANSETRON HCL 4 MG/2ML IJ SOLN
4.0000 mg | Freq: Three times a day (TID) | INTRAMUSCULAR | Status: DC | PRN
Start: 2012-10-04 — End: 2012-10-05

## 2012-10-04 MED ORDER — ACETAMINOPHEN 325 MG PO TABS
650.0000 mg | ORAL_TABLET | Freq: Four times a day (QID) | ORAL | Status: AC | PRN
Start: 2012-10-04 — End: 2012-10-05
  Administered 2012-10-05: 650 mg via ORAL
  Filled 2012-10-04: qty 2

## 2012-10-04 MED ORDER — ACETAMINOPHEN 325 MG PO TABS
650.0000 mg | ORAL_TABLET | Freq: Four times a day (QID) | ORAL | Status: AC | PRN
Start: 2012-10-04 — End: 2012-10-05
  Administered 2012-10-04 – 2012-10-05 (×3): 650 mg via ORAL
  Filled 2012-10-04 (×3): qty 2

## 2012-10-04 MED ORDER — HYDROMORPHONE HCL PF 1 MG/ML IJ SOLN
1.0000 mg | INTRAMUSCULAR | Status: AC | PRN
Start: 2012-10-04 — End: 2012-10-05

## 2012-10-04 MED ORDER — VANCOMYCIN HCL 1000 MG IV SOLR
1000.0000 mg | Freq: Once | INTRAVENOUS | Status: AC
Start: 2012-10-04 — End: 2012-10-05
  Administered 2012-10-04: 1000 mg via INTRAVENOUS
  Filled 2012-10-04: qty 1000

## 2012-10-04 MED ORDER — DEXTROSE-SODIUM CHLORIDE 5-0.45 % IV SOLN
100.0000 mL/h | INTRAVENOUS | Status: DC
Start: 2012-10-04 — End: 2012-10-05
  Administered 2012-10-04: 100 mL/h via INTRAVENOUS

## 2012-10-04 NOTE — ED Notes (Signed)
Pt with fever to 101.1 s/p neupogen shots today and yesterday and platelet transfusion on Friday; generalized weakness and fatigue since yesterday, fever today; no chest pain, SOB

## 2012-10-04 NOTE — ED Provider Notes (Signed)
EMERGENCY DEPARTMENT HISTORY AND PHYSICAL EXAM     Physician/Midlevel provider first contact with patient: 10/04/12 1818         Date: 10/04/2012  Patient Name: PRITI CONSOLI    History of Presenting Illness     Chief Complaint   Patient presents with   . Fever       History Provided By: Patient    Chief Complaint: Fever  Onset: Past few days   Timing: Constant  Location: General   Severity: Moderate  Modifying Factors: None reported   Associated Symptoms: Fatigue, Nausea, Dizziness (resolved), generalized weakness    Additional History: SHEMICA MEATH is a 57 y.o. 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 Pt had Neupogen shots today and yesterday along with a platelet transfusion on Friday.   Pt denies headache, chest pain, SOB, blurred vision, vomiting, diarrhea, numbness or any other symptoms at this time. Pt allergic to Codeine and Morphine.     PCP: Edwyna Shell, MD      Current Facility-Administered Medications   Medication Dose Route Frequency Provider Last Rate Last Dose   . piperacillin-tazobactam (ZOSYN) 4.5 g in sodium chloride 0.9 % 100 mL IVPB mini-bag plus  4.5 g Intravenous Once Lester Kinsman, MD       . vancomycin (VANCOCIN) 1,000 mg in sodium chloride 0.9 % 250 mL IVPB  1,000 mg Intravenous Once Lester Kinsman, MD         Current Outpatient Prescriptions   Medication Sig Dispense Refill   . allopurinol (ZYLOPRIM) 100 MG tablet Take 100 mg by mouth daily.       Marland Kitchen dexamethasone (MAXIDEX) 0.1 % ophthalmic suspension 1 drop 2 (two) times daily.       . fluconazole (DIFLUCAN) 100 MG tablet Take 100 mg by mouth daily.       Marland Kitchen levofloxacin (LEVAQUIN) 500 MG tablet Take 500 mg by mouth daily.       . prochlorperazine (COMPAZINE) 10 MG tablet Take 10 mg by mouth every 6 (six) hours as needed.       . pyridoxine (B-6) 50 MG tablet Take 50 mg by mouth daily.       . valacyclovir  (VALTREX) 1000 MG tablet Take 1,000 mg by mouth 2 (two) times daily.       . [DISCONTINUED] predniSONE (DELTASONE) 20 MG tablet Take 20 mg by mouth daily.           Past History     Past Medical History:  Past Medical History   Diagnosis Date   . Asthma without status asthmaticus    . Leukemia        Past Surgical History:  Past Surgical History   Procedure Date   . Hand surgery        Family History:  History reviewed. No pertinent family history.    Social History:  History   Substance Use Topics   . Smoking status: Never Smoker    . Smokeless tobacco: Not on file   . Alcohol Use: Yes      Comment: very rarely       Allergies:  Allergies   Allergen Reactions   . Codeine Nausea Only   . Morphine Nausea And Vomiting       Review of Systems     Review of Systems   Constitutional: Positive for fever and fatigue.  HENT: Negative for congestion, sore throat and rhinorrhea.    Eyes: Negative for visual disturbance.   Respiratory: Negative for cough and shortness of breath.    Cardiovascular: Negative for chest pain.   Gastrointestinal: Positive for nausea. Negative for vomiting, abdominal pain, diarrhea and blood in stool.   Genitourinary: Negative for dysuria and frequency.   Musculoskeletal: Negative for back pain.   Skin: Negative for rash.   Neurological: Positive for dizziness (earlier today) and weakness. Negative for numbness and headaches.   Hematological: Does not bruise/bleed easily.          Physical Exam   BP 106/51  Pulse 90  Temp 101.4 F (38.6 C) (Oral)  Resp 18  Wt 49.896 kg  SpO2 99%    Physical Exam   Constitutional: She is oriented to person, place, and time. No distress.   HENT:   Head: Normocephalic and atraumatic.   Mouth/Throat: Oropharynx is clear and moist.   Eyes: Conjunctivae normal and EOM are normal. Pupils are equal, round, and reactive to light. Left eye exhibits no discharge. No scleral icterus.   Neck: Normal range of motion. Neck supple. No JVD present.   Cardiovascular: Normal  rate, regular rhythm, normal heart sounds and intact distal pulses.  Exam reveals no gallop and no friction rub.    No murmur heard.  Pulmonary/Chest: Effort normal and breath sounds normal. No respiratory distress. She has no wheezes. She has no rales. She exhibits no tenderness.   Abdominal: Soft. Bowel sounds are normal. She exhibits no mass. There is no tenderness. There is no rebound and no guarding.   Musculoskeletal: Normal range of motion. She exhibits no edema and no tenderness.   Lymphadenopathy:     She has no cervical adenopathy.   Neurological: She is alert and oriented to person, place, and time. No cranial nerve deficit. Gait normal. GCS score is 15.   Skin: Skin is warm and dry. No rash noted. She is not diaphoretic.   Psychiatric: Mood, memory, affect and judgment normal.         Diagnostic Study Results     Labs -     Results     Procedure Component Value Units Date/Time    Urinalysis with microscopic [[id number]] Collected:10/04/12 1916    Specimen Information:Urine Updated:10/04/12 1937     Urine Type Clean Catch      Color, UA Yellow      Clarity, UA Clear      Specific Gravity UA 1.017      Urine pH 6.0      Leukocyte Esterase, UA Negative      Nitrite, UA Negative      Protein, UA Negative      Glucose, UA Negative      Ketones UA Negative      Urobilinogen, UA Negative mg/dL      Bilirubin, UA Negative      Blood, UA Negative      RBC, UA 0 - 5      WBC, UA 0 - 5      Squamous Epithelial Cells, Urine 0 - 5     CBC and differential [[id number]]  (Abnormal) Collected:10/04/12 1840    Specimen Information:Blood / Blood Updated:10/04/12 1929     WBC 0.52 (LL)      RBC 2.95 (L)      Hgb 9.3 (L) g/dL      Hematocrit 16.0 (L) %      MCV 94.2 fL  MCH 31.5 pg      MCHC 33.5 g/dL      RDW 13 %      Platelets 11 (LL)      MPV 11.0 fL     Urine culture [[id number]] Collected:10/04/12 1916    Specimen Information:Urine / Urine, Clean Catch Updated:10/04/12 1916    Basic Metabolic Panel [[id number]]  Collected:10/04/12 1840    Specimen Information:Blood Updated:10/04/12 1902     Glucose 99 mg/dL      BUN 03.4 mg/dL      Creatinine 0.7 mg/dL      CALCIUM 9.9 mg/dL      Sodium 742      Potassium 4.0      Chloride 104      CO2 23      Anion Gap 11.0     Hemolysis index [[id number]] Collected:10/04/12 1840     Hemolysis Index 1 Updated:10/04/12 1902    GFR [[id number]] Collected:10/04/12 1840     EGFR >60.0 Updated:10/04/12 1902    Lactic acid, plasma [[id number]] Collected:10/04/12 1846     Lactic acid 0.6 Updated:10/04/12 1847    Blood Culture #2 [[id number]] Collected:10/04/12 1840    Specimen Information:Blood / Blood Updated:10/04/12 1840    Narrative:    8ml required    Blood Culture #1 [[id number]] Collected:10/04/12 1840    Specimen Information:Blood / Blood Updated:10/04/12 1840    Narrative:    8ml required          Radiologic Studies -   Radiology Results (24 Hour)     Procedure Component Value Units Date/Time    XR Chest 2 Views [[id number]] Collected:10/04/12 1921    Order Status:Completed  Updated:10/04/12 1926    Narrative:    INDICATION: fever    TECHNIQUE:  PA and lateral radiographs of the chest were obtained.      FINDINGS:  The heart size is within normal limits.  The lungs are clear  of acute disease.  There is no pneumothorax or pleural effusion. There  is a central venous catheter in the SVC.        Impression:      No evidence of acute process.    Aquilla Hacker, MD   10/04/2012 7:21 PM      .      Medical Decision Making   I am the first provider for this patient.    I reviewed the vital signs, available nursing notes, past medical history, past surgical history, family history and social history.    Vital Signs-Reviewed the patient's vital signs.     Patient Vitals for the past 12 hrs:   BP Temp Pulse Resp   10/04/12 2001 106/51 mmHg 101.4 F (38.6 C) 90  18    10/04/12 1753 108/48 mmHg 101.1 F (38.4 C) 92  18        Pulse Oximetry Analysis - Normal 100% on Room Air.      Old Medical Records: Old  medical records.  Nursing notes.     ED Course:     6:37 PM Discussed patient with Dr. Blake Divine (Oncology) who will consult on case. Patient informed of discussion and subsequent admission plans. Will call [facility] covering for Dr. Milta Deiters (PCP).     8:00 PM - Discussed patient with Dr. Lattie Haw (covering for [facility]) who agrees to admit patient.  Will admit with Neutropenic bed.     Provider Notes:       Diagnosis  Clinical Impression:   1. Neutropenic fever        _______________________________    Attestations:  This note is prepared by Nicholes Rough acting as Neurosurgeon for [employer]. Laurine Blazer, MD.     Santiago Bumpers. Laurine Blazer, MD - The scribe's documentation has been prepared under my direction and personally reviewed by me in its entirety.  I confirm that the note above accurately reflects all work, treatment, procedures, and medical decision making performed by me.    _______________________________          Lester Kinsman, MD  10/04/12 2218

## 2012-10-05 LAB — COMPREHENSIVE METABOLIC PANEL
ALT: 20 U/L (ref 0–55)
AST (SGOT): 14 U/L (ref 5–34)
Albumin/Globulin Ratio: 1.3 (ref 0.9–2.2)
Albumin: 3 g/dL — ABNORMAL LOW (ref 3.5–5.0)
Alkaline Phosphatase: 88 U/L (ref 40–150)
Anion Gap: 6 (ref 5.0–15.0)
BUN: 11 mg/dL (ref 7.0–19.0)
Bilirubin, Total: 0.4 mg/dL (ref 0.2–1.2)
CO2: 26 (ref 22–29)
Calcium: 9.6 mg/dL (ref 8.5–10.5)
Chloride: 105 (ref 98–107)
Creatinine: 0.6 mg/dL (ref 0.6–1.0)
Globulin: 2.4 g/dL (ref 2.0–3.6)
Glucose: 114 mg/dL — ABNORMAL HIGH (ref 70–100)
Potassium: 3.9 (ref 3.5–5.1)
Protein, Total: 5.4 g/dL — ABNORMAL LOW (ref 6.0–8.3)
Sodium: 137 (ref 136–145)

## 2012-10-05 LAB — CBC
Hematocrit: 25.3 % — ABNORMAL LOW (ref 37.0–47.0)
Hgb: 8.4 g/dL — ABNORMAL LOW (ref 12.0–16.0)
MCH: 31.3 pg (ref 28.0–32.0)
MCHC: 33.2 g/dL (ref 32.0–36.0)
MCV: 94.4 fL (ref 80.0–100.0)
MPV: 10 fL (ref 9.4–12.3)
Nucleated RBC: 0 (ref 0–1)
Platelets: 6 — CL (ref 140–400)
RBC: 2.68 — ABNORMAL LOW (ref 4.20–5.40)
RDW: 13 % (ref 12–15)
WBC: 0.16 — ABNORMAL LOW (ref 3.50–10.80)

## 2012-10-05 LAB — GFR: EGFR: >60

## 2012-10-05 LAB — HEMOLYSIS INDEX: Hemolysis Index: 3 (ref 0–18)

## 2012-10-05 LAB — PREPARE PLATELETS: Platelet Product-: TRANSFUSED

## 2012-10-05 MED ORDER — SODIUM CHLORIDE 0.9 % IV MBP
1.0000 g | Freq: Three times a day (TID) | INTRAVENOUS | Status: DC
Start: 2012-10-05 — End: 2012-10-05

## 2012-10-05 MED ORDER — SODIUM CHLORIDE 0.9 % IV MBP
500.0000 mg | Freq: Four times a day (QID) | INTRAVENOUS | Status: DC
Start: 2012-10-05 — End: 2012-10-08
  Administered 2012-10-05 – 2012-10-08 (×12): 500 mg via INTRAVENOUS
  Filled 2012-10-05 (×14): qty 0.5

## 2012-10-05 MED ORDER — ALLOPURINOL 100 MG PO TABS
100.0000 mg | ORAL_TABLET | Freq: Every day | ORAL | Status: DC
Start: 2012-10-05 — End: 2012-10-09
  Administered 2012-10-05 – 2012-10-09 (×5): 100 mg via ORAL
  Filled 2012-10-05 (×5): qty 1

## 2012-10-05 MED ORDER — TBO-FILGRASTIM 300 MCG/0.5ML SC SOSY
300.0000 ug | PREFILLED_SYRINGE | Freq: Every day | SUBCUTANEOUS | Status: DC
Start: 2012-10-05 — End: 2012-10-08
  Administered 2012-10-05 – 2012-10-07 (×3): 300 ug via SUBCUTANEOUS
  Filled 2012-10-05 (×4): qty 0.5

## 2012-10-05 MED ORDER — VITAMIN B-6 50 MG PO TABS
50.0000 mg | ORAL_TABLET | Freq: Every day | ORAL | Status: DC
Start: 2012-10-05 — End: 2012-10-09
  Administered 2012-10-05 – 2012-10-09 (×5): 50 mg via ORAL
  Filled 2012-10-05 (×5): qty 1

## 2012-10-05 MED ORDER — ACYCLOVIR 800 MG PO TABS
400.0000 mg | ORAL_TABLET | Freq: Two times a day (BID) | ORAL | Status: DC
Start: 2012-10-05 — End: 2012-10-05

## 2012-10-05 MED ORDER — VANCOMYCIN HCL 1000 MG IV SOLR
1000.0000 mg | Freq: Three times a day (TID) | INTRAVENOUS | Status: DC
Start: 2012-10-05 — End: 2012-10-06
  Administered 2012-10-05 – 2012-10-06 (×4): 1000 mg via INTRAVENOUS
  Filled 2012-10-05 (×4): qty 1000

## 2012-10-05 MED ORDER — FLUCONAZOLE 100 MG PO TABS
200.0000 mg | ORAL_TABLET | Freq: Every day | ORAL | Status: DC
Start: 2012-10-05 — End: 2012-10-05

## 2012-10-05 MED ORDER — ACYCLOVIR 800 MG PO TABS
400.0000 mg | ORAL_TABLET | Freq: Two times a day (BID) | ORAL | Status: DC
Start: 2012-10-05 — End: 2012-10-08
  Administered 2012-10-05 – 2012-10-08 (×7): 400 mg via ORAL
  Filled 2012-10-05 (×8): qty 1

## 2012-10-05 MED ORDER — VANCOMYCIN HCL 1000 MG IV SOLR
1000.0000 mg | Freq: Two times a day (BID) | INTRAVENOUS | Status: DC
Start: 2012-10-05 — End: 2012-10-05
  Filled 2012-10-05: qty 1000

## 2012-10-05 MED ORDER — DIPHENHYDRAMINE HCL 25 MG PO CAPS
25.0000 mg | ORAL_CAPSULE | Freq: Once | ORAL | Status: AC
Start: 2012-10-05 — End: 2012-10-05
  Administered 2012-10-05: 25 mg via ORAL
  Filled 2012-10-05: qty 1

## 2012-10-05 MED ORDER — ACETAMINOPHEN 325 MG PO TABS
650.0000 mg | ORAL_TABLET | Freq: Once | ORAL | Status: AC
Start: 2012-10-05 — End: 2012-10-05
  Administered 2012-10-05: 650 mg via ORAL
  Filled 2012-10-05: qty 2

## 2012-10-05 MED ORDER — DEXTROSE-SODIUM CHLORIDE 5-0.9 % IV SOLN
INTRAVENOUS | Status: DC
Start: 2012-10-05 — End: 2012-10-06

## 2012-10-05 MED ORDER — ONDANSETRON HCL 4 MG/2ML IJ SOLN
4.0000 mg | INTRAMUSCULAR | Status: DC | PRN
Start: 2012-10-05 — End: 2012-10-09

## 2012-10-05 MED ORDER — ZOLPIDEM TARTRATE 5 MG PO TABS
5.0000 mg | ORAL_TABLET | Freq: Every evening | ORAL | Status: DC | PRN
Start: 2012-10-05 — End: 2012-10-07

## 2012-10-05 MED ORDER — FLUCONAZOLE 100 MG PO TABS
100.0000 mg | ORAL_TABLET | Freq: Every day | ORAL | Status: DC
Start: 2012-10-06 — End: 2012-10-08
  Administered 2012-10-06 – 2012-10-08 (×3): 100 mg via ORAL
  Filled 2012-10-05 (×3): qty 1

## 2012-10-05 MED ORDER — VALACYCLOVIR HCL 500 MG PO TABS
1000.0000 mg | ORAL_TABLET | Freq: Two times a day (BID) | ORAL | Status: DC
Start: 2012-10-05 — End: 2012-10-05

## 2012-10-05 MED ORDER — DEXAMETHASONE 0.1 % OP SUSP
1.0000 [drp] | Freq: Two times a day (BID) | OPHTHALMIC | Status: DC
Start: 2012-10-05 — End: 2012-10-09
  Administered 2012-10-06 – 2012-10-09 (×7): 1 [drp] via OPHTHALMIC
  Filled 2012-10-05: qty 5

## 2012-10-05 MED ORDER — RISAQUAD PO CAPS
1.0000 | ORAL_CAPSULE | Freq: Every day | ORAL | Status: DC
Start: 2012-10-05 — End: 2012-10-09
  Administered 2012-10-05 – 2012-10-09 (×5): 1 via ORAL
  Filled 2012-10-05 (×5): qty 1

## 2012-10-05 MED ORDER — SODIUM CHLORIDE 0.9 % IV MBP
2.0000 g | Freq: Three times a day (TID) | INTRAVENOUS | Status: DC
Start: 2012-10-05 — End: 2012-10-05
  Filled 2012-10-05 (×2): qty 2

## 2012-10-05 MED ORDER — FLUCONAZOLE 100 MG PO TABS
100.0000 mg | ORAL_TABLET | Freq: Every day | ORAL | Status: DC
Start: 2012-10-05 — End: 2012-10-05

## 2012-10-05 NOTE — Treatment Plan (Signed)
VTE/PE Risk Screening  Complete Upon Admission and Transfer to Different Level of Care  Completed by nurse: Tommi Rumps 10/05/2012 1:14 AM   -----------------------------------------------------------------------------------------------------------  SECTION 1 - Risk Screening     []   Patient currently receiving anticoagulation therapy (Heparin, Lovenox, Coumadin, Pradaxa, Xarelto, or Arixtra Only) and received 1 dose within 24 hours of admission STOP HERE   []   VTE/PE prophylaxis currently prescribed elsewhere - STOP HERE   []   Comfort Care - STOP HERE   []   Clinical Trials - STOP HERE    Contraindications: Patients with a history of the following conditions cannot haveSequential compression devices (SCD     []  Any of these conditions present , Call MD for pharmacological prophylaxis or ask MD to document reason for not having both mechanical and pharmacologic VTE prophylaxis   []  Post-op vein ligation   []  Suspected VTE   []  Cellulitis/Dermatitis of the leg   []  Severe ischemic Vascular disease   []  Edema related to Congestive Heart Faliure   []  Gangrene   []  Recent skin graft  -----------------------------------------------------------------------------------------------------------  SECTION 2 - Risk Factors (Check all that apply)    Moderate Risk Factors   []   Heart Failure (current or history of)   []   Respiratory Failure   []   Acute Myocardial Infarction (AMI)   [x]   Acute Infection   []   Rheumatologic Disorder   []   Elderly age (57 years old)   []   Ongoing hormonal treatment / estrogen use (including Tamoxifen, Raloxifene)   []   Obesity (BMI >/= 30kg/m2)    High Risk Factors   []   Recent (</= 1 month) trauma/surgery    Highest Risk Factors   []   Active Cancer   []   Previous VTE   []   Reduced mobility (>24 hrs; current or anticipated)   []   Known thrombophilic condition (hematological disorders that promote thrombosis)      []   No boxes checked in this section indicate patient is at low risk for VTE. No VTE  Prophylaxis indicated.      * No surgery found * [x]   One or more risk factors present, enter an EPIC order for Sequential compression devices (SCD). Use per protocol, MD signature required.

## 2012-10-05 NOTE — Consults (Signed)
Service Date: 10/05/2012     Patient Type: I     CONSULTING PHYSICIAN: Nevada Crane MD     REFERRING PHYSICIAN: Gwyndolyn Kaufman MD     HISTORY OF PRESENT ILLNESS:  [name] is a 57 year old female known to me from her previous admission  with a history of acute myeloid leukemia and is on chemotherapy through a  Mediport in the right upper chest.  Her last chemotherapy was on September  8 for 3 days, and then her counts drop, and she got Neupogen and platelet  transfusion about 4 days ago.  The patient is now admitted with fevers and  chills.  Her maximum temperature recorded in the emergency room is 104.1.   Blood pressure today is 97/52.  She denies having any other associated  symptoms.  No difficulty swallowing or breathing, no nausea, vomiting, or  diarrhea.  No abdominal pain.  No chest pain, cough, or shortness of  breath.  Denies having any skin rashes.  Blood and urine cultures are  ordered and are pending.  The patient is started on vancomycin and  ceftazidime.     PAST MEDICAL HISTORY:  Acute myeloid leukemia and bronchial asthma.     PAST SURGICAL HISTORY:  Heart surgery a long time ago.     FAMILY HISTORY:  Noncontributory.     SOCIAL HISTORY:  No smoking.  Drinks alcohol only socially.     ALLERGIES:  CODEINE and MORPHINE.     REVIEW OF SYSTEMS:  No seizures or syncopal episodes.  No ear, nose, and throat problems.   There is no chest pain, cough, or shortness of breath.  She complains of  some postnasal drip occasionally.  No abdominal pain, nausea, vomiting, and  diarrhea.  No urinary symptoms.  No skin rash or joint pains.     PHYSICAL EXAMINATION:  GENERAL:  The patient is awake and alert, appears to be in no distress.  VITAL SIGNS:  Temperature 99.1, blood pressure was 97/52, pulse is 84,  respiratory rate 16.  HEENT:  Head is normocephalic and atraumatic.  Pupils are reactive to  light.  Sclerae is anicteric.  Oral mucosa is normal.  NECK:  Supple.  There is no neck lymphadenopathy and no  thyromegaly.  LUNGS:  Clear to auscultation.  HEART:  Sounds are regular rate and rhythm without any murmur.  ABDOMEN:  Soft, nontender.  There is no organomegaly.  EXTREMITIES:  No edema, no clubbing, and no cyanosis.  NEUROLOGIC:  Grossly intact.     LABORATORY AND DIAGNOSTIC DATA:   Her white cell count is 0.5; hemoglobin 9.3; hematocrit 27.8; platelet  count is 11,000; segmented neutrophils 0%; bands are 2%.  Sodium 138,  potassium 4.0, chloride 104, bicarbonate 23, BUN 16, creatinine 0.7.   Alkaline phosphatase is 83, ALT 29, AST 17.  Lactic acid is 0.6.  Her UA is  unremarkable.  Blood and urine cultures are ordered and are pending.     She had a chest x-ray done, which is unremarkable.     ASSESSMENT:  Acute myeloid leukemia status post chemotherapy.  The patient recently  received Neupogen and platelet transfusion and is now admitted with febrile  neutropenia.     PLAN:  Plan at this time would be to continue patient on vancomycin, pharmacy to  monitor vancomycin levels.  We will start meropenem until the culture  results are available.  Continue Diflucan.  The patient is on Valtrex but  will change it to  suppressive doses of acyclovir and start patient on  probiotics, monitor patient clinically.     Thank you for the consultation.           D:  10/05/2012 10:14 AM by Dr. Nevada Crane, MD [phone])  T:  10/05/2012 13:59 PM by [place]      (Conf: [id number]) (Doc ID: [id number])

## 2012-10-05 NOTE — H&P (Signed)
Patient Type: I     ATTENDING PHYSICIAN: Lattie Haw, MD     CHIEF COMPLAINT:  Fever.     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  She apparently had completed a cycle of chemotherapy and  was receiving Neupogen as an outpatient as well as a platelet transfusion  she received about 3 days prior to admission.  She was advised that if she  developed a fever of 101 to present to the emergency room for evaluation.   She denied any associated headaches, chest pains, shortness of breath, or  cough.  She has had a postnasal drip, which is chronic and not new.  She  has no purulent nasal discharge.  No dysuria.  She has had nausea but no  vomiting.  No diarrhea as well.  The patient also denies any sick contacts.     PAST MEDICAL HISTORY:  1.  Acute myeloid leukemia.  2.  Bronchial asthma by history.     SURGICAL HISTORY:  Heart surgery in the past.     FAMILY HISTORY:  Noncontributory.  No history of leukemia in the family.     SOCIAL HISTORY:  The patient denies tobacco use.  She drinks alcohol socially.  She is  currently employed as a Airline pilot person.     ALLERGIES:  She intolerance to CODEINE, which causes nausea, and then MORPHINE, which  causes nausea and vomiting.     REVIEW OF SYSTEMS:  The patient admitted to having fever, fatigue, malaise.  She denies any  sore throat.  She has had rhinorrhea as stated above and the postnasal drip   but no purulent nasal discharge.  She has had some dizziness and fatigue.   No dysuria.  No hematemesis or hematochezia.  She did endorse having a  raised lesion on her lower abdominal wall, which she attributes to a prior  injection of Neupogen.     MEDICATIONS PRIOR TO ADMISSION:  1.  Allopurinol 100 mg daily.  2.  Dexamethasone eyedrops 1 drop into the eyes b.i.d.  3.  Diflucan 100 mg daily.  4.  Levaquin 500 mg p.o. daily.  5.   Compazine 10 mg every 6 hours as needed.  6.  B6 50 mg p.o. daily.  7.  Valtrex 1 gram p.o. b.i.d.     PHYSICAL EXAMINATION:  GENERAL:  A pleasant small-framed lady who appeared in no acute distress.  VITAL SIGNS:  Her temperature at the time of my evaluation was 99.1, heart  rate of 84, respirations 16, and a blood pressure of 97/52.  Her last  temperature maximum was 104.1.  HEAD AND  NECK:  Unremarkable.  No throat or ear inflammation.  Neck was  supple, no adenopathy.  LUNGS:  Auscultation of the lungs revealed clear lung fields.  HEART:  Heart sounds 1 and 2 were heard without gallops or rubs.  ABDOMEN:  Benign.  There were no masses palpable.  There was an area of  erythema and raised induration in the left lower quadrant.  No drainage.  EXTREMITIES:  Revealed no edema.  NEUROLOGIC:  Nonfocal.     LABORATORY AND DIAGNOSTIC DATA:   A chest x-ray was done.  Two views revealed no evidence of an infiltrate.   White count on admission was 0.52; hemoglobin of 9.3; hematocrit 27.8;  platelet count of  11,000.  Glucose of 99, BUN of 16, creatinine 0.7, sodium  138, potassium 4.0, chloride 104, CO2 of 23.  Anion gap of 11.  Lactic acid  was 0.8.  Urinalysis was unremarkable.     ASSESSMENT AND PLAN:  1.  Febrile neutropenia.  The patient has been placed on neutropenic  precautions.  We will initiate antibiotic treatment with vancomycin and  ceftazidime to cover both gram-positive and gram-negative organisms.   Infectious disease consultation to make further recommendations about   management.  Blood cultures have been sent.  We will check cultures and  adjust antibiotics.  Urine cultures have also been sent.  2.  Neutropenia and neutropenic precautions.  Continue treatment with  Neupogen.  Hematology-oncology consultation to make further recommendations  about management as well.  3.  Acute myeloid leukemia by history.  Defer further management to  oncology.  Chemotherapy per hematology-oncology when blood counts  recover.  4.  Resume home medications with Valtrex as well as Diflucan.  5.  Monitor CBC and transfuse platelets as needed.  (The patient currently  has no active bleeding.)  6.  Plan as outlined has been discussed with the patient.  Further  recommendations will be based on the clinical course.           D:  10/05/2012 08:37 AM by Dr. Myra Gianotti. Lestine Mount, MD [phone])  T:  10/05/2012 09:45 AM by [place]      (Conf: [id number]) (Doc ID: [id number])

## 2012-10-05 NOTE — Plan of Care (Signed)
Problem: Protective Precautions [in description add neutropenic precautions]  Goal: Free from nosocomial infections  Outcome: Progressing  Pt came from ED. Dx fever and neutropenic precaution, pt is in isolation. Pt's temp. Was high,pt received tylenol in ED. Temp. rechecked it was 99.8. Pt denies sob, chills and nausea. Receiving iv antibiotics and iv fluid.

## 2012-10-05 NOTE — Consults (Signed)
Vancomycin Dosing     Patient weight: 49.896 kg (110 lb)   CREATININE: 0.7 (10/04/12 1840)  Estimated creatinine clearance - Cockcroft-Gault CrCl: 68.2 mL/min      Lab 10/04/12 1840   WBC 0.52*   CREAT 0.7       Tmax: 99.1 F (37.3 C) (Oral)   Tmax overnight: 104.93F      Cultures:  9/21- Blood x 2: Sent  9/21- Urine: Sent    Vancomycin Indication: Febrile Neutropenia  Vancomycin Target Trough Level: 15-20 mg/L     Vancomycin Trough   Date Value Range Status   07/23/2012 13.4  10.0 - 20.0 ug/mL Final     Recommendations:     Vancomycin Dosing:     1. Maintenance regimen = 1g IV Q8H  2. Will empirically dose at ~20 mg/kg Q8H due to patient's age, SCr 0.7, clinical status, and historically requiring 1500mg  Q8H to achieve a therapeutic trough (July 2014).   3. Vancomycin level: Trough ordered prior to 10am dose on Tuesday, 9/23.       Thanks!   Lucia Bitter, PharmD  [phone]

## 2012-10-05 NOTE — Plan of Care (Signed)
Problem: Protective Precautions [in description add neutropenic precautions]  Goal: Free from nosocomial infections  Intervention: Place patient in private room  Pt on neutropenic isolation. Isolations precautions maintained, proper hand hygiene enforced. Received antibiotics and neupogen as ordered. Temp elevated, MD notified, Tylenol given, blood cultures x 2 completed. Will monitor labs, monitor VS.       Problem: Bleeding Precautions  Goal: Free from bleeding  Outcome: Progressing  Pt platelets this am is 6. MD notified. Order given for pt to receive 1 unit platelets. Transfusion procedure explained, pt verbalized understanding, consent obtained from pt chart. Baseline vitals stable. Transfusion initiated with 2nd RN verification at bedside. Transfusion completed with no signs or symptoms of transfusion reaction. VS remained stable.

## 2012-10-05 NOTE — Treatment Plan (Signed)
Infectious Diseases & Tropical  Medicine  Full Consult Dictated        Full consult dictated: # [id number]    Assessment:      Acute myeloid leukemia s/p chemotherapy and neupogen   Febrile neutropenia   H/O Asthma    Plan:     Continue vancomycin    Pharmacy to monitor vancomycin levels   Start meropenem   Continue diflucan   Probiotics    Monitor clinically      Thanks for consultation    Elowen Debruyn A. Janalyn Rouse, MD   10/05/2012

## 2012-10-05 NOTE — Progress Notes (Signed)
HEMATOLOGY ONCOLOGY PROGRESS NOTE    Date Time: 10/05/2012 4:58 PM  Patient Name: [name],[name]      IMPRESSION:     Patient Active Problem List   Diagnosis   . Chondromalacia of patella   . Neutropenic fever   . Febrile neutropenia         PLAN: Orders will be written for prophylactic acyclovir and diflucan.  She has been started on vancomycin and ceftazidime.  Granix will be continued as well.  Lastly in view of fever, pancytopenia and expected progressive severe thrombocytopenia, an infusion of plts was ordered.  BC are pending.             HISTORY:  57 yo with AML s/p induction chemotherapy with the appearance of a CR who is currently undergoing consolidation with high-dose Ara-C and was admitted with a neutropenic fever.  Overall she feels well except for mild nausea and fatigue.  She denies any SOB, abdominal pain or chest pain.       PHYSICAL EXAM:     Filed Vitals:    10/05/12 1523   BP: 120/61   Pulse: 114   Temp: 104.5 F (40.3 C)   Resp: 16   SpO2: 100%       Intake and Output Summary (Last 24 hours) at Date Time    Intake/Output Summary (Last 24 hours) at 10/05/12 1658  Last data filed at 10/05/12 0600   Gross per 24 hour   Intake   1700 ml   Output      0 ml   Net   1700 ml       Appearance: in no acute distress  HEENT: pharynx clear  Chest wall:  mediport in place.  No tenderness.  Lungs: clear to ausculation  Heart: regular rate and rhythm, normal heart sounds  Abdomen: flat and soft, active bowel sounds  Extremities: no edema  Neuro: alert, appropriate  MEDS:   Scheduled Meds:  Current Facility-Administered Medications   Medication Dose Route Frequency   . [COMPLETED] acetaminophen  650 mg Oral Once   . acyclovir  400 mg Oral BID   . allopurinol  100 mg Oral Daily   . dexamethasone  1 drop Both Eyes BID   . [COMPLETED] diphenhydrAMINE  25 mg Oral Once   . [START ON 10/06/2012] fluconazole  100 mg Oral Daily   . lactobacillus/streoptococcus  1 capsule Oral Daily   . meropenem  500 mg Intravenous  Q6H   . [COMPLETED] piperacillin-tazobactam  4.5 g Intravenous Once   . tbo-filgrastim  300 mcg Subcutaneous Daily at 1800   . [COMPLETED] vancomycin  1,000 mg Intravenous Once   . vancomycin  1,000 mg Intravenous Q8H   . vitamin B-6  50 mg Oral Daily   . [DISCONTINUED] acyclovir  400 mg Oral BID   . [DISCONTINUED] ceftAZIDime  1 g Intravenous Q8H SCH   . [DISCONTINUED] ceftAZIDime  2 g Intravenous Q8H   . [DISCONTINUED] fluconazole  100 mg Oral Daily   . [DISCONTINUED] fluconazole  200 mg Oral Daily   . [DISCONTINUED] valacyclovir  1,000 mg Oral BID   . [DISCONTINUED] vancomycin  1,000 mg Intravenous Q12H     Continuous Infusions:     . dextrose 5 % and 0.9% NaCl 75 mL/hr at 10/05/12 1055   . [DISCONTINUED] dextrose 5 % and 0.45% NaCl 100 mL/hr (10/04/12 2322)     PRN Meds:.acetaminophen, acetaminophen, HYDROmorphone, ondansetron, zolpidem, [DISCONTINUED] ondansetron    LABS:  IMAGING DATA:  Xr Chest 2 Views    10/04/2012  INDICATION: fever  TECHNIQUE:  PA and lateral radiographs of the chest were obtained.    FINDINGS:  The heart size is within normal limits.  The lungs are clear of acute disease.  There is no pneumothorax or pleural effusion. There is a central venous catheter in the SVC.       10/04/2012    No evidence of acute process.  Aquilla Hacker, MD  10/04/2012 7:21 PM                       Larene Pickett, MD  [facility]    [phone]

## 2012-10-06 LAB — MAN DIFF ONLY
Atypical Lymphocytes %: 1 %
Atypical Lymphocytes Absolute: 0.01 — ABNORMAL HIGH
Band Neutrophils Absolute: 0.04 (ref 0.00–1.00)
Band Neutrophils: 8 %
Basophils Absolute Manual: 0 (ref 0.00–0.20)
Basophils Manual: 0 %
Eosinophils Absolute Manual: 0 (ref 0.00–0.70)
Eosinophils Manual: 0 %
Lymphocytes Absolute Manual: 0.31 — ABNORMAL LOW (ref 0.50–4.40)
Lymphocytes Manual: 59 %
Monocytes Absolute: 0.1 (ref 0.00–1.20)
Monocytes Manual: 20 %
Neutrophils Absolute Manual: 0.06 — ABNORMAL LOW (ref 1.80–8.10)
Nucleated RBC: 0 (ref 0–1)
Segmented Neutrophils: 12 %

## 2012-10-06 LAB — VANCOMYCIN, TROUGH
Vancomycin Trough: 31.8 ug/mL (ref 10.0–20.0)
Vancomycin Trough: 8.6 ug/mL — ABNORMAL LOW (ref 10.0–20.0)

## 2012-10-06 LAB — COMPREHENSIVE METABOLIC PANEL
ALT: 20 U/L (ref 0–55)
AST (SGOT): 14 U/L (ref 5–34)
Albumin/Globulin Ratio: 1.1 (ref 0.9–2.2)
Albumin: 2.5 g/dL — ABNORMAL LOW (ref 3.5–5.0)
Alkaline Phosphatase: 71 U/L (ref 40–150)
Anion Gap: 6 (ref 5.0–15.0)
BUN: 8 mg/dL (ref 7.0–19.0)
Bilirubin, Total: 0.2 mg/dL (ref 0.2–1.2)
CO2: 23 (ref 22–29)
Calcium: 8.8 mg/dL (ref 8.5–10.5)
Chloride: 112 — ABNORMAL HIGH (ref 98–107)
Creatinine: 0.7 mg/dL (ref 0.6–1.0)
Globulin: 2.3 g/dL (ref 2.0–3.6)
Glucose: 185 mg/dL — ABNORMAL HIGH (ref 70–100)
Potassium: 3 — ABNORMAL LOW (ref 3.5–5.1)
Protein, Total: 4.8 g/dL — ABNORMAL LOW (ref 6.0–8.3)
Sodium: 141 (ref 136–145)

## 2012-10-06 LAB — CBC AND DIFFERENTIAL
Hematocrit: 22.7 % — ABNORMAL LOW (ref 37.0–47.0)
Hgb: 7.6 g/dL — ABNORMAL LOW (ref 12.0–16.0)
MCH: 31.7 pg (ref 28.0–32.0)
MCHC: 33.5 g/dL (ref 32.0–36.0)
MCV: 94.6 fL (ref 80.0–100.0)
MPV: 9.8 fL (ref 9.4–12.3)
Platelets: 29 — ABNORMAL LOW (ref 140–400)
RBC: 2.4 — ABNORMAL LOW (ref 4.20–5.40)
RDW: 13 % (ref 12–15)
WBC: 0.52 — ABNORMAL LOW (ref 3.50–10.80)

## 2012-10-06 LAB — CELL MORPHOLOGY: Cell Morphology: NORMAL

## 2012-10-06 LAB — GFR: EGFR: >60

## 2012-10-06 LAB — HEMOLYSIS INDEX
Hemolysis Index: 3 (ref 0–18)
Hemolysis Index: 7 (ref 0–18)

## 2012-10-06 LAB — MAGNESIUM: Magnesium: 1.8 mg/dL (ref 1.6–2.6)

## 2012-10-06 MED ORDER — POTASSIUM CHLORIDE CRYS ER 20 MEQ PO TBCR
40.0000 meq | EXTENDED_RELEASE_TABLET | ORAL | Status: AC
Start: 2012-10-06 — End: 2012-10-06
  Administered 2012-10-06 (×2): 40 meq via ORAL
  Filled 2012-10-06 (×2): qty 2

## 2012-10-06 MED ORDER — VANCOMYCIN HCL 1000 MG IV SOLR
1250.0000 mg | Freq: Three times a day (TID) | INTRAVENOUS | Status: DC
Start: 2012-10-06 — End: 2012-10-08
  Administered 2012-10-06 – 2012-10-08 (×5): 1250 mg via INTRAVENOUS
  Filled 2012-10-06 (×6): qty 1250

## 2012-10-06 MED ORDER — ACETAMINOPHEN 325 MG PO TABS
650.0000 mg | ORAL_TABLET | Freq: Four times a day (QID) | ORAL | Status: DC | PRN
Start: 2012-10-06 — End: 2012-10-09
  Administered 2012-10-06 – 2012-10-08 (×7): 650 mg via ORAL
  Filled 2012-10-06 (×7): qty 2

## 2012-10-06 NOTE — Progress Notes (Signed)
Infectious Diseases & Tropical Medicine  Progress Note    10/06/2012   [name] CSN:[id number],MRN:[id number] is a 57 y.o. female,       Assessment:      Acute myeloid leukemia s/p chemotherapy and neupogen   Febrile neutropenia   Blood cultures - NGTD (10/04/12)   H/O Asthma   Clinically stable    Plan:      Continue vancomycin     Pharmacy monitoring vancomycin levels   Continue meropenem   Continue diflucan   Probiotics     Monitor clinically    ROS:     General:  Fever, no chills, no rigor, awake, alert, ambulating in the room   HEENT: no neck pain, no throat pain, mild sinus congestion  Endocrine: c/o fatigue   Respiratory: no cough, shortness of breath, or wheezing   Cardiovascular: no chest pain   Gastrointestinal: no abdominal pain,no N/V/D  Genito-Urinary: no dysuria, trouble voiding, or hematuria   Musculoskeletal: no edema  Neurological: c/o generalized weakness   Dermatological: no rash, no ulcer    Physical Examination:     Blood pressure 104/66, pulse 104, temperature 102.4 F (39.1 C), temperature source Oral, resp. rate 20, height 1.651 m (5\' 5" ), weight 49.896 kg (110 lb), SpO2 98.00%.     General Appearance: Comfortable, and in no acute distress.  Awake and alert.   HEENT: Pupils are equal, round, and reactive to light.    Lungs:  CTA   Heart: RRR   Chest: Symmetric chest wall expansion.    Abdomen: soft ,non tender,no hepatosplenomegaly   Neurological: No focal deficit   Extremities: No edema    Laboratory And Diagnostic Studies:     Recent Labs   [facility] 10/06/12 0509 10/05/12 1043    WBC 0.52* 0.16*    HGB 7.6* 8.4*    HCT 22.7* 25.3*    PLT 29* 6*     Recent Labs   [facility] 10/06/12 0509 10/05/12 1043    NA 141 137    K 3.0* 3.9    CL 112* 105    CO2 23 26    BUN 8.0 11.0    CREAT 0.7 0.6    GLU 185* 114*    CA 8.8 9.6     Recent Labs   [facility] 10/06/12 0509 10/05/12 1043    AST 14 14    ALT 20 20    ALKPHOS 71 88    PROT 4.8* 5.4*    ALB 2.5* 3.0*       Current Meds:       Scheduled Meds: PRN Meds:         [COMPLETED] acetaminophen 650 mg Oral Once   acyclovir 400 mg Oral BID   allopurinol 100 mg Oral Daily   dexamethasone 1 drop Both Eyes BID   [COMPLETED] diphenhydrAMINE 25 mg Oral Once   fluconazole 100 mg Oral Daily   lactobacillus/streoptococcus 1 capsule Oral Daily   meropenem 500 mg Intravenous Q6H   tbo-filgrastim 300 mcg Subcutaneous Daily at 1800   vancomycin 1,000 mg Intravenous Q8H   vitamin B-6 50 mg Oral Daily   [DISCONTINUED] ceftAZIDime 2 g Intravenous Q8H   [DISCONTINUED] fluconazole 200 mg Oral Daily   [DISCONTINUED] valacyclovir 1,000 mg Oral BID   [DISCONTINUED] vancomycin 1,000 mg Intravenous Q12H       Continuous Infusions:       . dextrose 5 % and 0.9% NaCl 75 mL/hr at 10/05/12 1055         [  EXPIRED] acetaminophen 650 mg Q6H PRN   [EXPIRED] acetaminophen 650 mg Q6H PRN   acetaminophen 650 mg Q6H PRN   [EXPIRED] HYDROmorphone 1 mg Q2H PRN   ondansetron 4 mg Q4H PRN   zolpidem 5 mg QHS PRN         Pamela Intrieri A. Janalyn Rouse, M.D.  10/06/2012  8:43 AM

## 2012-10-06 NOTE — Progress Notes (Signed)
INTERNAL MEDICINE PROGRESS NOTE    Date: 09/23/20144:24 PM  Patient Name:[name],[name] 56 y.o. female admitted with Febrile neutropenia  Patient status: Inpatient  Hospital Day: 2     Assessment:    Neutropenic fever - Tmax 102.4   AML   Hx of asthma    Pancytopenia    Hypotension - resolved    Hypokalemia     Plan:    [place] IVF    Replete K    Check magnesium also    Continue abx as per ID- d/[name] Dr.Choudhary   Continue other management    [place] plan once clear by ID    Subjective:   Denies pain . Feels better. No complaint    Hospital problems:  Principal Problem:   *Febrile neutropenia  Active Problems:   Hypotension   Thrombocytopenia   Acute myeloid leukemia      Medications:      Scheduled Meds: PRN Meds:           acyclovir 400 mg Oral BID   allopurinol 100 mg Oral Daily   dexamethasone 1 drop Both Eyes BID   fluconazole 100 mg Oral Daily   lactobacillus/streoptococcus 1 capsule Oral Daily   meropenem 500 mg Intravenous Q6H   tbo-filgrastim 300 mcg Subcutaneous Daily at 1800   vancomycin 1,250 mg Intravenous Q8H   vitamin B-6 50 mg Oral Daily   [DISCONTINUED] vancomycin 1,000 mg Intravenous Q8H       Continuous Infusions:       . dextrose 5 % and 0.9% NaCl 75 mL/hr at 10/05/12 1055         [EXPIRED] acetaminophen 650 mg Q6H PRN   [EXPIRED] acetaminophen 650 mg Q6H PRN   acetaminophen 650 mg Q6H PRN   [EXPIRED] HYDROmorphone 1 mg Q2H PRN   ondansetron 4 mg Q4H PRN   zolpidem 5 mg QHS PRN             Review of Systems:   General:  Fever/chills: +ve  HEENT: Negative for Headache,blurred vision,sore throat  Respiratory: Cough/ SOB- Absent  Cardiac: Chest pain/SOB- absent  Gastrointestinal: Nausea/vomiting/diarrhea/constipation-Absent  Genitourinary: No burning micturition.   Musculoskeletal:Negative for - joint pain, muscular weakness or swelling   CNS/Neurological:No headache,weakness,blurred vision     Physical Exam:   BP 115/59  Pulse 91  Temp 99.1 F (37.3 C) (Oral)  Resp 16  Ht 1.651 m (5\' 5" )   Wt 49.896 kg (110 lb)  BMI 18.31 kg/m2  SpO2 99%    Intake and Output Summary (Last 24 hours) at Date Time    Intake/Output Summary (Last 24 hours) at 10/06/12 1624  Last data filed at 10/05/12 1815   Gross per 24 hour   Intake    700 ml   Output      3 ml   Net    697 ml       General appearance - alert, well appearing, and in no distress  HEENT: NC/AT, PERL b/l, nares clear, normal hearing, o/p clear  Neck - supple  Chest - clear to auscultation  Heart - normal S1 and S2 and regular rhythm  Abdomen - bowel sounds +ve, soft, non distended  Extremities - no pedal edema  Neurological - Alert    Labs:       Lab 10/06/12 0509 10/05/12 1043 10/04/12 1840   WBC 0.52* 0.16* 0.52*   HGB 7.6* 8.4* 9.3*   HCT 22.7* 25.3* 27.8*   PLT 29* 6* 11*  MCV 94.6 94.4 94.2   NEUTRO 12 -- 0       Lab 10/06/12 0509 10/05/12 1043 10/04/12 1840   NA 141 137 138   K 3.0* 3.9 4.0   CL 112* 105 104   CO2 23 26 23    BUN 8.0 11.0 16.0   CREAT 0.7 0.6 0.7   GLU 185* 114* 99   CA 8.8 9.6 9.9   MG -- -- --   PHOS -- -- --   PROT 4.8* 5.4* --   ALB 2.5* 3.0* --   AST 14 14 --   ALT 20 20 --   ALKPHOS 71 88 --   BILITOTAL 0.2 0.4 --     Glucose:      Lab 10/06/12 0509 10/05/12 1043 10/04/12 1840   GLU 185* 114* 99       Microbiology Results     Procedure Component Value Units Date/Time    Blood Culture #1 [[id number]] Collected:10/04/12 1840    Specimen Information:Blood / Blood Updated:10/06/12 0022    Narrative:    ORDER#: [id number]                                    ORDERED BY: Silvio Pate  SOURCE: Blood peripheral                             COLLECTED:  10/04/12 18:40  ANTIBIOTICS AT COLL.:                                RECEIVED :  10/04/12 23:46  Culture Blood                              PRELIM      10/06/12 00:21  10/06/12   No Growth after 1 day/s of incubation.      Blood Culture #2 [[id number]] Collected:10/04/12 1840    Specimen Information:Blood / Blood Updated:10/06/12 0022    Narrative:    ORDER#: [id number]                                     ORDERED BY: Silvio Pate  SOURCE: Blood peripheral                             COLLECTED:  10/04/12 18:40  ANTIBIOTICS AT COLL.:                                RECEIVED :  10/04/12 23:46  Culture Blood                              PRELIM      10/06/12 00:21  10/06/12   No Growth after 1 day/s of incubation.      Culture Blood, Aerobic [[id number]] Collected:10/05/12 1626    Specimen Information:Blood / Blood, Intravenous Line Updated:10/05/12 2012    Narrative:    8ml required    Culture Blood, Aerobic [[id number]] Collected:10/05/12 1626    Specimen Information:Blood / Blood, Venipuncture Updated:10/05/12 2012  Narrative:    8ml required    Urine culture [[id number]] Collected:10/04/12 1916    Specimen Information:Urine / Urine, Clean Catch Updated:10/05/12 2157    Narrative:    ORDER#: [id number]                                    ORDERED BY: Silvio Pate  SOURCE: Urine, Clean Catch                           COLLECTED:  10/04/12 19:16  ANTIBIOTICS AT COLL.:                                RECEIVED :  10/04/12 23:13  Culture Urine                              FINAL       10/05/12 21:57  10/05/12   1,000 - 9,000 CFU/ML of normal urogenital or skin microbiota, no further             work            Rads:   Xr Chest 2 Views    10/04/2012    No evidence of acute process.  Aquilla Hacker, MD  10/04/2012 7:21 PM         Christophe Louis, DO  Internal Medicine  10/06/2012  4:24 PM

## 2012-10-06 NOTE — Consults (Signed)
Pharmacy Note: Vancomycin Dosing Consult    Patient weight: 49.896 kg (110 lb)   Patient SCr & CrCl:CREATININE: 0.7 (10/06/12 0509)  Estimated creatinine clearance - Cockcroft-Gault CrCl: 68.2 mL/min      Cultures (date/source): 10/04/12 BCx2-no growth to date    Vancomycin Indication: febrile neutropenia    Increase dose to 1250mg  q8h based on 8.6 trough level. (High trough level (31.8) was incorrectly drawn 2 hours after 3 am dose (9/23)).     Trough ordered prior to  9/25, 4 am   dose (date/time)     Vancomycin Target Trough Level: 15-20 mg/L      Lab 10/06/12 0509 10/05/12 1043 10/04/12 1840   CREAT 0.7 0.6 0.7       Recommendations:     New Regimen: changed from 1gm q8h to 1250mg  q8h    Thanks for the consultation!    Rph name:Aaleah Hirsch C Aquita Simmering  Rph phone: [phone]

## 2012-10-06 NOTE — Plan of Care (Signed)
Problem: Safety  Goal: Patient will be free from injury during hospitalization  Outcome: Progressing  Patient maintained on neutropenic precaution.  Ordered IVF infusing and IV antibiotics as ordered. Out of bed to bedside commode with amber color urine noted.

## 2012-10-06 NOTE — Progress Notes (Signed)
HEMATOLOGY ONCOLOGY PROGRESS NOTE    Date Time: 10/06/2012 4:54 PM  Patient Name: [name]      IMPRESSION:     Patient Active Problem List   Diagnosis   . Chondromalacia of patella   . Neutropenic fever   . Febrile neutropenia   . Hypotension   . Thrombocytopenia   . Acute myeloid leukemia         PLAN:  No change will be made in her current mediations.  It is anticipated that she will remain neutropenic for another 3 to 7 days.  BC are negative.  It is likely that PRBCs will need to be administered in the AM.             HISTORY: 57 yo with AML s/p induction chemotherapy with the appearance of a CR who is currently undergoing consolidation with high-dose Ara-C and was admitted with a neutropenic fever. This is her first consolidation cycle.  Today is day 15 following initiation of high-dose Ara-C.  Overall she feels fatigued and continues to be febrile with nausea.  No significant SOB.          PHYSICAL EXAM:     Filed Vitals:    10/06/12 1511   BP: 115/59   Pulse: 91   Temp: 99.1 F (37.3 C)   Resp: 16   SpO2: 99%       Intake and Output Summary (Last 24 hours) at Date Time    Intake/Output Summary (Last 24 hours) at 10/06/12 1654  Last data filed at 10/05/12 1815   Gross per 24 hour   Intake    700 ml   Output      3 ml   Net    697 ml       Appearance: in no acute distress  HEENT: pharynx clear  Lungs: clear to ausculation  Heart: regular rate and rhythm, normal heart sounds  Abdomen: flat and soft, active bowel sounds  Extremities: no edema  Neuro: alert, appropriate  MEDS:   Scheduled Meds:  Current Facility-Administered Medications   Medication Dose Route Frequency   . acyclovir  400 mg Oral BID   . allopurinol  100 mg Oral Daily   . dexamethasone  1 drop Both Eyes BID   . fluconazole  100 mg Oral Daily   . lactobacillus/streoptococcus  1 capsule Oral Daily   . meropenem  500 mg Intravenous Q6H   . potassium chloride SA  40 mEq Oral Q4H   . tbo-filgrastim  300 mcg Subcutaneous Daily at 1800   .  vancomycin  1,250 mg Intravenous Q8H   . vitamin B-6  50 mg Oral Daily   . [DISCONTINUED] vancomycin  1,000 mg Intravenous Q8H     Continuous Infusions:     . [DISCONTINUED] dextrose 5 % and 0.9% NaCl 75 mL/hr at 10/05/12 1055     PRN Meds:.[EXPIRED] acetaminophen, [EXPIRED] acetaminophen, acetaminophen, [EXPIRED] HYDROmorphone, ondansetron, zolpidem    LABS:         IMAGING DATA:  Xr Chest 2 Views    10/04/2012  INDICATION: fever  TECHNIQUE:  PA and lateral radiographs of the chest were obtained.    FINDINGS:  The heart size is within normal limits.  The lungs are clear of acute disease.  There is no pneumothorax or pleural effusion. There is a central venous catheter in the SVC.       10/04/2012    No evidence of acute process.  Aquilla Hacker, MD  10/04/2012 7:21 PM                       Larene Pickett, MD  [facility]    [phone]

## 2012-10-07 LAB — CBC AND DIFFERENTIAL
Hematocrit: 24.6 % — ABNORMAL LOW (ref 37.0–47.0)
Hgb: 8.1 g/dL — ABNORMAL LOW (ref 12.0–16.0)
MCH: 30.9 pg (ref 28.0–32.0)
MCHC: 32.9 g/dL (ref 32.0–36.0)
MCV: 93.9 fL (ref 80.0–100.0)
MPV: 10.9 fL (ref 9.4–12.3)
Platelets: 23 — ABNORMAL LOW (ref 140–400)
RBC: 2.62 — ABNORMAL LOW (ref 4.20–5.40)
RDW: 13 % (ref 12–15)
WBC: 4.72 (ref 3.50–10.80)

## 2012-10-07 LAB — MAN DIFF ONLY
Band Neutrophils Absolute: 0.24 (ref 0.00–1.00)
Band Neutrophils: 5 %
Basophils Absolute Manual: 0 (ref 0.00–0.20)
Basophils Manual: 0 %
Eosinophils Absolute Manual: 0.05 (ref 0.00–0.70)
Eosinophils Manual: 1 %
Lymphocytes Absolute Manual: 0.9 (ref 0.50–4.40)
Lymphocytes Manual: 19 %
Monocytes Absolute: 1.46 — ABNORMAL HIGH (ref 0.00–1.20)
Monocytes Manual: 31 %
Neutrophils Absolute Manual: 2.08 (ref 1.80–8.10)
Nucleated RBC: 0 (ref 0–1)
Segmented Neutrophils: 44 %

## 2012-10-07 LAB — PHOSPHORUS: Phosphorus: 1.6 mg/dL — ABNORMAL LOW (ref 2.3–4.7)

## 2012-10-07 LAB — COMPREHENSIVE METABOLIC PANEL
ALT: 26 U/L (ref 0–55)
AST (SGOT): 17 U/L (ref 5–34)
Albumin/Globulin Ratio: 1 (ref 0.9–2.2)
Albumin: 2.7 g/dL — ABNORMAL LOW (ref 3.5–5.0)
Alkaline Phosphatase: 72 U/L (ref 40–150)
Anion Gap: 7 (ref 5.0–15.0)
BUN: 5 mg/dL — ABNORMAL LOW (ref 7.0–19.0)
Bilirubin, Total: 0.2 mg/dL (ref 0.2–1.2)
CO2: 22 (ref 22–29)
Calcium: 9.2 mg/dL (ref 8.5–10.5)
Chloride: 112 — ABNORMAL HIGH (ref 98–107)
Creatinine: 0.6 mg/dL (ref 0.6–1.0)
Globulin: 2.6 g/dL (ref 2.0–3.6)
Glucose: 96 mg/dL (ref 70–100)
Potassium: 3.8 (ref 3.5–5.1)
Protein, Total: 5.3 g/dL — ABNORMAL LOW (ref 6.0–8.3)
Sodium: 141 (ref 136–145)

## 2012-10-07 LAB — MAGNESIUM: Magnesium: 1.6 mg/dL (ref 1.6–2.6)

## 2012-10-07 LAB — CELL MORPHOLOGY: Cell Morphology: NORMAL

## 2012-10-07 LAB — HEMOLYSIS INDEX: Hemolysis Index: 4 (ref 0–18)

## 2012-10-07 LAB — GFR: EGFR: >60

## 2012-10-07 MED ORDER — DOCUSATE SODIUM 100 MG PO CAPS
100.0000 mg | ORAL_CAPSULE | Freq: Two times a day (BID) | ORAL | Status: DC | PRN
Start: 2012-10-07 — End: 2012-10-09

## 2012-10-07 MED ORDER — POLYETHYLENE GLYCOL 3350 17 G PO PACK
17.0000 g | PACK | Freq: Every day | ORAL | Status: DC | PRN
Start: 2012-10-07 — End: 2012-10-09

## 2012-10-07 MED ORDER — MAGNESIUM OXIDE 400 MG PO TABS
400.0000 mg | ORAL_TABLET | Freq: Once | ORAL | Status: AC
Start: 2012-10-07 — End: 2012-10-07
  Administered 2012-10-07: 400 mg via ORAL
  Filled 2012-10-07: qty 1

## 2012-10-07 MED ORDER — ONDANSETRON HCL 4 MG/2ML IJ SOLN
4.0000 mg | Freq: Four times a day (QID) | INTRAMUSCULAR | Status: DC | PRN
Start: 2012-10-07 — End: 2012-10-09

## 2012-10-07 MED ORDER — ACETAMINOPHEN 325 MG PO TABS
650.0000 mg | ORAL_TABLET | Freq: Four times a day (QID) | ORAL | Status: DC | PRN
Start: 2012-10-07 — End: 2012-10-09

## 2012-10-07 MED ORDER — POTASSIUM & SODIUM PHOSPHATES 280-160-250 MG PO PACK
2.0000 | PACK | ORAL | Status: AC
Start: 2012-10-07 — End: 2012-10-07
  Administered 2012-10-07 (×2): 2 via ORAL
  Filled 2012-10-07 (×2): qty 2

## 2012-10-07 MED ORDER — ALTEPLASE 2 MG IJ SOLR
2.0000 mg | Freq: Once | INTRAMUSCULAR | Status: AC
Start: 2012-10-07 — End: 2012-10-07
  Administered 2012-10-07: 2 mg
  Filled 2012-10-07: qty 2

## 2012-10-07 MED ORDER — ZOLPIDEM TARTRATE 5 MG PO TABS
5.0000 mg | ORAL_TABLET | Freq: Every evening | ORAL | Status: DC | PRN
Start: 2012-10-07 — End: 2012-10-09

## 2012-10-07 NOTE — Progress Notes (Signed)
INTERNAL MEDICINE PROGRESS NOTE    Date: 09/24/201411:14 AM  Patient Name:[name] 57 y.o. female admitted with Febrile neutropenia  Patient status: Inpatient  Hospital Day: 3     Assessment:    Neutropenic fever - Tmax 99.3- no more neutropenic    AML   Hx of asthma    Pancytopenia - wbc improved   Hypotension - resolved    Hypophosphatemia     Plan:    Replete phos    Continue abx as per ID- d/[name] Dr.Choudhary   Heme/onc following    Continue other management    [place] plan once clear by ID and heme/onc    Subjective:   Denies pain .     Hospital problems:  Principal Problem:   *Febrile neutropenia  Active Problems:   Hypotension   Thrombocytopenia   Acute myeloid leukemia   Pancytopenia      Medications:      Scheduled Meds: PRN Meds:           acyclovir 400 mg Oral BID   allopurinol 100 mg Oral Daily   dexamethasone 1 drop Both Eyes BID   fluconazole 100 mg Oral Daily   lactobacillus/streoptococcus 1 capsule Oral Daily   meropenem 500 mg Intravenous Q6H   [COMPLETED] potassium chloride SA 40 mEq Oral Q4H   tbo-filgrastim 300 mcg Subcutaneous Daily at 1800   vancomycin 1,250 mg Intravenous Q8H   vitamin B-6 50 mg Oral Daily   [DISCONTINUED] vancomycin 1,000 mg Intravenous Q8H       Continuous Infusions:       . [DISCONTINUED] dextrose 5 % and 0.9% NaCl 75 mL/hr at 10/05/12 1055         acetaminophen 650 mg Q6H PRN   ondansetron 4 mg Q4H PRN   zolpidem 5 mg QHS PRN             Review of Systems:   General:  Fever/chills: -ve  HEENT: Negative for Headache,blurred vision,sore throat  Respiratory: Cough/ SOB- Absent  Cardiac: Chest pain/SOB- absent  Gastrointestinal: Nausea/vomiting/diarrhea/constipation-Absent  Genitourinary: No burning micturition.   Musculoskeletal:Negative for - joint pain, muscular weakness or swelling   CNS/Neurological:No headache,weakness,blurred vision     Physical Exam:   BP 100/57  Pulse 84  Temp 99 F (37.2 C) (Oral)  Resp 16  Ht 1.651 m (5\' 5" )  Wt 49.896 kg (110 lb)   BMI 18.31 kg/m2  SpO2 97%    Intake and Output Summary (Last 24 hours) at Date Time  No intake or output data in the 24 hours ending 10/07/12 1114    General appearance - alert, and in no distress  HEENT: NC/AT, o/p clear  Neck - supple  Chest - clear to auscultation  Heart - normal S1 and S2 and regular rhythm  Abdomen - bowel sounds +ve, soft, non distended  Extremities - no pedal edema  Neurological - Alert    Labs:       Lab 10/07/12 0543 10/06/12 0509 10/05/12 1043 10/04/12 1840   WBC 4.72 0.52* 0.16* --   HGB 8.1* 7.6* 8.4* --   HCT 24.6* 22.7* 25.3* --   PLT 23* 29* 6* --   MCV 93.9 94.6 94.4 --   NEUTRO 44 12 -- 0       Lab 10/07/12 0543 10/06/12 1215 10/06/12 0509 10/05/12 1043   NA 141 -- 141 137   K 3.8 -- 3.0* 3.9   CL 112* -- 112* 105   CO2 22 --  23 26   BUN 5.0* -- 8.0 11.0   CREAT 0.6 -- 0.7 0.6   GLU 96 -- 185* 114*   CA 9.2 -- 8.8 9.6   MG 1.6 1.8 -- --   PHOS 1.6* -- -- --   PROT 5.3* -- 4.8* 5.4*   ALB 2.7* -- 2.5* 3.0*   AST 17 -- 14 14   ALT 26 -- 20 20   ALKPHOS 72 -- 71 88   BILITOTAL 0.2 -- 0.2 0.4     Glucose:      Lab 10/07/12 0543 10/06/12 0509 10/05/12 1043 10/04/12 1840   GLU 96 185* 114* 99     Microbiology Results     Procedure Component Value Units Date/Time    Blood Culture #1 [[id number]] Collected:10/04/12 1840    Specimen Information:Blood / Blood Updated:10/07/12 0025    Narrative:    ORDER#: [id number]                                    ORDERED BY: Silvio Pate  SOURCE: Blood peripheral                             COLLECTED:  10/04/12 18:40  ANTIBIOTICS AT COLL.:                                RECEIVED :  10/04/12 23:46  Culture Blood                              PRELIM      10/07/12 00:21  10/06/12   No Growth after 1 day/s of incubation.  10/07/12   No Growth after 2 day/s of incubation.      Blood Culture #2 [[id number]] Collected:10/04/12 1840    Specimen Information:Blood / Blood Updated:10/07/12 0025    Narrative:    ORDER#: [id number]                                     ORDERED BY: Silvio Pate  SOURCE: Blood peripheral                             COLLECTED:  10/04/12 18:40  ANTIBIOTICS AT COLL.:                                RECEIVED :  10/04/12 23:46  Culture Blood                              PRELIM      10/07/12 00:21  10/06/12   No Growth after 1 day/s of incubation.  10/07/12   No Growth after 2 day/s of incubation.      Culture Blood, Aerobic [[id number]] Collected:10/05/12 1626    Specimen Information:Blood / Blood, Intravenous Line Updated:10/06/12 2015    Narrative:    ORDER#: [id number]  ORDERED BY: CHOUDHARY, IMTI  SOURCE: Blood, Intravenous Line groshong             COLLECTED:  10/05/12 16:26  ANTIBIOTICS AT COLL.:                                RECEIVED :  10/05/12 20:12  Culture Blood                              PRELIM      10/06/12 20:15  10/06/12   No Growth after 1 day/s of incubation.      Culture Blood, Aerobic [[id number]] Collected:10/05/12 1626    Specimen Information:Blood / Blood, Venipuncture Updated:10/06/12 2015    Narrative:    ORDER#: [id number]                                    ORDERED BY: CHOUDHARY, IMTI  SOURCE: Blood, Venipuncture peripheral               COLLECTED:  10/05/12 16:26  ANTIBIOTICS AT COLL.:                                RECEIVED :  10/05/12 20:12  Culture Blood                              PRELIM      10/06/12 20:15  10/06/12   No Growth after 1 day/s of incubation.      Urine culture [[id number]] Collected:10/04/12 1916    Specimen Information:Urine / Urine, Clean Catch Updated:10/05/12 2157    Narrative:    ORDER#: [id number]                                    ORDERED BY: Silvio Pate  SOURCE: Urine, Clean Catch                           COLLECTED:  10/04/12 19:16  ANTIBIOTICS AT COLL.:                                RECEIVED :  10/04/12 23:13  Culture Urine                              FINAL       10/05/12 21:57  10/05/12   1,000 - 9,000 CFU/ML of normal urogenital or skin microbiota, no  further             work            Rads:   Xr Chest 2 Views    10/04/2012    No evidence of acute process.  Aquilla Hacker, MD  10/04/2012 7:21 PM         Christophe Louis, DO  Internal Medicine  10/07/2012  11:14 AM

## 2012-10-07 NOTE — Plan of Care (Addendum)
Problem: Protective Precautions [in description add neutropenic precautions]  Goal: Free from nosocomial infections  Outcome: Progressing  Patient previously on neutropenic isolation, patient/family are educated on change in isolation status.    Problem: Pain  Goal: Patient's pain/discomfort is manageable  Outcome: Progressing  Having slight headache.  Tylenol given before start of shift.  No request for additional tylenol.      Comments:   Took several walks around unit.  Wearing mask when out of room for precautions.  No temperature during shift.    Update:  1900.  Temp taken - 99.7.  Tylenol given.

## 2012-10-07 NOTE — Progress Notes (Signed)
Infectious Diseases & Tropical Medicine  Progress Note    10/07/2012   [name] ZOX:[id number],XBJ:[id number] is a 57 y.o. female,       Assessment:      Acute myeloid leukemia s/p chemotherapy and neupogen   Neutropenia resolved   Fever improving   Blood cultures - NGTD (10/04/12 & 10/05/12)   H/O Asthma   Clinically stable    Plan:      Continue vancomycin and meropenem until fever is resolved     Pharmacy monitoring vancomycin levels   Continue diflucan   If fever is resolved by tomorrow and cultures remain negative - will switch to PO Abx   Continue probiotics     Monitor clinically    ROS:     General:  Fever improving, no chills, no rigor, awake, alert, ambulating in the room. No new issues.  HEENT: no neck pain, no throat pain, no sinus congestion  Endocrine: c/o fatigue   Respiratory: no cough, shortness of breath, or wheezing   Cardiovascular: no chest pain   Gastrointestinal: no abdominal pain,no N/V/D  Genito-Urinary: no dysuria, trouble voiding, or hematuria   Musculoskeletal: no edema  Neurological: c/o generalized weakness   Dermatological: no rash, no ulcer    Physical Examination:     Blood pressure 100/57, pulse 84, temperature 99 F (37.2 C), temperature source Oral, resp. rate 16, height 1.651 m (5\' 5" ), weight 49.896 kg (110 lb), SpO2 97.00%.     General Appearance: Comfortable, and in no acute distress.  Awake and alert.   HEENT: Pupils are equal, round, and reactive to light.    Lungs:  CTA   Heart: RRR   Chest: Symmetric chest wall expansion.    Abdomen: soft ,non tender,no hepatosplenomegaly   Neurological: No focal deficit   Extremities: No edema    Laboratory And Diagnostic Studies:     Recent Labs   [facility] 10/07/12 0543 10/06/12 0509    WBC 4.72 0.52*    HGB 8.1* 7.6*    HCT 24.6* 22.7*    PLT 23* 29*     Recent Labs   [facility] 10/07/12 0543 10/06/12 0509    NA 141 141    K 3.8 3.0*    CL 112* 112*    CO2 22 23    BUN 5.0* 8.0    CREAT 0.6 0.7    GLU 96 185*     CA 9.2 8.8     Recent Labs   Basename 10/07/12 0543 10/06/12 0509    AST 17 14    ALT 26 20    ALKPHOS 72 71    PROT 5.3* 4.8*    ALB 2.7* 2.5*       Current Meds:      Scheduled Meds: PRN Meds:           acyclovir 400 mg Oral BID   allopurinol 100 mg Oral Daily   dexamethasone 1 drop Both Eyes BID   fluconazole 100 mg Oral Daily   lactobacillus/streoptococcus 1 capsule Oral Daily   meropenem 500 mg Intravenous Q6H   [COMPLETED] potassium chloride SA 40 mEq Oral Q4H   tbo-filgrastim 300 mcg Subcutaneous Daily at 1800   vancomycin 1,250 mg Intravenous Q8H   vitamin B-6 50 mg Oral Daily   [DISCONTINUED] vancomycin 1,000 mg Intravenous Q8H       Continuous Infusions:       . [DISCONTINUED] dextrose 5 % and 0.9% NaCl 75 mL/hr at 10/05/12 1055  acetaminophen 650 mg Q6H PRN   ondansetron 4 mg Q4H PRN   zolpidem 5 mg QHS PRN         [name] A. Janalyn Rouse, M.D.  10/07/2012  8:49 AM

## 2012-10-07 NOTE — Progress Notes (Signed)
HEMATOLOGY ONCOLOGY PROGRESS NOTE    Date Time: 10/07/2012 1:44 PM  Patient Name: [name],[name]      IMPRESSION:     Patient Active Problem List   Diagnosis   . Chondromalacia of patella   . Neutropenic fever   . Febrile neutropenia   . Hypotension   . Thrombocytopenia   . Acute myeloid leukemia   . Pancytopenia         PLAN:  Will discontinue granix after today's dose.  From an oncology point of view there is no contraindication to discharge in the AM if platelets adequate.             HISTORY: 57 yo with AML s/p induction chemotherapy with the appearance of a CR who is currently undergoing consolidation with high-dose Ara-C and was admitted with a neutropenic fever. This is her first consolidation cycle. Today is day 15 following initiation of high-dose Ara-C.  Interval History:  Afebrile.  She feels much better.  She remains fatigued but is ambulating without difficulty.       PHYSICAL EXAM:     Filed Vitals:    10/07/12 0744   BP: 100/57   Pulse: 84   Temp: 99 F (37.2 C)   Resp: 16   SpO2: 97%       Intake and Output Summary (Last 24 hours) at Date Time  No intake or output data in the 24 hours ending 10/07/12 1344    Appearance: in no acute distress  HEENT: pharynx clear  Lungs: clear to ausculation  Heart: regular rate and rhythm, normal heart sounds  Abdomen: flat and soft, active bowel sounds  Extremities: no edema  Neuro: alert, appropriate  MEDS:   Scheduled Meds:  Current Facility-Administered Medications   Medication Dose Route Frequency   . acyclovir  400 mg Oral BID   . allopurinol  100 mg Oral Daily   . dexamethasone  1 drop Both Eyes BID   . fluconazole  100 mg Oral Daily   . lactobacillus/streoptococcus  1 capsule Oral Daily   . [COMPLETED] magnesium oxide  400 mg Oral Once   . meropenem  500 mg Intravenous Q6H   . potassium & sodium phosphates  2 packet Oral Q4H   . [COMPLETED] potassium chloride SA  40 mEq Oral Q4H   . tbo-filgrastim  300 mcg Subcutaneous Daily at 1800   . vancomycin  1,250  mg Intravenous Q8H   . vitamin B-6  50 mg Oral Daily   . [DISCONTINUED] vancomycin  1,000 mg Intravenous Q8H     Continuous Infusions:     . [DISCONTINUED] dextrose 5 % and 0.9% NaCl 75 mL/hr at 10/05/12 1055     PRN Meds:.acetaminophen, acetaminophen, docusate sodium, ondansetron, ondansetron, polyethylene glycol, zolpidem, [DISCONTINUED] zolpidem    LABS:     Results     Procedure Component Value Units Date/Time    Manual Differential [[id number]]  (Abnormal) Collected:10/07/12 0543     Segmented Neutrophils 44 % Updated:10/07/12 0812     Band Neutrophils 5 %      Lymphocytes Manual 19 %      Monocytes Manual 31 %      Eosinophils Manual 1 %      Basophils Manual 0 %      Nucleated RBC 0      Abs Seg Manual 2.08      Bands Absolute 0.24      Absolute Lymph Manual 0.90      Monocytes Absolute  1.46 (H)      Absolute Eos Manual 0.05      Absolute Baso Manual 0.00     Cell MorpHology [[id number]] Collected:10/07/12 0543     Cell Morphology: Normal Updated:10/07/12 0812    CBC and differential [[id number]]  (Abnormal) Collected:10/07/12 0543    Specimen Information:Blood / Blood Updated:10/07/12 0812     WBC 4.72      RBC 2.62 (L)      Hgb 8.1 (L) g/dL      Hematocrit 78.2 (L) %      MCV 93.9 fL      MCH 30.9 pg      MCHC 32.9 g/dL      RDW 13 %      Platelets 23 (L)      MPV 10.9 fL     GFR [[id number]] Collected:10/07/12 0543     EGFR >60.0 Updated:10/07/12 0719    Magnesium [[id number]] Collected:10/07/12 0543    Specimen Information:Blood Updated:10/07/12 0719     Magnesium 1.6 mg/dL     Phosphorus [[id number]]  (Abnormal) Collected:10/07/12 0543    Specimen Information:Blood Updated:10/07/12 0719     Phosphorus 1.6 (L) mg/dL     Hemolysis index [[id number]] Collected:10/07/12 0543     Hemolysis Index 4 Updated:10/07/12 0719    Comprehensive metabolic panel [[id number]]  (Abnormal) Collected:10/07/12 0543    Specimen Information:Blood Updated:10/07/12 0719     Glucose 96 mg/dL      BUN 5.0 (L) mg/dL      Creatinine 0.6  mg/dL      Sodium 595      Potassium 3.8      Chloride 112 (H)      CO2 22      CALCIUM 9.2 mg/dL      Protein, Total 5.3 (L) g/dL      Albumin 2.7 (L) g/dL      AST (SGOT) 17 U/L      ALT 26 U/L      Alkaline Phosphatase 72 U/L      Bilirubin, Total 0.2 mg/dL      Globulin 2.6 g/dL      Albumin/Globulin Ratio 1.0      Anion Gap 7.0     CBC WITH MANUAL DIFFERENTIAL [[id number]] Collected:10/07/12 0543     Updated:10/07/12 0543    Blood Culture #1 [[id number]] Collected:10/04/12 1840    Specimen Information:Blood / Blood Updated:10/07/12 0025    Narrative:    ORDER#: [id number]                                    ORDERED BY: Silvio Pate  SOURCE: Blood peripheral                             COLLECTED:  10/04/12 18:40  ANTIBIOTICS AT COLL.:                                RECEIVED :  10/04/12 23:46  Culture Blood                              PRELIM      10/07/12 00:21  10/06/12   No Growth after 1 day/s of incubation.  10/07/12   No Growth after 2 day/s of incubation.  Blood Culture #2 [[id number]] Collected:10/04/12 1840    Specimen Information:Blood / Blood Updated:10/07/12 0025    Narrative:    ORDER#: [id number]                                    ORDERED BY: Silvio Pate  SOURCE: Blood peripheral                             COLLECTED:  10/04/12 18:40  ANTIBIOTICS AT COLL.:                                RECEIVED :  10/04/12 23:46  Culture Blood                              PRELIM      10/07/12 00:21  10/06/12   No Growth after 1 day/s of incubation.  10/07/12   No Growth after 2 day/s of incubation.      Culture Blood, Aerobic [[id number]] Collected:10/05/12 1626    Specimen Information:Blood / Blood, Intravenous Line Updated:10/06/12 2015    Narrative:    ORDER#: [id number]                                    ORDERED BY: CHOUDHARY, IMTI  SOURCE: Blood, Intravenous Line groshong             COLLECTED:  10/05/12 16:26  ANTIBIOTICS AT COLL.:                                RECEIVED :  10/05/12 20:12  Culture Blood                               PRELIM      10/06/12 20:15  10/06/12   No Growth after 1 day/s of incubation.      Culture Blood, Aerobic [[id number]] Collected:10/05/12 1626    Specimen Information:Blood / Blood, Venipuncture Updated:10/06/12 2015    Narrative:    ORDER#: [id number]                                    ORDERED BY: CHOUDHARY, IMTI  SOURCE: Blood, Venipuncture peripheral               COLLECTED:  10/05/12 16:26  ANTIBIOTICS AT COLL.:                                RECEIVED :  10/05/12 20:12  Culture Blood                              PRELIM      10/06/12 20:15  10/06/12   No Growth after 1 day/s of incubation.      Magnesium [[id number]] Collected:10/06/12 1215    Specimen Information:Blood Updated:10/06/12 1653     Magnesium 1.8 mg/dL     Hemolysis index [[id number]] Collected:10/06/12 1215  Hemolysis Index 7 Updated:10/06/12 1653          IMAGING DATA:  Xr Chest 2 Views    10/04/2012  INDICATION: fever  TECHNIQUE:  PA and lateral radiographs of the chest were obtained.    FINDINGS:  The heart size is within normal limits.  The lungs are clear of acute disease.  There is no pneumothorax or pleural effusion. There is a central venous catheter in the SVC.       10/04/2012    No evidence of acute process.  Aquilla Hacker, MD  10/04/2012 7:21 PM                       Larene Pickett, MD  [facility]    [phone]

## 2012-10-07 NOTE — Plan of Care (Signed)
Problem: Hemodynamic Status: Immune Suppressed-Hematologic  Goal: Stable vital signs and fluid balance  Outcome: Progressing  Neutropenic prec. Measures maintained.v/s monitored.on i.v.antibiotics.will cont. To monitor pt. Plan of care and treatments closely.

## 2012-10-08 ENCOUNTER — Inpatient Hospital Stay: Payer: 59

## 2012-10-08 LAB — COMPREHENSIVE METABOLIC PANEL
ALT: 28 U/L (ref 0–55)
AST (SGOT): 22 U/L (ref 5–34)
Albumin/Globulin Ratio: 1.2 (ref 0.9–2.2)
Albumin: 2.8 g/dL — ABNORMAL LOW (ref 3.5–5.0)
Alkaline Phosphatase: 112 U/L (ref 40–150)
Anion Gap: 9 (ref 5.0–15.0)
BUN: 9 mg/dL (ref 7.0–19.0)
Bilirubin, Total: 0.1 mg/dL — ABNORMAL LOW (ref 0.2–1.2)
CO2: 25 (ref 22–29)
Calcium: 9.7 mg/dL (ref 8.5–10.5)
Chloride: 109 — ABNORMAL HIGH (ref 98–107)
Creatinine: 0.6 mg/dL (ref 0.6–1.0)
Globulin: 2.3 g/dL (ref 2.0–3.6)
Glucose: 67 mg/dL — ABNORMAL LOW (ref 70–100)
Potassium: 3.8 (ref 3.5–5.1)
Protein, Total: 5.1 g/dL — ABNORMAL LOW (ref 6.0–8.3)
Sodium: 143 (ref 136–145)

## 2012-10-08 LAB — CBC WITH MANUAL DIFFERENTIAL
Band Neutrophils Absolute: 1.24 — ABNORMAL HIGH (ref 0.00–1.00)
Band Neutrophils: 7 %
Basophils Absolute Manual: 0 (ref 0.00–0.20)
Basophils Manual: 0 %
Cell Morphology: NORMAL
Eosinophils Absolute Manual: 0 (ref 0.00–0.70)
Eosinophils Manual: 0 %
Hematocrit: 24.1 % — ABNORMAL LOW (ref 37.0–47.0)
Hgb: 8 g/dL — ABNORMAL LOW (ref 12.0–16.0)
Lymphocytes Absolute Manual: 1.85 (ref 0.50–4.40)
Lymphocytes Manual: 11 %
MCH: 31.4 pg (ref 28.0–32.0)
MCHC: 33.2 g/dL (ref 32.0–36.0)
MCV: 94.5 fL (ref 80.0–100.0)
MPV: 10.6 fL (ref 9.4–12.3)
Metamyelocytes Absolute: 0.18 — ABNORMAL HIGH
Metamyelocytes: 1 %
Monocytes Absolute: 3.27 — ABNORMAL HIGH (ref 0.00–1.20)
Monocytes Manual: 19 %
Myelocytes Absolute: 0.62 — ABNORMAL HIGH
Myelocytes: 4 %
Neutrophils Absolute Manual: 10.5 — ABNORMAL HIGH (ref 1.80–8.10)
Nucleated RBC: 0 (ref 0–1)
Platelets: 29 — ABNORMAL LOW (ref 140–400)
RBC: 2.55 — ABNORMAL LOW (ref 4.20–5.40)
RDW: 13 % (ref 12–15)
Segmented Neutrophils: 60 %
WBC: 17.65 — ABNORMAL HIGH (ref 3.50–10.80)

## 2012-10-08 LAB — VANCOMYCIN, TROUGH: Vancomycin Trough: 19.5 ug/mL (ref 10.0–20.0)

## 2012-10-08 LAB — GFR: EGFR: >60

## 2012-10-08 LAB — PHOSPHORUS: Phosphorus: 2.7 mg/dL (ref 2.3–4.7)

## 2012-10-08 LAB — MAGNESIUM: Magnesium: 1.8 mg/dL (ref 1.6–2.6)

## 2012-10-08 LAB — HEMOLYSIS INDEX: Hemolysis Index: 1 (ref 0–18)

## 2012-10-08 MED ORDER — LEVOFLOXACIN 500 MG PO TABS
500.0000 mg | ORAL_TABLET | Freq: Every day | ORAL | Status: DC
Start: 2012-10-08 — End: 2012-10-09

## 2012-10-08 MED ORDER — FLUCONAZOLE 100 MG PO TABS
100.0000 mg | ORAL_TABLET | Freq: Every day | ORAL | Status: DC
Start: 2012-10-08 — End: 2012-10-09

## 2012-10-08 MED ORDER — FAMOTIDINE 10 MG/ML IV SOLN (WRAP)
20.0000 mg | Freq: Two times a day (BID) | INTRAVENOUS | Status: DC
Start: 2012-10-08 — End: 2012-10-09
  Administered 2012-10-08 – 2012-10-09 (×3): 20 mg via INTRAVENOUS
  Filled 2012-10-08 (×3): qty 2

## 2012-10-08 MED ORDER — LEVOFLOXACIN IN D5W 500 MG/100ML IV SOLN
500.0000 mg | Freq: Every day | INTRAVENOUS | Status: DC
Start: 2012-10-08 — End: 2012-10-09
  Administered 2012-10-08 – 2012-10-09 (×2): 500 mg via INTRAVENOUS
  Filled 2012-10-08 (×2): qty 100

## 2012-10-08 NOTE — Progress Notes (Signed)
INTERNAL MEDICINE PROGRESS NOTE    Date: 09/25/201410:33 AM  Patient Name:[name] 57 y.o. female admitted with Febrile neutropenia  Patient status: Inpatient  Hospital Day: 4     Assessment:    Neutropenic fever - Tmax 99.3- no more neutropenic    AML   Hx of asthma    Pancytopenia - wbc improved   Thrombocytopenia - still persistent    Headache    Hypophosphatemia - resolved    Plan:    CT head as per oncology   Continue abx as per ID- d/[name] Dr.Choudhary   Heme/onc following    Continue other management    [place] plan once clear by heme/onc and improved headache    Subjective:   C/o Headache and feeling weak and tired.     Hospital problems:  Principal Problem:   *Febrile neutropenia  Active Problems:   Hypotension   Thrombocytopenia   Acute myeloid leukemia   Pancytopenia      Medications:      Scheduled Meds: PRN Meds:           allopurinol 100 mg Oral Daily   [COMPLETED] alteplase 2 mg Intracatheter Once   dexamethasone 1 drop Both Eyes BID   famotidine 20 mg Intravenous Q12H [facility]   lactobacillus/streoptococcus 1 capsule Oral Daily   levofloxacin 500 mg Intravenous Daily   [COMPLETED] magnesium oxide 400 mg Oral Once   [COMPLETED] potassium & sodium phosphates 2 packet Oral Q4H   vitamin B-6 50 mg Oral Daily   [DISCONTINUED] acyclovir 400 mg Oral BID   [DISCONTINUED] fluconazole 100 mg Oral Daily   [DISCONTINUED] meropenem 500 mg Intravenous Q6H   [DISCONTINUED] tbo-filgrastim 300 mcg Subcutaneous Daily at 1800   [DISCONTINUED] vancomycin 1,250 mg Intravenous Q8H       Continuous Infusions:         acetaminophen 650 mg Q6H PRN   acetaminophen 650 mg Q6H PRN   docusate sodium 100 mg Q12H PRN   ondansetron 4 mg Q4H PRN   ondansetron 4 mg Q6H PRN   polyethylene glycol 17 g QD PRN   zolpidem 5 mg QHS PRN   [DISCONTINUED] zolpidem 5 mg QHS PRN             Review of Systems:   General:  Fever/chills: -ve  HEENT: Negative for blurred vision,sore throat, +ve HA  Respiratory: Cough/ SOB- Absent  Cardiac:  Chest pain/SOB- absent  Gastrointestinal: Nausea/vomiting/diarrhea/constipation-Absent  Genitourinary: No burning micturition.   Musculoskeletal:Negative for - joint pain, muscular weakness or swelling   CNS/Neurological:No headache,weakness,blurred vision     Physical Exam:   BP 105/58  Pulse 92  Temp 99 F (37.2 C) (Oral)  Resp 16  Ht 1.651 m (5\' 5" )  Wt 49.896 kg (110 lb)  BMI 18.31 kg/m2  SpO2 95%    Intake and Output Summary (Last 24 hours) at Date Time  No intake or output data in the 24 hours ending 10/08/12 1033    General appearance - alert, and in no distress  HEENT: NC/AT, o/p clear  Neck - supple  Chest - clear to auscultation  Heart - normal S1 and S2 and regular rhythm  Abdomen - bowel sounds +ve, soft, non distended  Extremities - no pedal edema  Neurological - Alert,    Labs:       Lab 10/08/12 0322 10/07/12 0543 10/06/12 0509   WBC 17.65* 4.72 0.52*   HGB 8.0* 8.1* 7.6*   HCT 24.1* 24.6* 22.7*   PLT  29* 23* 29*   MCV 94.5 93.9 94.6   NEUTRO 60 44 12       Lab 10/08/12 0322 10/07/12 0543 10/06/12 1215 10/06/12 0509   NA 143 141 -- 141   K 3.8 3.8 -- 3.0*   CL 109* 112* -- 112*   CO2 25 22 -- 23   BUN 9.0 5.0* -- 8.0   CREAT 0.6 0.6 -- 0.7   GLU 67* 96 -- 185*   CA 9.7 9.2 -- 8.8   MG 1.8 1.6 1.8 --   PHOS 2.7 1.6* -- --   PROT 5.1* 5.3* -- 4.8*   ALB 2.8* 2.7* -- 2.5*   AST 22 17 -- 14   ALT 28 26 -- 20   ALKPHOS 112 72 -- 71   BILITOTAL 0.1* 0.2 -- 0.2     Glucose:      Lab 10/08/12 0322 10/07/12 0543 10/06/12 0509 10/05/12 1043 10/04/12 1840   GLU 67* 96 185* 114* 99     Microbiology Results     Procedure Component Value Units Date/Time    Blood Culture #1 [[id number]] Collected:10/04/12 1840    Specimen Information:Blood / Blood Updated:10/08/12 0025    Narrative:    ORDER#: [id number]                                    ORDERED BY: Silvio Pate  SOURCE: Blood peripheral                             COLLECTED:  10/04/12 18:40  ANTIBIOTICS AT COLL.:                                RECEIVED :   10/04/12 23:46  Culture Blood                              PRELIM      10/08/12 00:21  10/06/12   No Growth after 1 day/s of incubation.  10/07/12   No Growth after 2 day/s of incubation.  10/08/12   No Growth after 3 day/s of incubation.      Blood Culture #2 [[id number]] Collected:10/04/12 1840    Specimen Information:Blood / Blood Updated:10/08/12 0025    Narrative:    ORDER#: [id number]                                    ORDERED BY: Silvio Pate  SOURCE: Blood peripheral                             COLLECTED:  10/04/12 18:40  ANTIBIOTICS AT COLL.:                                RECEIVED :  10/04/12 23:46  Culture Blood                              PRELIM      10/08/12 00:21  10/06/12   No Growth after 1 day/s of incubation.  10/07/12  No Growth after 2 day/s of incubation.  10/08/12   No Growth after 3 day/s of incubation.      Culture Blood, Aerobic [[id number]] Collected:10/05/12 1626    Specimen Information:Blood / Blood, Intravenous Line Updated:10/07/12 2015    Narrative:    ORDER#: [id number]                                    ORDERED BY: CHOUDHARY, IMTI  SOURCE: Blood, Intravenous Line groshong             COLLECTED:  10/05/12 16:26  ANTIBIOTICS AT COLL.:                                RECEIVED :  10/05/12 20:12  Culture Blood                              PRELIM      10/07/12 20:15  10/06/12   No Growth after 1 day/s of incubation.  10/07/12   No Growth after 2 day/s of incubation.      Culture Blood, Aerobic [[id number]] Collected:10/05/12 1626    Specimen Information:Blood / Blood, Venipuncture Updated:10/07/12 2015    Narrative:    ORDER#: [id number]                                    ORDERED BY: CHOUDHARY, IMTI  SOURCE: Blood, Venipuncture peripheral               COLLECTED:  10/05/12 16:26  ANTIBIOTICS AT COLL.:                                RECEIVED :  10/05/12 20:12  Culture Blood                              PRELIM      10/07/12 20:15  10/06/12   No Growth after 1 day/s of incubation.  10/07/12    No Growth after 2 day/s of incubation.      Urine culture [[id number]] Collected:10/04/12 1916    Specimen Information:Urine / Urine, Clean Catch Updated:10/05/12 2157    Narrative:    ORDER#: [id number]                                    ORDERED BY: Silvio Pate  SOURCE: Urine, Clean Catch                           COLLECTED:  10/04/12 19:16  ANTIBIOTICS AT COLL.:                                RECEIVED :  10/04/12 23:13  Culture Urine                              FINAL       10/05/12 21:57  10/05/12   1,000 - 9,000 CFU/ML  of normal urogenital or skin microbiota, no further             work            Rads:   Xr Chest 2 Views    10/04/2012    No evidence of acute process.  Aquilla Hacker, MD  10/04/2012 7:21 PM         Christophe Louis, DO  Internal Medicine  10/08/2012  10:33 AM

## 2012-10-08 NOTE — Plan of Care (Signed)
Problem: Hemodynamic Status: Immune Suppressed-Hematologic  Goal: Stable vital signs and fluid balance  Outcome: Progressing  Vitals stable, afebrile.  Observed patient binging diaphoretic.  Denies chills, SOB/chest pain.  IV antibiotics given as ordered.     Problem: Pain  Goal: Patient's pain/discomfort is manageable  Outcome: Progressing  Denies pain this shift.    Comments:   Vanco trough 19.5.  Morning labs pending.

## 2012-10-08 NOTE — Progress Notes (Signed)
Infectious Diseases [facility] T[facility]  Progress Note    10/08/2012   [name] NGE:[id number],NUU:[id number] is a 57 y.o. 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 Richardson Chiquito    ROS:     General:  Fever improved, no chills, no rigor, awake, alert, ambulating in the room. Feeling better  HEENT: no neck pain, no throat pain, no sinus congestion, c/o headache  Endocrine: c/o fatigue   Respiratory: no cough, shortness of breath, or wheezing   Cardiovascular: no chest pain   Gastrointestinal: no abdominal pain,no N/V/D  Genito-Urinary: no dysuria, trouble voiding, or hematuria   Musculoskeletal: no edema  Neurological: c/o generalized weakness   Dermatological: no rash, no ulcer    Physical Examination:     Blood pressure 105/58, pulse 92, temperature 99 F (37.2 C), temperature source Oral, resp. rate 16, height 1.651 m (5\' 5" ), weight 49.896 kg (110 lb), SpO2 95.00%.     General Appearance: Comfortable, and in no acute distress.  Awake and alert. Ambulating   HEENT: Pupils are equal, round, and reactive to light.    Lungs:  CTA   Heart: RRR   Chest: Symmetric chest wall expansion.    Abdomen: soft ,non tender,no hepatosplenomegaly   Neurological: No focal deficit   Extremities: No edema    Laboratory And Diagnostic Studies:     Recent Labs   [facility] 10/08/12 0322 10/07/12 0543    WBC 17.65* 4.72    HGB 8.0* 8.1*    HCT 24.1* 24.6*    PLT 29* 23*     Recent Labs   [facility] 10/08/12 0322 10/07/12 0543    NA 143 141    K 3.8 3.8    CL 109* 112*    CO2 25 22    BUN 9.0 5.0*    CREAT 0.6 0.6    GLU 67* 96    CA 9.7 9.2     Recent Labs   [facility]ename 10/08/12 0322  10/07/12 0543    AST 22 17    ALT 28 26    ALKPHOS 112 72    PROT 5.1* 5.3*    ALB 2.8* 2.7*       Current Meds:      Scheduled Meds: PRN Meds:           acyclovir 400 mg Oral BID   allopurinol 100 mg Oral Daily   [COMPLETED] alteplase 2 mg Intracatheter Once   dexamethasone 1 drop Both Eyes BID   fluconazole 100 mg Oral Daily   lactobacillus/streoptococcus 1 capsule Oral Daily   [COMPLETED] magnesium oxide 400 mg Oral Once   meropenem 500 mg Intravenous Q6H   [COMPLETED] potassium & sodium phosphates 2 packet Oral Q4H   tbo-filgrastim 300 mcg Subcutaneous Daily at 1800   vancomycin 1,250 mg Intravenous Q8H   vitamin B-6 50 mg Oral Daily       Continuous Infusions:         acetaminophen 650 mg Q6H PRN   acetaminophen 650 mg Q6H  PRN   docusate sodium 100 mg Q12H PRN   ondansetron 4 mg Q4H PRN   ondansetron 4 mg Q6H PRN   polyethylene glycol 17 g QD PRN   zolpidem 5 mg QHS PRN   [DISCONTINUED] zolpidem 5 mg QHS PRN         Haywood Meinders A. Janalyn Rouse, M.D.  10/08/2012  8:50 AM

## 2012-10-08 NOTE — Progress Notes (Signed)
HEMATOLOGY ONCOLOGY PROGRESS NOTE    Date Time: 10/08/2012 9:50 AM  Patient Name: [name],[name]      IMPRESSION:     Patient Active Problem List   Diagnosis   . Chondromalacia of patella   . Neutropenic fever   . Febrile neutropenia   . Hypotension   . Thrombocytopenia   . Acute myeloid leukemia   . Pancytopenia         PLAN:  Acyclovir, diflucan and granix will be discontinued.  Pepcid will be initiated as some degree of nausea may be related to gastritis associated with chemotherapy.  In view of persistent thrombocytopenia and progressive headache, a non-contrast CT of head will be obtained.  Patient was in agreement.             HISTORY: 57 yo with AML s/p induction chemotherapy with the appearance of a CR who is currently undergoing consolidation with high-dose Ara-C and was admitted with a neutropenic fever. This is her first consolidation cycle. Today is day 16 following initiation of high-dose Ara-C.   Interval History:  Appetite is poor.  Low level nausea persists.  She has a progressively severe headache not relieved with tyelenol.         PHYSICAL EXAM:     Filed Vitals:    10/08/12 0646   BP: 105/58   Pulse: 92   Temp: 99 F (37.2 C)   Resp: 16   SpO2: 95%       Intake and Output Summary (Last 24 hours) at Date Time  No intake or output data in the 24 hours ending 10/08/12 0950    Appearance: in mild distress secondary to headache which is frontal and parietal.  HEENT: pharynx clear  Lungs: clear to ausculation  Heart: regular rate and rhythm, normal heart sounds  Abdomen: flat and soft, active bowel sounds  Extremities: no edema  Neuro: alert, appropriate  MEDS:   Scheduled Meds:  Current Facility-Administered Medications   Medication Dose Route Frequency   . allopurinol  100 mg Oral Daily   . [COMPLETED] alteplase  2 mg Intracatheter Once   . dexamethasone  1 drop Both Eyes BID   . famotidine  20 mg Intravenous Q12H SCH   . lactobacillus/streoptococcus  1 capsule Oral Daily   . [COMPLETED] magnesium  oxide  400 mg Oral Once   . meropenem  500 mg Intravenous Q6H   . [COMPLETED] potassium & sodium phosphates  2 packet Oral Q4H   . vancomycin  1,250 mg Intravenous Q8H   . vitamin B-6  50 mg Oral Daily   . [DISCONTINUED] acyclovir  400 mg Oral BID   . [DISCONTINUED] fluconazole  100 mg Oral Daily   . [DISCONTINUED] tbo-filgrastim  300 mcg Subcutaneous Daily at 1800     Continuous Infusions:   PRN Meds:.acetaminophen, acetaminophen, docusate sodium, ondansetron, ondansetron, polyethylene glycol, zolpidem, [DISCONTINUED] zolpidem    LABS:     Results     Procedure Component Value Units Date/Time    CBC WITH MANUAL DIFFERENTIAL [[id number]]  (Abnormal) Collected:10/08/12 0322     WBC 17.65 (H) Updated:10/08/12 0834     RBC 2.55 (L)      Hgb 8.0 (L) g/dL      Hematocrit 74.2 (L) %      MCV 94.5 fL      MCH 31.4 pg      MCHC 33.2 g/dL      RDW 13 %      Platelets 29 (L)  MPV 10.6 fL      Segmented Neutrophils 60 %      Band Neutrophils 7 %      Lymphocytes Manual 11 %      Monocytes Manual 19 %      Eosinophils Manual 0 %      Basophils Manual 0 %      Metamyelocytes 1 %      Myelocytes 4 %      Nucleated RBC 0      Abs Seg Manual 10.50 (H)      Bands Absolute 1.24 (H)      Absolute Lymph Manual 1.85      Monocytes Absolute 3.27 (H)      Absolute Eos Manual 0.00      Absolute Baso Manual 0.00      Metamyelocytes Absolute 0.18 (H)      Absolute Myelocyte 0.62 (H)      Cell Morphology: Normal     Comprehensive metabolic panel [[id number]]  (Abnormal) Collected:10/08/12 0322    Specimen Information:Blood Updated:10/08/12 0651     Glucose 67 (L) mg/dL      BUN 9.0 mg/dL      Creatinine 0.6 mg/dL      Sodium 409      Potassium 3.8      Chloride 109 (H)      CO2 25      CALCIUM 9.7 mg/dL      Protein, Total 5.1 (L) g/dL      Albumin 2.8 (L) g/dL      AST (SGOT) 22 U/L      ALT 28 U/L      Alkaline Phosphatase 112 U/L      Bilirubin, Total 0.1 (L) mg/dL      Globulin 2.3 g/dL      Albumin/Globulin Ratio 1.2      Anion Gap 9.0      Magnesium [[id number]] Collected:10/08/12 0322    Specimen Information:Blood Updated:10/08/12 0651     Magnesium 1.8 mg/dL     Phosphorus [[id number]] Collected:10/08/12 0322    Specimen Information:Blood Updated:10/08/12 0651     Phosphorus 2.7 mg/dL     Hemolysis index [[id number]] Collected:10/08/12 0322     Hemolysis Index 1 Updated:10/08/12 0651    GFR [[id number]] Collected:10/08/12 0322     EGFR >60.0 Updated:10/08/12 0651    Vancomycin, trough [[id number]] Collected:10/08/12 0322    Specimen Information:Blood Updated:10/08/12 0426     Vancomycin Trough 19.5 ug/mL      Vancomycin Time of Last Dose unknown      Vancomycin Date of Last Dose 10/07/2012     Narrative:    Please draw vanco trough 30" prior to 9/25, 4 am dose.    CBC and differential [[id number]] Collected:10/08/12 0322    Specimen Information:Blood / Blood Updated:10/08/12 0322    Blood Culture #2 [[id number]] Collected:10/04/12 1840    Specimen Information:Blood / Blood Updated:10/08/12 0025    Narrative:    ORDER#: [id number]                                    ORDERED BY: Silvio Pate  SOURCE: Blood peripheral                             COLLECTED:  10/04/12 18:40  ANTIBIOTICS AT COLL.:  RECEIVED :  10/04/12 23:46  Culture Blood                              PRELIM      10/08/12 00:21  10/06/12   No Growth after 1 day/s of incubation.  10/07/12   No Growth after 2 day/s of incubation.  10/08/12   No Growth after 3 day/s of incubation.      Blood Culture #1 [[id number]] Collected:10/04/12 1840    Specimen Information:Blood / Blood Updated:10/08/12 0025    Narrative:    ORDER#: [id number]                                    ORDERED BY: Silvio Pate  SOURCE: Blood peripheral                             COLLECTED:  10/04/12 18:40  ANTIBIOTICS AT COLL.:                                RECEIVED :  10/04/12 23:46  Culture Blood                              PRELIM      10/08/12 00:21  10/06/12   No Growth after 1 day/s of  incubation.  10/07/12   No Growth after 2 day/s of incubation.  10/08/12   No Growth after 3 day/s of incubation.      Culture Blood, Aerobic [[id number]] Collected:10/05/12 1626    Specimen Information:Blood / Blood, Intravenous Line Updated:10/07/12 2015    Narrative:    ORDER#: [id number]                                    ORDERED BY: CHOUDHARY, IMTI  SOURCE: Blood, Intravenous Line groshong             COLLECTED:  10/05/12 16:26  ANTIBIOTICS AT COLL.:                                RECEIVED :  10/05/12 20:12  Culture Blood                              PRELIM      10/07/12 20:15  10/06/12   No Growth after 1 day/s of incubation.  10/07/12   No Growth after 2 day/s of incubation.      Culture Blood, Aerobic [[id number]] Collected:10/05/12 1626    Specimen Information:Blood / Blood, Venipuncture Updated:10/07/12 2015    Narrative:    ORDER#: [id number]                                    ORDERED BY: CHOUDHARY, IMTI  SOURCE: Blood, Venipuncture peripheral               COLLECTED:  10/05/12 16:26  ANTIBIOTICS AT COLL.:  RECEIVED :  10/05/12 20:12  Culture Blood                              PRELIM      10/07/12 20:15  10/06/12   No Growth after 1 day/s of incubation.  10/07/12   No Growth after 2 day/s of incubation.            IMAGING DATA:  Xr Chest 2 Views    10/04/2012  INDICATION: fever  TECHNIQUE:  PA and lateral radiographs of the chest were obtained.    FINDINGS:  The heart size is within normal limits.  The lungs are clear of acute disease.  There is no pneumothorax or pleural effusion. There is a central venous catheter in the SVC.       10/04/2012    No evidence of acute process.  Aquilla Hacker, MD  10/04/2012 7:21 PM                       Larene Pickett, MD  [facility]    [phone]

## 2012-10-09 LAB — CBC
Hematocrit: 25.4 % — ABNORMAL LOW (ref 37.0–47.0)
Hgb: 8.5 g/dL — ABNORMAL LOW (ref 12.0–16.0)
MCH: 32 pg (ref 28.0–32.0)
MCHC: 33.5 g/dL (ref 32.0–36.0)
MCV: 95.5 fL (ref 80.0–100.0)
MPV: 10.5 fL (ref 9.4–12.3)
Nucleated RBC: 0 (ref 0–1)
Platelets: 54 — ABNORMAL LOW (ref 140–400)
RBC: 2.66 — ABNORMAL LOW (ref 4.20–5.40)
RDW: 13 % (ref 12–15)
WBC: 27.44 — ABNORMAL HIGH (ref 3.50–10.80)

## 2012-10-09 LAB — BASIC METABOLIC PANEL
Anion Gap: 11 (ref 5.0–15.0)
BUN: 10 mg/dL (ref 7.0–19.0)
CO2: 26 (ref 22–29)
Calcium: 9.7 mg/dL (ref 8.5–10.5)
Chloride: 106 (ref 98–107)
Creatinine: 0.7 mg/dL (ref 0.6–1.0)
Glucose: 130 mg/dL — ABNORMAL HIGH (ref 70–100)
Potassium: 3.8 (ref 3.5–5.1)
Sodium: 143 (ref 136–145)

## 2012-10-09 LAB — HEMOLYSIS INDEX: Hemolysis Index: 3 (ref 0–18)

## 2012-10-09 LAB — MAGNESIUM: Magnesium: 2 mg/dL (ref 1.6–2.6)

## 2012-10-09 LAB — PHOSPHORUS: Phosphorus: 3 mg/dL (ref 2.3–4.7)

## 2012-10-09 LAB — GFR: EGFR: >60

## 2012-10-09 MED ORDER — FAMOTIDINE 20 MG PO TABS
20.0000 mg | ORAL_TABLET | Freq: Two times a day (BID) | ORAL | Status: AC
Start: 2012-10-09 — End: ?

## 2012-10-09 NOTE — Discharge Summary (Signed)
DISCHARGE SUMMARY    Date Time: 10/09/2012 12:57 PM  Patient Name: [name]  Attending Physician: Christophe Louis, DO    Date of Admission:   10/04/2012    Date of Discharge:   10/09/2012    Reason for Admission:   Neutropenic fever [288.00, 780.61]  Febrile neutropenia      Discharge Dx:   Neutropenic fever - resolved- no more neutropenic   Acute myeloid leukemia - Day 18 post first cycle of high dose Ara C consolidation.   Hx of asthma   Pancytopenia - improved   Thrombocytopenia  Headache - resolved - CT head negative    Consultations:   [place] Crane, MD  Dunning, Teena Irani, MD      Discharge Medications:        Medication List       As of 10/09/2012 12:57 PM      START taking these medications           famotidine 20 MG tablet    Commonly known as: PEPCID    Take 1 tablet (20 mg total) by mouth 2 (two) times daily.         CONTINUE taking these medications           allopurinol 100 MG tablet    Commonly known as: ZYLOPRIM        dexamethasone 0.1 % ophthalmic suspension    Commonly known as: MAXIDEX        fluconazole 100 MG tablet    Commonly known as: DIFLUCAN        levofloxacin 500 MG tablet    Commonly known as: LEVAQUIN        prochlorperazine 10 MG tablet    Commonly known as: COMPAZINE        pyridoxine 100 MG tablet    Commonly known as: B-6        valacyclovir 1000 MG tablet    Commonly known as: VALTREX               Where to get your medications     These are the prescriptions that you need to pick up.    You may get these medications from any pharmacy.           famotidine 20 MG tablet                   Hospital Course:   HOSPITAL COURSE:  Please see the dictated H and P and consult for details.  Briefly, patient  is a 57 year old female with history of acute myeloid leukemia who  presented with fever and neutropenia.  The patient was admitted for  neutropenic fever.  Infectious diseases consulted and started on  antibiotics.  Infectious disease followed the patient during the  hospital  course.  The patient's fever resolved.  Cultures remained negative.  The  patient is cleared from the infectious disease standpoint for discharge.   The patient during the hospital course had a headache.  CT of the head was  done, which was negative.  The patient is cleared from the  hematology-oncology standpoint for discharge.  The patient has a  followup appointment on September 29.  Other issues remained stable.   Electrolytes were repleted.      Laboratory Data     CBC    Lab 10/09/12 1114 10/08/12 0322 10/07/12 0543 10/06/12 0509   WBC 27.44* 17.65* 4.72 --   HGB 8.5* 8.0* 8.1* --   HCT  25.4* 24.1* 24.6* --   PLT 54* 29* 23* --   MCV 95.5 94.5 93.9 --   NEUTRO -- 60 44 12       CMP    Lab 10/09/12 1114 10/08/12 0322 10/07/12 0543 10/06/12 0509   NA 143 143 141 --   K 3.8 3.8 3.8 --   CL 106 109* 112* --   CO2 26 25 22  --   BUN 10.0 9.0 5.0* --   CREAT 0.7 0.6 0.6 --   GLU 130* 67* 96 --   CA 9.7 9.7 9.2 --   MG 2.0 1.8 1.6 --   PHOS 3.0 2.7 1.6* --   PROT -- 5.1* 5.3* 4.8*   ALB -- 2.8* 2.7* 2.5*   AST -- 22 17 14    ALT -- 28 26 20    ALKPHOS -- 112 72 71   BILITOTAL -- 0.1* 0.2 0.2     Culture:   Microbiology Results     Procedure Component Value Units Date/Time    Blood Culture #1 [[id number]] Collected:10/04/12 1840    Specimen Information:Blood / Blood Updated:10/09/12 0025    Narrative:    ORDER#: [id number]                                    ORDERED BY: Silvio Pate  SOURCE: Blood peripheral                             COLLECTED:  10/04/12 18:40  ANTIBIOTICS AT COLL.:                                RECEIVED :  10/04/12 23:46  Culture Blood                              PRELIM      10/09/12 00:21  10/06/12   No Growth after 1 day/s of incubation.  10/07/12   No Growth after 2 day/s of incubation.  10/08/12   No Growth after 3 day/s of incubation.  10/09/12   No Growth after 4 day/s of incubation.      Blood Culture #2 [[id number]] Collected:10/04/12 1840    Specimen Information:Blood / Blood  Updated:10/09/12 0025    Narrative:    ORDER#: [id number]                                    ORDERED BY: Silvio Pate  SOURCE: Blood peripheral                             COLLECTED:  10/04/12 18:40  ANTIBIOTICS AT COLL.:                                RECEIVED :  10/04/12 23:46  Culture Blood                              PRELIM      10/09/12 00:21  10/06/12   No Growth after 1 day/s of incubation.  10/07/12  No Growth after 2 day/s of incubation.  10/08/12   No Growth after 3 day/s of incubation.  10/09/12   No Growth after 4 day/s of incubation.      Culture Blood, Aerobic [[id number]] Collected:10/05/12 1626    Specimen Information:Blood / Blood, Venipuncture Updated:10/08/12 2015    Narrative:    ORDER#: [id number]                                    ORDERED BY: CHOUDHARY, IMTI  SOURCE: Blood, Venipuncture peripheral               COLLECTED:  10/05/12 16:26  ANTIBIOTICS AT COLL.:                                RECEIVED :  10/05/12 20:12  Culture Blood                              PRELIM      10/08/12 20:15  10/06/12   No Growth after 1 day/s of incubation.  10/07/12   No Growth after 2 day/s of incubation.  10/08/12   No Growth after 3 day/s of incubation.      Culture Blood, Aerobic [[id number]] Collected:10/05/12 1626    Specimen Information:Blood / Blood, Intravenous Line Updated:10/08/12 2015    Narrative:    ORDER#: [id number]                                    ORDERED BY: CHOUDHARY, IMTI  SOURCE: Blood, Intravenous Line groshong             COLLECTED:  10/05/12 16:26  ANTIBIOTICS AT COLL.:                                RECEIVED :  10/05/12 20:12  Culture Blood                              PRELIM      10/08/12 20:15  10/06/12   No Growth after 1 day/s of incubation.  10/07/12   No Growth after 2 day/s of incubation.  10/08/12   No Growth after 3 day/s of incubation.      Urine culture [[id number]] Collected:10/04/12 1916    Specimen Information:Urine / Urine, Clean Catch Updated:10/05/12 2157    Narrative:     ORDER#: [id number]                                    ORDERED BY: Silvio Pate  SOURCE: Urine, Clean Catch                           COLLECTED:  10/04/12 19:16  ANTIBIOTICS AT COLL.:                                RECEIVED :  10/04/12 23:13  Culture Urine  FINAL       10/05/12 21:57  10/05/12   1,000 - 9,000 CFU/ML of normal urogenital or skin microbiota, no further             work               All radiology result for current encounter  Xr Chest 2 Views    10/04/2012    No evidence of acute process.  Aquilla Hacker, MD  10/04/2012 7:21 PM     Ct Head Wo Contrast    10/08/2012   No hemorrhage or acute process.      Heron Nay, MD  10/08/2012 11:14 AM         Physical Exam:   BP 97/52  Pulse 82  Temp 98.2 F (36.8 C) (Oral)  Resp 16  Ht 1.651 m (5\' 5" )  Wt 49.896 kg (110 lb)  BMI 18.31 kg/m2  SpO2 94%    General appearance - alert, and in no distress  HEENT: Normocephalic,atraumatic, pupil equal  Neck - supple  Chest - clear to auscultation  Heart - normal rate and regular rhythm  Abdomen - bowel sounds normal, soft, non distended  Extremities - no pedal edema  Neurological - Alert      Discharge condition:   Stable     Discharge  Diet :   Cardiac     Discharge Instructions:   Follow up:     Edwyna Shell, MD  [address]  [place] [address]  [phone]    Schedule an appointment as soon as possible for a visit in 2 weeks      Dunning, Teena Irani, MD  [address]  [address]  [place] [address]  [phone]    On 10/12/2012  Oncology        Christophe Louis, DO  10/09/2012  12:57 PM  Time spent 35 minutes

## 2012-10-09 NOTE — Progress Notes (Signed)
HEMATOLOGY ONCOLOGY PROGRESS NOTE    Date Time: 10/09/2012 9:23 AM  Patient Name: [name],[name]      IMPRESSION:     Patient Active Problem List   Diagnosis   . Chondromalacia of patella   . Neutropenic fever   . Febrile neutropenia   . Hypotension   . Thrombocytopenia   . Acute myeloid leukemia   . Pancytopenia         PLAN:  Would D/C on pepcid alone.  No contraindications to activity.  Patient needs to return for follow up early next week.             HISTORY: Day 18 post first cycle of high dose Ara C consolidation.  Marrow recovery occurring quickly.  CT of head was negative.  H/A has resolved.  Patient feels well.  ROS negative.       PHYSICAL EXAM:     Filed Vitals:    10/09/12 0818   BP: 97/52   Pulse: 82   Temp: 98.2 F (36.8 C)   Resp: 16   SpO2: 94%       Intake and Output Summary (Last 24 hours) at Date Time    Intake/Output Summary (Last 24 hours) at 10/09/12 3474  Last data filed at 10/08/12 1830   Gross per 24 hour   Intake    400 ml   Output      4 ml   Net    396 ml       Appearance: in no acute distress  HEENT: pharynx clear  Lungs: clear to ausculation  Heart: regular rate and rhythm, normal heart sounds  Abdomen: flat and soft, active bowel sounds  Extremities: no edema  Neuro: alert, appropriate  MEDS:   Scheduled Meds:  Current Facility-Administered Medications   Medication Dose Route Frequency   . allopurinol  100 mg Oral Daily   . dexamethasone  1 drop Both Eyes BID   . famotidine  20 mg Intravenous Q12H [facility]   . lactobacillus/streoptococcus  1 capsule Oral Daily   . levofloxacin  500 mg Intravenous Daily   . vitamin B-6  50 mg Oral Daily   . [DISCONTINUED] acyclovir  400 mg Oral BID   . [DISCONTINUED] fluconazole  100 mg Oral Daily   . [DISCONTINUED] meropenem  500 mg Intravenous Q6H   . [DISCONTINUED] tbo-filgrastim  300 mcg Subcutaneous Daily at 1800   . [DISCONTINUED] vancomycin  1,250 mg Intravenous Q8H     Continuous Infusions:   PRN Meds:.acetaminophen, acetaminophen, docusate  sodium, ondansetron, ondansetron, polyethylene glycol, zolpidem    LABS:     Results     Procedure Component Value Units Date/Time    Blood Culture #2 [[id number]] Collected:10/04/12 1840    Specimen Information:Blood / Blood Updated:10/09/12 0025    Narrative:    ORDER#: [id number]                                    ORDERED BY: Silvio Pate  SOURCE: Blood peripheral                             COLLECTED:  10/04/12 18:40  ANTIBIOTICS AT COLL.:                                RECEIVED :  10/04/12 23:46  Culture Blood                              PRELIM      10/09/12 00:21  10/06/12   No Growth after 1 day/s of incubation.  10/07/12   No Growth after 2 day/s of incubation.  10/08/12   No Growth after 3 day/s of incubation.  10/09/12   No Growth after 4 day/s of incubation.      Blood Culture #1 [[id number]] Collected:10/04/12 1840    Specimen Information:Blood / Blood Updated:10/09/12 0025    Narrative:    ORDER#: [id number]                                    ORDERED BY: Silvio Pate  SOURCE: Blood peripheral                             COLLECTED:  10/04/12 18:40  ANTIBIOTICS AT COLL.:                                RECEIVED :  10/04/12 23:46  Culture Blood                              PRELIM      10/09/12 00:21  10/06/12   No Growth after 1 day/s of incubation.  10/07/12   No Growth after 2 day/s of incubation.  10/08/12   No Growth after 3 day/s of incubation.  10/09/12   No Growth after 4 day/s of incubation.      Culture Blood, Aerobic [[id number]] Collected:10/05/12 1626    Specimen Information:Blood / Blood, Venipuncture Updated:10/08/12 2015    Narrative:    ORDER#: [id number]                                    ORDERED BY: CHOUDHARY, IMTI  SOURCE: Blood, Venipuncture peripheral               COLLECTED:  10/05/12 16:26  ANTIBIOTICS AT COLL.:                                RECEIVED :  10/05/12 20:12  Culture Blood                              PRELIM      10/08/12 20:15  10/06/12   No Growth after 1 day/s of  incubation.  10/07/12   No Growth after 2 day/s of incubation.  10/08/12   No Growth after 3 day/s of incubation.      Culture Blood, Aerobic [[id number]] Collected:10/05/12 1626    Specimen Information:Blood / Blood, Intravenous Line Updated:10/08/12 2015    Narrative:    ORDER#: [id number]                                    ORDERED BY: CHOUDHARY, IMTI  SOURCE: Blood, Intravenous Line groshong  COLLECTED:  10/05/12 16:26  ANTIBIOTICS AT COLL.:                                RECEIVED :  10/05/12 20:12  Culture Blood                              PRELIM      10/08/12 20:15  10/06/12   No Growth after 1 day/s of incubation.  10/07/12   No Growth after 2 day/s of incubation.  10/08/12   No Growth after 3 day/s of incubation.            IMAGING DATA:  Xr Chest 2 Views    10/04/2012  INDICATION: fever  TECHNIQUE:  PA and lateral radiographs of the chest were obtained.    FINDINGS:  The heart size is within normal limits.  The lungs are clear of acute disease.  There is no pneumothorax or pleural effusion. There is a central venous catheter in the SVC.       10/04/2012    No evidence of acute process.  Aquilla Hacker, MD  10/04/2012 7:21 PM     Ct Head Wo Contrast    10/08/2012  CT HEAD WO CONTRAST CLINICAL INDICATION: AML with thrombocytopenia and severe headache rule out CNS bleed  COMPARISON: None available.  TECHNIQUE: Imaging was performed from the skull base to the vertex without contrast administration.  FINDINGS: The ventricles are normal in size and configuration for the patient's age, and no midline shift is present. No significant focal area of abnormal attenuation is seen in the brain parenchyma, and no extra-axial collections are present. There is no hemorrhage, mass effect, or evidence of acute infarction.           10/08/2012   No hemorrhage or acute process.      Heron Nay, MD  10/08/2012 11:14 AM                       Larene Pickett, MD  [facility]    [phone]

## 2012-10-09 NOTE — Plan of Care (Signed)
PT IS ALERT AND ORIENTED X 3. DENIES PAIN. READY TO BE D/C TO HOME PER ORDER.

## 2012-10-09 NOTE — Discharge Instructions (Signed)
Neutropenia  White blood cells (WBCs) help protect the body from infection. Neutrophils are a type of white blood cell. Their main job is to help the body fight bacterial and fungal infections. Neutropenia occurs when there are fewer neutrophils in the blood than normal. It can range from mild to severe. This depends on the number of neutrophils in the blood. Severe neutropenia puts a person at higher risk for having more infections. Bacterial and fungal infections are most common. Your doctor can tell you more about your condition and whether it needs to be treated.    Types and Causes of Neutropenia  There are two main types of neutropenia: congenital and acquired. Each type has many causes.   Congenital neutropenia. These are the types that are present at birth. They are caused by certain rare genetic conditions, such as Kostmann's syndrome.   Acquired neutropenia. This type is not present at birth. Causes include:   Certain medications, such as antibiotics and chemotherapy drugs   Certain autoimmune conditions   Certain viral, bacterial, or parasitic infections   Too little folate or vitamin B12 in the diet   Other causes  Diagnosing Neutropenia  Your doctor may check for neutropenia if you have frequent infections. Your doctor may also check for neutropenia if you're having certain treatments, such as chemotherapy, which is known to cause a lower neutrophil count. Tests will be done to confirm the problem. These may include:   A complete blood cell count (CBC). This test measures the amounts of the different types of cells in your blood. This includes the WBCs. The WBC count can be broken down further to find the number of neutrophils and immature neutrophils (bands) in your blood. This is called an absolute neutrophil count (ANC).   A blood smear. This test checks for the different types of blood cells in your blood and how they appear. A sample of your blood is spread on a glass slide and viewed  under a microscope. A stain is used so the blood cells can be seen.   A bone marrow aspiration and biopsy. This test checks for problems with how your bone marrow makes blood cells. A needle is used to remove a sample of the bone marrow in your hip bone. The sample is then sent to a lab to be tested for problems.  Treating Neutropenia   If there is a clear cause of neutropenia, it is addressed. For instance, if a medication is the cause, it may be stopped or changed.   For mild cases, often no treatment is needed. Your doctor monitors your symptoms to see if they improve. Blood tests are also done to see if your neutrophil count returns to normal on its own.   For moderate to severe cases, treatment is likely needed. This may include:   G-CSF (granulocyte-colony stimulating factor). This is a special type of protein. It helps promote the growth and activity of neutrophils. G-CSF is given by injection.   Bone marrow transplant.This treatment replaces diseased bone marrow cells with healthy cells from a matched donor. This treatment is only done in specific severe cases.  Long-Term Outcome of Neutropenia  The outcome of neutropenia varies for each person. For some people, neutropenia may resolve after a few weeks or months. For other people, neutropenia may be long-lasting. In these cases, it may require ongoing care and treatment. Your doctor will talk to you more about what to expect from your condition.  When to Call the Doctor  Call your doctor right away if you have any of the following:   Fever of 100.60F or higher (call 911--this is especially important if you have severe neutropenia, which puts you at higher risk for life-threatening infection)    Cold sweat or chills   Chest pain or trouble breathing   Sore throat   Extreme tiredness or fatigue   Nausea and vomiting   Redness, warmth, or drainage from any open cuts or wounds   Pain or burning with urination; frequent urination   Pain, burning,  or bleeding in the rectum   Severe constipation or diarrhea   Bloody stool or urine   Preventing Infections: What You Can Do  With neutropenia, you need to take extra care to protect yourself from infection. Be sure to:   Wash your hands often, especially before eating and after using the bathroom. Use warm water and soap. Or use a hand gel that contains at least 60 percent alcohol.   Avoid close contact with others who may be ill.   Clean items you use often with disinfectant wipes. This includes phones and computer keyboards.   Avoid touching your eyes, nose, and mouth, especially if your hands are not clean.   Practice good oral hygiene. Use a soft toothbrush. Also, brush and floss your teeth gently.   Always wipe from front to back after a bowel movement.   Keep cuts and scrapes clean and covered until they heal.   Avoid sharing items such as towels, toothbrushes, razors, clothing, and sports equipment.   Store and handle foods safely to prevent food-borne illness.   Ask your doctor if you need to take antibiotics before and after having any dental or medical procedures.   Ask your doctor if you need to wear a special mask near construction sites or farm areas.   [address], [place], [place] [address]. All rights reserved. This information is not intended as a substitute for professional medical care. Always follow your healthcare professional's instructions.      Fever Control (Adult)  A fever is a natural reaction of the body to an illness. In most cases, the temperature itself is not harmful. It actually helps the body fight infections. A fever does not need to be treated unless you feel very uncomfortable.  Home Care   If you feel warm, check your temperature.   If you feel very uncomfortable and your temperature is at or higher than 100.60F (38C) oral, you may take acetaminophen (Tylenol) every 4 to 6 hours. If you can't take or keep down oral medicine, ask  your pharmacist for Tylenol suppositories, which you can get without a prescription. If the fever does not respond to acetaminophen within 1 hour, take ibuprofen (Advil or Motrin). If this works, keep taking the ibuprofen every 6 to 8 hours. Note: If you have chronic liver or kidney disease or ever had a stomach ulcer or GI bleeding, talk with your doctor before using these medications.   If either medication alone does not keep the fever down, you may alternate the two medicines every 3 to 4 hours, only if your healthcare provider has instructed you to do so. For example, take Motrin then wait 3 hours, take Tylenol then wait 3 hours, take Motrin, and so on. Follow your healthcare provider's instructions exactly.   Clothing: Keep clothing light because excess body heat is lost through the skin. The fever will go up if you wear extra layers or wrap  in blankets.   Fluids: Fever causes the body to lose water through evaporation. Drink plenty of fluids such as water, juice, clear sodas, ginger ale, or lemonade.   Do not use aspirin in anyone under 7 years of age who is ill with a fever. It can cause severe liver damage.  Follow Up  with your doctor or as advised by our staff if you do not get better after 48 hours.  Get Prompt Medical Attention  if any of the following occur:   Fever does not get better after taking fever medication   Fast or difficult breathing   Earache, sinus pain, stiff or painful neck, headache, repeated diarrhea or vomiting   You feel unusually irritable, drowsy, or confused   A rash appears   You feel weak or dizzy, or that you might faint   [address], [address], [place], [place] [address]. All rights reserved. This information is not intended as a substitute for professional medical care. Always follow your healthcare professional's instructions.

## 2012-10-09 NOTE — Discharge Summary -  Nursing (Signed)
PT IS ALERT AND ORIENTED X 3. DENIES HEADACHE. D/C INSTRUCTION EXPLAINED. PT IS D/C TO HOME PER ORDER WITH [facility],AMBULATE BY SELF.

## 2012-10-09 NOTE — Plan of Care (Signed)
Problem: Hemodynamic Status: Immune Suppressed-Hematologic  Goal: Stable vital signs and fluid balance  Outcome: Progressing  PT VSS BLD CX NEGATIVE. ALL ABX D/C'd EXCEPT LEVAQUIN.  PT C/O HA ON PREVIOUS SHIFT.  CT HEAD DONE, UNREMARKABLE.  NO C/O HA DURING SHIFT.  9/25 H/H AND WBCs UP.  REPEAT LAB WORK DONE.    Problem: Safety  Goal: Patient will be free from injury during hospitalization  Outcome: Progressing  PT REMAINS FREE FROM FALLS AND INJURY.  PT AMBULATORY IN ROOM AS NEEDED.  PT C/O FEELING WEAK, BUT BETTER.  PT ENCOURAGED TO CALL FOR ASSIST WITH AMBULATION AS NEEDED.  PT USING [facility] AT NIGHT AND ABLE TO TRANSFER TO [facility] SAFELY.  CALL BELL WITHIN REACH.    Problem: Pain  Goal: Patient's pain/discomfort is manageable  Outcome: Progressing  PT VOICED NO C/O PAIN ON ROUNDS.

## 2012-10-09 NOTE — Progress Notes (Signed)
Infectious Diseases & Tropical Medicine  Progress Note    10/09/2012   [name] SAY:[id number],FTD:[id number] is a 57 y.o. 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 Richardson Chiquito    ROS:     General:  Fever improved, no chills, no rigor, awake, alert, ambulating in the room. Feeling better  HEENT: no neck pain, no throat pain, no sinus congestion,  Headache improved  Endocrine: no fatigue  Respiratory: no cough, shortness of breath, or wheezing   Cardiovascular: no chest pain   Gastrointestinal: no abdominal pain,no N/V/D  Genito-Urinary: no dysuria, trouble voiding, or hematuria   Musculoskeletal: no edema  Neurological: c/o generalized weakness   Dermatological: no rash, no ulcer    Physical Examination:     Blood pressure 97/52, pulse 82, temperature 98.2 F (36.8 C), temperature source Oral, resp. rate 16, height 1.651 m (5\' 5" ), weight 49.896 kg (110 lb), SpO2 94.00%.     General Appearance: Comfortable, and in no acute distress.  Awake and alert. Ambulating   HEENT: Pupils are equal, round, and reactive to light.    Lungs:  Scattered rhonchi   Heart: RRR   Chest: Symmetric chest wall expansion.    Abdomen: soft ,non tender,no hepatosplenomegaly   Neurological: No focal deficit   Extremities: No edema    Laboratory And Diagnostic Studies:     Recent Labs   [facility] 10/08/12 0322 10/07/12 0543    WBC 17.65* 4.72    HGB 8.0* 8.1*    HCT 24.1* 24.6*    PLT 29* 23*     Recent Labs   [facility] 10/08/12 0322 10/07/12 0543    NA 143 141    K 3.8 3.8    CL 109* 112*    CO2 25 22    BUN 9.0 5.0*    CREAT 0.6 0.6    GLU 67* 96    CA 9.7 9.2     Recent Labs   [facility] 10/08/12 0322  10/07/12 0543    AST 22 17    ALT 28 26    ALKPHOS 112 72    PROT 5.1* 5.3*    ALB 2.8* 2.7*       Current Meds:      Scheduled Meds: PRN Meds:           allopurinol 100 mg Oral Daily   dexamethasone 1 drop Both Eyes BID   famotidine 20 mg Intravenous Q12H [facility]   lactobacillus/streoptococcus 1 capsule Oral Daily   levofloxacin 500 mg Intravenous Daily   vitamin B-6 50 mg Oral Daily   [DISCONTINUED] acyclovir 400 mg Oral BID   [DISCONTINUED] fluconazole 100 mg Oral Daily   [DISCONTINUED] meropenem 500 mg Intravenous Q6H   [DISCONTINUED] tbo-filgrastim 300 mcg Subcutaneous Daily at 1800   [DISCONTINUED] vancomycin 1,250 mg Intravenous Q8H       Continuous Infusions:         acetaminophen 650 mg Q6H PRN   acetaminophen 650 mg Q6H PRN   docusate sodium 100 mg Q12H PRN  ondansetron 4 mg Q4H PRN   ondansetron 4 mg Q6H PRN   polyethylene glycol 17 g QD PRN   zolpidem 5 mg QHS PRN         [name], M.D.  10/09/2012  8:44 AM

## 2012-10-16 ENCOUNTER — Ambulatory Visit
Admission: RE | Admit: 2012-10-16 | Discharge: 2012-10-16 | Disposition: A | Discharge status: Home / Self Care | Payer: 59 | Source: Ambulatory Visit | Attending: Hematology & Oncology | Admitting: Hematology & Oncology

## 2012-10-16 ENCOUNTER — Other Ambulatory Visit: Payer: Self-pay | Admitting: Hematology & Oncology

## 2012-10-16 DIAGNOSIS — R0602 Shortness of breath: Secondary | ICD-10-CM | POA: Insufficient documentation

## 2012-11-04 ENCOUNTER — Ambulatory Visit
Admission: RE | Admit: 2012-11-04 | Discharge: 2012-11-04 | Disposition: A | Discharge status: Home / Self Care | Payer: 59 | Source: Ambulatory Visit | Attending: Hematology & Oncology | Admitting: Hematology & Oncology

## 2012-11-04 DIAGNOSIS — D649 Anemia, unspecified: Secondary | ICD-10-CM | POA: Insufficient documentation

## 2012-11-04 DIAGNOSIS — R894 Abnormal immunological findings in specimens from other organs, systems and tissues: Secondary | ICD-10-CM | POA: Insufficient documentation

## 2012-11-04 LAB — TYPE AND SCREEN
AB Screen Gel: NEGATIVE
ABO Rh: O POS

## 2012-11-06 ENCOUNTER — Ambulatory Visit
Admission: RE | Admit: 2012-11-06 | Discharge: 2012-11-06 | Disposition: A | Discharge status: Home / Self Care | Payer: 59 | Source: Ambulatory Visit | Attending: Hematology & Oncology | Admitting: Hematology & Oncology

## 2012-11-06 VITALS — BP 92/41 | HR 78 | Temp 98.2°F | Ht 65.5 in | Wt 114.0 lb

## 2012-11-06 DIAGNOSIS — C9100 Acute lymphoblastic leukemia not having achieved remission: Secondary | ICD-10-CM | POA: Insufficient documentation

## 2012-11-06 DIAGNOSIS — D702 Other drug-induced agranulocytosis: Secondary | ICD-10-CM | POA: Insufficient documentation

## 2012-11-06 DIAGNOSIS — T451X5A Adverse effect of antineoplastic and immunosuppressive drugs, initial encounter: Secondary | ICD-10-CM | POA: Insufficient documentation

## 2012-11-06 LAB — PREPARE RBC

## 2012-11-06 LAB — PREPARE PLATELETS
Platelet Product-: TRANSFUSED
Platelet Product-: TRANSFUSED

## 2012-11-06 MED ORDER — ACETAMINOPHEN 325 MG PO TABS
ORAL_TABLET | ORAL | Status: AC
Start: 2012-11-06 — End: 2012-11-06
  Administered 2012-11-06: 650 mg
  Filled 2012-11-06: qty 2

## 2012-11-06 MED ORDER — DIPHENHYDRAMINE HCL 25 MG PO CAPS
ORAL_CAPSULE | ORAL | Status: AC
Start: 2012-11-06 — End: 2012-11-06
  Administered 2012-11-06: 25 mg
  Filled 2012-11-06: qty 1

## 2012-11-06 NOTE — Progress Notes (Signed)
Pt arrived ambulatory accompanied by her friend for platelet transfusion in stable condition. Double lumen groshong flushed with normal saline with excellent blood returned. Platelet transfused and vital signs monitored closely. Completed transfusion with out any problem. AVS given and encouraged to f/u and call physician with further question. Pt verbalizes understanding. Discharged home in stable  condition.

## 2012-11-06 NOTE — Discharge Instructions (Signed)
Blood and Blood Product Transfusions for Cancer  A blood transfusion is when blood or parts of the blood are given to a person through an IV line placed in a vein. The blood and blood parts used for transfusion are called blood products. The blood usually comes from another person (donor). This sheet tells you more about how blood and blood products may be used to help treat cancer.  Understanding Blood and Blood Parts    Blood is a fluid that flows throughout the body. It is made up of different parts that perform specific roles.   Red blood cells(RBCs) carry oxygen throughout the body.   White blood cells (WBCs) are part of the body's immune system. Their main job is to help fight infections and diseases.   Platelets are fragments of blood cells that help with clotting. When you have a cut or bruise, platelets come together to form a clot or "plug." This helps to control bleeding, so you don't lose too much blood.   Plasma is the liquid portion of blood. It carries the different types of blood cells to all the parts of the body. Plasma also carries proteins called clotting factors. Clotting factors help platelets with the clotting process.  Blood is divided into four types: A, B, AB, and O. Blood also has Rh types: positive (+) and negative (-). Any blood products you receive during a transfusion must be compatible with your blood type.  Why a Transfusion May Be Done  Cancer can cause various problems that may require treatment with transfusions. For example:   Cancer can affect the bone marrow. This is the soft, spongy part inside bones where most of the body's blood cells are made. When the bone marrow is damaged or destroyed, the body cannot make enough blood cells. Without enough blood cells, the body cannot maintain its normal functions.   Cancer can cause anemia. This condition occurs when there are too few red blood cells in the body. Without enough red blood cells, the body's tissues and organs  do not receive enough oxygen. Anemia can make you feel tired or short of breath.   Occasionally, certain cancers can cause internal bleeding. This can lead to blood loss that can threaten your health.  Certain treatments for cancer can lower the number of healthy blood cells in the body and require the use of transfusions. These treatments include:   Chemotherapy (chemo) uses strong medications to help kill cancer cells. However, these medications can also damage healthy cells, including cells in the bone marrow. This can lower your blood cell counts.   Radiation uses high-energy x-rays to help kill cancer cells. As with chemo, this treatment can also damage healthy cells in the bone marrow. This can lower your blood cell counts.   Surgery may be needed to remove a tumor (group of cancerous cells) in the body. The surgery can cause blood loss that requires the use of transfusions.  Types of Transfusions  Depending on your needs, your doctor may recommend one or more of the blood products listed below as part of your treatment plan. Your doctor will explain to you how the transfusions will be given and how often they may be needed. Before receiving any blood products, you will need to have some blood drawn so your blood type can be determined. You will also need to sign a consent form acknowledging the potential risks of receiving a transfusion.   RBC transfusions. These are most often used to treat   severe anemia or blood loss. RBCs must be "typed" to match your blood type. Except in severe blood loss, cancer patients receive "packed" red blood cells without plasma. Each bag (called a "unit") takes about 2 hours to infuse. During that time, nursing staff will be monitoring your temperature, pulse and blood pressure.   Platelet transfusions. These are used if your platelet count is too low, which puts you at high risk of bleeding. Although platelets should be "typed" to match your blood type, it is not required.  Platelets can be obtained in different ways:   from one donor ("apheresis product")   combined from several bags of whole blood ("pooled product")   from a community donor who is specially matched ("matched product")  A bag of platelets takes about 1 hour or less to infuse. As with RBCs, nurses will monitor your temperature, pulse, and blood pressure.   Plasma (FFP) transfusions. These may be used to supply the blood with more clotting factors to help stop excess bleeding. FFP must be "typed" to match your blood type. One unit of plasma is taken from a unit of whole blood and is then frozen at the blood bank. Plasma is thawed when it is needed. FFP usually takes 1 to 2 hours to infuse.   WBC transfusions. Due to the severe risks involved, these transfusions are rarely used. If there is a problem with the WBCs, your doctor may recommend other treatments to help promote the growth of new WBCs.  Risks and Possible Complications of Blood and Blood Product Transfusions Include:   Fever and chills   Allergic reaction (itchy skin or rash; redness, or flushing of the face)   Chest pain   Low blood pressure  In the rare event of receiving the wrong ABO blood type:   Back pain   Fast heart rate   Low blood pressure   Nausea  Although extremely rare, some diseases can be transmitted through blood transfusions. They include:   Hepatitis B   Hepatitis C   HIV   Bacterial infections  Learning More About the Safety of Blood Donation and Blood Transfusions  Strict measures are taken to make sure that donated blood and blood products are safe before they are given to you. To learn more about where donated blood comes from and the process of screening blood, these websites may help:   American [employer], Blood Donation and Transfusion, www.cancer.org/Treatment/TreatmentsandSideEffects/TreatmentTypes/BloodProductDonationandTransfusion/index   [employer], Learn About Blood,  www.redcrossblood.org/learn-about-blood    2000-2014 [employer], [address], [place] [address]. All rights reserved. This information is not intended as a substitute for professional medical care. Always follow your healthcare professional's instructions.

## 2012-11-06 NOTE — Progress Notes (Signed)
Plan of Care/Outcome List    MYELOSUPPRESSION  Diagnosis_____________________________________________________________        Date Initiated  Initials # Discharge Outcome      Date_10/24/2014__    Initials ___VI_____  Patient will remain free of active signs of infection and bleeding during periods of neutropenia and thrombocytopenia, and will tolerate activities of daily living when anemic.    Plan of Care:    Assess blood counts and calculate absolute neutrophil count (ANC) to determine if patient is neutropenic, thrombocytopenic, or anemic (pancytopenic).    Monitor for shaking chills, signs/symptoms of infection/sepsis, changes in respiratory status, and integrity of skin and oral mucosa.    Assess peripheral IV sites and central venous access device sites per standard of care.    Culture blood, urine, stool and any obvious sites of infection as ordered.    Teach patient/significant other measures to decrease risk of infection during hospitalization and upon discharge. To include:    Frequent hand washing and meticulous daily hygiene and oral care.    Monitor temperature for fever.     Daily Showers    Avoid crowds and visitors with possible infections.    Avoid animal excreta.    Avoid raw or undercooked eggs, fish, shellfish,           meat or poultry and eat only well-cleaned raw         Vegetables and fruits.

## 2012-12-09 MED ORDER — TBO-FILGRASTIM 300 MCG/0.5ML SC SOSY
300.0000 ug | PREFILLED_SYRINGE | Freq: Once | SUBCUTANEOUS | Status: AC
Start: 2012-12-10 — End: 2012-12-10
  Administered 2012-12-10: 300 ug via SUBCUTANEOUS
  Filled 2012-12-09: qty 0.5

## 2012-12-09 MED ORDER — TBO-FILGRASTIM 300 MCG/0.5ML SC SOSY
300.0000 ug | PREFILLED_SYRINGE | Freq: Once | SUBCUTANEOUS | Status: AC
Start: 2012-12-13 — End: 2012-12-13
  Administered 2012-12-13: 300 ug via SUBCUTANEOUS
  Filled 2012-12-09: qty 0.5

## 2012-12-09 MED ORDER — TBO-FILGRASTIM 300 MCG/0.5ML SC SOSY
300.0000 ug | PREFILLED_SYRINGE | Freq: Once | SUBCUTANEOUS | Status: AC
Start: 2012-12-11 — End: 2012-12-11
  Administered 2012-12-11: 300 ug via SUBCUTANEOUS
  Filled 2012-12-09: qty 0.5

## 2012-12-10 ENCOUNTER — Ambulatory Visit
Admission: RE | Admit: 2012-12-10 | Discharge: 2012-12-10 | Disposition: A | Discharge status: Home / Self Care | Payer: 59 | Source: Ambulatory Visit | Attending: Hematology & Oncology | Admitting: Hematology & Oncology

## 2012-12-10 VITALS — BP 101/64 | HR 79 | Temp 97.6°F | Resp 18

## 2012-12-10 DIAGNOSIS — C9200 Acute myeloblastic leukemia, not having achieved remission: Secondary | ICD-10-CM | POA: Insufficient documentation

## 2012-12-10 NOTE — Plan of Care (Signed)
Patient came to unit [facility] as a outpatient for granix injection.  Vital signs stable and no pain.  Administered granix SQ in abdomen no side effects noted.  Patient discharged ambulatory in stable condition.

## 2012-12-11 ENCOUNTER — Ambulatory Visit
Admission: RE | Admit: 2012-12-11 | Discharge: 2012-12-11 | Disposition: A | Discharge status: Home / Self Care | Payer: 59 | Source: Ambulatory Visit | Attending: Hematology & Oncology | Admitting: Hematology & Oncology

## 2012-12-11 ENCOUNTER — Ambulatory Visit
Admission: RE | Admit: 2012-12-11 | Discharge: 2012-12-11 | Disposition: A | Discharge status: Home / Self Care | Payer: 59 | Source: Ambulatory Visit

## 2012-12-11 VITALS — BP 110/53 | HR 72 | Temp 97.6°F | Resp 16

## 2012-12-11 DIAGNOSIS — C9200 Acute myeloblastic leukemia, not having achieved remission: Secondary | ICD-10-CM | POA: Insufficient documentation

## 2012-12-11 LAB — MAN DIFF ONLY
Band Neutrophils Absolute: 0 (ref 0.00–1.00)
Band Neutrophils: 0 %
Basophils Absolute Manual: 0 (ref 0.00–0.20)
Basophils Manual: 0 %
Eosinophils Absolute Manual: 0 (ref 0.00–0.70)
Eosinophils Manual: 0 %
Lymphocytes Absolute Manual: 0.46 — ABNORMAL LOW (ref 0.50–4.40)
Lymphocytes Manual: 100 %
Monocytes Absolute: 0 (ref 0.00–1.20)
Monocytes Manual: 0 %
Neutrophils Absolute Manual: 0 — ABNORMAL LOW (ref 1.80–8.10)
Nucleated RBC: 0 (ref 0–1)
Segmented Neutrophils: 0 %

## 2012-12-11 LAB — CBC AND DIFFERENTIAL
Hematocrit: 23.3 % — ABNORMAL LOW (ref 37.0–47.0)
Hgb: 8 g/dL — ABNORMAL LOW (ref 12.0–16.0)
MCH: 35.1 pg — ABNORMAL HIGH (ref 28.0–32.0)
MCHC: 34.3 g/dL (ref 32.0–36.0)
MCV: 102.2 fL — ABNORMAL HIGH (ref 80.0–100.0)
MPV: 9.8 fL (ref 9.4–12.3)
Platelets: 9 — CL (ref 140–400)
RBC: 2.28 — ABNORMAL LOW (ref 4.20–5.40)
RDW: 19 % — ABNORMAL HIGH (ref 12–15)
WBC: 0.46 — CL (ref 3.50–10.80)

## 2012-12-11 LAB — CELL MORPHOLOGY: Cell Morphology: ABNORMAL — AB

## 2012-12-11 LAB — PREPARE PLATELETS: Platelet Product-: TRANSFUSED

## 2012-12-11 MED ORDER — ACETAMINOPHEN 325 MG PO TABS
650.0000 mg | ORAL_TABLET | Freq: Four times a day (QID) | ORAL | Status: AC | PRN
Start: 2012-12-11 — End: 2012-12-11
  Administered 2012-12-11: 650 mg via ORAL

## 2012-12-11 MED ORDER — DIPHENHYDRAMINE HCL 25 MG PO CAPS
25.0000 mg | ORAL_CAPSULE | Freq: Four times a day (QID) | ORAL | Status: AC | PRN
Start: 2012-12-11 — End: 2012-12-11
  Administered 2012-12-11: 25 mg via ORAL

## 2012-12-11 NOTE — Progress Notes (Signed)
[facility] Granix [facility]. MD order to administer 1 unit of platelets to pt. Premedicated with Benadryl and Tylenol. [facility] currently unavailable, documentation on transfusion record in chart.

## 2012-12-13 ENCOUNTER — Emergency Department
Admission: EM | Admit: 2012-12-13 | Discharge: 2012-12-14 | Disposition: A | Discharge status: Home / Self Care | Payer: 59 | Attending: Emergency Medicine | Admitting: Emergency Medicine

## 2012-12-13 ENCOUNTER — Ambulatory Visit
Admission: RE | Admit: 2012-12-13 | Discharge: 2012-12-13 | Disposition: A | Discharge status: Home / Self Care | Payer: 59 | Source: Ambulatory Visit

## 2012-12-13 ENCOUNTER — Emergency Department: Payer: 59

## 2012-12-13 VITALS — BP 104/51 | HR 94 | Temp 99.5°F | Resp 18

## 2012-12-13 DIAGNOSIS — R51 Headache: Secondary | ICD-10-CM | POA: Insufficient documentation

## 2012-12-13 DIAGNOSIS — J3489 Other specified disorders of nose and nasal sinuses: Secondary | ICD-10-CM | POA: Insufficient documentation

## 2012-12-13 DIAGNOSIS — R509 Fever, unspecified: Secondary | ICD-10-CM | POA: Insufficient documentation

## 2012-12-13 LAB — BASIC METABOLIC PANEL
Anion Gap: 9 (ref 5.0–15.0)
BUN: 15 mg/dL (ref 7.0–19.0)
CO2: 24 mEq/L (ref 22–29)
Calcium: 9.3 mg/dL (ref 8.5–10.5)
Chloride: 107 mEq/L (ref 98–107)
Creatinine: 0.7 mg/dL (ref 0.6–1.0)
Glucose: 109 mg/dL — ABNORMAL HIGH (ref 70–100)
Potassium: 4.1 mEq/L (ref 3.5–5.1)
Sodium: 140 mEq/L (ref 136–145)

## 2012-12-13 LAB — URINALYSIS, REFLEX TO MICROSCOPIC EXAM IF INDICATED
Bilirubin, UA: NEGATIVE
Blood, UA: NEGATIVE
Glucose, UA: NEGATIVE
Ketones UA: NEGATIVE
Leukocyte Esterase, UA: NEGATIVE
Nitrite, UA: NEGATIVE
Protein, UR: NEGATIVE
Specific Gravity UA: 1.02 (ref 1.001–1.035)
Urine pH: 6 (ref 5.0–8.0)
Urobilinogen, UA: NEGATIVE mg/dL

## 2012-12-13 LAB — GFR: EGFR: >60

## 2012-12-13 LAB — HEMOLYSIS INDEX: Hemolysis Index: 9 (ref 0–18)

## 2012-12-13 MED ORDER — ACETAMINOPHEN 500 MG PO TABS
1000.0000 mg | ORAL_TABLET | Freq: Once | ORAL | Status: AC
Start: 2012-12-13 — End: 2012-12-13
  Administered 2012-12-13: 1000 mg via ORAL
  Filled 2012-12-13: qty 2

## 2012-12-13 MED ORDER — SODIUM CHLORIDE 0.9 % IV BOLUS
1000.0000 mL | Freq: Once | INTRAVENOUS | Status: AC
Start: 2012-12-13 — End: 2012-12-13
  Administered 2012-12-13: 1000 mL via INTRAVENOUS

## 2012-12-13 MED ORDER — VANCOMYCIN HCL 1000 MG IV SOLR
1250.0000 mg | Freq: Once | INTRAVENOUS | Status: AC
Start: 2012-12-13 — End: 2012-12-14
  Administered 2012-12-14: 1250 mg via INTRAVENOUS
  Filled 2012-12-13: qty 1250

## 2012-12-13 NOTE — ED Notes (Signed)
C/o on and  Off fever x 1 day and temp today @ home @ 2030 was 100.7 and pt called oncologist and was told to come to ED if temp go up to 100.8 - come to ED. Pt did not take tylenol. Now also c/o headache. Also c/o congestion for months. Denied any cp, cough, urinary symptoms, n/v/d

## 2012-12-13 NOTE — ED Provider Notes (Signed)
EMERGENCY DEPARTMENT HISTORY AND PHYSICAL EXAM     Physician/Midlevel provider first contact with patient: 12/13/12 2213         Date: 12/13/2012  Patient Name: [name]  Attending Physician: Gweneth Dimitri DO  Diagnosis and Treatment Plan       Clinical Impression:   1. Fever        Treatment Plan:   ED Disposition     Discharge [facility]on[facility] Tatum [facility] discharge to home/self care.    Condition at disposition: Stable            History of Presenting Illness     Chief Complaint   Patient presents with   . Fever       History Provided By: Patient   Chief Complaint: Fever  Onset: Yesterday  Timing: Intermittent   Quality: T max 100.8 at home  Modifying Factors: Did not take any medications prior to arrival for fever   Associated sxs: mild frontal HA, rhinorrhea    Additional History: [name] is a 57 y.o. female p/w fever since yesterday.The patient states that her last chemo session was November 17th, and fevers are typical sxs s/p chemo. The fever was resolved yesterday, but started again at noon today. The highest her fever went was 100.7 and when she called her oncologist, she was referred to the ED. The patient states that she did not take any tylenol today. She denies sore throat, ear pain, SOB, CP, N/V/D, dysuria, hematuria, leg pain, or swelling. The pt also c/o HA, rhinorrhea, and some bumps on her back that itch.     PCP: Edwyna Shell, MD      Current facility-administered medications:[COMPLETED] acetaminophen (TYLENOL) tablet 1,000 mg, 1,000 mg, Oral, Once, Madaline Brilliant, DO, 1,000 mg at 12/13/12 2229;  [COMPLETED] sodium chloride 0.9 % bolus 1,000 mL, 1,000 mL, Intravenous, Once, Madaline Brilliant, DO, 1,000 mL at 12/13/12 2250  vancomycin (VANCOCIN) 1,250 mg in sodium chloride 0.9 % 250 mL IVPB, 1,250 mg, Intravenous, Once in ED, Madaline Brilliant, DO, Last Rate: 166.7 mL/hr at 12/14/12 0018, 1,250 mg at 12/14/12 0018  Current outpatient prescriptions:allopurinol  (ZYLOPRIM) 100 MG tablet, Take 100 mg by mouth 3 (three) times daily., Disp: , Rfl: ;  dexamethasone (MAXIDEX) 0.1 % ophthalmic suspension, 1 drop 2 (two) times daily., Disp: , Rfl: ;  famotidine (PEPCID) 20 MG tablet, Take 1 tablet (20 mg total) by mouth 2 (two) times daily., Disp: 60 tablet, Rfl: 0;  fluconazole (DIFLUCAN) 100 MG tablet, Take 100 mg by mouth daily., Disp: , Rfl:   levofloxacin (LEVAQUIN) 500 MG tablet, Take 500 mg by mouth daily., Disp: , Rfl: ;  prochlorperazine (COMPAZINE) 10 MG tablet, Take 10 mg by mouth every 6 (six) hours as needed., Disp: , Rfl: ;  pyridoxine (B-6) 100 MG tablet, Take 100 mg by mouth daily., Disp: , Rfl: ;  valacyclovir (VALTREX) 1000 MG tablet, Take 1,000 mg by mouth 2 (two) times daily., Disp: , Rfl:   Facility-Administered Medications Ordered in Other Encounters: [COMPLETED] tbo-filgrastim (GRANIX) injection 300 mcg, 300 mcg, Subcutaneous, Once, Aksentijevich, Ivan, MD, 300 mcg at 12/13/12 1229    Past Medical History     Past Medical History   Diagnosis Date   . Asthma without status asthmaticus    . Leukemia      Past Surgical History   Procedure Date   . Hand surgery        Family History     No  family history on file.    Social History     History     Social History   . Marital Status: Single     Spouse Name: N/A     Number of Children: N/A   . Years of Education: N/A     Social History Main Topics   . Smoking status: Never Smoker    . Smokeless tobacco: Not on file   . Alcohol Use: No      Comment: very rarely   . Drug Use: No   . Sexually Active: Not on file     Other Topics Concern   . Not on file     Social History Narrative   . No narrative on file        Allergies     Allergies   Allergen Reactions   . Codeine Nausea Only   . Morphine Nausea And Vomiting   . Vicodin (Hydrocodone-Acetaminophen)      Nausea       Review of Systems     General: + Fever. No sweats or chills.  HENT: + Headache, + rhinorrhea. No neck pain, no nasal congestion, no sore  throat  Respiratory: No cough, no shortness of breath.  Cardiovascular: No chest pain, no leg swelling.   Gastrointestinal: No abdominal pain, no nausea, no vomiting, no diarrhea.   Genito-Urinary: No dysuria, no hematuria  Musculoskeletal: No myalgias, no joint pains.   Neurological: No new focal weakness, no sensory changes.   Dermatological: No new rashes, no color changes. + Bumps on back, itchiness   Psychological: No acute mood changes, no confusion. No SI.     Physical Exam     BP 106/55  Pulse 103  Temp 98.1 F (36.7 C)  Resp 18  Ht 1.651 m  Wt 53.071 kg  BMI 19.47 kg/m2  SpO2 100%    Constitutional: Vital signs reviewed. Well appearing, no apparent distress. Thin and cachetic   Head: Normocephalic, atraumatic. No external trauma noted.  Eyes: Conjunctiva and sclera are normal. Extraocular movements intact, pupils equal, round, reactive.  Ear, Nose, Throat:  Normal external examination of the nose and ears. Oropharynx clear, moist mucous membranes. No tonsillar swelling or exudates, some small sores in buccal membrane, TMs clear bilaterally.   Neck: Supple. Trachea midline.  No cervical lymphadenopathy. No midline cervical spine tenderness.  Respiratory/Chest: Clear to auscultation. No respiratory distress. No wheezing, rhonchi or rales. IV line in the right chest, no signs of purulent drainage.   Cardiovascular: Regular rate. Regular rhythm. S1, S2.   Abdomen: Normoactive Bowel sounds. Soft. Non-tender to palpation. No guarding or rebound.  Back: No midline tenderness, no CVA tenderness.   Extremities: Upper and lower extremities with no cyanosis. No edema. No calf tenderness. Normal +2 pulses in all extremities.  Skin: Warm and dry. Small rash to back without evidence induration, fluctuance, erythema.   Neuro: alert and appropriate,  upper and lower extremities normal strength, sensation intact, normal speech, normal gait, no facial droop.     Diagnostic Study Results     Labs -     Results      Procedure Component Value Units Date/Time    Manual Differential [[id number]]  (Abnormal) Collected:12/13/12 2230     Lymphocytes Manual 100 % Updated:12/14/12 0102     Absolute Lymph Manual 0.41 (L)     Cell MorpHology [[id number]]  (Abnormal) Collected:12/13/12 2230     Cell Morphology: Abnormal (A) Updated:12/14/12 0102     Anisocytosis =  1+ (A)      Poikilocytosis =1+ (A)      Macrocytic =1+ (A)     CBC and differential [[id number]]  (Abnormal) Collected:12/13/12 2230    Specimen Information:Blood / Blood Updated:12/14/12 0041     WBC 0.41 (LL)      RBC 2.24 (L)      Hgb 7.8 (L) g/dL      Hematocrit 95.6 (L) %      MCV 105.4 (H) fL      MCH 34.8 (H) pg      MCHC 33.1 g/dL      RDW 18 (H) %      Platelets 29 (L)      MPV 11.0 fL     Basic Metabolic Panel (BMP) [[id number]]  (Abnormal) Collected:12/13/12 2230    Specimen Information:Blood Updated:12/13/12 2319     Glucose 109 (H) mg/dL      BUN 46.9 mg/dL      Creatinine 0.7 mg/dL      CALCIUM 9.3 mg/dL      Sodium 629 mEq/L      Potassium 4.1 mEq/L      Chloride 107 mEq/L      CO2 24 mEq/L      Anion Gap 9.0     Hemolysis index [[id number]] Collected:12/13/12 2230     Hemolysis Index 9 Updated:12/13/12 2319    GFR [[id number]] Collected:12/13/12 2230     EGFR >60.0 Updated:12/13/12 2319    UA, Reflex to Microscopic [[id number]] Collected:12/13/12 2230    Specimen Information:Urine Updated:12/13/12 2306     Urine Type Clean Catch      Color, UA Yellow      Clarity, UA Clear      Specific Gravity UA 1.020      Urine pH 6.0      Leukocyte Esterase, UA Negative      Nitrite, UA Negative      Protein, UR Negative      Glucose, UA Negative      Ketones UA Negative      Urobilinogen, UA Negative mg/dL      Bilirubin, UA Negative      Blood, UA Negative     Blood Culture #1 [[id number]] Collected:12/13/12 2230    Specimen Information:Blood / Blood Updated:12/13/12 2302    Narrative:    8ml required    Blood Culture #2 [[id number]] Collected:12/13/12 2230    Specimen  Information:Blood / Blood Updated:12/13/12 2302    Narrative:    8ml required          Radiologic Studies -   Radiology Results (24 Hour)     Procedure Component Value Units Date/Time    Chest AP Portable [[id number]] Collected:12/13/12 2230    Order Status:Completed  Updated:12/13/12 2235    Narrative:    HISTORY: Fever    .    TECHNIQUE: Single frontal view of the chest was obtained.     PRIORS: 10/16/2012.    FINDINGS: The lung fields are clear. There are no pleural effusions. The  cardiac silhouette and hila are normal. The trachea is midline. The bony  structures are essentially unremarkable. There is a right-sided central  venous catheter with tip in the SVC.       Impression:     No active lung disease.    Georgana Curio, MD   12/13/2012 10:31 PM      .    Medical Decision Making   I am the first provider for this patient.  I reviewed the vital signs, available nursing notes, past medical history, past surgical history, family history and social history.    Vital Signs-Reviewed the patient's vital signs.     Patient Vitals for the past 12 hrs:   BP Temp Pulse Resp   12/14/12 0126 - 98.1 F (36.7 C) - -   12/14/12 0056 106/55 mmHg - - -   12/13/12 2147 115/54 mmHg 100.2 F (37.9 C) 103  18      Pulse ox: 100% on RA-normal.     ED Course:  11:44 PM - Discussed pt case with Dr. Ambrose [place], oncology, who wants [place] to give her 1 dose of Vancomycin and to send her home. Wants her to call him tomorrow morning.   12:44 AM - Discussed test results with pt and discussed plan to follow up with her oncologist tomorrow.   1:22 AM - Patient is ambulating in the hall. Has slight amount of vancomycin left in her IV.   1:26 AM  Pt is feeling better and would like to go home.  Discussed test results with pt and counseled on diagnosis, f/u plans, and signs and symptoms when to return to ED. Pt is stable and ready for discharge.      Provider Notes: MDM: Pt with setting of fever in chemotherapy for leukemia, was told to  come in if 100.8 or more and was at home so came in but didn't take anything prior to coming in, 100.2 here, will pan culture and d/w oncology pending results. DDx includes neutropenic fever, pneumonia, UTI, bacteremia.       Doctor's Notes     Throughout the stay in the Emergency Department, questions and concerns surrounding pain control, care plans, diagnostic studies, effects of medications administered or prescribed, and future prognostic dilemmas were assessed and addressed.    ROS addendum: The patient and/or family was asked if they had any other complaints or concerns that we could address today and nothing of significance was noted.     _______________________________  Medical DeMedical Decision Makingcision Stacy Gardner  Attestations:    This note is prepared by Belia Heman, acting as Scribe for Gweneth Dimitri, DO.     Gweneth Dimitri, DO: The scribe's documentation has been prepared under my direction and personally reviewed by me in its entirety.  I confirm that the note above accurately reflects all work, treatment, procedures, and medical decision making performed by me.        Madaline Brilliant, DO  12/17/12 1725

## 2012-12-13 NOTE — Plan of Care (Signed)
Patient came to unit [facility] as a outpatient for granix injection.  Vital signs stable and no pain.  Administered granix SQ in abdomen no side effects noted.  Patient discharged ambulatory in stable condition.

## 2012-12-14 ENCOUNTER — Ambulatory Visit
Admission: RE | Admit: 2012-12-14 | Discharge: 2012-12-14 | Disposition: A | Discharge status: Home / Self Care | Payer: 59 | Source: Ambulatory Visit

## 2012-12-14 LAB — CBC AND DIFFERENTIAL
Hematocrit: 23.6 % — ABNORMAL LOW (ref 37.0–47.0)
Hgb: 7.8 g/dL — ABNORMAL LOW (ref 12.0–16.0)
MCH: 34.8 pg — ABNORMAL HIGH (ref 28.0–32.0)
MCHC: 33.1 g/dL (ref 32.0–36.0)
MCV: 105.4 fL — ABNORMAL HIGH (ref 80.0–100.0)
MPV: 11 fL (ref 9.4–12.3)
Platelets: 29 — ABNORMAL LOW (ref 140–400)
RBC: 2.24 — ABNORMAL LOW (ref 4.20–5.40)
RDW: 18 % — ABNORMAL HIGH (ref 12–15)
WBC: 0.41 — CL (ref 3.50–10.80)

## 2012-12-14 LAB — CELL MORPHOLOGY: Cell Morphology: ABNORMAL — AB

## 2012-12-14 LAB — TYPE AND SCREEN
AB Screen Gel: NEGATIVE
ABO Rh: O POS

## 2012-12-14 LAB — MAN DIFF ONLY
Lymphocytes Absolute Manual: 0.41 — ABNORMAL LOW (ref 0.50–4.40)
Lymphocytes Manual: 100 %

## 2012-12-14 NOTE — Addendum Note (Signed)
Encounter addended by: Reynold Bowen, RN on: 12/14/2012  9:14 AM<BR>     Documentation filed: Visit Diagnoses, Charges VN

## 2012-12-15 ENCOUNTER — Ambulatory Visit
Admission: RE | Admit: 2012-12-15 | Discharge: 2012-12-15 | Disposition: A | Discharge status: Home / Self Care | Payer: 59 | Source: Ambulatory Visit | Attending: Hematology & Oncology | Admitting: Hematology & Oncology

## 2012-12-15 VITALS — BP 125/58 | HR 88 | Temp 96.2°F | Resp 18 | Ht 66.0 in | Wt 117.0 lb

## 2012-12-15 DIAGNOSIS — C9200 Acute myeloblastic leukemia, not having achieved remission: Secondary | ICD-10-CM | POA: Insufficient documentation

## 2012-12-15 LAB — RED BLOOD CELLS OR HOLD
RBC Leukoreduced Irradiated: TRANSFUSED
RBC Leukoreduced Irradiated: TRANSFUSED

## 2012-12-15 MED ORDER — DIPHENHYDRAMINE HCL 25 MG PO CAPS
ORAL_CAPSULE | ORAL | Status: AC
Start: 2012-12-15 — End: 2012-12-15
  Administered 2012-12-15: 25 mg
  Filled 2012-12-15: qty 1

## 2012-12-15 MED ORDER — ACETAMINOPHEN 325 MG PO TABS
ORAL_TABLET | ORAL | Status: AC
Start: 2012-12-15 — End: 2012-12-15
  Administered 2012-12-15: 650 mg
  Filled 2012-12-15: qty 2

## 2012-12-15 NOTE — Progress Notes (Signed)
Pt arrived ambulatory accompanied by her friend for blood transfusion in stable condition. VSS and voiced no c/o dyspnea or discomfort. Re enforced knowledge about blood transfusion and will monitor change in condition closely throughout transfusion. Flushed double lumen [place] and noted brisk blood returned. Pre medicated with Tylenol and benadryl per order. Started first unit and within 25 minutes of transfusion patient c/o dizziness and shortness of breath. Stopped transfusion and notified Dr. Bing Matter. Received instruction to administer another does of Tylenol and Benadryl then continue the transfusion. Pt. Completed 2 units of red blood cell transfusion without any further complaint. Instructed patient to f/u with her doctor and call with question prn. Discharged home with friend in stable condition.

## 2012-12-15 NOTE — Discharge Instructions (Signed)
When You Need a Blood Transfusion (Adult)  A blood transfusion is often done when a person has lost blood because of an injury or during surgery. It can also be done because of diseases or conditions that affect the blood. Blood is made up of several different parts (blood products). You may receive some or all of these blood products during a transfusion. Blood for transfusion is usually donated from another person (donor). Strict measures are taken to make sure that donated blood is safe before it's given to you. This sheet helps you understand how a blood transfusion is done. Your health care provider will discuss your condition with you and answer your questions.  The Parts of Blood  Blood can be broken down into different parts that perform special roles in the body. These parts include:   Red blood cells,which carry oxygen throughout the body.   Platelets,which help stop bleeding.   Plasma (the liquid part of blood), which carries red blood cells and platelets throughout the body. Plasma also helps platelets in stopping bleeding.  Where Does Donated Blood Come From?   Volunteer donors. These are people who donate their blood to help others in need of blood. Blood donation can take place at several places, including a hospital, blood bank, or during a blood drive.   Directed donation. A person may need a blood transfusion during a planned surgery. Family and friends can have their blood tested for compatibility and donate blood for the patient before the surgery. This needs to be done at least7day(s) in advance. This is because the blood must be tested for safety.   Autologous donation. This is also called self-donation. For planned surgery, a person can donate his or her own blood starting up to6weeks before surgery. Doctors often recommend this kind of donation because it is the safest, since the patient's own blood is used for transfusion.  Are Blood Transfusions Safe?  Except for self-donated  blood, all donated blood is tested and processed to make sure that the blood is safe:   The health and medical history of each donor is carefully screened. If a person is considered high-risk for infection or problems, he or she isn't accepted as a blood donor.   Donated blood is tested for infections such as hepatitis, syphilis, and HIV (the virus that causes AIDS). If the tested blood is found to be unsafe, it's destroyed.   Blood is divided into four types: A, B, AB, and O. Blood also has Rh types: positive (+) and negative (-). You can only receive blood products that are compatible with (match) your blood type. A sample of your blood is tested for compatibility with donated blood. This is done before blood products are prepared for a transfusion.  How Is a Blood Transfusion Done?  A blood transfusion takes place in a blood center, hospital room, or operating room. It usually lasts1-2hours. Your health care provider will discuss the blood transfusion with you before it's done. You'll need to give permission for the blood transfusion by signing a consent form.   Two health care providers confirm your identity. They also confirm that they have the correct blood product(s) for you.   An intravenous (IV) line is placed in a vein if you do not already have an IV.   The blood product comes in a plastic bag that is hung on an IV pole. The blood product flows from the bag into your IV line. The IV line may be connected to   a pump, which controls the transfusion rate. You may receive more than one kind of blood product through the IV.   Your vital signs (blood pressure, heart rate, respiratory rate, and temperature) are checked throughout the transfusion. This is to make sure you are not having a reaction to the blood product.   The IV line may be removed once the transfusion is complete.  Possible Risks and Complications of Blood Transfusions  Most transfusions are problem free. In some cases, reactions occur.  These can happen within seconds to minutes during the transfusion or a week to a few months after the transfusion. Call your doctor or nurse right away if you have any of the signs or symptoms in the table below during or after a transfusion:  Reaction Timing Signs and Symptoms   Allergic reaction (mild)  Within seconds to minutes during the transfusion   Up to 24 hours after the transfusion Hives or red welts on the skin, mild itching, rash, localized swelling. flushing (red face), wheezing, shortness of breath, or stridor (high-pitched noise or sound)   Anaphylactic reaction  Within seconds to minutes during the transfusion   Up to 24 hours after the transfusion Shortness of breath, flushing (red face), wheezing, labored (working hard) breathing, low blood pressure, localized swelling, chest tightness, or cramps   Febrile nonhemolytic reaction  Within minutes to hours during the transfusion   Up to 24 hours after the transfusion Fever (increased of 1 C or higher), chills, flushing (red face), nausea, headache, minor discomfort, or mild shortness of breath   Acute immune hemolytic reaction  Within minutes during the transfusion   Up to 24 hours after the transfusion Fever, red or brown urine, back pain, fast heart rate (tachycardia), abdominal pain, low blood pressure, feeling anxious, chills, chest pain, nausea, or fainting spells   Transfusion-related acute lung injury (TRALI)  Within 1 to 2 hours during the transfusion   Up to 6 hours after the transfusion Shortness of breath, trouble breathing, low blood pressure, fever, pulmonary edema   Transfusion-associated circulatory overload  Near the end of the transfusion   Within 6 hours after the transfusion Shortness of breath, fast heart rate (tachycardia), problems breathing when lying on back, abnormal blood pressure   Post-transfusion purpura (PUP)  Within 1 week   Up to 48 days after the transfusion Purple spots on skin; nose bleed; bleeding from  theurinary tract, abdomen, colon, or rectum; fever; or chills   "Delayed" transfusion-related acute lung injury (TRALI)  Within 72 hours (3 days) after the transfusion "Sudden" onset of respiratory distress or trouble breathing   "Delayed" hemolytic reaction  Within 3 to 7 days   Up to weeks after the transfusion Low-grade fever, mild jaundice (yellowing of the skin and whites of the eyes), decrease in hematocrit, chills, chest pain, back pain, nausea      2000-2014 [employer], [address], [place] [address]. All rights reserved. This information is not intended as a substitute for professional medical care. Always follow your healthcare professional's instructions.      Blood and Blood Product Transfusions for Cancer  A blood transfusion is when blood or parts of the blood are given to a person through an IV line placed in a vein. The blood and blood parts used for transfusion are called blood products. The blood usually comes from another person (donor). This sheet tells you more about how blood and blood products may be used to help treat cancer.  Understanding   Blood and Blood Parts    Blood is a fluid that flows throughout the body. It is made up of different parts that perform specific roles.   Red blood cells(RBCs) carry oxygen throughout the body.   White blood cells (WBCs) are part of the body's immune system. Their main job is to help fight infections and diseases.   Platelets are fragments of blood cells that help with clotting. When you have a cut or bruise, platelets come together to form a clot or "plug." This helps to control bleeding, so you don't lose too much blood.   Plasma is the liquid portion of blood. It carries the different types of blood cells to all the parts of the body. Plasma also carries proteins called clotting factors. Clotting factors help platelets with the clotting process.  Blood is divided into four types: A, B, AB, and O. Blood also has Rh types:  positive (+) and negative (-). Any blood products you receive during a transfusion must be compatible with your blood type.  Why a Transfusion May Be Done  Cancer can cause various problems that may require treatment with transfusions. For example:   Cancer can affect the bone marrow. This is the soft, spongy part inside bones where most of the body's blood cells are made. When the bone marrow is damaged or destroyed, the body cannot make enough blood cells. Without enough blood cells, the body cannot maintain its normal functions.   Cancer can cause anemia. This condition occurs when there are too few red blood cells in the body. Without enough red blood cells, the body's tissues and organs do not receive enough oxygen. Anemia can make you feel tired or short of breath.   Occasionally, certain cancers can cause internal bleeding. This can lead to blood loss that can threaten your health.  Certain treatments for cancer can lower the number of healthy blood cells in the body and require the use of transfusions. These treatments include:   Chemotherapy (chemo) uses strong medications to help kill cancer cells. However, these medications can also damage healthy cells, including cells in the bone marrow. This can lower your blood cell counts.   Radiation uses high-energy x-rays to help kill cancer cells. As with chemo, this treatment can also damage healthy cells in the bone marrow. This can lower your blood cell counts.   Surgery may be needed to remove a tumor (group of cancerous cells) in the body. The surgery can cause blood loss that requires the use of transfusions.  Types of Transfusions  Depending on your needs, your doctor may recommend one or more of the blood products listed below as part of your treatment plan. Your doctor will explain to you how the transfusions will be given and how often they may be needed. Before receiving any blood products, you will need to have some blood drawn so your blood  type can be determined. You will also need to sign a consent form acknowledging the potential risks of receiving a transfusion.   RBC transfusions. These are most often used to treat severe anemia or blood loss. RBCs must be "typed" to match your blood type. Except in severe blood loss, cancer patients receive "packed" red blood cells without plasma. Each bag (called a "unit") takes about 2 hours to infuse. During that time, nursing staff will be monitoring your temperature, pulse and blood pressure.   Platelet transfusions. These are used if your platelet count is too low, which puts you at   high risk of bleeding. Although platelets should be "typed" to match your blood type, it is not required. Platelets can be obtained in different ways:   from one donor ("apheresis product")   combined from several bags of whole blood ("pooled product")   from a community donor who is specially matched ("matched product")  A bag of platelets takes about 1 hour or less to infuse. As with RBCs, nurses will monitor your temperature, pulse, and blood pressure.   Plasma (FFP) transfusions. These may be used to supply the blood with more clotting factors to help stop excess bleeding. FFP must be "typed" to match your blood type. One unit of plasma is taken from a unit of whole blood and is then frozen at the blood bank. Plasma is thawed when it is needed. FFP usually takes 1 to 2 hours to infuse.   WBC transfusions. Due to the severe risks involved, these transfusions are rarely used. If there is a problem with the WBCs, your doctor may recommend other treatments to help promote the growth of new WBCs.  Risks and Possible Complications of Blood and Blood Product Transfusions Include:   Fever and chills   Allergic reaction (itchy skin or rash; redness, or flushing of the face)   Chest pain   Low blood pressure  In the rare event of receiving the wrong ABO blood type:   Back pain   Fast heart rate   Low blood  pressure   Nausea  Although extremely rare, some diseases can be transmitted through blood transfusions. They include:   Hepatitis B   Hepatitis C   HIV   Bacterial infections  Learning More About the Safety of Blood Donation and Blood Transfusions  Strict measures are taken to make sure that donated blood and blood products are safe before they are given to you. To learn more about where donated blood comes from and the process of screening blood, these websites may help:   American [employer], Blood Donation and Transfusion, www.cancer.org/Treatment/TreatmentsandSideEffects/TreatmentTypes/BloodProductDonationandTransfusion/index   [employer], Learn About Blood, www.redcrossblood.org/learn-about-blood    2000-2014 [employer], [address], [place] [address]. All rights reserved. This information is not intended as a substitute for professional medical care. Always follow your healthcare professional's instructions.

## 2013-01-09 ENCOUNTER — Ambulatory Visit: Payer: No Typology Code available for payment source

## 2013-01-09 ENCOUNTER — Ambulatory Visit (INDEPENDENT_AMBULATORY_CARE_PROVIDER_SITE_OTHER): Payer: No Typology Code available for payment source | Admitting: Emergency Medicine

## 2013-01-09 VITALS — BP 100/58 | HR 83 | Temp 98.0°F | Resp 16 | Ht 65.75 in | Wt 114.6 lb

## 2013-01-09 DIAGNOSIS — C9200 Acute myeloblastic leukemia, not having achieved remission: Secondary | ICD-10-CM | POA: Insufficient documentation

## 2013-01-09 DIAGNOSIS — M25579 Pain in unspecified ankle and joints of unspecified foot: Secondary | ICD-10-CM

## 2013-01-09 DIAGNOSIS — M25572 Pain in left ankle and joints of left foot: Secondary | ICD-10-CM

## 2013-01-09 LAB — POCT CBC
Hemoglobin: 9.7 g/dL — AB (ref 12.2–16.2)
Lymph, poc: 0.7 (ref 0.6–3.4)
MCHC: 30.1 g/dL — AB (ref 31.8–35.4)
MPV: 9.1 fL (ref 0–99.8)
POC Granulocyte: 1.3 — AB (ref 2–6.9)
POC LYMPH PERCENT: 30.1 %L (ref 10–50)
POC MID %: 17.2 %M — AB (ref 0–12)
RDW, POC: 23 %

## 2013-01-09 LAB — BASIC METABOLIC PANEL
CO2: 30 mEq/L (ref 19–32)
Calcium: 9.4 mg/dL (ref 8.4–10.5)
Creat: 0.64 mg/dL (ref 0.50–1.10)
Glucose, Bld: 73 mg/dL (ref 70–99)
Potassium: 4.3 mEq/L (ref 3.5–5.3)
Sodium: 142 mEq/L (ref 135–145)

## 2013-01-09 LAB — URIC ACID: Uric Acid, Serum: 3.5 mg/dL (ref 2.4–7.0)

## 2013-01-09 MED ORDER — PREDNISONE 10 MG PO TABS: ORAL_TABLET | ORAL | Status: DC

## 2013-01-09 NOTE — Progress Notes (Addendum)
Subjective:    Patient ID: Barbara Benitez, female    DOB: 1955-06-24, 57 y.o.   MRN: [id number]   HPI  This chart was scribed for Collene Gobble, MD, by Ellin Mayhew, ED Scribe. This patient was seen in room 02 and the patient's care was started at 8:38 AM.  HPI Comments: Barbara Benitez is a 57 y.o. Female, with a history of AML leukemia, who presents to the [facility] complaining of a left ankle swelling. Patient reports that six days ago, she was raking leaves with a relative for a few hours with no symptoms or trauma noted; however, the following Monday night, she noticed her L ankle swollen and red. She reports she rested and iced her ankle with some relief; however, it is currently sore and red with warmness. She reports that her swelling is worsened at night which is when the redness and warmth is at its worst. The patient reports that temporary ankle swelling is common with the chemotherapy treatment.  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 She began chemotherapy on 11/30/2012 and continued every other day for three sessions lasting a couple of hours per session. She was initially supposed to began last week but it was delayed due to the hospital. She also reports having had a blood transfusion. She denies ever having being informed about possible chemotherapy affects on kidney function. Her chemotherapy began on   Past Medical History  Diagnosis Date  . Allergy   . Asthma   . Blood transfusion without reported diagnosis   . Cancer     Past Surgical History  Procedure Laterality Date  . Tendon Left     Hand    Family History  Problem Relation Age of Onset  . Hyperlipidemia Mother   . Hypertension Mother   . Heart disease Father     History   Social History  . Marital Status: Single    Spouse Name: N/A    Number of Children: N/A  . Years  of Education: N/A   Occupational History  . Not on file.   Social History Main Topics  . Smoking status: Never Smoker   . Smokeless tobacco: Not on file  . Alcohol Use: No  . Drug Use: No  . Sexual Activity: Not on file   Other Topics Concern  . Not on file   Social History Narrative  . No narrative on file    Allergies  Allergen Reactions  . Codeine Nausea Only  . Percocet [Oxycodone-Acetaminophen] Nausea Only  . Vicodin [Hydrocodone-Acetaminophen] Nausea Only    There are no active problems to display for this patient.   No results found for this or any previous visit.  No diagnosis found.  Meds ordered this encounter  Medications  . allopurinol (ZYLOPRIM) 100 MG tablet    Sig: Take 100 mg by mouth daily.  Marland Kitchen dexamethasone (DECADRON) 0.1 % ophthalmic solution    Sig: every 3 (three) hours.  . fluconazole (DIFLUCAN) 100 MG tablet    Sig: Take 100 mg by mouth daily.  Marland Kitchen levofloxacin (LEVAQUIN) 500 MG tablet    Sig: Take 500 mg by mouth daily.  . valACYclovir (VALTREX) 1000 MG tablet    Sig: Take 1,000 mg by mouth 2 (two) times daily.  Marland Kitchen pyridOXINE (VITAMIN B-6) 50 MG tablet    Sig: Take 50 mg by mouth daily.  BP 100/58  Pulse 83  Temp(Src) 98 F (36.7 C) (Oral)  Resp 16  Ht 5' 5.75" (1.67 m)  Wt 114 lb 9.6 oz (51.982 kg)  BMI 18.64 kg/m2  SpO2 98%   Review of Systems  Constitutional: Negative for fever, chills, activity change and appetite change.  HENT: Negative for congestion, ear pain and sore throat.   Respiratory: Negative for cough and shortness of breath.   Cardiovascular: Negative for chest pain and leg swelling.  Gastrointestinal: Negative for nausea, vomiting and diarrhea.  Musculoskeletal: Positive for joint swelling.  Neurological: Negative for light-headedness and headaches.   A complete 10 system review of systems was obtained and all systems are negative except as noted in the HPI and PMH.      Objective:   Physical  Exam CONSTITUTIONAL: Well developed/well nourished HEAD: Normocephalic/atraumatic EYES: EOMI/PERRL ENMT: Mucous membranes moist NECK: supple no meningeal signs SPINE:entire spine nontender CV: S1/S2 noted, no murmurs/rubs/gallops noted LUNGS: Lungs are clear to auscultation bilaterally, no apparent distress ABDOMEN: soft, nontender, no rebound or guarding GU:no cva tenderness MUSC: L ankle non-erythematous swelling over the lateral malleolus  NEURO: Pt is awake/alert, moves all extremitiesx4 EXTREMITIES: pulses normal, full ROM SKIN: warm, color normal PSYCH: no abnormalities of mood noted [facility] reading (PRIMARY) by  Dr. Cleta Alberts no fracture seen no abnormality Results for orders placed in visit on 01/09/13  POCT CBC      Result Value Range   WBC 2.4 (*) 4.6 - 10.2 K/uL   Lymph, poc 0.7  0.6 - 3.4   POC LYMPH PERCENT 30.1  10 - 50 %L   MID (cbc) 0.4  0 - 0.9   POC MID % 17.2 (*) 0 - 12 %M   POC Granulocyte 1.3 (*) 2 - 6.9   Granulocyte percent 52.7  37 - 80 %G   RBC 3.02 (*) 4.04 - 5.48 M/uL   Hemoglobin 9.7 (*) 12.2 - 16.2 g/dL   HCT, POC 16.1 (*) 09.6 - 47.9 %   MCV 106.5 (*) 80 - 97 fL   MCH, POC 32.1 (*) 27 - 31.2 pg   MCHC 30.1 (*) 31.8 - 35.4 g/dL   RDW, POC 04.5     Platelet Count, POC 157  142 - 424 K/uL   MPV 9.1  0 - 99.8 fL    Assessment & Plan:   We'll treat with low-dose prednisone  A basic metabolic panel was done along with a uric acid. Patient is pancytopenic and is currently undergoing aggressive chemotherapy for leukemia

## 2013-01-09 NOTE — Patient Instructions (Addendum)
Try and rest her left ankle. When you are resting keep your  ankle elevated. Take your medications as prescribed.

## 2013-01-09 NOTE — Progress Notes (Deleted)
   Subjective:    Patient ID: Barbara Benitez, female    DOB: [birth year], 57 y.o.   MRN: [id number]  HPI    Review of Systems     Objective:   Physical Exam        Assessment & Plan:

## 2013-01-27 MED ORDER — ACETAMINOPHEN 325 MG PO TABS
650.0000 mg | ORAL_TABLET | ORAL | Status: AC
Start: 2013-01-29 — End: 2013-01-29

## 2013-01-27 MED ORDER — DIPHENHYDRAMINE HCL 50 MG/ML IJ SOLN
25.0000 mg | INTRAMUSCULAR | Status: AC
Start: 2013-01-29 — End: 2013-01-29

## 2013-01-27 MED ORDER — DIPHENHYDRAMINE HCL 25 MG PO CAPS
25.0000 mg | ORAL_CAPSULE | ORAL | Status: AC
Start: 2013-01-29 — End: 2013-01-29

## 2013-01-29 ENCOUNTER — Ambulatory Visit
Admission: RE | Admit: 2013-01-29 | Discharge: 2013-01-29 | Disposition: A | Discharge status: Home / Self Care | Payer: 59 | Source: Ambulatory Visit | Attending: Hematology & Oncology | Admitting: Hematology & Oncology

## 2013-01-29 VITALS — BP 83/53 | HR 85 | Temp 99.6°F | Ht 65.0 in | Wt 117.0 lb

## 2013-01-29 DIAGNOSIS — C9200 Acute myeloblastic leukemia, not having achieved remission: Secondary | ICD-10-CM | POA: Insufficient documentation

## 2013-01-29 LAB — PREPARE PLATELETS
Platelet Product-: TRANSFUSED
Platelet Product-: TRANSFUSED

## 2013-01-29 MED ORDER — ACETAMINOPHEN 325 MG PO TABS
ORAL_TABLET | ORAL | Status: AC
Start: 2013-01-29 — End: 2013-01-29
  Administered 2013-01-29: 650 mg via ORAL
  Filled 2013-01-29: qty 2

## 2013-01-29 MED ORDER — DIPHENHYDRAMINE HCL 25 MG PO CAPS
ORAL_CAPSULE | ORAL | Status: AC
Start: 2013-01-29 — End: 2013-01-29
  Administered 2013-01-29: 25 mg via ORAL
  Filled 2013-01-29: qty 1

## 2013-01-29 NOTE — Progress Notes (Signed)
Pt arrived ambulatory condition for 2 units of platelets. Pt presented with low grade fever, called MD office to inform, no orders given. Pt tolerated transfusion through well. No complaints voiced. VSS monitored throughout, fever remained low grade. [place] flushed upon completion. Pt d/c home in stable condition, instructed to contact MD for any additional concerns or if fever persists.

## 2013-01-29 NOTE — Discharge Instructions (Signed)
When You Need a Blood Transfusion (Adult)  A blood transfusion is often done when a person has lost blood because of an injury or during surgery. It can also be done because of diseases or conditions that affect the blood. Blood is made up of several different parts (blood products). You may receive some or all of these blood products during a transfusion. Blood for transfusion is usually donated from another person (donor). Strict measures are taken to make sure that donated blood is safe before it's given to you. This sheet helps you understand how a blood transfusion is done. Your health care provider will discuss your condition with you and answer your questions.  The Parts of Blood  Blood can be broken down into different parts that perform special roles in the body. These parts include:   Red blood cells,which carry oxygen throughout the body.   Platelets,which help stop bleeding.   Plasma (the liquid part of blood), which carries red blood cells and platelets throughout the body. Plasma also helps platelets in stopping bleeding.  Where Does Donated Blood Come From?   Volunteer donors. These are people who donate their blood to help others in need of blood. Blood donation can take place at several places, including a hospital, blood bank, or during a blood drive.   Directed donation. A person may need a blood transfusion during a planned surgery. Family and friends can have their blood tested for compatibility and donate blood for the patient before the surgery. This needs to be done at least7day(s) in advance. This is because the blood must be tested for safety.   Autologous donation. This is also called self-donation. For planned surgery, a person can donate his or her own blood starting up to6weeks before surgery. Doctors often recommend this kind of donation because it is the safest, since the patient's own blood is used for transfusion.  Are Blood Transfusions Safe?  Except for self-donated  blood, all donated blood is tested and processed to make sure that the blood is safe:   The health and medical history of each donor is carefully screened. If a person is considered high-risk for infection or problems, he or she isn't accepted as a blood donor.   Donated blood is tested for infections such as hepatitis, syphilis, and HIV (the virus that causes AIDS). If the tested blood is found to be unsafe, it's destroyed.   Blood is divided into four types: A, B, AB, and O. Blood also has Rh types: positive (+) and negative (-). You can only receive blood products that are compatible with (match) your blood type. A sample of your blood is tested for compatibility with donated blood. This is done before blood products are prepared for a transfusion.  How Is a Blood Transfusion Done?  A blood transfusion takes place in a blood center, hospital room, or operating room. It usually lasts1-2hours. Your health care provider will discuss the blood transfusion with you before it's done. You'll need to give permission for the blood transfusion by signing a consent form.   Two health care providers confirm your identity. They also confirm that they have the correct blood product(s) for you.   An intravenous (IV) line is placed in a vein if you do not already have an IV.   The blood product comes in a plastic bag that is hung on an IV pole. The blood product flows from the bag into your IV line. The IV line may be connected to   a pump, which controls the transfusion rate. You may receive more than one kind of blood product through the IV.   Your vital signs (blood pressure, heart rate, respiratory rate, and temperature) are checked throughout the transfusion. This is to make sure you are not having a reaction to the blood product.   The IV line may be removed once the transfusion is complete.  Possible Risks and Complications of Blood Transfusions  Most transfusions are problem free. In some cases, reactions occur.  These can happen within seconds to minutes during the transfusion or a week to a few months after the transfusion. Call your doctor or nurse right away if you have any of the signs or symptoms in the table below during or after a transfusion:  Reaction Timing Signs and Symptoms   Allergic reaction (mild)  Within seconds to minutes during the transfusion   Up to 24 hours after the transfusion Hives or red welts on the skin, mild itching, rash, localized swelling. flushing (red face), wheezing, shortness of breath, or stridor (high-pitched noise or sound)   Anaphylactic reaction  Within seconds to minutes during the transfusion   Up to 24 hours after the transfusion Shortness of breath, flushing (red face), wheezing, labored (working hard) breathing, low blood pressure, localized swelling, chest tightness, or cramps   Febrile nonhemolytic reaction  Within minutes to hours during the transfusion   Up to 24 hours after the transfusion Fever (increased of 1 C or higher), chills, flushing (red face), nausea, headache, minor discomfort, or mild shortness of breath   Acute immune hemolytic reaction  Within minutes during the transfusion   Up to 24 hours after the transfusion Fever, red or brown urine, back pain, fast heart rate (tachycardia), abdominal pain, low blood pressure, feeling anxious, chills, chest pain, nausea, or fainting spells   Transfusion-related acute lung injury (TRALI)  Within 1 to 2 hours during the transfusion   Up to 6 hours after the transfusion Shortness of breath, trouble breathing, low blood pressure, fever, pulmonary edema   Transfusion-associated circulatory overload  Near the end of the transfusion   Within 6 hours after the transfusion Shortness of breath, fast heart rate (tachycardia), problems breathing when lying on back, abnormal blood pressure   Post-transfusion purpura (PUP)  Within 1 week   Up to 48 days after the transfusion Purple spots on skin; nose bleed; bleeding from  theurinary tract, abdomen, colon, or rectum; fever; or chills   "Delayed" transfusion-related acute lung injury (TRALI)  Within 72 hours (3 days) after the transfusion "Sudden" onset of respiratory distress or trouble breathing   "Delayed" hemolytic reaction  Within 3 to 7 days   Up to weeks after the transfusion Low-grade fever, mild jaundice (yellowing of the skin and whites of the eyes), decrease in hematocrit, chills, chest pain, back pain, nausea      2000-2014 [employer], [address], [place] [address]. All rights reserved. This information is not intended as a substitute for professional medical care. Always follow your healthcare professional's instructions.

## 2013-03-16 ENCOUNTER — Encounter: Payer: Self-pay | Admitting: Vascular & Interventional Radiology

## 2013-03-17 ENCOUNTER — Ambulatory Visit
Admission: RE | Admit: 2013-03-17 | Discharge: 2013-03-17 | Disposition: A | Discharge status: Home / Self Care | Payer: PRIVATE HEALTH INSURANCE | Source: Ambulatory Visit | Attending: Hematology & Oncology | Admitting: Hematology & Oncology

## 2013-03-17 DIAGNOSIS — C9200 Acute myeloblastic leukemia, not having achieved remission: Secondary | ICD-10-CM | POA: Insufficient documentation

## 2013-03-17 LAB — APTT: PTT: 29 s (ref 23–37)

## 2013-03-17 LAB — PT/INR
PT INR: 0.9 (ref 0.9–1.1)
PT: 12.3 s — ABNORMAL LOW (ref 12.6–15.0)

## 2013-03-25 NOTE — Patient Instructions (Addendum)
Left voice message, asked patient to arrive to [facility] by 0800 for 0830 procedure. Informed patient that due to no sedation given, she is able to drive to procedure, ok to eat breakfast, take all morning medications. Call back number provided.

## 2013-03-26 ENCOUNTER — Ambulatory Visit: Payer: 59 | Admitting: Vascular & Interventional Radiology

## 2013-03-26 ENCOUNTER — Encounter
Admission: RE | Disposition: A | Discharge status: Home / Self Care | Payer: Self-pay | Source: Ambulatory Visit | Attending: Vascular & Interventional Radiology

## 2013-03-26 ENCOUNTER — Ambulatory Visit
Admission: RE | Admit: 2013-03-26 | Discharge: 2013-03-26 | Disposition: A | Discharge status: Home / Self Care | Payer: PRIVATE HEALTH INSURANCE | Source: Ambulatory Visit | Attending: Vascular & Interventional Radiology | Admitting: Vascular & Interventional Radiology

## 2013-03-26 ENCOUNTER — Encounter: Payer: Self-pay | Admitting: Vascular & Interventional Radiology

## 2013-03-26 DIAGNOSIS — Z452 Encounter for adjustment and management of vascular access device: Secondary | ICD-10-CM | POA: Insufficient documentation

## 2013-03-26 DIAGNOSIS — C9590 Leukemia, unspecified not having achieved remission: Secondary | ICD-10-CM

## 2013-03-26 DIAGNOSIS — C9200 Acute myeloblastic leukemia, not having achieved remission: Secondary | ICD-10-CM | POA: Insufficient documentation

## 2013-03-26 SURGERY — TUNNELED CATH REMOVAL

## 2013-03-26 MED ORDER — LIDOCAINE-EPINEPHRINE 1 %-1:100000 IJ SOLN
INTRAMUSCULAR | Status: DC
Start: 2013-03-26 — End: 2013-03-26
  Filled 2013-03-26: qty 20

## 2013-03-26 NOTE — Discharge Instructions (Signed)
[address]  [place], [place] [address]  (phone) [phone]   (fax) [phone]        Discharge instructions for tunneled catheter removal      Post procedure:  ? You may experience soreness at site for a day or two.  ? You may take Tylenol or other pain medications prescribed as needed for any discomfort  ? Keep dressing dry. Remove dressing in 48hrs. A new one should be placed over the site  ? You may shower after 48hrs   ? If site becomes wet, remove dressing, dry the site and replace a new dressing right away  ? Keep head elevated for 3 hours post procedure.    Diet and medications:  ? Resume your normal diet as tolerated  ? Resume regular medications    Routine site care:  ? If dressing becomes wet, soiled or loose, it should be changed immediately  ? Check site carefully for redness, tenderness, swelling, or drainage  ? Wash hands thoroughly before touching catheter site     Call [facility] if:  ? Fever greater then 101 within the first week post procedure  ? Redness, tenderness or warmth around the site  ? If there's active bleeding or drainage from the incision sites  ? Swelling in face, neck, extremity or lump under your skin at the site of your former access  ? Call 911 for neck swelling, shortness of breath or respitory distress  ?

## 2013-03-26 NOTE — Brief Op Note (Signed)
[facility]  [facility]   Brief Op Note       Physician(s): Hulda Marin, MD    Assistant(s):  Grayland Ormond Ly Bacchi, PA    Pre-operative Diagnosis: AML    Post-operative Diagnosis:  Same as preop diagnosis    Procedure(s) Performed: Tunneled central catheter removal                                                                                                                                                                                                                 Anesthesia:  1% lidocaine with epinephrine local    Complications: None    Estimated Blood Loss:  Minimal    Blood Administered:  None    Fluid Administered:  Per Nursing    Tubes and Drain(s): None    Explant(s):  Power line- dual lumen- groshong equivalent    Specimens: None    Findings:  Right IJ tunneled central line removal    Patient was transferred from the procedure room to the nursing unit in stable condition.  Full procedure note dictated separately in the PACS system.    Signed by: Roylene Reason, PA  [facility] Department  [facility] [phone]

## 2013-03-26 NOTE — Brief Op Note (Signed)
[facility]  Interventional Radiology  Attending Note     I was present and supervised the entire procedure.    Amijah Timothy, MD  Department of CardioVascular & Interventional Radiology  Vascular and Interventional Radiology  NeuroInterventional Radiology  [facility]: [phone]  [facility]: [phone]

## 2013-03-26 NOTE — Progress Notes (Signed)
Pt stable back in recovery area post tunneled cath removal, site remains cdi, soft without tenderness with palpation, vs stable 0/10 pain. Pt aware to keep HOB >45 degrees for atleast 3 hours post procedure. Discharge instructions reviewed with patient, all questions answered, out of unit stable with paperwork ambulatory.

## 2014-12-02 ENCOUNTER — Other Ambulatory Visit: Payer: Self-pay | Admitting: Physical Medicine & Rehabilitation

## 2014-12-02 ENCOUNTER — Other Ambulatory Visit: Payer: Self-pay | Admitting: Physician Assistant

## 2014-12-02 DIAGNOSIS — N952 Postmenopausal atrophic vaginitis: Secondary | ICD-10-CM

## 2014-12-02 DIAGNOSIS — N819 Female genital prolapse, unspecified: Secondary | ICD-10-CM

## 2014-12-02 DIAGNOSIS — R102 Pelvic and perineal pain: Secondary | ICD-10-CM

## 2014-12-12 ENCOUNTER — Other Ambulatory Visit: Payer: PRIVATE HEALTH INSURANCE

## 2014-12-15 ENCOUNTER — Ambulatory Visit: Payer: PRIVATE HEALTH INSURANCE

## 2014-12-19 ENCOUNTER — Other Ambulatory Visit: Payer: Self-pay | Admitting: Physician Assistant

## 2014-12-19 DIAGNOSIS — N952 Postmenopausal atrophic vaginitis: Secondary | ICD-10-CM

## 2014-12-19 DIAGNOSIS — N819 Female genital prolapse, unspecified: Secondary | ICD-10-CM

## 2014-12-19 DIAGNOSIS — G8929 Other chronic pain: Secondary | ICD-10-CM

## 2015-05-31 ENCOUNTER — Other Ambulatory Visit: Payer: Self-pay | Admitting: Obstetrics & Gynecology

## 2015-09-01 ENCOUNTER — Other Ambulatory Visit: Payer: Self-pay | Admitting: Internal Medicine

## 2015-09-01 DIAGNOSIS — K769 Liver disease, unspecified: Secondary | ICD-10-CM

## 2015-09-11 ENCOUNTER — Other Ambulatory Visit: Payer: Self-pay

## 2015-09-11 ENCOUNTER — Ambulatory Visit
Admission: RE | Admit: 2015-09-11 | Discharge: 2015-09-11 | Disposition: A | Discharge status: Home / Self Care | Payer: 59 | Source: Ambulatory Visit | Attending: Internal Medicine | Admitting: Internal Medicine

## 2015-09-11 DIAGNOSIS — K769 Liver disease, unspecified: Secondary | ICD-10-CM

## 2015-09-11 MED ORDER — GADOBENATE DIMEGLUMINE 529 MG/ML IV SOLN
10.0000 mL | Freq: Once | INTRAVENOUS | Status: AC | PRN
  Administered 2015-09-11: 10 mL via INTRAVENOUS

## 2016-01-23 ENCOUNTER — Other Ambulatory Visit: Payer: Self-pay | Admitting: Nurse Practitioner

## 2016-01-23 DIAGNOSIS — R102 Pelvic and perineal pain: Secondary | ICD-10-CM

## 2016-01-30 ENCOUNTER — Other Ambulatory Visit: Payer: 59

## 2016-01-30 ENCOUNTER — Ambulatory Visit
Admission: RE | Admit: 2016-01-30 | Discharge: 2016-01-30 | Disposition: A | Discharge status: Home / Self Care | Payer: BLUE CROSS/BLUE SHIELD | Source: Ambulatory Visit | Attending: Nurse Practitioner | Admitting: Nurse Practitioner

## 2016-01-30 DIAGNOSIS — R102 Pelvic and perineal pain: Secondary | ICD-10-CM

## 2016-03-07 ENCOUNTER — Other Ambulatory Visit (HOSPITAL_COMMUNITY): Payer: Self-pay | Admitting: Hematology and Oncology

## 2016-03-07 DIAGNOSIS — C9201 Acute myeloblastic leukemia, in remission: Secondary | ICD-10-CM

## 2016-03-12 ENCOUNTER — Other Ambulatory Visit: Payer: Self-pay | Admitting: Radiology

## 2016-03-13 ENCOUNTER — Other Ambulatory Visit: Payer: Self-pay | Admitting: Radiology

## 2016-03-14 ENCOUNTER — Ambulatory Visit (HOSPITAL_COMMUNITY): Payer: BLUE CROSS/BLUE SHIELD

## 2016-03-25 ENCOUNTER — Other Ambulatory Visit: Payer: Self-pay | Admitting: Physician Assistant

## 2016-03-26 ENCOUNTER — Other Ambulatory Visit (HOSPITAL_COMMUNITY): Payer: Self-pay | Admitting: Hematology and Oncology

## 2016-03-26 ENCOUNTER — Ambulatory Visit (HOSPITAL_COMMUNITY)
Admission: RE | Admit: 2016-03-26 | Discharge: 2016-03-26 | Disposition: A | Discharge status: Home / Self Care | Payer: BLUE CROSS/BLUE SHIELD | Source: Ambulatory Visit | Attending: Hematology and Oncology | Admitting: Hematology and Oncology

## 2016-03-26 ENCOUNTER — Encounter (HOSPITAL_COMMUNITY): Payer: Self-pay

## 2016-03-26 DIAGNOSIS — Z888 Allergy status to other drugs, medicaments and biological substances status: Secondary | ICD-10-CM | POA: Insufficient documentation

## 2016-03-26 DIAGNOSIS — Z885 Allergy status to narcotic agent status: Secondary | ICD-10-CM | POA: Insufficient documentation

## 2016-03-26 DIAGNOSIS — Z79899 Other long term (current) drug therapy: Secondary | ICD-10-CM | POA: Diagnosis not present

## 2016-03-26 DIAGNOSIS — D696 Thrombocytopenia, unspecified: Secondary | ICD-10-CM | POA: Diagnosis not present

## 2016-03-26 DIAGNOSIS — D7589 Other specified diseases of blood and blood-forming organs: Secondary | ICD-10-CM | POA: Diagnosis not present

## 2016-03-26 DIAGNOSIS — J45909 Unspecified asthma, uncomplicated: Secondary | ICD-10-CM | POA: Diagnosis not present

## 2016-03-26 DIAGNOSIS — C9201 Acute myeloblastic leukemia, in remission: Secondary | ICD-10-CM

## 2016-03-26 DIAGNOSIS — Z8249 Family history of ischemic heart disease and other diseases of the circulatory system: Secondary | ICD-10-CM | POA: Insufficient documentation

## 2016-03-26 LAB — CBC WITH DIFFERENTIAL/PLATELET
BASOS PCT: 0 %
Basophils Absolute: 0 10*3/uL (ref 0.0–0.1)
Eosinophils Absolute: 0 10*3/uL (ref 0.0–0.7)
Eosinophils Relative: 1 %
HEMATOCRIT: 35.5 % — AB (ref 36.0–46.0)
HEMOGLOBIN: 12.6 g/dL (ref 12.0–15.0)
LYMPHS PCT: 32 %
Lymphs Abs: 0.8 10*3/uL (ref 0.7–4.0)
MCH: 34.5 pg — ABNORMAL HIGH (ref 26.0–34.0)
MCHC: 35.5 g/dL (ref 30.0–36.0)
MCV: 97.3 fL (ref 78.0–100.0)
MONO ABS: 0.1 10*3/uL (ref 0.1–1.0)
MONOS PCT: 2 %
NEUTROS ABS: 1.7 10*3/uL (ref 1.7–7.7)
NEUTROS PCT: 65 %
Platelets: 165 10*3/uL (ref 150–400)
RBC: 3.65 MIL/uL — ABNORMAL LOW (ref 3.87–5.11)
RDW: 13.2 % (ref 11.5–15.5)
WBC: 2.6 10*3/uL — ABNORMAL LOW (ref 4.0–10.5)

## 2016-03-26 LAB — BASIC METABOLIC PANEL
ANION GAP: 5 (ref 5–15)
BUN: 17 mg/dL (ref 6–20)
CALCIUM: 9.9 mg/dL (ref 8.9–10.3)
CHLORIDE: 108 mmol/L (ref 101–111)
CO2: 27 mmol/L (ref 22–32)
Creatinine, Ser: 0.59 mg/dL (ref 0.44–1.00)
GFR calc non Af Amer: >60 mL/min (ref >60)
GLUCOSE: 91 mg/dL (ref 65–99)
POTASSIUM: 3.7 mmol/L (ref 3.5–5.1)
Sodium: 140 mmol/L (ref 135–145)

## 2016-03-26 LAB — PROTIME-INR
INR: 0.97
Prothrombin Time: 12.9 seconds (ref 11.4–15.2)

## 2016-03-26 MED ORDER — FLUMAZENIL 0.5 MG/5ML IV SOLN
INTRAVENOUS | Status: AC
  Filled 2016-03-26: qty 5

## 2016-03-26 MED ORDER — FENTANYL CITRATE (PF) 100 MCG/2ML IJ SOLN
INTRAMUSCULAR | Status: AC
  Filled 2016-03-26: qty 6

## 2016-03-26 MED ORDER — SODIUM CHLORIDE 0.9 % IV SOLN
INTRAVENOUS | Status: DC
  Administered 2016-03-26: 10:00:00 via INTRAVENOUS

## 2016-03-26 MED ORDER — NALOXONE HCL 0.4 MG/ML IJ SOLN
INTRAMUSCULAR | Status: DC
Start: 2016-03-26 — End: 2016-03-26
  Filled 2016-03-26: qty 1

## 2016-03-26 MED ORDER — FENTANYL CITRATE (PF) 100 MCG/2ML IJ SOLN
INTRAMUSCULAR | Status: AC | PRN
  Administered 2016-03-26: 25 ug via INTRAVENOUS
  Administered 2016-03-26: 50 ug via INTRAVENOUS

## 2016-03-26 MED ORDER — MIDAZOLAM HCL 2 MG/2ML IJ SOLN
INTRAMUSCULAR | Status: AC
  Filled 2016-03-26: qty 6

## 2016-03-26 MED ORDER — MIDAZOLAM HCL 2 MG/2ML IJ SOLN
INTRAMUSCULAR | Status: AC | PRN
  Administered 2016-03-26 (×2): 1 mg via INTRAVENOUS

## 2016-03-26 NOTE — Procedures (Signed)
Interventional Radiology Procedure Note  Procedure: CT guided aspirate and core biopsy of right iliac bone Complications: None Recommendations: - Bedrest supine x 1 hrs - Hydrocodone PRN  Pain - Follow biopsy results  Signed,  Heath K. McCullough, MD   

## 2016-03-26 NOTE — Consult Note (Signed)
Chief Complaint: Patient was seen in consultation today for CT-guided bone marrow biopsy  Referring Physician(s): Ava  Supervising Physician: Jacqulynn Cadet  Patient Status: [name] - Out-pt  History of Present Illness: Barbara Benitez is a 61 y.o. female with history of AML (diagnosed 2014), s/p treatment; now with worsening leukopenia, macrocytosis and thrombocytopenia. She presents today for CT-guided bone marrow biopsy for further evaluation.    Past Medical History:  Diagnosis Date  . Allergy   . Asthma   . Blood transfusion without reported diagnosis   . Cancer     Past Surgical History:  Procedure Laterality Date  . tendon Left    Hand    Allergies: Codeine; Percocet [oxycodone-acetaminophen]; and Vicodin [hydrocodone-acetaminophen]  Medications: Prior to Admission medications   Medication Sig Start Date End Date Taking? Authorizing Provider  allopurinol (ZYLOPRIM) 100 MG tablet Take 100 mg by mouth daily.    Historical Provider, MD  dexamethasone (DECADRON) 0.1 % ophthalmic solution every 3 (three) hours.    Historical Provider, MD  fluconazole (DIFLUCAN) 100 MG tablet Take 100 mg by mouth daily.    Historical Provider, MD  levofloxacin (LEVAQUIN) 500 MG tablet Take 500 mg by mouth daily.    Historical Provider, MD  predniSONE (DELTASONE) 10 MG tablet Take 3 a day for 3 days 2 a day for 3 days one a day for 3 days 01/09/13   Darlyne Russian, MD  pyridOXINE (VITAMIN B-6) 50 MG tablet Take 50 mg by mouth daily.    Historical Provider, MD  valACYclovir (VALTREX) 1000 MG tablet Take 1,000 mg by mouth 2 (two) times daily.    Historical Provider, MD     Family History  Problem Relation Age of Onset  . Hyperlipidemia Mother   . Hypertension Mother   . Heart disease Father     Social History   Social History  . Marital status: Single    Spouse name: N/A  . Number of children: N/A  . Years of education: N/A   Social History Main Topics  .  Smoking status: Never Smoker  . Smokeless tobacco: Not on file  . Alcohol use No  . Drug use: No  . Sexual activity: Not on file   Other Topics Concern  . Not on file   Social History Narrative  . No narrative on file      Review of Systems Currently denies fever, headache, chest pain, dyspnea, cough, abdominal/ back pain, nausea, vomiting or bleeding.  Vital Signs: BP 119/66 (BP Location: Right Arm)   Pulse 75   Temp 98.6 F (37 C) (Oral)   Resp 16   SpO2 99%   Physical Exam  Awake, alert. Chest clear to auscultation bilaterally. Heart with regular rate and rhythm. Abdomen soft, positive bowel sounds, nontender. Lower extremities with no edema. Mallampati Score:     Imaging: No results found.  Labs:  CBC: No results for input(s): WBC, HGB, HCT, PLT in the last 8760 hours.  COAGS: No results for input(s): INR, APTT in the last 8760 hours.  BMP: No results for input(s): NA, K, CL, CO2, GLUCOSE, BUN, CALCIUM, CREATININE, GFRNONAA, GFRAA in the last 8760 hours.  Invalid input(s): CMP  LIVER FUNCTION TESTS: No results for input(s): BILITOT, AST, ALT, ALKPHOS, PROT, ALBUMIN in the last 8760 hours.  TUMOR MARKERS: No results for input(s): AFPTM, CEA, CA199, CHROMGRNA in the last 8760 hours.  Assessment and Plan: 61 y.o. female with history of AML (diagnosed 2014), s/p treatment; now  with worsening leukopenia, macrocytosis and thrombocytopenia. She presents today for CT-guided bone marrow biopsy for further evaluation. Risks and benefits discussed with the patient/aunt including, but not limited to bleeding, infection, damage to adjacent structures or low yield requiring additional tests. All of the patient's questions were answered, patient is agreeable to proceed. Consent signed and in chart.       Thank you for this interesting consult.  I greatly enjoyed meeting [name] and look forward to participating in their care.  A copy of this report was sent to  the requesting provider on this date.  Electronically Signed: D. Rowe Robert 03/26/2016, 9:18 AM   I spent a total of  20 minutes   in face to face in clinical consultation, greater than 50% of which was counseling/coordinating care for CT-guided bone marrow biopsy

## 2016-03-26 NOTE — Discharge Instructions (Signed)
Bone Marrow Aspiration and Bone Marrow Biopsy, Adult, Care After °This sheet gives you information about how to care for yourself after your procedure. Your health care provider may also give you more specific instructions. If you have problems or questions, contact your health care provider. °What can I expect after the procedure? °After the procedure, it is common to have: °· Mild pain and tenderness. °· Swelling. °· Bruising. °Follow these instructions at home: °· Take over-the-counter or prescription medicines only as told by your health care provider. °· Do not take baths, swim, or use a hot tub until your health care provider approves. Ask if you can take a shower or have a sponge bath. °· Follow instructions from your health care provider about how to take care of the puncture site. Make sure you: °¨ Wash your hands with soap and water before you change your bandage (dressing). If soap and water are not available, use hand sanitizer. °¨ Change your dressing as told by your health care provider. °· Check your puncture site every day for signs of infection. Check for: °¨ More redness, swelling, or pain. °¨ More fluid or blood. °¨ Warmth. °¨ Pus or a bad smell. °· Return to your normal activities as told by your health care provider. Ask your health care provider what activities are safe for you. °· Do not drive for 24 hours if you were given a medicine to help you relax (sedative). °· Keep all follow-up visits as told by your health care provider. This is important. °Contact a health care provider if: °· You have more redness, swelling, or pain around the puncture site. °· You have more fluid or blood coming from the puncture site. °· Your puncture site feels warm to the touch. °· You have pus or a bad smell coming from the puncture site. °· You have a fever. °· Your pain is not controlled with medicine. °This information is not intended to replace advice given to you by your health care provider. Make sure you  discuss any questions you have with your health care provider. °Document Released: 07/20/2004 Document Revised: 07/21/2015 Document Reviewed: 06/14/2015 °Elsevier Interactive Patient Education © 2017 [employer] °Moderate Conscious Sedation, Adult, Care After °These instructions provide you with information about caring for yourself after your procedure. Your health care provider may also give you more specific instructions. Your treatment has been planned according to current medical practices, but problems sometimes occur. Call your health care provider if you have any problems or questions after your procedure. °What can I expect after the procedure? °After your procedure, it is common: °· To feel sleepy for several hours. °· To feel clumsy and have poor balance for several hours. °· To have poor judgment for several hours. °· To vomit if you eat too soon. °Follow these instructions at home: °For at least 24 hours after the procedure:  ° °· Do not: °¨ Participate in activities where you could fall or become injured. °¨ Drive. °¨ Use heavy machinery. °¨ Drink alcohol. °¨ Take sleeping pills or medicines that cause drowsiness. °¨ Make important decisions or sign legal documents. °¨ Take care of children on your own. °· Rest. °Eating and drinking  °· Follow the diet recommended by your health care provider. °· If you vomit: °¨ Drink water, juice, or soup when you can drink without vomiting. °¨ Make sure you have little or no nausea before eating solid foods. °General instructions  °· Have a responsible adult stay with you until you   until you are awake and alert.  Take over-the-counter and prescription medicines only as told by your health care provider.  If you smoke, do not smoke without supervision.  Keep all follow-up visits as told by your health care provider. This is important. Contact a health care provider if:  You keep feeling nauseous or you keep vomiting.  You feel light-headed.  You develop a  rash.  You have a fever. Get help right away if:  You have trouble breathing. This information is not intended to replace advice given to you by your health care provider. Make sure you discuss any questions you have with your health care provider. Document Released: 10/21/2012 Document Revised: 06/05/2015 Document Reviewed: 04/22/2015 Elsevier Interactive Patient Education  2017 [employer].

## 2016-04-03 LAB — CHROMOSOME ANALYSIS, BONE MARROW

## 2016-04-03 LAB — TISSUE HYBRIDIZATION (BONE MARROW)-NCBH

## 2016-04-08 ENCOUNTER — Encounter (HOSPITAL_COMMUNITY): Payer: Self-pay

## 2016-06-24 ENCOUNTER — Telehealth: Payer: Self-pay | Admitting: Oncology

## 2016-06-24 ENCOUNTER — Encounter: Payer: Self-pay | Admitting: Oncology

## 2016-06-24 ENCOUNTER — Telehealth: Payer: Self-pay | Admitting: *Deleted

## 2016-06-24 ENCOUNTER — Other Ambulatory Visit: Payer: Self-pay | Admitting: Oncology

## 2016-06-24 DIAGNOSIS — C9200 Acute myeloblastic leukemia, not having achieved remission: Secondary | ICD-10-CM | POA: Insufficient documentation

## 2016-06-24 NOTE — Telephone Encounter (Signed)
Message to schedulers for lab every Mon and Thurs. Office with Dr. Benay Spice 6/19 with labs.

## 2016-06-24 NOTE — Telephone Encounter (Signed)
Attempted to contact the pt to let her know appt has been rescheduled from 6/26 to 6/19 at 10am. Lft vm with appt information on the phone.

## 2016-06-24 NOTE — Telephone Encounter (Signed)
Call from [place], RN at [facility]: Pt received chemo over the weekend. Will need Neulasta on 6/12 and labs every Th and Tues. Appt given for Neulasta injection. Will review with MD for office visit appt.

## 2016-06-24 NOTE — Telephone Encounter (Signed)
Received a message from Dominica T to schedule an apprt for the pt to see Dr. Benay Spice on 6/26 at 2pm. Left a vm with the appt date and time on the pt's phone. Letter mailed.

## 2016-06-25 ENCOUNTER — Ambulatory Visit (HOSPITAL_BASED_OUTPATIENT_CLINIC_OR_DEPARTMENT_OTHER): Payer: BLUE CROSS/BLUE SHIELD

## 2016-06-25 ENCOUNTER — Telehealth: Payer: Self-pay | Admitting: Oncology

## 2016-06-25 ENCOUNTER — Other Ambulatory Visit: Payer: Self-pay | Admitting: *Deleted

## 2016-06-25 VITALS — BP 123/58 | HR 87 | Temp 98.2°F | Resp 20

## 2016-06-25 DIAGNOSIS — C9200 Acute myeloblastic leukemia, not having achieved remission: Secondary | ICD-10-CM

## 2016-06-25 MED ORDER — PEGFILGRASTIM INJECTION 6 MG/0.6ML ~~LOC~~
6.0000 mg | PREFILLED_SYRINGE | Freq: Once | SUBCUTANEOUS | Status: AC
  Administered 2016-06-25: 6 mg via SUBCUTANEOUS
  Filled 2016-06-25: qty 0.6

## 2016-06-25 NOTE — Telephone Encounter (Signed)
Peer-to-peer completed by Dr. Benay Spice. Neulasta authorized 6/12-9/20/18. #[id number].

## 2016-06-25 NOTE — Telephone Encounter (Signed)
Patient stopped by for 6/14 lab. Gave patient schedule for June and July. Lab appointments scheduled per 6/11 staff message.

## 2016-06-25 NOTE — Patient Instructions (Signed)
Pegfilgrastim injection What is this medicine? PEGFILGRASTIM (PEG fil gra stim) is a long-acting granulocyte colony-stimulating factor that stimulates the growth of neutrophils, a type of white blood cell important in the body's fight against infection. It is used to reduce the incidence of fever and infection in patients with certain types of cancer who are receiving chemotherapy that affects the bone marrow, and to increase survival after being exposed to high doses of radiation. This medicine may be used for other purposes; ask your health care provider or pharmacist if you have questions. COMMON BRAND NAME(S): Neulasta What should I tell my health care provider before I take this medicine? They need to know if you have any of these conditions: -kidney disease -latex allergy -ongoing radiation therapy -sickle cell disease -skin reactions to acrylic adhesives (On-Body Injector only) -an unusual or allergic reaction to pegfilgrastim, filgrastim, other medicines, foods, dyes, or preservatives -pregnant or trying to get pregnant -breast-feeding How should I use this medicine? This medicine is for injection under the skin. If you get this medicine at home, you will be taught how to prepare and give the pre-filled syringe or how to use the On-body Injector. Refer to the patient Instructions for Use for detailed instructions. Use exactly as directed. Tell your healthcare provider immediately if you suspect that the On-body Injector may not have performed as intended or if you suspect the use of the On-body Injector resulted in a missed or partial dose. It is important that you put your used needles and syringes in a special sharps container. Do not put them in a trash can. If you do not have a sharps container, call your pharmacist or healthcare provider to get one. Talk to your pediatrician regarding the use of this medicine in children. While this drug may be prescribed for selected conditions,  precautions do apply. Overdosage: If you think you have taken too much of this medicine contact a poison control center or emergency room at once. NOTE: This medicine is only for you. Do not share this medicine with others. What if I miss a dose? It is important not to miss your dose. Call your doctor or health care professional if you miss your dose. If you miss a dose due to an On-body Injector failure or leakage, a new dose should be administered as soon as possible using a single prefilled syringe for manual use. What may interact with this medicine? Interactions have not been studied. Give your health care provider a list of all the medicines, herbs, non-prescription drugs, or dietary supplements you use. Also tell them if you smoke, drink alcohol, or use illegal drugs. Some items may interact with your medicine. This list may not describe all possible interactions. Give your health care provider a list of all the medicines, herbs, non-prescription drugs, or dietary supplements you use. Also tell them if you smoke, drink alcohol, or use illegal drugs. Some items may interact with your medicine. What should I watch for while using this medicine? You may need blood work done while you are taking this medicine. If you are going to need a MRI, CT scan, or other procedure, tell your doctor that you are using this medicine (On-Body Injector only). What side effects may I notice from receiving this medicine? Side effects that you should report to your doctor or health care professional as soon as possible: -allergic reactions like skin rash, itching or hives, swelling of the face, lips, or tongue -dizziness -fever -pain, redness, or irritation at site   where injected -pinpoint red spots on the skin -red or dark-brown urine -shortness of breath or breathing problems -stomach or side pain, or pain at the shoulder -swelling -tiredness -trouble passing urine or change in the amount of urine Side  effects that usually do not require medical attention (report to your doctor or health care professional if they continue or are bothersome): -bone pain -muscle pain This list may not describe all possible side effects. Call your doctor for medical advice about side effects. You may report side effects to FDA at 1-800-FDA-1088. Where should I keep my medicine? Keep out of the reach of children. Store pre-filled syringes in a refrigerator between 2 and 8 degrees C (36 and 46 degrees F). Do not freeze. Keep in carton to protect from light. Throw away this medicine if it is left out of the refrigerator for more than 48 hours. Throw away any unused medicine after the expiration date. NOTE: This sheet is a summary. It may not cover all possible information. If you have questions about this medicine, talk to your doctor, pharmacist, or health care provider.  2018 Elsevier/Gold Standard (2015-12-28 12:58:03)  

## 2016-06-27 ENCOUNTER — Other Ambulatory Visit: Payer: BLUE CROSS/BLUE SHIELD

## 2016-06-27 ENCOUNTER — Telehealth: Payer: Self-pay | Admitting: Oncology

## 2016-06-27 ENCOUNTER — Telehealth: Payer: Self-pay | Admitting: *Deleted

## 2016-06-27 ENCOUNTER — Ambulatory Visit: Payer: BLUE CROSS/BLUE SHIELD

## 2016-06-27 ENCOUNTER — Ambulatory Visit (HOSPITAL_BASED_OUTPATIENT_CLINIC_OR_DEPARTMENT_OTHER): Payer: BLUE CROSS/BLUE SHIELD

## 2016-06-27 ENCOUNTER — Ambulatory Visit (HOSPITAL_COMMUNITY)
Admission: RE | Admit: 2016-06-27 | Discharge: 2016-06-27 | Disposition: A | Discharge status: Home / Self Care | Payer: BLUE CROSS/BLUE SHIELD | Source: Ambulatory Visit | Attending: Oncology | Admitting: Oncology

## 2016-06-27 ENCOUNTER — Other Ambulatory Visit: Payer: Self-pay | Admitting: *Deleted

## 2016-06-27 DIAGNOSIS — C9202 Acute myeloblastic leukemia, in relapse: Secondary | ICD-10-CM

## 2016-06-27 DIAGNOSIS — C9200 Acute myeloblastic leukemia, not having achieved remission: Secondary | ICD-10-CM

## 2016-06-27 LAB — CBC WITH DIFFERENTIAL/PLATELET
BASO%: 0.2 % (ref 0.0–2.0)
Basophils Absolute: 0 10*3/uL (ref 0.0–0.1)
EOS ABS: 0.1 10*3/uL (ref 0.0–0.5)
EOS%: 1.3 % (ref 0.0–7.0)
HCT: 25.2 % — ABNORMAL LOW (ref 34.8–46.6)
HGB: 8.2 g/dL — ABNORMAL LOW (ref 11.6–15.9)
LYMPH%: 2.4 % — AB (ref 14.0–49.7)
MCH: 32 pg (ref 25.1–34.0)
MCHC: 32.5 g/dL (ref 31.5–36.0)
MCV: 98.4 fL (ref 79.5–101.0)
MONO#: 0 10*3/uL — ABNORMAL LOW (ref 0.1–0.9)
MONO%: 0.3 % (ref 0.0–14.0)
NEUT#: 6 10*3/uL (ref 1.5–6.5)
NEUT%: 95.8 % — ABNORMAL HIGH (ref 38.4–76.8)
PLATELETS: 54 10*3/uL — AB (ref 145–400)
RBC: 2.56 10*6/uL — AB (ref 3.70–5.45)
RDW: 15.7 % — AB (ref 11.2–14.5)
WBC: 6.3 10*3/uL (ref 3.9–10.3)
lymph#: 0.2 10*3/uL — ABNORMAL LOW (ref 0.9–3.3)
nRBC: 0 % (ref 0–0)

## 2016-06-27 NOTE — Telephone Encounter (Signed)
Patient had a appointment but caregiver called and said that she was weak and felt faint like she was going to pass out.  I spoke with one of the triage RN's and she advised that the patient call her PCP or go to the ER.  I relayed the message to the caregiver and cancelled the lab appointment only

## 2016-06-27 NOTE — Telephone Encounter (Addendum)
Spoke with pt in lobby, she reports feeling weak with ambulation. According to [facility] parameters, pt will need 2 units irradiated PRBC.  Instructed her to keep blood band intact. Pt requested assistance taking bandage off port site. Reports it became stuck to the skin glue. Porsche, LPN assisted pt with removal of bandage. Per Porsche, ends were stuck to gauze and easily released. Will assess during transfusion appt on 6/15.

## 2016-06-28 ENCOUNTER — Ambulatory Visit (HOSPITAL_BASED_OUTPATIENT_CLINIC_OR_DEPARTMENT_OTHER): Payer: BLUE CROSS/BLUE SHIELD

## 2016-06-28 DIAGNOSIS — C9202 Acute myeloblastic leukemia, in relapse: Secondary | ICD-10-CM

## 2016-06-28 DIAGNOSIS — C9200 Acute myeloblastic leukemia, not having achieved remission: Secondary | ICD-10-CM

## 2016-06-28 LAB — ABO/RH: ABO/RH(D): O POS

## 2016-06-28 MED ORDER — SODIUM CHLORIDE 0.9% FLUSH
10.0000 mL | INTRAVENOUS | Status: AC | PRN
  Administered 2016-06-28: 10 mL
  Filled 2016-06-28: qty 10

## 2016-06-28 MED ORDER — HEPARIN SOD (PORK) LOCK FLUSH 100 UNIT/ML IV SOLN
500.0000 [IU] | Freq: Every day | INTRAVENOUS | Status: AC | PRN
  Administered 2016-06-28: 500 [IU]
  Filled 2016-06-28: qty 5

## 2016-06-28 MED ORDER — SODIUM CHLORIDE 0.9 % IV SOLN
250.0000 mL | Freq: Once | INTRAVENOUS | Status: AC
  Administered 2016-06-28: 250 mL via INTRAVENOUS

## 2016-06-28 NOTE — Patient Instructions (Signed)

## 2016-07-01 ENCOUNTER — Other Ambulatory Visit: Payer: BLUE CROSS/BLUE SHIELD

## 2016-07-01 LAB — TYPE AND SCREEN
ABO/RH(D): O POS
Antibody Screen: NEGATIVE
Unit division: 0
Unit division: 0

## 2016-07-01 LAB — BPAM RBC
Blood Product Expiration Date: 201807042359
Blood Product Expiration Date: 201807052359
ISSUE DATE / TIME: 201806151320
ISSUE DATE / TIME: 201806151320
UNIT TYPE AND RH: 5100
UNIT TYPE AND RH: 5100

## 2016-07-02 ENCOUNTER — Other Ambulatory Visit (HOSPITAL_BASED_OUTPATIENT_CLINIC_OR_DEPARTMENT_OTHER): Payer: BLUE CROSS/BLUE SHIELD

## 2016-07-02 ENCOUNTER — Telehealth: Payer: Self-pay | Admitting: Oncology

## 2016-07-02 ENCOUNTER — Ambulatory Visit (HOSPITAL_BASED_OUTPATIENT_CLINIC_OR_DEPARTMENT_OTHER): Payer: BLUE CROSS/BLUE SHIELD | Admitting: Oncology

## 2016-07-02 ENCOUNTER — Ambulatory Visit (HOSPITAL_BASED_OUTPATIENT_CLINIC_OR_DEPARTMENT_OTHER): Payer: BLUE CROSS/BLUE SHIELD

## 2016-07-02 VITALS — BP 115/49 | HR 88 | Temp 98.4°F | Resp 18 | Ht 65.75 in | Wt 115.2 lb

## 2016-07-02 DIAGNOSIS — C9200 Acute myeloblastic leukemia, not having achieved remission: Secondary | ICD-10-CM

## 2016-07-02 DIAGNOSIS — C9202 Acute myeloblastic leukemia, in relapse: Secondary | ICD-10-CM | POA: Diagnosis not present

## 2016-07-02 DIAGNOSIS — D696 Thrombocytopenia, unspecified: Secondary | ICD-10-CM | POA: Diagnosis not present

## 2016-07-02 DIAGNOSIS — D61810 Antineoplastic chemotherapy induced pancytopenia: Secondary | ICD-10-CM

## 2016-07-02 LAB — PLATELET COUNT: Platelets: 29 10*3/uL — ABNORMAL LOW (ref 145–400)

## 2016-07-02 LAB — CBC WITH DIFFERENTIAL/PLATELET
BASO%: 0 % (ref 0.0–2.0)
BASOS ABS: 0 10*3/uL (ref 0.0–0.1)
EOS%: 7.8 % — AB (ref 0.0–7.0)
Eosinophils Absolute: 0.2 10*3/uL (ref 0.0–0.5)
HCT: 33 % — ABNORMAL LOW (ref 34.8–46.6)
HGB: 11.1 g/dL — ABNORMAL LOW (ref 11.6–15.9)
LYMPH%: 15.9 % (ref 14.0–49.7)
MCH: 32 pg (ref 25.1–34.0)
MCHC: 33.6 g/dL (ref 31.5–36.0)
MCV: 95.1 fL (ref 79.5–101.0)
MONO#: 0.9 10*3/uL (ref 0.1–0.9)
MONO%: 35.1 % — AB (ref 0.0–14.0)
NEUT#: 1 10*3/uL — ABNORMAL LOW (ref 1.5–6.5)
NEUT%: 41.2 % (ref 38.4–76.8)
NRBC: 0 % (ref 0–0)
Platelets: 4 10*3/uL — CL (ref 145–400)
RBC: 3.47 10*6/uL — ABNORMAL LOW (ref 3.70–5.45)
RDW: 14.6 % — AB (ref 11.2–14.5)
WBC: 2.5 10*3/uL — AB (ref 3.9–10.3)
lymph#: 0.4 10*3/uL — ABNORMAL LOW (ref 0.9–3.3)

## 2016-07-02 LAB — COMPREHENSIVE METABOLIC PANEL
ALT: 13 U/L (ref 0–55)
AST: 13 U/L (ref 5–34)
Albumin: 3.6 g/dL (ref 3.5–5.0)
Alkaline Phosphatase: 106 U/L (ref 40–150)
Anion Gap: 7 mEq/L (ref 3–11)
BILIRUBIN TOTAL: 0.49 mg/dL (ref 0.20–1.20)
BUN: 20.4 mg/dL (ref 7.0–26.0)
CO2: 30 meq/L — AB (ref 22–29)
Calcium: 10.3 mg/dL (ref 8.4–10.4)
Chloride: 103 mEq/L (ref 98–109)
Creatinine: 0.7 mg/dL (ref 0.6–1.1)
EGFR: 86 mL/min/{1.73_m2} — AB (ref >90)
Glucose: 72 mg/dl (ref 70–140)
Potassium: 4.5 mEq/L (ref 3.5–5.1)
Sodium: 140 mEq/L (ref 136–145)
TOTAL PROTEIN: 6.2 g/dL — AB (ref 6.4–8.3)

## 2016-07-02 LAB — MAGNESIUM: Magnesium: 2 mg/dl (ref 1.5–2.5)

## 2016-07-02 MED ORDER — HEPARIN SOD (PORK) LOCK FLUSH 100 UNIT/ML IV SOLN
500.0000 [IU] | Freq: Every day | INTRAVENOUS | Status: AC | PRN
  Administered 2016-07-02: 500 [IU]
  Filled 2016-07-02: qty 5

## 2016-07-02 MED ORDER — SODIUM CHLORIDE 0.9% FLUSH
10.0000 mL | INTRAVENOUS | Status: AC | PRN
  Administered 2016-07-02: 10 mL
  Filled 2016-07-02: qty 10

## 2016-07-02 NOTE — Telephone Encounter (Signed)
lvm to inform pt of 6/21 appts per sch msg

## 2016-07-02 NOTE — Telephone Encounter (Signed)
Appointments scheduled per 07/02/16 los. Patient was given a copy of the AVS report and appointment scheudule, per 07/02/16 los.

## 2016-07-02 NOTE — Patient Instructions (Signed)
Thrombocytopenia Thrombocytopenia means that you have a low number of platelets in your blood. Platelets are tiny cells in the blood. When you bleed, they clump together at the cut or injury to stop the bleeding. This is called blood clotting. Not having enough platelets can cause bleeding problems. Follow these instructions at home: General instructions  Check your skin and inside your mouth for bruises or blood as told by your doctor.  Check to see if there is blood in your spit (sputum), pee (urine), and poop (stool). Do this as told by your doctor.  Ask your doctor if you can drink alcohol.  Take over-the-counter and prescription medicines only as told by your doctor.  Tell all of your doctors that you have this condition. Be sure to tell your dentist and eye doctor too. Activity  Do not do activities that can cause bumps or bruises until your doctor says it is okay.  Be careful not to cut yourself: ? When you shave. ? When you use scissors, needles, knives, or other tools.  Be careful not to burn yourself: ? When you use an iron. ? When you cook. Contact a doctor if:  You have bruises and you do not know why. Get help right away if:  You are bleeding anywhere on your body.  You have blood in your spit, pee, or poop. This information is not intended to replace advice given to you by your health care provider. Make sure you discuss any questions you have with your health care provider. Document Released: 12/20/2010 Document Revised: 09/03/2015 Document Reviewed: 07/04/2014 Elsevier Interactive Patient Education  2018 Elsevier Inc.   

## 2016-07-02 NOTE — Progress Notes (Signed)
[facility] Patient Consult   Referring MD: Loree Shehata 61 y.o.  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 She received induction chemotherapy with idarubicin and cytarabine beginning 07/06/2012. This was followed by 4 cycles of high-dose air is seek consolidation. A bone marrow biopsy in February 2015 confirmed remission with a normal karyotype.  She was diagnosed with recurrent leukemia on a bone marrow biopsy 03/26/2016. She had developed mild worsening cytopenias beginning in June 2017. The bone marrow biopsy showed recurrent AML with 20% blasts, FISH negative for -5,-7, and +8. FLT-3 negative, TET2 positive.  She was enrolled on a research study at [facility] and was treated with induction chemotherapy consisting of high dose Ara C/mitoxantrone plus CPI-613. A nadir bone marrow biopsy 04/30/2016 confirmed a hypocellular marrow with focal blast up to 10%. She underwent reinduction with course 2 of high-dose cytarabine/mitoxantrone plus CPI-613 05/02/2016. A nadir bone marrow biopsy on 05/15/2016 was negative.  She was omitted 06/21/2016 for cycle 1 consolidation with high-dose cytarabine/mitoxantrone plus CPI-613 with zinecard. She was discharged from the hospital 06/24/2016 and received Neulasta on 06/25/2016. She complains of malaise. No other complaint.    Past Medical History:  Diagnosis Date  . Allergy   . Asthma   . Blood transfusion without reported diagnosis   . AML-undifferentiated  June 2014     .  G0 P0  Past Surgical History:  Procedure Laterality Date  . tendon Left    Hand    Medications: Reviewed  Allergies:  Allergies  Allergen Reactions  . Amoxicillin Rash  . Codeine Nausea Only  . Morphine Nausea And Vomiting  . Percocet [Oxycodone-Acetaminophen] Nausea Only  . Vicodin [Hydrocodone-Acetaminophen]  Nausea Only  . Meropenem Rash    CAN NOT TOLERATE AT A FAST PACE CAUSED RASH    Family history: Her father died of metastatic colon cancer at age 25  Social History:   She lives alone in [place]. She worked as a Nurse, learning disability. She does not use cigarettes or alcohol. She has received transfusions. No risk factor for HIV or hepatitis.  ROS:   Positives include: Malaise, isolated fever on 06/25/2016, constipation-relieved with MiraLAX, indigestion  A complete ROS was otherwise negative.  Physical Exam:  Blood pressure (!) 115/49, pulse 88, temperature 98.4 F (36.9 C), temperature source Oral, resp. rate 18, height 5' 5.75" (1.67 m), weight 115 lb 3.2 oz (52.3 kg), SpO2 100 %.  HEENT: Oropharynx without thrush or bleeding. Small ulceration at the right buccal mucosa Lungs: Clear bilaterally, no respiratory distress Cardiac: Regular rate and rhythm Abdomen: No hepatosplenomegaly, nontender  Vascular: No leg edema Neurologic: Alert and oriented, she ambulates without difficulty Skin: Petechial rash at the lower leg bilaterally, no ecchymoses    LAB:  CBC  Lab Results  Component Value Date   WBC 2.5 (L) 07/02/2016   HGB 11.1 (L) 07/02/2016   HCT 33.0 (L) 07/02/2016   MCV 95.1 07/02/2016   PLT 4 (LL) 07/02/2016   NEUTROABS 1.0 (L) 07/02/2016        CMP     Component Value Date/Time   NA 140 07/02/2016 0907   K 4.5 07/02/2016 0907   CL 108 03/26/2016 0923   CO2 30 (H) 07/02/2016 0907   GLUCOSE 72 07/02/2016 0907   BUN 20.4 07/02/2016 0907   CREATININE 0.7 07/02/2016 8588  CALCIUM 10.3 07/02/2016 0907   PROT 6.2 (L) 07/02/2016 0907   ALBUMIN 3.6 07/02/2016 0907   AST 13 07/02/2016 0907   ALT 13 07/02/2016 0907   ALKPHOS 106 07/02/2016 0907   BILITOT 0.49 07/02/2016 0907   GFRNONAA >60 03/26/2016 0923   GFRAA >60 03/26/2016 0923        Assessment/Plan:   1. AML  Diagnosed June 2014, undifferentiated, normal karyotype  Induction  chemotherapy with cytarabine plus idarubicin, 7/3, beginning 07/06/2012  Consolidation with high-dose cytarabine for 4 cycles, last cycle 01/18/2013  End of treatment bone marrow biopsy 03/04/2013-remission with normal karyotype/FISH  Relapsed AML 03/26/2016-when he percent blasts on a bone marrow biopsy  Induction therapy with high-dose cytarabine/mitoxantrone plus CPI-613 on study at [facility] 04/27/2016  Nadir bone marrow biopsy with focal blast up to 10%, reinduction 05/02/2016  Cycle 1 consolidation therapy with high-dose cytarabine/mitoxantrone plus CPI-613 06/21/2016  2.   Pancytopenia secondary to chemotherapy    Disposition:   Ms. Bober is completing consolidation therapy after reinduction chemotherapy for treatment of recurrent AML. She is now at day 12 following cycle 1 consolidation. She has pancytopenia with severe thrombocytopenia. She will receive a platelet transfusion today. We will check a posttransfusion platelet count.  Ms. Rutan will call for a fever or bleeding. She continues antibiotic and antifungal prophylaxis. She will be scheduled for labs every Monday and Thursday. She will return for an office visit on 07/18/2016.  She being scheduled for a transplant evaluation at [facility].  50 minutes were spent with the patient today. The majority of the time was used for counseling and coordination of care.  Donneta Romberg, MD  07/02/2016, 10:47 AM

## 2016-07-02 NOTE — Progress Notes (Signed)
Spoke with Dr. Benay Spice regarding repeat platelet results. Advised patient ok to discharge home. Reviewed bleeding precautions with patient and encouraged her to go to closest ED if bleeding occurs. Verbalized understanding.

## 2016-07-03 LAB — BPAM PLATELET PHERESIS
Blood Product Expiration Date: 201806212359
ISSUE DATE / TIME: 201806191209
Unit Type and Rh: 6200

## 2016-07-03 LAB — PREPARE PLATELET PHERESIS: UNIT DIVISION: 0

## 2016-07-04 ENCOUNTER — Other Ambulatory Visit: Payer: Self-pay

## 2016-07-04 ENCOUNTER — Ambulatory Visit: Payer: BLUE CROSS/BLUE SHIELD

## 2016-07-04 ENCOUNTER — Telehealth: Payer: Self-pay | Admitting: Oncology

## 2016-07-04 ENCOUNTER — Other Ambulatory Visit (HOSPITAL_BASED_OUTPATIENT_CLINIC_OR_DEPARTMENT_OTHER): Payer: BLUE CROSS/BLUE SHIELD

## 2016-07-04 DIAGNOSIS — C9202 Acute myeloblastic leukemia, in relapse: Secondary | ICD-10-CM | POA: Diagnosis not present

## 2016-07-04 DIAGNOSIS — C9200 Acute myeloblastic leukemia, not having achieved remission: Secondary | ICD-10-CM

## 2016-07-04 LAB — CBC WITH DIFFERENTIAL/PLATELET
BASO%: 0.3 % (ref 0.0–2.0)
BASOS ABS: 0 10*3/uL (ref 0.0–0.1)
EOS%: 1.9 % (ref 0.0–7.0)
Eosinophils Absolute: 0.2 10*3/uL (ref 0.0–0.5)
HCT: 33.2 % — ABNORMAL LOW (ref 34.8–46.6)
HGB: 11.1 g/dL — ABNORMAL LOW (ref 11.6–15.9)
LYMPH%: 6.7 % — ABNORMAL LOW (ref 14.0–49.7)
MCH: 32.1 pg (ref 25.1–34.0)
MCHC: 33.5 g/dL (ref 31.5–36.0)
MCV: 95.9 fL (ref 79.5–101.0)
MONO#: 1.3 10*3/uL — ABNORMAL HIGH (ref 0.1–0.9)
MONO%: 13.1 % (ref 0.0–14.0)
NEUT#: 8 10*3/uL — ABNORMAL HIGH (ref 1.5–6.5)
NEUT%: 78 % — AB (ref 38.4–76.8)
Platelets: 22 10*3/uL — ABNORMAL LOW (ref 145–400)
RBC: 3.46 10*6/uL — ABNORMAL LOW (ref 3.70–5.45)
RDW: 15.1 % — ABNORMAL HIGH (ref 11.2–14.5)
WBC: 10.3 10*3/uL (ref 3.9–10.3)
lymph#: 0.7 10*3/uL — ABNORMAL LOW (ref 0.9–3.3)

## 2016-07-04 NOTE — Telephone Encounter (Signed)
Labs added for 07/05/16 per 07/04/16 sch msg.

## 2016-07-04 NOTE — Progress Notes (Signed)
Per [name] Section, RN, Dr. Benay Spice advised patient did not require platelet transfusion today. Barbara Benitez will notify patient.

## 2016-07-05 ENCOUNTER — Other Ambulatory Visit (HOSPITAL_BASED_OUTPATIENT_CLINIC_OR_DEPARTMENT_OTHER): Payer: BLUE CROSS/BLUE SHIELD

## 2016-07-05 DIAGNOSIS — C9200 Acute myeloblastic leukemia, not having achieved remission: Secondary | ICD-10-CM

## 2016-07-05 DIAGNOSIS — C9202 Acute myeloblastic leukemia, in relapse: Secondary | ICD-10-CM

## 2016-07-05 LAB — CBC WITH DIFFERENTIAL/PLATELET
BASO%: 0.1 % (ref 0.0–2.0)
Basophils Absolute: 0 10*3/uL (ref 0.0–0.1)
EOS%: 1.7 % (ref 0.0–7.0)
Eosinophils Absolute: 0.2 10*3/uL (ref 0.0–0.5)
HCT: 32.9 % — ABNORMAL LOW (ref 34.8–46.6)
HGB: 10.8 g/dL — ABNORMAL LOW (ref 11.6–15.9)
LYMPH%: 19.4 % (ref 14.0–49.7)
MCH: 32 pg (ref 25.1–34.0)
MCHC: 32.8 g/dL (ref 31.5–36.0)
MCV: 97.6 fL (ref 79.5–101.0)
MONO#: 0.8 10*3/uL (ref 0.1–0.9)
MONO%: 9.5 % (ref 0.0–14.0)
NEUT#: 6.1 10*3/uL (ref 1.5–6.5)
NEUT%: 69.3 % (ref 38.4–76.8)
PLATELETS: 22 10*3/uL — AB (ref 145–400)
RBC: 3.37 10*6/uL — AB (ref 3.70–5.45)
RDW: 14.8 % — ABNORMAL HIGH (ref 11.2–14.5)
WBC: 8.7 10*3/uL (ref 3.9–10.3)
lymph#: 1.7 10*3/uL (ref 0.9–3.3)

## 2016-07-05 NOTE — Progress Notes (Signed)
Per MD Sherrill no blood products needed today based on lab work. Patient notified in lobby. Patient verbalizes understanding to call for any bleeding. Patient is aware of appointment on 07/08/16. Patient has no further questions/concerns at this time.

## 2016-07-08 ENCOUNTER — Other Ambulatory Visit (HOSPITAL_BASED_OUTPATIENT_CLINIC_OR_DEPARTMENT_OTHER): Payer: BLUE CROSS/BLUE SHIELD

## 2016-07-08 DIAGNOSIS — C9200 Acute myeloblastic leukemia, not having achieved remission: Secondary | ICD-10-CM

## 2016-07-08 DIAGNOSIS — C9202 Acute myeloblastic leukemia, in relapse: Secondary | ICD-10-CM

## 2016-07-08 LAB — CBC WITH DIFFERENTIAL/PLATELET
BASO%: 0.2 % (ref 0.0–2.0)
Basophils Absolute: 0 10*3/uL (ref 0.0–0.1)
EOS ABS: 0.1 10*3/uL (ref 0.0–0.5)
EOS%: 1.8 % (ref 0.0–7.0)
HCT: 33.5 % — ABNORMAL LOW (ref 34.8–46.6)
HGB: 11 g/dL — ABNORMAL LOW (ref 11.6–15.9)
LYMPH%: 22 % (ref 14.0–49.7)
MCH: 31.9 pg (ref 25.1–34.0)
MCHC: 32.8 g/dL (ref 31.5–36.0)
MCV: 97.1 fL (ref 79.5–101.0)
MONO#: 0.4 10*3/uL (ref 0.1–0.9)
MONO%: 5.9 % (ref 0.0–14.0)
NEUT#: 4.4 10*3/uL (ref 1.5–6.5)
NEUT%: 70.1 % (ref 38.4–76.8)
Platelets: 88 10*3/uL — ABNORMAL LOW (ref 145–400)
RBC: 3.45 10*6/uL — AB (ref 3.70–5.45)
RDW: 14.7 % — ABNORMAL HIGH (ref 11.2–14.5)
WBC: 6.3 10*3/uL (ref 3.9–10.3)
lymph#: 1.4 10*3/uL (ref 0.9–3.3)

## 2016-07-08 LAB — COMPREHENSIVE METABOLIC PANEL
ALT: 13 U/L (ref 0–55)
ANION GAP: 7 meq/L (ref 3–11)
AST: 15 U/L (ref 5–34)
Albumin: 3.6 g/dL (ref 3.5–5.0)
Alkaline Phosphatase: 143 U/L (ref 40–150)
BILIRUBIN TOTAL: 0.32 mg/dL (ref 0.20–1.20)
BUN: 17.7 mg/dL (ref 7.0–26.0)
CHLORIDE: 105 meq/L (ref 98–109)
CO2: 29 meq/L (ref 22–29)
Calcium: 10 mg/dL (ref 8.4–10.4)
Creatinine: 0.8 mg/dL (ref 0.6–1.1)
EGFR: 83 mL/min/{1.73_m2} — AB (ref >90)
GLUCOSE: 79 mg/dL (ref 70–140)
Potassium: 4 mEq/L (ref 3.5–5.1)
SODIUM: 141 meq/L (ref 136–145)
Total Protein: 6.3 g/dL — ABNORMAL LOW (ref 6.4–8.3)

## 2016-07-08 LAB — MAGNESIUM: Magnesium: 2.1 mg/dl (ref 1.5–2.5)

## 2016-07-09 ENCOUNTER — Ambulatory Visit: Payer: BLUE CROSS/BLUE SHIELD | Admitting: Oncology

## 2016-07-11 ENCOUNTER — Telehealth: Payer: Self-pay | Admitting: Oncology

## 2016-07-11 ENCOUNTER — Other Ambulatory Visit (HOSPITAL_BASED_OUTPATIENT_CLINIC_OR_DEPARTMENT_OTHER): Payer: BLUE CROSS/BLUE SHIELD

## 2016-07-11 DIAGNOSIS — C9200 Acute myeloblastic leukemia, not having achieved remission: Secondary | ICD-10-CM

## 2016-07-11 DIAGNOSIS — C9202 Acute myeloblastic leukemia, in relapse: Secondary | ICD-10-CM | POA: Diagnosis not present

## 2016-07-11 LAB — CBC WITH DIFFERENTIAL/PLATELET
BASO%: 0.6 % (ref 0.0–2.0)
BASOS ABS: 0 10*3/uL (ref 0.0–0.1)
EOS%: 0.9 % (ref 0.0–7.0)
Eosinophils Absolute: 0 10*3/uL (ref 0.0–0.5)
HEMATOCRIT: 32.1 % — AB (ref 34.8–46.6)
HEMOGLOBIN: 10.9 g/dL — AB (ref 11.6–15.9)
LYMPH#: 0.5 10*3/uL — AB (ref 0.9–3.3)
LYMPH%: 10.4 % — ABNORMAL LOW (ref 14.0–49.7)
MCH: 32.8 pg (ref 25.1–34.0)
MCHC: 34.1 g/dL (ref 31.5–36.0)
MCV: 96.3 fL (ref 79.5–101.0)
MONO#: 0.9 10*3/uL (ref 0.1–0.9)
MONO%: 17.9 % — ABNORMAL HIGH (ref 0.0–14.0)
NEUT#: 3.5 10*3/uL (ref 1.5–6.5)
NEUT%: 70.2 % (ref 38.4–76.8)
Platelets: 185 10*3/uL (ref 145–400)
RBC: 3.34 10*6/uL — ABNORMAL LOW (ref 3.70–5.45)
RDW: 14.8 % — AB (ref 11.2–14.5)
WBC: 4.9 10*3/uL (ref 3.9–10.3)

## 2016-07-11 NOTE — Telephone Encounter (Signed)
rescheduled from 07/12 to 07/13 per patient request. Patient was given a copy of the updated appointment schedule.

## 2016-07-11 NOTE — Telephone Encounter (Signed)
Left a message on VM about appointment changes and a call back number

## 2016-07-15 ENCOUNTER — Other Ambulatory Visit (HOSPITAL_BASED_OUTPATIENT_CLINIC_OR_DEPARTMENT_OTHER): Payer: BLUE CROSS/BLUE SHIELD

## 2016-07-15 DIAGNOSIS — C9200 Acute myeloblastic leukemia, not having achieved remission: Secondary | ICD-10-CM

## 2016-07-15 DIAGNOSIS — C9202 Acute myeloblastic leukemia, in relapse: Secondary | ICD-10-CM

## 2016-07-15 LAB — CBC WITH DIFFERENTIAL/PLATELET
BASO%: 0.5 % (ref 0.0–2.0)
Basophils Absolute: 0 10*3/uL (ref 0.0–0.1)
EOS%: 0.8 % (ref 0.0–7.0)
Eosinophils Absolute: 0 10*3/uL (ref 0.0–0.5)
HEMATOCRIT: 32.2 % — AB (ref 34.8–46.6)
HEMOGLOBIN: 10.9 g/dL — AB (ref 11.6–15.9)
LYMPH#: 0.3 10*3/uL — AB (ref 0.9–3.3)
LYMPH%: 11.2 % — ABNORMAL LOW (ref 14.0–49.7)
MCH: 32.9 pg (ref 25.1–34.0)
MCHC: 34 g/dL (ref 31.5–36.0)
MCV: 96.8 fL (ref 79.5–101.0)
MONO#: 0.6 10*3/uL (ref 0.1–0.9)
MONO%: 19 % — ABNORMAL HIGH (ref 0.0–14.0)
NEUT%: 68.5 % (ref 38.4–76.8)
NEUTROS ABS: 2.1 10*3/uL (ref 1.5–6.5)
PLATELETS: 215 10*3/uL (ref 145–400)
RBC: 3.32 10*6/uL — ABNORMAL LOW (ref 3.70–5.45)
RDW: 15.4 % — AB (ref 11.2–14.5)
WBC: 3.1 10*3/uL — ABNORMAL LOW (ref 3.9–10.3)

## 2016-07-15 LAB — COMPREHENSIVE METABOLIC PANEL
ALBUMIN: 3.6 g/dL (ref 3.5–5.0)
ALT: 14 U/L (ref 0–55)
ANION GAP: 7 meq/L (ref 3–11)
AST: 16 U/L (ref 5–34)
Alkaline Phosphatase: 108 U/L (ref 40–150)
BILIRUBIN TOTAL: 0.3 mg/dL (ref 0.20–1.20)
BUN: 15.6 mg/dL (ref 7.0–26.0)
CALCIUM: 10.1 mg/dL (ref 8.4–10.4)
CO2: 29 mEq/L (ref 22–29)
CREATININE: 0.7 mg/dL (ref 0.6–1.1)
Chloride: 105 mEq/L (ref 98–109)
EGFR: 86 mL/min/{1.73_m2} — ABNORMAL LOW (ref >90)
Glucose: 87 mg/dl (ref 70–140)
Potassium: 4 mEq/L (ref 3.5–5.1)
Sodium: 141 mEq/L (ref 136–145)
TOTAL PROTEIN: 6.2 g/dL — AB (ref 6.4–8.3)

## 2016-07-15 LAB — MAGNESIUM: Magnesium: 2 mg/dl (ref 1.5–2.5)

## 2016-07-15 NOTE — Progress Notes (Signed)
Spoke to pt in the lobby. Informed pt that that per MD Benay Spice is satisfied with results of lab work today and that she can go home. Pt  Informed RN that she felt she was getting a sinus infection and that she plans to make an appt with her PCP. Pt reports no fever or visible drainage. Informed pt that making appt with PCP is ok. MD aware of pt's reported symptoms. No new orders at this time

## 2016-07-18 ENCOUNTER — Other Ambulatory Visit (HOSPITAL_BASED_OUTPATIENT_CLINIC_OR_DEPARTMENT_OTHER): Payer: BLUE CROSS/BLUE SHIELD

## 2016-07-18 ENCOUNTER — Ambulatory Visit: Payer: BLUE CROSS/BLUE SHIELD | Admitting: Oncology

## 2016-07-18 DIAGNOSIS — C9200 Acute myeloblastic leukemia, not having achieved remission: Secondary | ICD-10-CM

## 2016-07-18 DIAGNOSIS — C9202 Acute myeloblastic leukemia, in relapse: Secondary | ICD-10-CM

## 2016-07-18 LAB — CBC WITH DIFFERENTIAL/PLATELET
BASO%: 0.3 % (ref 0.0–2.0)
BASOS ABS: 0 10*3/uL (ref 0.0–0.1)
EOS ABS: 0.1 10*3/uL (ref 0.0–0.5)
EOS%: 1.6 % (ref 0.0–7.0)
HEMATOCRIT: 30.9 % — AB (ref 34.8–46.6)
HEMOGLOBIN: 10.3 g/dL — AB (ref 11.6–15.9)
LYMPH#: 0.8 10*3/uL — AB (ref 0.9–3.3)
LYMPH%: 27 % (ref 14.0–49.7)
MCH: 33 pg (ref 25.1–34.0)
MCHC: 33.3 g/dL (ref 31.5–36.0)
MCV: 99 fL (ref 79.5–101.0)
MONO#: 0.3 10*3/uL (ref 0.1–0.9)
MONO%: 9.2 % (ref 0.0–14.0)
NEUT#: 1.9 10*3/uL (ref 1.5–6.5)
NEUT%: 61.9 % (ref 38.4–76.8)
Platelets: 170 10*3/uL (ref 145–400)
RBC: 3.12 10*6/uL — ABNORMAL LOW (ref 3.70–5.45)
RDW: 16.5 % — AB (ref 11.2–14.5)
WBC: 3 10*3/uL — ABNORMAL LOW (ref 3.9–10.3)

## 2016-07-22 ENCOUNTER — Other Ambulatory Visit (HOSPITAL_BASED_OUTPATIENT_CLINIC_OR_DEPARTMENT_OTHER): Payer: BLUE CROSS/BLUE SHIELD

## 2016-07-22 DIAGNOSIS — C9202 Acute myeloblastic leukemia, in relapse: Secondary | ICD-10-CM

## 2016-07-22 DIAGNOSIS — C9200 Acute myeloblastic leukemia, not having achieved remission: Secondary | ICD-10-CM

## 2016-07-22 LAB — COMPREHENSIVE METABOLIC PANEL
ALT: 16 U/L (ref 0–55)
AST: 18 U/L (ref 5–34)
Albumin: 3.7 g/dL (ref 3.5–5.0)
Alkaline Phosphatase: 98 U/L (ref 40–150)
Anion Gap: 5 mEq/L (ref 3–11)
BUN: 18.8 mg/dL (ref 7.0–26.0)
CALCIUM: 10 mg/dL (ref 8.4–10.4)
CO2: 28 mEq/L (ref 22–29)
CREATININE: 0.7 mg/dL (ref 0.6–1.1)
Chloride: 105 mEq/L (ref 98–109)
EGFR: 89 mL/min/{1.73_m2} — ABNORMAL LOW (ref >90)
GLUCOSE: 76 mg/dL (ref 70–140)
Potassium: 4.3 mEq/L (ref 3.5–5.1)
Sodium: 138 mEq/L (ref 136–145)
TOTAL PROTEIN: 6.5 g/dL (ref 6.4–8.3)
Total Bilirubin: 0.33 mg/dL (ref 0.20–1.20)

## 2016-07-22 LAB — CBC WITH DIFFERENTIAL/PLATELET
BASO%: 0.3 % (ref 0.0–2.0)
BASOS ABS: 0 10*3/uL (ref 0.0–0.1)
EOS%: 0.8 % (ref 0.0–7.0)
Eosinophils Absolute: 0 10*3/uL (ref 0.0–0.5)
HEMATOCRIT: 32.7 % — AB (ref 34.8–46.6)
HEMOGLOBIN: 10.9 g/dL — AB (ref 11.6–15.9)
LYMPH#: 0.8 10*3/uL — AB (ref 0.9–3.3)
LYMPH%: 21.4 % (ref 14.0–49.7)
MCH: 33.3 pg (ref 25.1–34.0)
MCHC: 33.3 g/dL (ref 31.5–36.0)
MCV: 100 fL (ref 79.5–101.0)
MONO#: 0.1 10*3/uL (ref 0.1–0.9)
MONO%: 3.1 % (ref 0.0–14.0)
NEUT%: 74.4 % (ref 38.4–76.8)
NEUTROS ABS: 2.7 10*3/uL (ref 1.5–6.5)
Platelets: 146 10*3/uL (ref 145–400)
RBC: 3.27 10*6/uL — ABNORMAL LOW (ref 3.70–5.45)
RDW: 17.5 % — AB (ref 11.2–14.5)
WBC: 3.6 10*3/uL — AB (ref 3.9–10.3)

## 2016-07-22 LAB — MAGNESIUM: MAGNESIUM: 2.2 mg/dL (ref 1.5–2.5)

## 2016-07-23 ENCOUNTER — Ambulatory Visit (HOSPITAL_BASED_OUTPATIENT_CLINIC_OR_DEPARTMENT_OTHER): Payer: BLUE CROSS/BLUE SHIELD | Admitting: Nurse Practitioner

## 2016-07-23 ENCOUNTER — Telehealth: Payer: Self-pay | Admitting: Nurse Practitioner

## 2016-07-23 VITALS — BP 102/65 | HR 90 | Temp 98.5°F | Resp 18 | Ht 65.75 in | Wt 118.4 lb

## 2016-07-23 DIAGNOSIS — R1032 Left lower quadrant pain: Secondary | ICD-10-CM | POA: Diagnosis not present

## 2016-07-23 DIAGNOSIS — C9202 Acute myeloblastic leukemia, in relapse: Secondary | ICD-10-CM | POA: Diagnosis not present

## 2016-07-23 DIAGNOSIS — R21 Rash and other nonspecific skin eruption: Secondary | ICD-10-CM | POA: Diagnosis not present

## 2016-07-23 DIAGNOSIS — R10814 Left lower quadrant abdominal tenderness: Secondary | ICD-10-CM

## 2016-07-23 NOTE — Telephone Encounter (Signed)
Scheduled appt per 7/10 los - Gave patient AVS and calender per los.  

## 2016-07-23 NOTE — Progress Notes (Addendum)
  Barbara Benite[facility] OFFICE PROGRESS NOTE   Diagnosis:  AML  INTERVAL HISTORY:   Barbara Benitez returns as scheduled. She feels well. No bleeding. No fever, chills or sweats. She has a good appetite. Energy level is improving. She is walking in her neighborhood. No shortness of breath. Over the past week she has had occasional pain at the left lower abdomen. She reports having a similar discomfort about a year ago and was evaluated by her gynecologist. Transabdominal and transvaginal pelvic ultrasound 01/30/2016 showed uterine fibroids. She denies diarrhea. No blood with bowel movements.  Objective:  Vital signs in last 24 hours:  Blood pressure 102/65, pulse 90, temperature 98.5 F (36.9 C), temperature source Oral, resp. rate 18, height 5' 5.75" (1.67 m), weight 118 lb 6.4 oz (53.7 kg), SpO2 100 %.    HEENT: No thrush or ulcers. Resp: Lungs clear bilaterally. Cardio: Regular rate and rhythm. GI: Abdomen is soft. No organomegaly. No mass. Mild tenderness at the left lower abdomen. No guarding, rebound or rigidity. Vascular: No leg edema. Calves soft and nontender.  Skin: Well-demarcated flat purplish red skin discoloration at the upper posterior neck extending to the occipital region.  Port-A-Cath without erythema.  Lab Results:  Lab Results  Component Value Date   WBC 3.6 (L) 07/22/2016   HGB 10.9 (L) 07/22/2016   HCT 32.7 (L) 07/22/2016   MCV 100.0 07/22/2016   PLT 146 07/22/2016   NEUTROABS 2.7 07/22/2016    Imaging:  No results found.  Medications: I have reviewed the patient's current medications.  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 Induction chemotherapy with cytarabine plus idarubicin, 7/3, beginning 07/06/2012 ? Consolidation with high-dose cytarabine for 4 cycles, last cycle 01/18/2013 ? End of treatment bone marrow biopsy 03/04/2013-remission with normal karyotype/FISH ? Relapsed AML 03/26/2016-when he  percent blasts on a bone marrow biopsy ? Induction therapy with high-dose cytarabine/mitoxantrone plus CPI-613 on study at [facility] 04/27/2016 ? Nadir bone marrow biopsy with focal blast up to 10%, reinduction 05/02/2016 ? Cycle 1 consolidation therapy with high-dose cytarabine/mitoxantrone plus CPI-613 06/21/2016  2.   Pancytopenia secondary to chemotherapy, Improved.   Disposition: Barbara Benitez appears stable. Blood counts from yesterday stable. She is scheduled to return to [facility] for admission for cycle 2 consolidation chemotherapy 08/02/2016. We will tentatively schedule a Neulasta injection here 08/06/2016. She will begin twice weekly labs on 08/08/2016. We will see her in follow-up on 08/19/2016.  She has mild pain/tenderness at the left lower abdomen. She will contact the office if this worsens or she develops any other symptoms such as diarrhea, fever.  The rash at the posterior scalp/neck may be a birthmark. She will call with any changes.  Patient seen with Dr. Benay Spice.    Ned Card ANP/GNP-BC   07/23/2016  2:48 PM This was a shared visit with Ned Card. Barbara Benitez was interviewed and examined. The white count and platelets have recovered following the first course of consolidation therapy. She will be admitted for cycle 2 consolidation next week.  Julieanne Manson, M.D.

## 2016-07-25 ENCOUNTER — Other Ambulatory Visit: Payer: BLUE CROSS/BLUE SHIELD

## 2016-07-26 ENCOUNTER — Other Ambulatory Visit: Payer: BLUE CROSS/BLUE SHIELD

## 2016-07-29 ENCOUNTER — Other Ambulatory Visit: Payer: BLUE CROSS/BLUE SHIELD

## 2016-08-01 ENCOUNTER — Other Ambulatory Visit: Payer: BLUE CROSS/BLUE SHIELD

## 2016-08-06 ENCOUNTER — Telehealth: Payer: Self-pay | Admitting: Oncology

## 2016-08-06 ENCOUNTER — Ambulatory Visit: Payer: BLUE CROSS/BLUE SHIELD

## 2016-08-06 NOTE — Telephone Encounter (Signed)
Faxed labs to [facility]

## 2016-08-08 ENCOUNTER — Other Ambulatory Visit: Payer: BLUE CROSS/BLUE SHIELD

## 2016-08-08 ENCOUNTER — Ambulatory Visit: Payer: BLUE CROSS/BLUE SHIELD

## 2016-08-09 ENCOUNTER — Other Ambulatory Visit: Payer: Self-pay | Admitting: *Deleted

## 2016-08-09 ENCOUNTER — Telehealth: Payer: Self-pay | Admitting: Oncology

## 2016-08-09 NOTE — Telephone Encounter (Signed)
RN called from [facility] to let us know that Barbara Benitez will be at her next lab on 7/30 but doesn't need a neulasta injection.  She was givin that at [facility] while in the hospital

## 2016-08-12 ENCOUNTER — Telehealth: Payer: Self-pay

## 2016-08-12 ENCOUNTER — Other Ambulatory Visit: Payer: Self-pay

## 2016-08-12 ENCOUNTER — Ambulatory Visit (HOSPITAL_BASED_OUTPATIENT_CLINIC_OR_DEPARTMENT_OTHER): Payer: BLUE CROSS/BLUE SHIELD

## 2016-08-12 ENCOUNTER — Ambulatory Visit (HOSPITAL_BASED_OUTPATIENT_CLINIC_OR_DEPARTMENT_OTHER): Payer: BLUE CROSS/BLUE SHIELD | Admitting: Oncology

## 2016-08-12 ENCOUNTER — Ambulatory Visit: Payer: BLUE CROSS/BLUE SHIELD

## 2016-08-12 ENCOUNTER — Other Ambulatory Visit (HOSPITAL_BASED_OUTPATIENT_CLINIC_OR_DEPARTMENT_OTHER): Payer: BLUE CROSS/BLUE SHIELD

## 2016-08-12 ENCOUNTER — Ambulatory Visit (HOSPITAL_COMMUNITY)
Admission: RE | Admit: 2016-08-12 | Discharge: 2016-08-12 | Disposition: A | Discharge status: Home / Self Care | Payer: BLUE CROSS/BLUE SHIELD | Source: Ambulatory Visit | Attending: Oncology | Admitting: Oncology

## 2016-08-12 DIAGNOSIS — C9202 Acute myeloblastic leukemia, in relapse: Secondary | ICD-10-CM

## 2016-08-12 DIAGNOSIS — D709 Neutropenia, unspecified: Secondary | ICD-10-CM

## 2016-08-12 DIAGNOSIS — D696 Thrombocytopenia, unspecified: Secondary | ICD-10-CM | POA: Diagnosis not present

## 2016-08-12 DIAGNOSIS — C9200 Acute myeloblastic leukemia, not having achieved remission: Secondary | ICD-10-CM

## 2016-08-12 DIAGNOSIS — D61810 Antineoplastic chemotherapy induced pancytopenia: Secondary | ICD-10-CM | POA: Diagnosis not present

## 2016-08-12 LAB — COMPREHENSIVE METABOLIC PANEL
ALT: 36 U/L (ref 0–55)
AST: 23 U/L (ref 5–34)
Albumin: 3.6 g/dL (ref 3.5–5.0)
Alkaline Phosphatase: 106 U/L (ref 40–150)
Anion Gap: 7 mEq/L (ref 3–11)
BUN: 16.6 mg/dL (ref 7.0–26.0)
CO2: 29 mEq/L (ref 22–29)
Calcium: 10.1 mg/dL (ref 8.4–10.4)
Chloride: 105 mEq/L (ref 98–109)
Creatinine: 0.7 mg/dL (ref 0.6–1.1)
EGFR: 89 mL/min/{1.73_m2} — ABNORMAL LOW (ref >90)
Glucose: 88 mg/dl (ref 70–140)
Potassium: 3.6 mEq/L (ref 3.5–5.1)
Sodium: 141 mEq/L (ref 136–145)
Total Bilirubin: 0.59 mg/dL (ref 0.20–1.20)
Total Protein: 6 g/dL — ABNORMAL LOW (ref 6.4–8.3)

## 2016-08-12 LAB — CBC WITH DIFFERENTIAL/PLATELET
BASO%: 0 % (ref 0.0–2.0)
Basophils Absolute: 0 10*3/uL (ref 0.0–0.1)
EOS%: 12.8 % — ABNORMAL HIGH (ref 0.0–7.0)
Eosinophils Absolute: 0.1 10*3/uL (ref 0.0–0.5)
HCT: 29.2 % — ABNORMAL LOW (ref 34.8–46.6)
HGB: 9.9 g/dL — ABNORMAL LOW (ref 11.6–15.9)
LYMPH%: 55.1 % — ABNORMAL HIGH (ref 14.0–49.7)
MCH: 33.6 pg (ref 25.1–34.0)
MCHC: 33.9 g/dL (ref 31.5–36.0)
MCV: 99 fL (ref 79.5–101.0)
MONO#: 0.1 10*3/uL (ref 0.1–0.9)
MONO%: 9 % (ref 0.0–14.0)
NEUT#: 0.2 10*3/uL — CL (ref 1.5–6.5)
NEUT%: 23.1 % — ABNORMAL LOW (ref 38.4–76.8)
Platelets: 7 10*3/uL — CL (ref 145–400)
RBC: 2.95 10*6/uL — ABNORMAL LOW (ref 3.70–5.45)
RDW: 14.8 % — ABNORMAL HIGH (ref 11.2–14.5)
WBC: 0.8 10*3/uL — CL (ref 3.9–10.3)
lymph#: 0.4 10*3/uL — ABNORMAL LOW (ref 0.9–3.3)

## 2016-08-12 LAB — MAGNESIUM: Magnesium: 2 mg/dl (ref 1.5–2.5)

## 2016-08-12 MED ORDER — SODIUM CHLORIDE 0.9% FLUSH
10.0000 mL | INTRAVENOUS | Status: AC | PRN
  Administered 2016-08-12: 10 mL
  Filled 2016-08-12: qty 10

## 2016-08-12 MED ORDER — SODIUM CHLORIDE 0.9 % IV SOLN
250.0000 mL | Freq: Once | INTRAVENOUS | Status: AC
  Administered 2016-08-12: 250 mL via INTRAVENOUS

## 2016-08-12 MED ORDER — HEPARIN SOD (PORK) LOCK FLUSH 100 UNIT/ML IV SOLN
500.0000 [IU] | Freq: Every day | INTRAVENOUS | Status: AC | PRN
  Administered 2016-08-12: 500 [IU]
  Filled 2016-08-12: qty 5

## 2016-08-12 NOTE — Progress Notes (Signed)
  [name] OFFICE PROGRESS NOTE   Diagnosis: AML  INTERVAL HISTORY:   [name] returns prior to a scheduled visit. She was admitted 08/02/2016 for cycle 2 of consolidation chemotherapy with high-dose cytarabine, mitoxantrone, and CPI-613. Chemotherapy started 08/02/2016 and was completed on 08/04/2016. She received zinecard as a cardioprotectant.  The postchemotherapy course was complicated by nausea requiring postchemotherapy anti-emetics. She had a fever in the hospital and was treated with empiric cefepime. She was discharged to home 08/10/2016 on Levaquin, Diflucan, and acyclovir prophylaxis.  She denies fever and bleeding. She presents today for a nadir CBC. She says she received G-CSF while in the hospital.  Objective:  Vital signs in last 24 hours:  There were no vitals taken for this visit.    HEENT: No thrush, ulcers, or bleeding Resp: Lungs clear bilaterally Cardio: Regular rate and rhythm GI: No hepatosplenomegaly, nontender Vascular: No leg edema  Skin: Few petechiae surrounding the Port-A-Cath site. No other petechiae or ecchymoses.   Portacath/PICC-without erythema  Lab Results:  Lab Results  Component Value Date   WBC 0.8 (LL) 08/12/2016   HGB 9.9 (L) 08/12/2016   HCT 29.2 (L) 08/12/2016   MCV 99.0 08/12/2016   PLT 7 (LL) 08/12/2016   NEUTROABS 0.2 (LL) 08/12/2016    CMP     Component Value Date/Time   NA 141 08/12/2016 1124   K 3.6 08/12/2016 1124   CL 108 03/26/2016 0923   CO2 29 08/12/2016 1124   GLUCOSE 88 08/12/2016 1124   BUN 16.6 08/12/2016 1124   CREATININE 0.7 08/12/2016 1124   CALCIUM 10.1 08/12/2016 1124   PROT 6.0 (L) 08/12/2016 1124   ALBUMIN 3.6 08/12/2016 1124   AST 23 08/12/2016 1124   ALT 36 08/12/2016 1124   ALKPHOS 106 08/12/2016 1124   BILITOT 0.59 08/12/2016 1124   GFRNONAA >60 03/26/2016 0923   GFRAA >60 03/26/2016 0923     Medications: I have reviewed the patient's current  medications.  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 Induction chemotherapy with cytarabine plus idarubicin, 7/3, beginning 07/06/2012 ? Consolidation with high-dose cytarabine for 4 cycles, last cycle 01/18/2013 ? End of treatment bone marrow biopsy 03/04/2013-remission with normal karyotype/FISH ? 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 Induction therapy with high-dose cytarabine/mitoxantrone plus CPI-613on study at [facility] 04/27/2016 ? Nadir bone marrow biopsy with focal blast up to 10%, reinduction 05/02/2016 ? Cycle 1 consolidation therapy with high-dose cytarabine/mitoxantrone plus CPI-613 06/21/2016 ? Cycle 2 consolidation with high-dose cytarabine, mitoxantrone, and CPI-613 08/02/2016  2. Pancytopenia secondary to chemotherapy    Disposition:  Barbara Benitez is now at day 83 following consolidation chemotherapy. She has severe neutropenia and thrombocytopenia. She will contact us for a fever. She continues Levaquin prophylaxis. She will receive a platelet transfusion today. We will check a posttransfusion platelet count to be sure she has an adequate increment.  We will contact [facility] to discuss the indication for Neulasta. She reports receiving G-CSF while in the hospital last week. She will return for an office visit as scheduled 08/19/2016. 25 minutes were spent with the patient today. The majority of the time was used for counseling and coordination of care.  Donneta Romberg, MD  08/12/2016  4:38 PM

## 2016-08-12 NOTE — Telephone Encounter (Signed)
Call placed to pt inform her that an appointment was scheduled at [facility] 7/31 at 1330. Pt verbalizes understanding and agrees with plan of care.

## 2016-08-12 NOTE — Care Management Note (Signed)
Nurse informed verbally to draw a CBC 30 minutes after the pt finishes her platelet transfusion

## 2016-08-12 NOTE — Patient Instructions (Signed)
Blood Transfusion, Adult A blood transfusion is a procedure in which you receive donated blood, including plasma, platelets, and red blood cells, through an IV tube. You may need a blood transfusion because of illness, surgery, or injury. The blood may come from a donor. You may also be able to donate blood for yourself (autologous blood donation) before a surgery if you know that you might require a blood transfusion. The blood given in a transfusion is made up of different types of cells. You may receive:  Red blood cells. These carry oxygen to the cells in the body.  White blood cells. These help you fight infections.  Platelets. These help your blood to clot.  Plasma. This is the liquid part of your blood and it helps with fluid imbalances.  If you have hemophilia or another clotting disorder, you may also receive other types of blood products. Tell a health care provider about:  Any allergies you have.  All medicines you are taking, including vitamins, herbs, eye drops, creams, and over-the-counter medicines.  Any problems you or family members have had with anesthetic medicines.  Any blood disorders you have.  Any surgeries you have had.  Any medical conditions you have, including any recent fever or cold symptoms.  Whether you are pregnant or may be pregnant.  Any previous reactions you have had during a blood transfusion. What are the risks? Generally, this is a safe procedure. However, problems may occur, including:  Having an allergic reaction to something in the donated blood. Hives and itching may be symptoms of this type of reaction.  Fever. This may be a reaction to the white blood cells in the transfused blood. Nausea or chest pain may accompany a fever.  Iron overload. This can happen from having many transfusions.  Transfusion-related acute lung injury (TRALI). This is a rare reaction that causes lung damage. The cause is not known.TRALI can occur within hours  of a transfusion or several days later.  Sudden (acute) or delayed hemolytic reactions. This happens if your blood does not match the cells in your transfusion. Your body's defense system (immune system) may try to attack the new cells. This complication is rare. The symptoms include fever, chills, nausea, and low back pain or chest pain.  Infection or disease transmission. This is rare.  What happens before the procedure?  You will have a blood test to determine your blood type. This is necessary to know what kind of blood your body will accept and to match it to the donor blood.  If you are going to have a planned surgery, you may be able to do an autologous blood donation. This may be done in case you need to have a transfusion.  If you have had an allergic reaction to a transfusion in the past, you may be given medicine to help prevent a reaction. This medicine may be given to you by mouth or through an IV tube.  You will have your temperature, blood pressure, and pulse monitored before the transfusion.  Follow instructions from your health care provider about eating and drinking restrictions.  Ask your health care provider about: ? Changing or stopping your regular medicines. This is especially important if you are taking diabetes medicines or blood thinners. ? Taking medicines such as aspirin and ibuprofen. These medicines can thin your blood. Do not take these medicines before your procedure if your health care provider instructs you not to. What happens during the procedure?  An IV tube will  be inserted into one of your veins.  The bag of donated blood will be attached to your IV tube. The blood will then enter through your vein.  Your temperature, blood pressure, and pulse will be monitored regularly during the transfusion. This monitoring is done to detect early signs of a transfusion reaction.  If you have any signs or symptoms of a reaction, your transfusion will be stopped  and you may be given medicine.  When the transfusion is complete, your IV tube will be removed.  Pressure may be applied to the IV site for a few minutes.  A bandage (dressing) will be applied. The procedure may vary among health care providers and hospitals. What happens after the procedure?  Your temperature, blood pressure, heart rate, breathing rate, and blood oxygen level will be monitored often.  Your blood may be tested to see how you are responding to the transfusion.  You may be warmed with fluids or blankets to maintain a normal body temperature. Summary  A blood transfusion is a procedure in which you receive donated blood, including plasma, platelets, and red blood cells, through an IV tube.  Your temperature, blood pressure, and pulse will be monitored before, during, and after the transfusion.  Your blood may be tested after the transfusion to see how your body has responded. This information is not intended to replace advice given to you by your health care provider. Make sure you discuss any questions you have with your health care provider. Document Released: 12/29/1999 Document Revised: 09/28/2015 Document Reviewed: 09/28/2015 Elsevier Interactive Patient Education  2018 [employer].    Neutropenia Neutropenia is a condition that occurs when you have a lower-than-normal level of a type of white blood cell (neutrophil) in your body. Neutrophils are made in the spongy center of large bones (bone marrow) and they fight infections. Neutrophils are your body's main defense against bacterial and fungal infections. The fewer neutrophils you have and the longer your body remains without them, the greater your risk of getting a severe infection. What are the causes? This condition can occur if your body uses up or destroys neutrophils faster than your bone marrow can make them. This problem may happen because of:  Bacterial or fungal infection.  Allergic  disorders.  Reactions to some medicines.  Autoimmune disease.  An enlarged spleen.  This condition can also occur if your bone marrow does not produce enough neutrophils. This problem may be caused by:  Cancer.  Cancer treatments, such as radiation or chemotherapy.  Viral infections.  Medicines, such as phenytoin.  Vitamin B12 deficiency.  Diseases of the bone marrow.  Environmental toxins, such as insecticides.  What are the signs or symptoms? This condition does not usually cause symptoms. If symptoms are present, they are usually caused by an underlying infection. Symptoms of an infection may include:  Fever.  Chills.  Swollen glands.  Oral or anal ulcers.  Cough and shortness of breath.  Rash.  Skin infection.  Fatigue.  How is this diagnosed? Your health care provider may suspect neutropenia if you have:  A condition that may cause neutropenia.  Symptoms of infection, especially fever.  Frequent and unusual infections.  You will have a medical history and physical exam. Tests will also be done, such as:  A complete blood count (CBC).  A procedure to collect a sample of bone marrow for examination (bone marrow biopsy).  A chest X-ray.  A urine culture.  A blood culture.  How is this  treated? Treatment depends on the underlying cause and severity of your condition. Mild neutropenia may not require treatment. Treatment may include medicines, such as:  Antibiotic medicine given through an IV tube.  Antiviral medicines.  Antifungal medicines.  A medicine to increase neutrophil production (colony-stimulating factor). You may get this drug through an IV tube or by injection.  Steroids given through an IV tube.  If an underlying condition is causing neutropenia, you may need treatment for that condition. If medicines you are taking are causing neutropenia, your health care provider may have you stop taking those medicines. Follow these  instructions at home: Medicines  Take over-the-counter and prescription medicines only as told by your health care provider.  Get a seasonal flu shot (influenza vaccine). Lifestyle  Do not eat unpasteurized foods.Do not eat unwashed raw fruits or vegetables.  Avoid exposure to groups of people or children.  Avoid being around people who are sick.  Avoid being around dirt or dust, such as in construction areas or gardens.  Do not provide direct care for pets. Avoid animal droppings. Do not clean litter boxes and bird cages. Hygiene   Bathe daily.  Clean the area between the genitals and the anus (perineal area) after you urinate or have a bowel movement. If you are female, wipe from front to back.  Brush your teeth with a soft toothbrush before and after meals.  Do not use a razor that has a blade. Use an electric razor to remove hair.  Wash your hands often. Make sure others who come in contact with you also wash their hands. If soap and water are not available, use hand sanitizer. General instructions  Do not have sex unless your health care provider has approved.  Take actions to avoid cuts and burns. For example: ? Be cautious when you use knives. Always cut away from yourself. ? Keep knives in protective sheaths or guards when not in use. ? Use oven mitts when you cook with a hot stove, oven, or grill. ? Stand a safe distance away from open fires.  Avoid people who received a vaccine in the past 30 days if that vaccine contained a live version of the germ (live vaccine). You should not get a live vaccine. Common live vaccines are varicella, measles, mumps, and rubella.  Do not share food utensils.  Do not use tampons, enemas, or rectal suppositories unless your health care provider has approved.  Keep all appointments as told by your health care provider. This is important. Contact a health care provider if:  You have a fever.  You have chills or you start to  shake.  You have: ? A sore throat. ? A warm, red, or tender area on your skin. ? A cough. ? Frequent or painful urination. ? Vaginal discharge or itching.  You develop: ? Sores in your mouth or anus. ? Swollen lymph nodes. ? Red streaks on the skin. ? A rash.  You feel: ? Nauseous or you vomit. ? Very fatigued. ? Short of breath. This information is not intended to replace advice given to you by your health care provider. Make sure you discuss any questions you have with your health care provider. Document Released: 06/22/2001 Document Revised: 06/08/2015 Document Reviewed: 07/13/2014 Elsevier Interactive Patient Education  2018 [employer].    Thrombocytopenia Thrombocytopenia is a condition in which you have an abnormally decreased number of platelets in your blood. Platelets are also called thrombocytes. Platelets are needed for blood clotting. Some cases  of thrombocytopenia are mild while others are more severe. What are the causes? This condition may be caused by:  Decreased production of platelets. This can be caused by: ? Aplastic anemia, in which your bone marrow quits making blood cells. ? Cancer in the bone marrow. ? Use of certain medicines, including chemotherapy. ? Infection in the bone marrow. ? Heavy alcohol consumption.  Increased destruction of platelets. This can be caused by: ? Certain immune diseases. ? Use of certain drugs. ? Certain blood clotting disorders. ? Certain inherited disorders. ? Certain bleeding disorders. ? Pregnancy.  Having an enlarged spleen (hypersplenism). In hypersplenism, the spleen gathers up platelets from circulation. This means that the platelets are not available to help with blood clotting. The spleen can be enlarged because of cirrhosis or other conditions.  What are the signs or symptoms? Symptoms of this condition are side effects of poor blood clotting. They will vary depending on how low the platelet counts are.  Symptoms may include:  Abnormal bleeding.  Nosebleeds.  Heavy menstrual periods.  Blood in the urine or stool (feces).  A purplish discoloration in the skin (purpura).  Bruising.  A rash that looks like pinpoint, purplish-red spots (petechiae) on the skin and mucous membranes.  How is this diagnosed? This condition may be diagnosed with blood tests and a physical exam. Sometimes, a sample of bone marrow may be removed to look for the original cells (megakaryocytes) that make platelets. How is this treated? Treatment for this condition depends on the cause. Treatment options may include:  Treatment of another condition that is causing the low platelet count.  Medicines to help protect your platelets from being destroyed.  A replacement (transfusion) of platelets to stop or prevent bleeding.  Surgery to remove the spleen.  Follow these instructions at home: General instructions  Check your skin and the linings inside your mouth for bruising or bleeding as told by your health care provider.  Check your sputum, urine, and stool for blood as told by your health care provider.  Ask your health care provider if it is okay for you to drink alcohol.  Take over-the-counter and prescription medicines only as told by your health care provider.  Tell all of your health care providers, including dentists and eye doctors, about your condition. Activity  Until your health care provider says it is okay. ? Do not return to any activities that could cause bumps or bruises.  Take extra care not to cut yourself when you shave or when you use scissors, needles, knives, and other tools.  Take extra care not to burn yourself when ironing or cooking. Contact a health care provider if:  You have unexplained bruising. Get help right away if:  You have active bleeding from anywhere on your body.  You have blood in your sputum, urine, or stool. This information is not intended to replace  advice given to you by your health care provider. Make sure you discuss any questions you have with your health care provider. Document Released: 12/31/2004 Document Revised: 09/03/2015 Document Reviewed: 07/04/2014 Elsevier Interactive Patient Education  2018 [employer].

## 2016-08-13 ENCOUNTER — Other Ambulatory Visit: Payer: Self-pay

## 2016-08-13 ENCOUNTER — Telehealth: Payer: Self-pay

## 2016-08-13 DIAGNOSIS — C9200 Acute myeloblastic leukemia, not having achieved remission: Secondary | ICD-10-CM

## 2016-08-13 LAB — BPAM PLATELET PHERESIS
BLOOD PRODUCT EXPIRATION DATE: 201808012359
ISSUE DATE / TIME: 201807301527
Unit Type and Rh: 6200

## 2016-08-13 LAB — PREPARE PLATELET PHERESIS: UNIT DIVISION: 0

## 2016-08-13 NOTE — Telephone Encounter (Signed)
Called pt's care team to confirm that the pt has received a neulasta injection recently. Left message with the office staff of Shelbie Hutching, NP.

## 2016-08-13 NOTE — Telephone Encounter (Signed)
Call placed to the office staff of Shelbie Hutching, NP at [facility] to confirm that the patient got a neulasta injected while admitted on 08/06/16. [facility] to call back with confirmation.

## 2016-08-13 NOTE — Addendum Note (Signed)
Addended by: Aura Fey A on: 08/13/2016 09:03 AM   Modules accepted: Orders

## 2016-08-13 NOTE — Progress Notes (Unsigned)
Critical results of WBC 0.8 and PLTS 19 reported to and read back by Inocencio Homes at [place]. ku

## 2016-08-14 ENCOUNTER — Other Ambulatory Visit (HOSPITAL_BASED_OUTPATIENT_CLINIC_OR_DEPARTMENT_OTHER): Payer: BLUE CROSS/BLUE SHIELD

## 2016-08-14 ENCOUNTER — Ambulatory Visit (HOSPITAL_BASED_OUTPATIENT_CLINIC_OR_DEPARTMENT_OTHER): Payer: BLUE CROSS/BLUE SHIELD

## 2016-08-14 ENCOUNTER — Telehealth: Payer: Self-pay | Admitting: *Deleted

## 2016-08-14 ENCOUNTER — Telehealth: Payer: Self-pay | Admitting: Oncology

## 2016-08-14 ENCOUNTER — Ambulatory Visit (HOSPITAL_COMMUNITY)
Admission: RE | Admit: 2016-08-14 | Discharge: 2016-08-14 | Disposition: A | Discharge status: Home / Self Care | Payer: BLUE CROSS/BLUE SHIELD | Source: Ambulatory Visit | Attending: Oncology | Admitting: Oncology

## 2016-08-14 ENCOUNTER — Other Ambulatory Visit: Payer: Self-pay | Admitting: *Deleted

## 2016-08-14 DIAGNOSIS — C9200 Acute myeloblastic leukemia, not having achieved remission: Secondary | ICD-10-CM | POA: Diagnosis not present

## 2016-08-14 DIAGNOSIS — C9202 Acute myeloblastic leukemia, in relapse: Secondary | ICD-10-CM | POA: Diagnosis present

## 2016-08-14 LAB — CBC WITH DIFFERENTIAL/PLATELET
BASO%: 0.4 % (ref 0.0–2.0)
BASOS ABS: 0 10*3/uL (ref 0.0–0.1)
EOS ABS: 0.1 10*3/uL (ref 0.0–0.5)
EOS%: 5.7 % (ref 0.0–7.0)
HCT: 28.1 % — ABNORMAL LOW (ref 34.8–46.6)
HEMOGLOBIN: 9.6 g/dL — AB (ref 11.6–15.9)
LYMPH%: 49.8 % — AB (ref 14.0–49.7)
MCH: 34.1 pg — AB (ref 25.1–34.0)
MCHC: 34.2 g/dL (ref 31.5–36.0)
MCV: 99.7 fL (ref 79.5–101.0)
MONO#: 0.3 10*3/uL (ref 0.1–0.9)
MONO%: 24 % — AB (ref 0.0–14.0)
NEUT#: 0.2 10*3/uL — CL (ref 1.5–6.5)
NEUT%: 20.1 % — ABNORMAL LOW (ref 38.4–76.8)
PLATELETS: 14 10*3/uL — AB (ref 145–400)
RBC: 2.82 10*6/uL — AB (ref 3.70–5.45)
RDW: 15.1 % — ABNORMAL HIGH (ref 11.2–14.5)
WBC: 1.1 10*3/uL — ABNORMAL LOW (ref 3.9–10.3)
lymph#: 0.5 10*3/uL — ABNORMAL LOW (ref 0.9–3.3)

## 2016-08-14 MED ORDER — SODIUM CHLORIDE 0.9 % IV SOLN
250.0000 mL | Freq: Once | INTRAVENOUS | Status: AC
  Administered 2016-08-14: 250 mL via INTRAVENOUS

## 2016-08-14 MED ORDER — SODIUM CHLORIDE 0.9% FLUSH
10.0000 mL | INTRAVENOUS | Status: AC | PRN
  Administered 2016-08-14: 10 mL
  Filled 2016-08-14: qty 10

## 2016-08-14 MED ORDER — HEPARIN SOD (PORK) LOCK FLUSH 100 UNIT/ML IV SOLN
500.0000 [IU] | Freq: Every day | INTRAVENOUS | Status: AC | PRN
  Administered 2016-08-14: 500 [IU]
  Filled 2016-08-14: qty 5

## 2016-08-14 NOTE — Telephone Encounter (Signed)
Late entry: Dr. Benay Spice reviewed CBC. Aware of ANC 0.2. Pt is on Levaquin. MD made aware that she did not receive Neulasta. Received Zarxio at [facility]. Dr. Benay Spice does not recommend growth factor at this point. Pt instructed to continue Levaquin. Worked in for PLT transfusion in office today. Neutropenia teach back complete. Labs faxed to [facility].

## 2016-08-14 NOTE — Telephone Encounter (Signed)
lvm to inform pt of lab appt 8/1 at 2 pm per sch msg

## 2016-08-14 NOTE — Patient Instructions (Signed)
Platelet Transfusion A platelet transfusion is a procedure in which you receive donated platelets through an IV tube. Platelets are tiny pieces of blood cells. When a blood vessel is damaged, platelets collect in the damaged area to help form a blood clot. This begins the healing process. If your platelet count gets too low, your blood may have trouble clotting. You may need a platelet transfusion if you have a condition that causes a low number of platelets (thrombocytopenia). A platelet transfusion may be used to stop or prevent bleeding. Tell a health care provider about:  Any allergies you have.  All medicines you are taking, including vitamins, herbs, eye drops, creams, and over-the-counter medicines.  Any problems you or family members have had with anesthetic medicines.  Any blood disorders you have.  Any surgeries you have had.  Any medical conditions you have.  Any reactions you have had during a previous transfusion. What are the risks? Generally, this is a safe procedure. However, problems may occur, including:  Fever with or without chills. The fever usually occurs within the first 4 hours of the transfusion and returns to normal within 48 hours.  Allergic reaction. The reaction is most commonly caused by antibodies your body creates against substances in the transfusion. Signs of an allergic reaction may include itching, hives, difficulty breathing, shock, or low blood pressure.  Sudden (acute) or delayed hemolytic reaction. This rare reaction can occur during the transfusion and up to 28 days after the transfusion. The reaction usually occurs when your body's defense system (immune system) attacks the new platelets. Signs of a hemolytic reaction may include fever, headache, difficulty breathing, low blood pressure, a rapid heartbeat, or pain in your back, abdomen, chest, or IV site.  Transfusion-related acute lung injury (TRALI). TRALI can occur within hours of a transfusion,  or several days later. This is a rare reaction that causes lung damage. The cause is not known.  Infection. Signs of this rare complication may include fever, chills, vomiting, a rapid heartbeat, or low blood pressure.  What happens before the procedure?  You may have a blood test to determine your blood type. This is necessary to find out what kind ofplatelets best matches your platelets.  If you have had an allergic reaction to a transfusion in the past, you may be given medicine to help prevent a reaction. Take this medicine only as directed by your health care provider.  Your temperature, blood pressure, and pulse will be monitored before the transfusion. What happens during the procedure?  An IV will be started in your hand or arm.  The transfusion will be attached to your IV tubing. The bag of donated platelets will be attached to your IV tube andgiven into your vein.  Your temperature, blood pressure, and pulse will be monitored regularly during the transfusion. This monitoring is done to help detect early signs of a transfusion reaction.  If you have any signs or symptoms of a reaction, your transfusion will be stopped and you may be given medicine.  When your transfusion is complete, your IV will be removed.  Pressure may be applied to the IV site for a few minutes.  A bandage (dressing) will be applied. The procedure may vary among health care providers and hospitals. What happens after the procedure?  Your blood pressure, temperature, and pulse will be monitored regularly. This information is not intended to replace advice given to you by your health care provider. Make sure you discuss any questions you have   with your health care provider. Document Released: 10/28/2006 Document Revised: 06/08/2015 Document Reviewed: 11/10/2013 Elsevier Interactive Patient Education  2018 [employer]  

## 2016-08-14 NOTE — Telephone Encounter (Signed)
Left message for pt to call back regarding setting up time today for lab.  Message also to [facility]/Scheduling to schedule pt & call her.

## 2016-08-15 ENCOUNTER — Ambulatory Visit: Payer: BLUE CROSS/BLUE SHIELD

## 2016-08-15 ENCOUNTER — Other Ambulatory Visit (HOSPITAL_BASED_OUTPATIENT_CLINIC_OR_DEPARTMENT_OTHER): Payer: BLUE CROSS/BLUE SHIELD

## 2016-08-15 DIAGNOSIS — C9200 Acute myeloblastic leukemia, not having achieved remission: Secondary | ICD-10-CM | POA: Diagnosis not present

## 2016-08-15 LAB — PREPARE PLATELET PHERESIS: UNIT DIVISION: 0

## 2016-08-15 LAB — CBC WITH DIFFERENTIAL/PLATELET
BASO%: 0 % (ref 0.0–2.0)
BASOS ABS: 0 10*3/uL (ref 0.0–0.1)
EOS ABS: 0 10*3/uL (ref 0.0–0.5)
EOS%: 2.6 % (ref 0.0–7.0)
HEMATOCRIT: 28 % — AB (ref 34.8–46.6)
HEMOGLOBIN: 9.5 g/dL — AB (ref 11.6–15.9)
LYMPH#: 0.4 10*3/uL — AB (ref 0.9–3.3)
LYMPH%: 36 % (ref 14.0–49.7)
MCH: 33.8 pg (ref 25.1–34.0)
MCHC: 33.9 g/dL (ref 31.5–36.0)
MCV: 99.6 fL (ref 79.5–101.0)
MONO#: 0.4 10*3/uL (ref 0.1–0.9)
MONO%: 36 % — ABNORMAL HIGH (ref 0.0–14.0)
NEUT%: 25.4 % — AB (ref 38.4–76.8)
NEUTROS ABS: 0.3 10*3/uL — AB (ref 1.5–6.5)
PLATELETS: 73 10*3/uL — AB (ref 145–400)
RBC: 2.81 10*6/uL — AB (ref 3.70–5.45)
RDW: 14.1 % (ref 11.2–14.5)
WBC: 1.1 10*3/uL — ABNORMAL LOW (ref 3.9–10.3)

## 2016-08-15 LAB — BPAM PLATELET PHERESIS
Blood Product Expiration Date: 201808032359
ISSUE DATE / TIME: 201808011604
Unit Type and Rh: 6200

## 2016-08-15 NOTE — Progress Notes (Signed)
CBC, critical ANC 0.3 reviewed by Dr. Benay Spice: Follow up as scheduled. Call office for fever, shaking chills or bleeding. Neutropenic precautions discussed. Teach back complete. Result will be forwarded to Dr. Florene Glen.

## 2016-08-19 ENCOUNTER — Ambulatory Visit: Payer: BLUE CROSS/BLUE SHIELD

## 2016-08-19 ENCOUNTER — Other Ambulatory Visit (HOSPITAL_BASED_OUTPATIENT_CLINIC_OR_DEPARTMENT_OTHER): Payer: BLUE CROSS/BLUE SHIELD

## 2016-08-19 ENCOUNTER — Other Ambulatory Visit: Payer: Self-pay

## 2016-08-19 ENCOUNTER — Ambulatory Visit (HOSPITAL_BASED_OUTPATIENT_CLINIC_OR_DEPARTMENT_OTHER): Payer: BLUE CROSS/BLUE SHIELD | Admitting: Oncology

## 2016-08-19 VITALS — BP 100/40 | HR 80 | Temp 98.4°F | Resp 18 | Ht 65.75 in | Wt 117.5 lb

## 2016-08-19 DIAGNOSIS — C9200 Acute myeloblastic leukemia, not having achieved remission: Secondary | ICD-10-CM | POA: Diagnosis not present

## 2016-08-19 DIAGNOSIS — D61810 Antineoplastic chemotherapy induced pancytopenia: Secondary | ICD-10-CM | POA: Diagnosis not present

## 2016-08-19 DIAGNOSIS — C9202 Acute myeloblastic leukemia, in relapse: Secondary | ICD-10-CM

## 2016-08-19 LAB — COMPREHENSIVE METABOLIC PANEL
ALT: 17 U/L (ref 0–55)
AST: 16 U/L (ref 5–34)
Albumin: 3.8 g/dL (ref 3.5–5.0)
Alkaline Phosphatase: 123 U/L (ref 40–150)
Anion Gap: 5 mEq/L (ref 3–11)
BILIRUBIN TOTAL: 0.26 mg/dL (ref 0.20–1.20)
BUN: 15.5 mg/dL (ref 7.0–26.0)
CHLORIDE: 106 meq/L (ref 98–109)
CO2: 29 meq/L (ref 22–29)
Calcium: 10.1 mg/dL (ref 8.4–10.4)
Creatinine: 0.7 mg/dL (ref 0.6–1.1)
EGFR: 86 mL/min/{1.73_m2} — AB (ref >90)
GLUCOSE: 72 mg/dL (ref 70–140)
POTASSIUM: 3.9 meq/L (ref 3.5–5.1)
SODIUM: 140 meq/L (ref 136–145)
TOTAL PROTEIN: 6.4 g/dL (ref 6.4–8.3)

## 2016-08-19 LAB — CBC WITH DIFFERENTIAL/PLATELET
BASO%: 0.3 % (ref 0.0–2.0)
Basophils Absolute: 0 10*3/uL (ref 0.0–0.1)
EOS ABS: 0 10*3/uL (ref 0.0–0.5)
EOS%: 1.9 % (ref 0.0–7.0)
HEMATOCRIT: 30.6 % — AB (ref 34.8–46.6)
HEMOGLOBIN: 10.4 g/dL — AB (ref 11.6–15.9)
LYMPH#: 0.4 10*3/uL — AB (ref 0.9–3.3)
LYMPH%: 22.7 % (ref 14.0–49.7)
MCH: 34.2 pg — ABNORMAL HIGH (ref 25.1–34.0)
MCHC: 34.1 g/dL (ref 31.5–36.0)
MCV: 100.1 fL (ref 79.5–101.0)
MONO#: 0.6 10*3/uL (ref 0.1–0.9)
MONO%: 34 % — AB (ref 0.0–14.0)
NEUT%: 41.1 % (ref 38.4–76.8)
NEUTROS ABS: 0.8 10*3/uL — AB (ref 1.5–6.5)
Platelets: 121 10*3/uL — ABNORMAL LOW (ref 145–400)
RBC: 3.06 10*6/uL — AB (ref 3.70–5.45)
RDW: 15.4 % — AB (ref 11.2–14.5)
WBC: 1.9 10*3/uL — AB (ref 3.9–10.3)

## 2016-08-19 LAB — MAGNESIUM: Magnesium: 2.3 mg/dl (ref 1.5–2.5)

## 2016-08-19 NOTE — Progress Notes (Signed)
  [facility] OFFICE PROGRESS NOTE   Diagnosis: AML  INTERVAL HISTORY:   Barbara Benitez returns as scheduled. She received platelet transfusions on 08/12/2016 and 08/14/2016. No bleeding or fever. She reports feeling "dizzy "at times. She is scheduled for an appointment at [facility] on 08/21/2016.  Objective:  Vital signs in last 24 hours:  Blood pressure (!) 100/40, pulse 80, temperature 98.4 F (36.9 C), temperature source Oral, resp. rate 18, height 5' 5.75" (1.67 m), weight 117 lb 8 oz (53.3 kg), SpO2 100 %.    HEENT: No thrush or ulcers. Oral cavity without bleeding Resp: Lungs clear bilaterally Cardio: Regular rate and rhythm GI: No hepatosplenomegaly, nontender Vascular: No leg edema  Skin: Resolving ecchymosis at the left thigh   Portacath/PICC-without erythema-resolving ecchymosis over the port site  Lab Results:  Lab Results  Component Value Date   WBC 1.9 (L) 08/19/2016   HGB 10.4 (L) 08/19/2016   HCT 30.6 (L) 08/19/2016   MCV 100.1 08/19/2016   PLT 121 (L) 08/19/2016   NEUTROABS 0.8 (L) 08/19/2016    CMP     Component Value Date/Time   NA 140 08/19/2016 1048   K 3.9 08/19/2016 1048   CL 108 03/26/2016 0923   CO2 29 08/19/2016 1048   GLUCOSE 72 08/19/2016 1048   BUN 15.5 08/19/2016 1048   CREATININE 0.7 08/19/2016 1048   CALCIUM 10.1 08/19/2016 1048   PROT 6.4 08/19/2016 1048   ALBUMIN 3.8 08/19/2016 1048   AST 16 08/19/2016 1048   ALT 17 08/19/2016 1048   ALKPHOS 123 08/19/2016 1048   BILITOT 0.26 08/19/2016 1048   GFRNONAA >60 03/26/2016 0923   GFRAA >60 03/26/2016 0923     Medications: I have reviewed the patient's current medications.  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 Induction chemotherapy with cytarabine plus idarubicin, 7/3, beginning 07/06/2012 ? Consolidation with high-dose cytarabine for 4 cycles, last cycle 01/18/2013 ? End of treatment bone marrow biopsy  03/04/2013-remission with normal karyotype/FISH ? 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 Induction therapy with high-dose cytarabine/mitoxantrone plus [id number] study at [facility] 04/27/2016 ? [facility] bone marrow biopsy with focal blast up to 10%, reinduction 05/02/2016 ? Cycle 1 consolidation therapy with high-dose cytarabine/mitoxantrone plus CPI-613 06/21/2016 ? Cycle 2 consolidation with high-dose cytarabine, mitoxantrone, and CPI-613 08/02/2016  2. Pancytopenia secondary to chemotherapy-improved    Disposition:  Barbara Benitez appears well. The hematologic indices are improving after cycle 2 consolidation chemotherapy. She is scheduled for follow-up at [facility] 08/21/2016. We will see her in 2 months and sooner as needed pending the decision on further consolidation chemotherapy versus allogeneic transplant.  15 minutes were spent with the patient today. The majority of the time was used for counseling and coordination of care.  Donneta Romberg, MD  08/19/2016  11:49 AM

## 2016-08-22 ENCOUNTER — Ambulatory Visit: Payer: BLUE CROSS/BLUE SHIELD

## 2016-08-22 ENCOUNTER — Telehealth: Payer: Self-pay | Admitting: *Deleted

## 2016-08-22 ENCOUNTER — Other Ambulatory Visit: Payer: BLUE CROSS/BLUE SHIELD

## 2016-08-22 NOTE — Telephone Encounter (Signed)
Fax from [facility] requesting labs on 8/13. Message to schedulers. Pt has standing orders.

## 2016-08-26 ENCOUNTER — Telehealth: Payer: Self-pay | Admitting: *Deleted

## 2016-08-26 ENCOUNTER — Other Ambulatory Visit (HOSPITAL_BASED_OUTPATIENT_CLINIC_OR_DEPARTMENT_OTHER): Payer: BLUE CROSS/BLUE SHIELD

## 2016-08-26 DIAGNOSIS — C9200 Acute myeloblastic leukemia, not having achieved remission: Secondary | ICD-10-CM

## 2016-08-26 DIAGNOSIS — C9202 Acute myeloblastic leukemia, in relapse: Secondary | ICD-10-CM

## 2016-08-26 LAB — CBC WITH DIFFERENTIAL/PLATELET
BASO%: 0.4 % (ref 0.0–2.0)
Basophils Absolute: 0 10*3/uL (ref 0.0–0.1)
EOS ABS: 0 10*3/uL (ref 0.0–0.5)
EOS%: 0.8 % (ref 0.0–7.0)
HEMATOCRIT: 30.2 % — AB (ref 34.8–46.6)
HGB: 10.1 g/dL — ABNORMAL LOW (ref 11.6–15.9)
LYMPH#: 0.4 10*3/uL — AB (ref 0.9–3.3)
LYMPH%: 16.2 % (ref 14.0–49.7)
MCH: 34.5 pg — ABNORMAL HIGH (ref 25.1–34.0)
MCHC: 33.4 g/dL (ref 31.5–36.0)
MCV: 103.1 fL — ABNORMAL HIGH (ref 79.5–101.0)
MONO#: 0.6 10*3/uL (ref 0.1–0.9)
MONO%: 24.6 % — ABNORMAL HIGH (ref 0.0–14.0)
NEUT%: 58 % (ref 38.4–76.8)
NEUTROS ABS: 1.5 10*3/uL (ref 1.5–6.5)
PLATELETS: 206 10*3/uL (ref 145–400)
RBC: 2.93 10*6/uL — ABNORMAL LOW (ref 3.70–5.45)
RDW: 17 % — ABNORMAL HIGH (ref 11.2–14.5)
WBC: 2.6 10*3/uL — AB (ref 3.9–10.3)

## 2016-08-26 LAB — COMPREHENSIVE METABOLIC PANEL
ALT: 14 U/L (ref 0–55)
ANION GAP: 5 meq/L (ref 3–11)
AST: 19 U/L (ref 5–34)
Albumin: 3.7 g/dL (ref 3.5–5.0)
Alkaline Phosphatase: 99 U/L (ref 40–150)
BUN: 16.5 mg/dL (ref 7.0–26.0)
CALCIUM: 10.1 mg/dL (ref 8.4–10.4)
CHLORIDE: 105 meq/L (ref 98–109)
CO2: 29 mEq/L (ref 22–29)
Creatinine: 0.7 mg/dL (ref 0.6–1.1)
EGFR: 86 mL/min/{1.73_m2} — AB (ref >90)
Glucose: 102 mg/dl (ref 70–140)
POTASSIUM: 4.2 meq/L (ref 3.5–5.1)
Sodium: 139 mEq/L (ref 136–145)
Total Bilirubin: 0.29 mg/dL (ref 0.20–1.20)
Total Protein: 6.4 g/dL (ref 6.4–8.3)

## 2016-08-26 LAB — MAGNESIUM: MAGNESIUM: 2.2 mg/dL (ref 1.5–2.5)

## 2016-08-26 NOTE — Telephone Encounter (Addendum)
Spoke with pt in lobby, informed her labs are OK. No transfusion needed. She reports her cardiac work up showed she'd had a "heart attack." She feels short of breath today- seems like her asthma, per pt.  Noted she is being referred to cardiology at [facility]. Instructed her to call Dr. Abel Presto office to see if they can get cardiology referral sooner or should she F/U with PCP. She reports Dr. Inda Merlin (PCP) is on vacation this week.

## 2016-08-26 NOTE — Telephone Encounter (Signed)
Left message informing pt CBC looked OK today. Blood bank hold canceled.

## 2016-08-26 NOTE — Telephone Encounter (Signed)
Ok, we can see this week if not seen at [facility]

## 2016-08-27 ENCOUNTER — Telehealth: Payer: Self-pay

## 2016-08-27 NOTE — Telephone Encounter (Signed)
Left a voice message for the patient with the upcoming appointment date for 10/21/16

## 2016-09-12 ENCOUNTER — Other Ambulatory Visit (HOSPITAL_COMMUNITY)
Admission: RE | Admit: 2016-09-12 | Discharge: 2016-09-12 | Disposition: A | Discharge status: Home / Self Care | Payer: BLUE CROSS/BLUE SHIELD | Source: Ambulatory Visit | Attending: Nurse Practitioner | Admitting: Nurse Practitioner

## 2016-09-12 ENCOUNTER — Other Ambulatory Visit: Payer: Self-pay | Admitting: Nurse Practitioner

## 2016-09-12 DIAGNOSIS — Z124 Encounter for screening for malignant neoplasm of cervix: Secondary | ICD-10-CM | POA: Insufficient documentation

## 2016-09-12 DIAGNOSIS — Z1231 Encounter for screening mammogram for malignant neoplasm of breast: Secondary | ICD-10-CM

## 2016-09-12 DIAGNOSIS — R102 Pelvic and perineal pain: Secondary | ICD-10-CM

## 2016-09-13 LAB — CYTOLOGY - PAP
Diagnosis: NEGATIVE
HPV: NOT DETECTED

## 2016-09-24 ENCOUNTER — Other Ambulatory Visit: Payer: BLUE CROSS/BLUE SHIELD

## 2016-10-03 ENCOUNTER — Ambulatory Visit: Payer: BLUE CROSS/BLUE SHIELD

## 2016-10-21 ENCOUNTER — Ambulatory Visit: Payer: BLUE CROSS/BLUE SHIELD | Admitting: Oncology

## 2016-11-12 ENCOUNTER — Other Ambulatory Visit: Payer: BLUE CROSS/BLUE SHIELD

## 2016-11-14 ENCOUNTER — Ambulatory Visit: Payer: BLUE CROSS/BLUE SHIELD

## 2016-11-19 ENCOUNTER — Telehealth: Payer: Self-pay | Admitting: Oncology

## 2016-11-19 ENCOUNTER — Ambulatory Visit (HOSPITAL_BASED_OUTPATIENT_CLINIC_OR_DEPARTMENT_OTHER): Payer: BLUE CROSS/BLUE SHIELD | Admitting: Oncology

## 2016-11-19 VITALS — BP 120/49 | HR 82 | Temp 98.6°F | Resp 18 | Ht 65.0 in | Wt 118.7 lb

## 2016-11-19 DIAGNOSIS — D630 Anemia in neoplastic disease: Secondary | ICD-10-CM

## 2016-11-19 DIAGNOSIS — D6481 Anemia due to antineoplastic chemotherapy: Secondary | ICD-10-CM | POA: Diagnosis not present

## 2016-11-19 DIAGNOSIS — C9201 Acute myeloblastic leukemia, in remission: Secondary | ICD-10-CM

## 2016-11-19 DIAGNOSIS — Z9484 Stem cells transplant status: Secondary | ICD-10-CM

## 2016-11-19 DIAGNOSIS — R197 Diarrhea, unspecified: Secondary | ICD-10-CM

## 2016-11-19 DIAGNOSIS — C9200 Acute myeloblastic leukemia, not having achieved remission: Secondary | ICD-10-CM

## 2016-11-19 NOTE — Progress Notes (Signed)
  [place] OFFICE PROGRESS NOTE   Diagnosis: AML  INTERVAL HISTORY:   Ms. Olds returns as scheduled.  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  She feels well.  She has mild intermittent diarrhea and nausea.  She is followed closely by the transplant team at [facility].  Objective:  Vital signs in last 24 hours:  Blood pressure (!) 120/49, pulse 82, temperature 98.6 F (37 C), temperature source Oral, resp. rate 18, height 5' 5" (1.651 m), weight 118 lb 11.2 oz (53.8 kg), SpO2 100 %.    HEENT: No thrush or ulcers Resp: Lungs clear bilaterally Cardio: Regular rate and rhythm GI: No hepatosplenomegaly, nontender Vascular: No leg edema  Skin: Rash and a tape distribution surrounding the Port-A-Cath.  No other rash   Lab Results: Labs from [facility] 11/18/2016-reviewed Medications: I have reviewed the patient's current medications.  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 Induction chemotherapy with cytarabine plus idarubicin, 7/3, beginning 07/06/2012 ? Consolidation with high-dose cytarabine for 4 cycles, last cycle 01/18/2013 ? End of treatment bone marrow biopsy 03/04/2013-remission with normal karyotype/FISH ? 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 Induction therapy with high-dose cytarabine/mitoxantrone plus CPI-613on study at [facility] 04/27/2016 ? Nadir bone marrow biopsy with focal blast up to 10%, reinduction 05/02/2016 ? Cycle 1 consolidation therapy with high-dose cytarabine/mitoxantrone plus CPI-613 06/21/2016 ? Cycle 2 consolidation with high-dose cytarabine, mitoxantrone, and CPI-61307/20/2018 ?  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 Klahr is now almost 2  months out from an allogeneic transplant for treatment of AML in second remission.  She appears well.  She is followed closely by the transplant team at [facility].  She will return for an office visit here in 4 months.  I am available to see her sooner as needed.  15 minutes were spent with the patient today.  The majority of the time was used for counseling and coordination of care.  Donneta Romberg, MD  11/19/2016  12:45 PM

## 2016-11-19 NOTE — Telephone Encounter (Signed)
Gave avs and calendar for March 2019 °

## 2016-12-02 ENCOUNTER — Telehealth: Payer: Self-pay | Admitting: *Deleted

## 2016-12-02 NOTE — Telephone Encounter (Signed)
"  Megan RN with [facility].  Trying to schedule IVF's here for this patient on Friday.  She asked that we schedule in [place]."  Call transferred [phone] call information to collaborative nurse and provider for review.  Further patient communication through collaborative nurse.Marland Kitchen

## 2016-12-02 NOTE — Telephone Encounter (Signed)
Dr. Benay Spice notified of request. MD is asking why does she need hydration? Schedule message sent to see if pt can be worked in for IV hydration. Left message for Jinny Blossom at [facility] informing her of above.

## 2016-12-03 NOTE — Telephone Encounter (Signed)
Spoke with Barbara Benitez at [facility], she reports pt's creatinine has risen so they are ordering IV fluids a couple times per week. Informed her we are unable to schedule fluids 11/23. Will call if there are cancellations.

## 2016-12-04 ENCOUNTER — Other Ambulatory Visit: Payer: Self-pay | Admitting: Emergency Medicine

## 2016-12-04 ENCOUNTER — Telehealth: Payer: Self-pay | Admitting: Emergency Medicine

## 2016-12-04 DIAGNOSIS — C9200 Acute myeloblastic leukemia, not having achieved remission: Secondary | ICD-10-CM

## 2016-12-04 MED ORDER — SODIUM CHLORIDE 0.9 % IV SOLN: INTRAVENOUS | Status: DC

## 2016-12-04 NOTE — Telephone Encounter (Signed)
Pt scheduled to see Ned Card on 11/23 at 12:15 and infusion for IVF @ 1:15. PT made aware.

## 2016-12-06 ENCOUNTER — Ambulatory Visit (HOSPITAL_BASED_OUTPATIENT_CLINIC_OR_DEPARTMENT_OTHER): Payer: BLUE CROSS/BLUE SHIELD | Admitting: Nurse Practitioner

## 2016-12-06 ENCOUNTER — Ambulatory Visit (HOSPITAL_BASED_OUTPATIENT_CLINIC_OR_DEPARTMENT_OTHER): Payer: BLUE CROSS/BLUE SHIELD

## 2016-12-06 ENCOUNTER — Encounter: Payer: Self-pay | Admitting: Nurse Practitioner

## 2016-12-06 VITALS — BP 118/63 | HR 90 | Temp 98.7°F | Resp 18 | Ht 65.0 in | Wt 119.6 lb

## 2016-12-06 DIAGNOSIS — R7989 Other specified abnormal findings of blood chemistry: Secondary | ICD-10-CM | POA: Diagnosis not present

## 2016-12-06 DIAGNOSIS — C9201 Acute myeloblastic leukemia, in remission: Secondary | ICD-10-CM | POA: Diagnosis not present

## 2016-12-06 DIAGNOSIS — D6481 Anemia due to antineoplastic chemotherapy: Secondary | ICD-10-CM | POA: Diagnosis not present

## 2016-12-06 DIAGNOSIS — D638 Anemia in other chronic diseases classified elsewhere: Secondary | ICD-10-CM | POA: Diagnosis not present

## 2016-12-06 MED ORDER — SODIUM CHLORIDE 0.9% FLUSH
10.0000 mL | Freq: Once | INTRAVENOUS | Status: AC
  Administered 2016-12-06: 10 mL via INTRAVENOUS
  Filled 2016-12-06: qty 10

## 2016-12-06 MED ORDER — HEPARIN SOD (PORK) LOCK FLUSH 100 UNIT/ML IV SOLN
500.0000 [IU] | Freq: Once | INTRAVENOUS | Status: AC
  Administered 2016-12-06: 500 [IU] via INTRAVENOUS
  Filled 2016-12-06: qty 5

## 2016-12-06 MED ORDER — SODIUM CHLORIDE 0.9 % IV SOLN
INTRAVENOUS | Status: AC
  Administered 2016-12-06: 13:00:00 via INTRAVENOUS

## 2016-12-06 NOTE — Progress Notes (Signed)
  [facility] Benite[facility] OFFICE PROGRESS NOTE   Diagnosis: AML  INTERVAL HISTORY:   Barbara Benitez is seen today prior to proceeding with IV fluids.  Our office was contacted by [facility] earlier this week requesting administration of IV fluids due to an elevated creatinine.  She overall feels well.  She has intermittent mild nausea.  No vomiting.  No diarrhea.  No fever.  No rash.  She has increased oral fluid intake.  She reports that tacrolimus has been dose reduced.  Objective:  Vital signs in last 24 hours:  Blood pressure 118/63, pulse 90, temperature 98.7 F (37.1 C), temperature source Oral, resp. rate 18, height '5\' 5"'$  (1.651 m), weight 119 lb 9.6 oz (54.3 kg), SpO2 100 %.    HEENT: No thrush or ulcers.  Mucous membranes appear moist. Resp: Lungs clear bilaterally. Cardio: Regular rate and rhythm. GI: Abdomen soft and nontender.  No hepatomegaly. Vascular: No leg edema. Neuro: Alert and oriented. Port-A-Cath without erythema.  Lab Results:  Lab Results  Component Value Date   WBC 2.6 (L) 08/26/2016   HGB 10.1 (L) 08/26/2016   HCT 30.2 (L) 08/26/2016   MCV 103.1 (H) 08/26/2016   PLT 206 08/26/2016   NEUTROABS 1.5 08/26/2016    Imaging:  No results found.  Medications: I have reviewed the patient's current medications.  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 Induction chemotherapy with cytarabine plus idarubicin, 7/3, beginning 07/06/2012 ? Consolidation with high-dose cytarabine for 4 cycles, last cycle 01/18/2013 ? End of treatment bone marrow biopsy 03/04/2013-remission with normal karyotype/FISH ? 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 Induction therapy with high-dose cytarabine/mitoxantrone plus CPI-613on study at [facility] 04/27/2016 ? Nadir bone marrow biopsy with focal blast up to 10%, reinduction 05/02/2016 ? Cycle 1 consolidation therapy with high-dose  cytarabine/mitoxantrone plus CPI-613 06/21/2016 ? Cycle 2 consolidation with high-dose cytarabine, mitoxantrone, and CPI-61307/20/2018 ?  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  Elevated creatinine 12/02/2016 (1.45); Tacrolimus dose reduced, IV fluids administered 12/02/2016 and 12/06/2016.   Disposition: Barbara Benitez appears stable.  Creatinine noted to be elevated earlier this week.  Tacrolimus has been dose reduced.  She received IV fluids 12/02/2016.  She will receive a liter of normal saline today and  follow-up at [facility] as scheduled 12/09/2016.  Plan reviewed with Dr. Benay Spice.  Ned Card ANP/GNP-BC   12/06/2016  12:35 PM

## 2016-12-06 NOTE — Progress Notes (Signed)
Pt has a double port in right chest placed 06/07/16 at [facility] by Dr. Georgina Quint and Dr. Quentin Cornwall.  Report states that the tip of the catheter is terminating in the right atrium.  Results can be found under care everywhere.

## 2016-12-06 NOTE — Patient Instructions (Signed)
Dehydration, Adult Dehydration is when there is not enough fluid or water in your body. This happens when you lose more fluids than you take in. Dehydration can range from mild to very bad. It should be treated right away to keep it from getting very bad. Symptoms of mild dehydration may include:  Thirst.  Dry lips.  Slightly dry mouth.  Dry, warm skin.  Dizziness. Symptoms of moderate dehydration may include:  Very dry mouth.  Muscle cramps.  Dark pee (urine). Pee may be the color of tea.  Your body making less pee.  Your eyes making fewer tears.  Heartbeat that is uneven or faster than normal (palpitations).  Headache.  Light-headedness, especially when you stand up from sitting.  Fainting (syncope). Symptoms of very bad dehydration may include:  Changes in skin, such as: ? Cold and clammy skin. ? Blotchy (mottled) or pale skin. ? Skin that does not quickly return to normal after being lightly pinched and let go (poor skin turgor).  Changes in body fluids, such as: ? Feeling very thirsty. ? Your eyes making fewer tears. ? Not sweating when body temperature is high, such as in hot weather. ? Your body making very little pee.  Changes in vital signs, such as: ? Weak pulse. ? Pulse that is more than 100 beats a minute when you are sitting still. ? Fast breathing. ? Low blood pressure.  Other changes, such as: ? Sunken eyes. ? Cold hands and feet. ? Confusion. ? Lack of energy (lethargy). ? Trouble waking up from sleep. ? Short-term weight loss. ? Unconsciousness. Follow these instructions at home:  If told by your doctor, drink an ORS: ? Make an ORS by using instructions on the package. ? Start by drinking small amounts, about  cup (120 mL) every 5-10 minutes. ? Slowly drink more until you have had the amount that your doctor said to have.  Drink enough clear fluid to keep your pee clear or pale yellow. If you were told to drink an ORS, finish the ORS  first, then start slowly drinking clear fluids. Drink fluids such as: ? Water. Do not drink only water by itself. Doing that can make the salt (sodium) level in your body get too low (hyponatremia). ? Ice chips. ? Fruit juice that you have added water to (diluted). ? Low-calorie sports drinks.  Avoid: ? Alcohol. ? Drinks that have a lot of sugar. These include high-calorie sports drinks, fruit juice that does not have water added, and soda. ? Caffeine. ? Foods that are greasy or have a lot of fat or sugar.  Take over-the-counter and prescription medicines only as told by your doctor.  Do not take salt tablets. Doing that can make the salt level in your body get too high (hypernatremia).  Eat foods that have minerals (electrolytes). Examples include bananas, oranges, potatoes, tomatoes, and spinach.  Keep all follow-up visits as told by your doctor. This is important. Contact a doctor if:  You have belly (abdominal) pain that: ? Gets worse. ? Stays in one area (localizes).  You have a rash.  You have a stiff neck.  You get angry or annoyed more easily than normal (irritability).  You are more sleepy than normal.  You have a harder time waking up than normal.  You feel: ? Weak. ? Dizzy. ? Very thirsty.  You have peed (urinated) only a small amount of very dark pee during 6-8 hours. Get help right away if:  You have symptoms of   very bad dehydration.  You cannot drink fluids without throwing up (vomiting).  Your symptoms get worse with treatment.  You have a fever.  You have a very bad headache.  You are throwing up or having watery poop (diarrhea) and it: ? Gets worse. ? Does not go away.  You have blood or something green (bile) in your throw-up.  You have blood in your poop (stool). This may cause poop to look black and tarry.  You have not peed in 6-8 hours.  You pass out (faint).  Your heart rate when you are sitting still is more than 100 beats a  minute.  You have trouble breathing. This information is not intended to replace advice given to you by your health care provider. Make sure you discuss any questions you have with your health care provider. Document Released: 10/27/2008 Document Revised: 07/21/2015 Document Reviewed: 02/24/2015 Elsevier Interactive Patient Education  2018 Elsevier Inc.  

## 2017-01-15 ENCOUNTER — Encounter: Payer: Self-pay | Admitting: *Deleted

## 2017-03-03 ENCOUNTER — Other Ambulatory Visit: Payer: Self-pay | Admitting: Nurse Practitioner

## 2017-03-03 ENCOUNTER — Other Ambulatory Visit: Payer: Self-pay | Admitting: Obstetrics and Gynecology

## 2017-03-03 DIAGNOSIS — R102 Pelvic and perineal pain: Secondary | ICD-10-CM

## 2017-03-03 DIAGNOSIS — R109 Unspecified abdominal pain: Secondary | ICD-10-CM

## 2017-03-12 ENCOUNTER — Ambulatory Visit
Admission: RE | Admit: 2017-03-12 | Discharge: 2017-03-12 | Disposition: A | Discharge status: Home / Self Care | Payer: BLUE CROSS/BLUE SHIELD | Source: Ambulatory Visit | Attending: Nurse Practitioner | Admitting: Nurse Practitioner

## 2017-03-12 DIAGNOSIS — R102 Pelvic and perineal pain: Secondary | ICD-10-CM

## 2017-03-12 DIAGNOSIS — R109 Unspecified abdominal pain: Secondary | ICD-10-CM

## 2017-03-20 ENCOUNTER — Telehealth: Payer: Self-pay | Admitting: Oncology

## 2017-03-20 ENCOUNTER — Inpatient Hospital Stay: Payer: BLUE CROSS/BLUE SHIELD | Attending: Oncology | Admitting: Oncology

## 2017-03-20 VITALS — BP 106/47 | HR 73 | Temp 98.4°F | Resp 17 | Ht 65.0 in | Wt 123.6 lb

## 2017-03-20 DIAGNOSIS — R1032 Left lower quadrant pain: Secondary | ICD-10-CM

## 2017-03-20 DIAGNOSIS — R21 Rash and other nonspecific skin eruption: Secondary | ICD-10-CM | POA: Diagnosis not present

## 2017-03-20 DIAGNOSIS — D6481 Anemia due to antineoplastic chemotherapy: Secondary | ICD-10-CM

## 2017-03-20 DIAGNOSIS — C9201 Acute myeloblastic leukemia, in remission: Secondary | ICD-10-CM

## 2017-03-20 DIAGNOSIS — D638 Anemia in other chronic diseases classified elsewhere: Secondary | ICD-10-CM | POA: Diagnosis not present

## 2017-03-20 DIAGNOSIS — T451X5A Adverse effect of antineoplastic and immunosuppressive drugs, initial encounter: Secondary | ICD-10-CM | POA: Diagnosis not present

## 2017-03-20 DIAGNOSIS — R197 Diarrhea, unspecified: Secondary | ICD-10-CM | POA: Diagnosis not present

## 2017-03-20 NOTE — Progress Notes (Signed)
  Barbara Benitez OFFICE PROGRESS NOTE   Diagnosis: AML  INTERVAL HISTORY:   Barbara Benitez returns as scheduled.  She feels well.  She is followed closely at [facility] following the allogeneic transplant.  She continues tacrolimus.  She had a recent upper respiratory infection with rhinorrhea and a cough.  The symptoms have improved.  No fever.  She reports a mild rash over the face.  She has mild diarrhea that she relates to taking a vitamin with magnesium.  She has chronic discomfort at the left lower abdomen.  She reports a recent negative trans-vaginal ultrasound.  Objective:  Vital signs in last 24 hours:  Blood pressure (!) 106/47, pulse 73, temperature 98.4 F (36.9 C), temperature source Oral, resp. rate 17, height _0  (1.651 m), weight 123 lb 9.6 oz (56.1 kg), SpO2 100 %.    HEENT: No thrush or ulcers Resp: Lungs clear bilaterally Cardio: Regular rate and rhythm GI: No hepatosplenomegaly, no mass, nontender Vascular: No leg edema  Skin: Mild erythematous rash at the lower anterior chest beneath the breast and at the upper abdomen  Portacath/PICC-without erythema  Lab Results: 03/10/2017 at [facility]: WBC 3.5, Humulin 10.7, platelets 208,000, ANC 1.9  Medications: I have reviewed the patient's current medications.   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 Induction chemotherapy with cytarabine plus idarubicin, 7/3, beginning 07/06/2012 ? Consolidation with high-dose cytarabine for 4 cycles, last cycle 01/18/2013 ? End of treatment bone marrow biopsy 03/04/2013-remission with normal karyotype/FISH ? 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 Induction therapy with high-dose cytarabine/mitoxantrone plus CPI-613on study at [facility] 04/27/2016 ? Nadir bone marrow biopsy with focal blast up to 10%, reinduction 05/02/2016 ? Cycle 1 consolidation therapy with high-dose  cytarabine/mitoxantrone plus CPI-613 06/21/2016 ? Cycle 2 consolidation with high-dose cytarabine, mitoxantrone, and CPI-61307/20/2018 ? 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  Elevated creatinine 12/02/2016 (1.45); Tacrolimus dose reduced, IV fluids administered 12/02/2016 and 12/06/2016.    Disposition: Barbara Benitez appears well.  She is in clinical remission from AML.  She continues close post transplant follow-up at [facility].  She will return for an office visit here in 6 months.  I am available to see her in the interim as needed.  15 minutes were spent with the patient today.  The majority of the time was used for counseling and coordination of care.  Betsy Coder, MD  03/20/2017  12:48 PM

## 2017-03-20 NOTE — Telephone Encounter (Signed)
Scheduled appt per 3/7 los - patient did not want avs or calendar with appts.

## 2017-03-21 ENCOUNTER — Ambulatory Visit
Admission: RE | Admit: 2017-03-21 | Discharge: 2017-03-21 | Disposition: A | Discharge status: Home / Self Care | Payer: BLUE CROSS/BLUE SHIELD | Source: Ambulatory Visit | Attending: Nurse Practitioner | Admitting: Nurse Practitioner

## 2017-03-21 DIAGNOSIS — Z1231 Encounter for screening mammogram for malignant neoplasm of breast: Secondary | ICD-10-CM

## 2017-04-23 ENCOUNTER — Other Ambulatory Visit: Payer: Self-pay | Admitting: Internal Medicine

## 2017-04-23 ENCOUNTER — Ambulatory Visit
Admission: RE | Admit: 2017-04-23 | Discharge: 2017-04-23 | Disposition: A | Discharge status: Home / Self Care | Payer: BLUE CROSS/BLUE SHIELD | Source: Ambulatory Visit | Attending: Internal Medicine | Admitting: Internal Medicine

## 2017-04-23 DIAGNOSIS — E7800 Pure hypercholesterolemia, unspecified: Secondary | ICD-10-CM

## 2017-05-20 ENCOUNTER — Other Ambulatory Visit: Payer: Self-pay | Admitting: Gastroenterology

## 2017-05-20 DIAGNOSIS — R1032 Left lower quadrant pain: Secondary | ICD-10-CM

## 2017-05-23 ENCOUNTER — Ambulatory Visit
Admission: RE | Admit: 2017-05-23 | Discharge: 2017-05-23 | Disposition: A | Discharge status: Home / Self Care | Payer: BLUE CROSS/BLUE SHIELD | Source: Ambulatory Visit | Attending: Gastroenterology | Admitting: Gastroenterology

## 2017-05-23 DIAGNOSIS — R1032 Left lower quadrant pain: Secondary | ICD-10-CM

## 2017-05-23 MED ORDER — IOPAMIDOL (ISOVUE-300) INJECTION 61%
100.0000 mL | Freq: Once | INTRAVENOUS | Status: AC | PRN
  Administered 2017-05-23: 100 mL via INTRAVENOUS

## 2017-09-30 ENCOUNTER — Telehealth: Payer: Self-pay | Admitting: Oncology

## 2017-09-30 ENCOUNTER — Encounter: Payer: Self-pay | Admitting: Oncology

## 2017-09-30 ENCOUNTER — Inpatient Hospital Stay: Payer: BLUE CROSS/BLUE SHIELD | Attending: Oncology | Admitting: Oncology

## 2017-09-30 VITALS — BP 103/63 | HR 69 | Temp 98.0°F | Resp 14 | Ht 65.0 in | Wt 122.8 lb

## 2017-09-30 DIAGNOSIS — D6481 Anemia due to antineoplastic chemotherapy: Secondary | ICD-10-CM | POA: Diagnosis not present

## 2017-09-30 DIAGNOSIS — T451X5A Adverse effect of antineoplastic and immunosuppressive drugs, initial encounter: Secondary | ICD-10-CM

## 2017-09-30 DIAGNOSIS — Z23 Encounter for immunization: Secondary | ICD-10-CM | POA: Insufficient documentation

## 2017-09-30 DIAGNOSIS — D638 Anemia in other chronic diseases classified elsewhere: Secondary | ICD-10-CM

## 2017-09-30 DIAGNOSIS — C9201 Acute myeloblastic leukemia, in remission: Secondary | ICD-10-CM | POA: Diagnosis not present

## 2017-09-30 MED ORDER — INFLUENZA VAC SPLIT QUAD 0.5 ML IM SUSY
PREFILLED_SYRINGE | INTRAMUSCULAR | Status: AC
  Filled 2017-09-30: qty 0.5

## 2017-09-30 MED ORDER — INFLUENZA VAC SPLIT QUAD 0.5 ML IM SUSY
0.5000 mL | PREFILLED_SYRINGE | Freq: Once | INTRAMUSCULAR | Status: AC
  Administered 2017-09-30: 0.5 mL via INTRAMUSCULAR

## 2017-09-30 NOTE — Telephone Encounter (Signed)
Appts scheduled avs/calendar printed per 9/17 los °

## 2017-09-30 NOTE — Progress Notes (Signed)
  [place] OFFICE PROGRESS NOTE   Diagnosis: AML  INTERVAL HISTORY:   Barbara Benitez returns as scheduled.  She feels well.  She continues monthly follow-up with Dr. Nadara Benitez.  She is now off of immunosuppressive therapy.  No rash or diarrhea.  She has occasional discomfort in the left lower abdomen.  This is sometimes present prior to having a bowel movement or urinating.  No consistent pain.  Objective:  Vital signs in last 24 hours:  Blood pressure 103/63, pulse 69, temperature 98 F (36.7 C), temperature source Oral, resp. rate 14, height '5\' 5"'$  (1.651 m), weight 122 lb 12.8 oz (55.7 kg), SpO2 100 %.    HEENT: No thrush or bleeding Resp: Lungs clear bilaterally Cardio: Regular rate and rhythm GI: No hepatosplenomegaly, no mass, nontender Vascular: No leg edema Kin: No rash  Portacath/PICC-without erythema  Lab Results: 09/22/2017 at [facility]: Creatinine 0.66, WBC 6.4, hemoglobin 11.3, platelets 233,000, ANC 3.0  Medications: I have reviewed the patient's current medications.   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 Induction chemotherapy with cytarabine plus idarubicin, 7/3, beginning 07/06/2012 ? Consolidation with high-dose cytarabine for 4 cycles, last cycle 01/18/2013 ? End of treatment bone marrow biopsy 03/04/2013-remission with normal karyotype/FISH ? 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 Induction therapy with high-dose cytarabine/mitoxantrone plus CPI-613on study at [facility] 04/27/2016 ? Nadir bone marrow biopsy with focal blast up to 10%, reinduction 05/02/2016 ? Cycle 1 consolidation therapy with high-dose cytarabine/mitoxantrone plus CPI-613 06/21/2016 ? Cycle 2 consolidation with high-dose cytarabine, mitoxantrone, and CPI-61307/20/2018 ? 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  She continues close clinical and laboratory follow-up with the transplant team at [facility].  She received an influenza vaccine today.  She will return for an office visit in 6 months. She will seek medical attention for consistent abdominal pain.  15 minutes were spent with the patient today.  The majority of the time was used for counseling and coordination of care.  Betsy Coder, MD  09/30/2017  12:10 PM

## 2017-11-12 ENCOUNTER — Other Ambulatory Visit: Payer: Self-pay

## 2017-11-12 ENCOUNTER — Ambulatory Visit: Payer: BLUE CROSS/BLUE SHIELD | Admitting: Neurology

## 2017-11-12 ENCOUNTER — Encounter: Payer: Self-pay | Admitting: Neurology

## 2017-11-12 VITALS — Resp 14 | Ht 65.0 in | Wt 122.0 lb

## 2017-11-12 DIAGNOSIS — M5432 Sciatica, left side: Secondary | ICD-10-CM

## 2017-11-12 NOTE — Progress Notes (Signed)
Reason for visit: Low back pain  Referring physician: [name] is a 62 y.o. female  History of present illness:  Barbara Benitez is a 61 year old right-handed white female with a history of low back pain that dates back to around March 2019.  There were no injuries associated with back pain, the patient does note that yard work seems to worsen her back pain and she does yoga on a regular basis.  Recently, she had a Port-A-Cath removed and she was unable to engage in yoga, and her back pain went away.  The patient has not had any back pain for about 3 weeks.  The pain is usually in the center of the back without radiation down the leg.  The patient goes on to say that she has had greater than a decade of occasional sciatica type pain down the left leg that oftentimes is brought on by prolonged sitting.  The patient has excellent mobility of the back, she had an x-ray of the low back done in April 2019 that was essentially normal.  The patient reports no numbness in the extremities, she does have a tight sensation across the arch of the foot on the left and she has a fatigue sensation in the left leg in the calf muscle with walking.  The patient reports no overt weakness of the extremities.  She denies any problems with the right leg and she denies neck pain or pain down the arms.  She does have some urinary urgency and frequency, she denies issues controlling the bowels.  She is sent to this office for an evaluation.  Past Medical History:  Diagnosis Date  . Allergy   . AML (acute myeloblastic leukemia) ([place]) 2014, 2018  . Asthma   . Blood transfusion without reported diagnosis   . Cancer [facility])     Past Surgical History:  Procedure Laterality Date  . tendon Left    Hand    Family History  Problem Relation Age of Onset  . Hyperlipidemia Mother   . Hypertension Mother   . Dementia Mother   . Heart disease Father   . Colon cancer Father     Social history:  reports that  she has never smoked. She has never used smokeless tobacco. She reports that she does not drink alcohol or use drugs.  Medications:  Prior to Admission medications   Medication Sig Start Date End Date Taking? Authorizing Provider  alendronate (FOSAMAX) 70 MG tablet  09/24/17  Yes [provider]  Biotin 1000 MCG tablet Take 1,000 mcg by mouth 3 (three) times daily.   Yes [provider]  folic acid (FOLVITE) 1 MG tablet Take 1 mg daily by mouth. 10/14/16  Yes [provider]  montelukast (SINGULAIR) 10 MG tablet Take by mouth. 11/03/17  Yes [provider]  Multiple Vitamin (MULTI-VITAMINS) TABS Take by mouth. 06/30/17  Yes [provider]      Allergies  Allergen Reactions  . Amoxicillin Rash  . Other Rash    (1) Transparent dressing - please use Mepilex  . Codeine Nausea Only  . Morphine Nausea And Vomiting  . Percocet [Oxycodone-Acetaminophen] Nausea Only  . Vicodin [Hydrocodone-Acetaminophen] Nausea Only  . Meropenem Rash    CAN NOT TOLERATE AT A FAST PACE CAUSED RASH    ROS:  Out of a complete 14 system review of symptoms, the patient complains only of the following symptoms, and all other reviewed systems are negative.  Aching muscles  Restless legs  Resp. rate 14, height 5\' 5"  (1.651 m), weight 122 lb (55.3 kg).  Physical Exam  General: The patient is alert and cooperative at the time of the examination.  Eyes: Pupils are equal, round, and reactive to light. Discs are flat bilaterally.  Neck: The neck is supple, no carotid bruits are noted.  Respiratory: The respiratory examination is clear.  Cardiovascular: The cardiovascular examination reveals a regular rate and rhythm, no obvious murmurs or rubs are noted.  Neuromuscular: The patient has excellent range of movement of the low back.  Skin: Extremities are without significant edema.  Neurologic Exam  Mental status: The patient is alert and oriented x 3 at the time  of the examination. The patient has apparent normal recent and remote memory, with an apparently normal attention span and concentration ability.  Cranial nerves: Facial symmetry is present. There is good sensation of the face to pinprick and soft touch bilaterally. The strength of the facial muscles and the muscles to head turning and shoulder shrug are normal bilaterally. Speech is well enunciated, no aphasia or dysarthria is noted. Extraocular movements are full. Visual fields are full. The tongue is midline, and the patient has symmetric elevation of the soft palate. No obvious hearing deficits are noted.  Motor: The motor testing reveals 5 over 5 strength of all 4 extremities. Good symmetric motor tone is noted throughout.  Sensory: Sensory testing is intact to pinprick, soft touch, vibration sensation, and position sense on all 4 extremities. No evidence of extinction is noted.  Coordination: Cerebellar testing reveals good finger-nose-finger and heel-to-shin bilaterally.  Gait and station: Gait is normal. Tandem gait is normal. Romberg is negative. No drift is seen.  The patient is able to walk on heels and the toes.  Reflexes: Deep tendon reflexes are symmetric and normal bilaterally. Toes are downgoing bilaterally.   Assessment/Plan:  1.  Chronic low back pain  2.  Episodic left sided sciatica  The patient currently is not having any back pain.  Her clinical examination is completely normal.  X-ray low back done previously was normal.  I would recommend a short course of Advil or Aleve if she does have recurring back pain.  Given the symptoms of fatigue of the left leg, some tightness in the left foot, the patient will undergo nerve conduction studies of both legs and EMG on the left leg.  She will follow-up for the above evaluation.  Jill Alexanders MD 11/12/2017 2:14 PM  [facility] [address] [place], [place] [address]  Phone [phone]  Fax [phone]

## 2017-11-28 ENCOUNTER — Other Ambulatory Visit: Payer: Self-pay | Admitting: Nurse Practitioner

## 2017-11-28 DIAGNOSIS — M810 Age-related osteoporosis without current pathological fracture: Secondary | ICD-10-CM

## 2017-12-09 ENCOUNTER — Other Ambulatory Visit: Payer: Self-pay | Admitting: Nurse Practitioner

## 2017-12-09 ENCOUNTER — Ambulatory Visit
Admission: RE | Admit: 2017-12-09 | Discharge: 2017-12-09 | Disposition: A | Discharge status: Home / Self Care | Payer: BLUE CROSS/BLUE SHIELD | Source: Ambulatory Visit | Attending: Nurse Practitioner | Admitting: Nurse Practitioner

## 2017-12-09 DIAGNOSIS — R509 Fever, unspecified: Secondary | ICD-10-CM

## 2017-12-17 ENCOUNTER — Ambulatory Visit (INDEPENDENT_AMBULATORY_CARE_PROVIDER_SITE_OTHER): Payer: BLUE CROSS/BLUE SHIELD | Admitting: Neurology

## 2017-12-17 ENCOUNTER — Encounter: Payer: Self-pay | Admitting: Neurology

## 2017-12-17 ENCOUNTER — Ambulatory Visit: Payer: BLUE CROSS/BLUE SHIELD | Admitting: Neurology

## 2017-12-17 DIAGNOSIS — M5432 Sciatica, left side: Secondary | ICD-10-CM

## 2017-12-17 DIAGNOSIS — Z0289 Encounter for other administrative examinations: Secondary | ICD-10-CM

## 2017-12-17 NOTE — Progress Notes (Signed)
The patient comes in today for EMG evaluation for left leg pain, sciatica.  The patient claims that the sciatica has gone away at this point.  She still has a tight sensation across the ankle and foot on the left.  Nerve conduction studies were relatively unremarkable, no evidence of a neuropathy is seen.  EMG of the left leg is normal.  The patient will contact our office if the back pain recurs.

## 2017-12-17 NOTE — Procedures (Signed)
     HISTORY:  Barbara Benitez is a 62 year old with a history of sciatica involving the left leg.  The patient denies any weakness of the leg, she does have a tight sensation around the ankle and left foot that is persistent.  She denies any overt pain.  She is being evaluated for possible neuropathy or a radiculopathy.  NERVE CONDUCTION STUDIES:  Nerve conduction studies were performed on both lower extremities. The distal motor latencies and motor amplitudes for the peroneal and posterior tibial nerves were within normal limits. The nerve conduction velocities for these nerves were also normal. The sensory latencies for the peroneal and sural nerves were within normal limits. The F wave latencies for the posterior tibial nerves were within normal limits.   EMG STUDIES:  EMG study was performed on the left lower extremity:  The tibialis anterior muscle reveals 2 to 4K motor units with full recruitment. No fibrillations or positive waves were seen. The peroneus tertius muscle reveals 2 to 4K motor units with full recruitment. No fibrillations or positive waves were seen. The medial gastrocnemius muscle reveals 1 to 3K motor units with full recruitment. No fibrillations or positive waves were seen. The vastus lateralis muscle reveals 2 to 4K motor units with full recruitment. No fibrillations or positive waves were seen. The iliopsoas muscle reveals 2 to 4K motor units with full recruitment. No fibrillations or positive waves were seen. The biceps femoris muscle (long head) reveals 2 to 4K motor units with full recruitment. No fibrillations or positive waves were seen. The lumbosacral paraspinal muscles were tested at 3 levels, and revealed no abnormalities of insertional activity at all 3 levels tested. There was good relaxation.   IMPRESSION:  Nerve conduction studies were performed on both lower extremities and were unremarkable, no evidence of a neuropathy is seen.  EMG evaluation of  the left lower extremity was unremarkable, no evidence of an overlying lumbosacral radiculopathy was seen.  Jill Alexanders MD 12/17/2017 1:40 PM  [facility] [address] [place], [place] [address]  Phone [phone] Fax [phone]

## 2017-12-17 NOTE — Progress Notes (Signed)
Please refer to EMG and nerve conduction procedure note.  

## 2018-01-06 ENCOUNTER — Ambulatory Visit
Admission: RE | Admit: 2018-01-06 | Discharge: 2018-01-06 | Disposition: A | Discharge status: Home / Self Care | Payer: BLUE CROSS/BLUE SHIELD | Source: Ambulatory Visit | Attending: Nurse Practitioner | Admitting: Nurse Practitioner

## 2018-01-06 ENCOUNTER — Other Ambulatory Visit: Payer: Self-pay | Admitting: Nurse Practitioner

## 2018-01-06 DIAGNOSIS — R42 Dizziness and giddiness: Secondary | ICD-10-CM

## 2018-01-21 ENCOUNTER — Telehealth: Payer: Self-pay | Admitting: Oncology

## 2018-01-21 NOTE — Telephone Encounter (Signed)
Patient came and got a print out of her calendar.

## 2018-02-06 ENCOUNTER — Other Ambulatory Visit: Payer: Self-pay | Admitting: Nurse Practitioner

## 2018-02-06 DIAGNOSIS — Z1231 Encounter for screening mammogram for malignant neoplasm of breast: Secondary | ICD-10-CM

## 2018-03-18 ENCOUNTER — Telehealth: Payer: Self-pay | Admitting: Oncology

## 2018-03-18 NOTE — Telephone Encounter (Signed)
Call day 03/17 moved Follow-up to 3/27. Left VM and mailed schedule.

## 2018-03-31 ENCOUNTER — Ambulatory Visit: Payer: BLUE CROSS/BLUE SHIELD | Admitting: Oncology

## 2018-04-09 ENCOUNTER — Telehealth: Payer: Self-pay | Admitting: Oncology

## 2018-04-09 NOTE — Telephone Encounter (Signed)
Scheduled appt per 3/25 sch message - unable to reach patient - left message and sent reminder letter in the mail.   

## 2018-04-10 ENCOUNTER — Ambulatory Visit: Payer: BLUE CROSS/BLUE SHIELD | Admitting: Oncology

## 2018-05-13 ENCOUNTER — Other Ambulatory Visit: Payer: Self-pay | Admitting: *Deleted

## 2018-05-13 DIAGNOSIS — C9201 Acute myeloblastic leukemia, in remission: Secondary | ICD-10-CM

## 2018-05-13 NOTE — Progress Notes (Signed)
Fax from [employer], Therapist, sports at [facility] needed CMP and CBC/diff in May. Added to 06/09/18 visit and patient notified.

## 2018-05-25 ENCOUNTER — Ambulatory Visit: Payer: BLUE CROSS/BLUE SHIELD

## 2018-05-25 ENCOUNTER — Other Ambulatory Visit: Payer: BLUE CROSS/BLUE SHIELD

## 2018-06-02 ENCOUNTER — Telehealth: Payer: Self-pay | Admitting: Oncology

## 2018-06-02 NOTE — Telephone Encounter (Signed)
Per [facility] reschedule list patient to keep lab and f/u to be telephone visit. Left message for patient.

## 2018-06-05 ENCOUNTER — Telehealth: Payer: Self-pay | Admitting: Oncology

## 2018-06-05 NOTE — Telephone Encounter (Signed)
Contacted patient to verify telephone visit for pre reg °

## 2018-06-09 ENCOUNTER — Inpatient Hospital Stay: Payer: Medicare Other

## 2018-06-09 ENCOUNTER — Other Ambulatory Visit: Payer: Self-pay

## 2018-06-09 ENCOUNTER — Inpatient Hospital Stay: Payer: Medicare Other | Attending: Oncology | Admitting: Oncology

## 2018-06-09 DIAGNOSIS — C9201 Acute myeloblastic leukemia, in remission: Secondary | ICD-10-CM | POA: Diagnosis not present

## 2018-06-09 LAB — CBC WITH DIFFERENTIAL (CANCER CENTER ONLY)
Abs Immature Granulocytes: 0.01 10*3/uL (ref 0.00–0.07)
Basophils Absolute: 0 10*3/uL (ref 0.0–0.1)
Basophils Relative: 0 %
Eosinophils Absolute: 0.2 10*3/uL (ref 0.0–0.5)
Eosinophils Relative: 3 %
HCT: 36.8 % (ref 36.0–46.0)
Hemoglobin: 11.8 g/dL — ABNORMAL LOW (ref 12.0–15.0)
Immature Granulocytes: 0 %
Lymphocytes Relative: 46 %
Lymphs Abs: 3.1 10*3/uL (ref 0.7–4.0)
MCH: 30.6 pg (ref 26.0–34.0)
MCHC: 32.1 g/dL (ref 30.0–36.0)
MCV: 95.3 fL (ref 80.0–100.0)
Monocytes Absolute: 0.5 10*3/uL (ref 0.1–1.0)
Monocytes Relative: 7 %
Neutro Abs: 2.9 10*3/uL (ref 1.7–7.7)
Neutrophils Relative %: 44 %
Platelet Count: 261 10*3/uL (ref 150–400)
RBC: 3.86 MIL/uL — ABNORMAL LOW (ref 3.87–5.11)
RDW: 13.9 % (ref 11.5–15.5)
WBC Count: 6.7 10*3/uL (ref 4.0–10.5)
nRBC: 0 % (ref 0.0–0.2)

## 2018-06-09 LAB — CMP (CANCER CENTER ONLY)
ALT: 36 U/L (ref 0–44)
AST: 27 U/L (ref 15–41)
Albumin: 3.7 g/dL (ref 3.5–5.0)
Alkaline Phosphatase: 85 U/L (ref 38–126)
Anion gap: 6 (ref 5–15)
BUN: 19 mg/dL (ref 8–23)
CO2: 29 mmol/L (ref 22–32)
Calcium: 9.4 mg/dL (ref 8.9–10.3)
Chloride: 107 mmol/L (ref 98–111)
Creatinine: 0.73 mg/dL (ref 0.44–1.00)
GFR, Est AFR Am: >60 mL/min (ref >60)
GFR, Estimated: >60 mL/min (ref >60)
Glucose, Bld: 110 mg/dL — ABNORMAL HIGH (ref 70–99)
Potassium: 4 mmol/L (ref 3.5–5.1)
Sodium: 142 mmol/L (ref 135–145)
Total Bilirubin: <0.2 mg/dL — ABNORMAL LOW (ref 0.3–1.2)
Total Protein: 6.9 g/dL (ref 6.5–8.1)

## 2018-06-09 NOTE — Progress Notes (Signed)
  [place] OFFICE VISIT PROGRESS NOTE  I connected with Barbara Benitez on 06/09/18 at 4 PM EDT by  and verified that I am speaking with the correct person.  Patient's location: Home Provider's location: Office   Diagnosis: AML  INTERVAL HISTORY:   Barbara Benitez is seen for a telephone visit in lieu of an in person visit.  This is secondary to the [place] pandemic.  She reports feeling well.  She reports developing "tightness "in the forearms and wrist, partially improved after physical therapy.  No rash or diarrhea.  She is mowing her lawn.  She is wearing a mask with the COVID pandemic.   Lab Results:  Lab Results  Component Value Date   WBC 6.7 06/09/2018   HGB 11.8 (L) 06/09/2018   HCT 36.8 06/09/2018   MCV 95.3 06/09/2018   PLT 261 06/09/2018   NEUTROABS 2.9 06/09/2018     Medications: I have reviewed the patient's current medications.  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 Induction chemotherapy with cytarabine plus idarubicin, 7/3, beginning 07/06/2012 ? Consolidation with high-dose cytarabine for 4 cycles, last cycle 01/18/2013 ? End of treatment bone marrow biopsy 03/04/2013-remission with normal karyotype/FISH ? 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 Induction therapy with high-dose cytarabine/mitoxantrone plus CPI-613on study at [facility] 04/27/2016 ? Nadir bone marrow biopsy with focal blast up to 10%, reinduction 05/02/2016 ? Cycle 1 consolidation therapy with high-dose cytarabine/mitoxantrone plus CPI-613 06/21/2016 ? Cycle 2 consolidation with high-dose cytarabine, mitoxantrone, and CPI-61307/20/2018 ? 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 remains in clinical remission from AML.  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  She is scheduled to see Dr. Nadara Mustard in July.  She will return for an office and lab visit here in 6 months.  We are available to see her in the interim as needed.   I discussed the assessment and treatment plan with the patient. The patient was provided an opportunity to ask questions and all were answered. The patient agreed with the plan and demonstrated an understanding of the instructions.   The patient was advised to call back or seek an in-person evaluation if the symptoms worsen or if the condition fails to improve as anticipated.  I provided 15 minutes of telephone and documentation time during this encounter, and > 50% was spent counseling as documented under my assessment & plan.  Betsy Coder ANP/GNP-BC   06/09/2018 4:07 PM

## 2018-06-10 ENCOUNTER — Telehealth: Payer: Self-pay | Admitting: *Deleted

## 2018-06-10 ENCOUNTER — Telehealth: Payer: Self-pay | Admitting: Oncology

## 2018-06-10 NOTE — Telephone Encounter (Signed)
Faxed labs from 06/09/18 to Dr. Nadara Mustard at [facility] [phone].

## 2018-06-10 NOTE — Telephone Encounter (Signed)
Scheduled appt per 5/26 los. ° °A calendar will be mailed out. °

## 2018-06-19 ENCOUNTER — Inpatient Hospital Stay: Admission: RE | Admit: 2018-06-19 | Payer: Self-pay | Source: Ambulatory Visit

## 2018-07-11 ENCOUNTER — Ambulatory Visit
Admission: RE | Admit: 2018-07-11 | Discharge: 2018-07-11 | Disposition: A | Discharge status: Home / Self Care | Payer: Medicare Other | Source: Ambulatory Visit | Attending: Nurse Practitioner | Admitting: Nurse Practitioner

## 2018-07-11 ENCOUNTER — Other Ambulatory Visit: Payer: Self-pay

## 2018-07-11 DIAGNOSIS — Z1231 Encounter for screening mammogram for malignant neoplasm of breast: Secondary | ICD-10-CM

## 2018-07-21 ENCOUNTER — Other Ambulatory Visit: Payer: Self-pay

## 2018-07-21 ENCOUNTER — Ambulatory Visit
Admission: RE | Admit: 2018-07-21 | Discharge: 2018-07-21 | Disposition: A | Discharge status: Home / Self Care | Payer: Medicare Other | Source: Ambulatory Visit | Attending: Nurse Practitioner | Admitting: Nurse Practitioner

## 2018-07-21 DIAGNOSIS — M810 Age-related osteoporosis without current pathological fracture: Secondary | ICD-10-CM

## 2018-12-08 ENCOUNTER — Telehealth: Payer: Self-pay | Admitting: Oncology

## 2018-12-08 NOTE — Telephone Encounter (Signed)
Called patient per providers request, patent agreed to reschedule 3 months out. 11/30 appointment has moved to 02/25.

## 2018-12-14 ENCOUNTER — Ambulatory Visit: Payer: Medicare Other | Admitting: Oncology

## 2019-02-10 ENCOUNTER — Ambulatory Visit: Payer: Medicare Other

## 2019-02-18 ENCOUNTER — Ambulatory Visit: Payer: Medicare Other

## 2019-03-03 ENCOUNTER — Ambulatory Visit: Payer: Medicare Other

## 2019-03-11 ENCOUNTER — Inpatient Hospital Stay: Payer: Medicare Other | Attending: Oncology | Admitting: Oncology

## 2019-03-11 ENCOUNTER — Other Ambulatory Visit: Payer: Self-pay

## 2019-03-11 VITALS — BP 107/49 | HR 61 | Temp 97.8°F | Resp 18 | Wt 120.9 lb

## 2019-03-11 DIAGNOSIS — C9200 Acute myeloblastic leukemia, not having achieved remission: Secondary | ICD-10-CM | POA: Diagnosis not present

## 2019-03-11 DIAGNOSIS — D6481 Anemia due to antineoplastic chemotherapy: Secondary | ICD-10-CM | POA: Insufficient documentation

## 2019-03-11 DIAGNOSIS — T451X5A Adverse effect of antineoplastic and immunosuppressive drugs, initial encounter: Secondary | ICD-10-CM | POA: Diagnosis not present

## 2019-03-11 DIAGNOSIS — C9201 Acute myeloblastic leukemia, in remission: Secondary | ICD-10-CM

## 2019-03-11 NOTE — Progress Notes (Signed)
  [place] OFFICE PROGRESS NOTE   Diagnosis: AML  INTERVAL HISTORY:   Barbara Benitez feels well.  No rash.  No recent infection.  She is no longer taking immunosuppressive therapy.  She has received the first dose of the COVID-19 vaccine. Joint stiffness joint stiffness improved after physical therapy.  Objective:  Vital signs in last 24 hours:  Blood pressure (!) 107/49, pulse 61, temperature 97.8 F (36.6 C), temperature source Temporal, resp. rate 18, weight 120 lb 14.4 oz (54.8 kg), SpO2 100 %.    GI: no hepatosplenomegaly Vascular: no leg edema  Skin: no rash   Portacath/PICC-without erythema  Lab Results: Labs from [facility] on 10/29/2018: Hemoglobin 12.5, platelets 259,000, WBC 4.4, ANC 2.7, creatinine 0.67, alkaline phosphatase 73, AST 20, ALT 21, bilirubin 0.3  Medications: I have reviewed the patient's current medications   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 Induction chemotherapy with cytarabine plus idarubicin, 7/3, beginning 07/06/2012 ? Consolidation with high-dose cytarabine for 4 cycles, last cycle 01/18/2013 ? End of treatment bone marrow biopsy 03/04/2013-remission with normal karyotype/FISH ? 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 Induction therapy with high-dose cytarabine/mitoxantrone plus CPI-613on study at [facility] 04/27/2016 ? Nadir bone marrow biopsy with focal blast up to 10%, reinduction 05/02/2016 ? Cycle 1 consolidation therapy with high-dose cytarabine/mitoxantrone plus CPI-613 06/21/2016 ? Cycle 2 consolidation with high-dose cytarabine, mitoxantrone, and CPI-61307/20/2018 ? 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  She  continues clinical and laboratory follow-up at [facility]  She will return for an office visit in 1 year.  I am available to see her in the interim as needed.  Betsy Coder, MD  03/11/2019  1:26 PM

## 2019-06-04 ENCOUNTER — Other Ambulatory Visit: Payer: Self-pay | Admitting: Obstetrics and Gynecology

## 2019-06-04 DIAGNOSIS — Z1231 Encounter for screening mammogram for malignant neoplasm of breast: Secondary | ICD-10-CM

## 2019-07-12 ENCOUNTER — Ambulatory Visit
Admission: RE | Admit: 2019-07-12 | Discharge: 2019-07-12 | Disposition: A | Discharge status: Home / Self Care | Payer: Medicare Other | Source: Ambulatory Visit | Attending: Obstetrics and Gynecology | Admitting: Obstetrics and Gynecology

## 2019-07-12 ENCOUNTER — Other Ambulatory Visit: Payer: Self-pay

## 2019-07-12 DIAGNOSIS — Z1231 Encounter for screening mammogram for malignant neoplasm of breast: Secondary | ICD-10-CM

## 2019-08-10 ENCOUNTER — Other Ambulatory Visit: Payer: Self-pay | Admitting: Nurse Practitioner

## 2019-08-10 DIAGNOSIS — R140 Abdominal distension (gaseous): Secondary | ICD-10-CM

## 2019-08-11 ENCOUNTER — Other Ambulatory Visit: Payer: Self-pay | Admitting: Nurse Practitioner

## 2019-08-11 DIAGNOSIS — M810 Age-related osteoporosis without current pathological fracture: Secondary | ICD-10-CM

## 2019-08-23 ENCOUNTER — Ambulatory Visit
Admission: RE | Admit: 2019-08-23 | Discharge: 2019-08-23 | Disposition: A | Discharge status: Home / Self Care | Payer: Medicare Other | Source: Ambulatory Visit | Attending: Nurse Practitioner | Admitting: Nurse Practitioner

## 2019-08-23 DIAGNOSIS — R140 Abdominal distension (gaseous): Secondary | ICD-10-CM

## 2019-08-23 MED ORDER — IOPAMIDOL (ISOVUE-300) INJECTION 61%
100.0000 mL | Freq: Once | INTRAVENOUS | Status: AC | PRN
  Administered 2019-08-23: 100 mL via INTRAVENOUS

## 2019-10-05 ENCOUNTER — Ambulatory Visit
Admission: RE | Admit: 2019-10-05 | Discharge: 2019-10-05 | Disposition: A | Discharge status: Home / Self Care | Payer: Medicare Other | Source: Ambulatory Visit | Attending: Nurse Practitioner | Admitting: Nurse Practitioner

## 2019-10-05 ENCOUNTER — Other Ambulatory Visit: Payer: Self-pay

## 2019-10-05 DIAGNOSIS — M810 Age-related osteoporosis without current pathological fracture: Secondary | ICD-10-CM

## 2020-03-06 DIAGNOSIS — L570 Actinic keratosis: Secondary | ICD-10-CM | POA: Diagnosis not present

## 2020-03-06 DIAGNOSIS — D2272 Melanocytic nevi of left lower limb, including hip: Secondary | ICD-10-CM | POA: Diagnosis not present

## 2020-03-06 DIAGNOSIS — D1801 Hemangioma of skin and subcutaneous tissue: Secondary | ICD-10-CM | POA: Diagnosis not present

## 2020-03-06 DIAGNOSIS — L814 Other melanin hyperpigmentation: Secondary | ICD-10-CM | POA: Diagnosis not present

## 2020-03-06 DIAGNOSIS — Q825 Congenital non-neoplastic nevus: Secondary | ICD-10-CM | POA: Diagnosis not present

## 2020-03-06 DIAGNOSIS — L821 Other seborrheic keratosis: Secondary | ICD-10-CM | POA: Diagnosis not present

## 2020-03-06 DIAGNOSIS — D485 Neoplasm of uncertain behavior of skin: Secondary | ICD-10-CM | POA: Diagnosis not present

## 2020-03-09 ENCOUNTER — Inpatient Hospital Stay: Payer: Medicare Other | Attending: Oncology | Admitting: Oncology

## 2020-03-09 ENCOUNTER — Other Ambulatory Visit: Payer: Self-pay

## 2020-03-09 VITALS — BP 123/68 | HR 72 | Temp 97.6°F | Resp 20 | Ht 65.0 in | Wt 122.7 lb

## 2020-03-09 DIAGNOSIS — C9201 Acute myeloblastic leukemia, in remission: Secondary | ICD-10-CM

## 2020-03-09 NOTE — Progress Notes (Signed)
  [place] OFFICE PROGRESS NOTE   Diagnosis: AML  INTERVAL HISTORY:   Barbara Benitez returns as scheduled.  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  She feels well.  She has received the COVID-19 and influenza vaccines.  No rash or diarrhea.  Objective:  Vital signs in last 24 hours:  Blood pressure 123/68, pulse 72, temperature 97.6 F (36.4 C), temperature source Tympanic, resp. rate 20, height $RemoveBe'5\' 5"'GEYpIjihK$  (1.651 m), weight 122 lb 11.2 oz (55.7 kg), SpO2 98 %.   Resp: Lungs clear bilaterally Cardio: Regular rate and rhythm GI: No hepatosplenomegaly Vascular: No leg edema   Lab Results:  Lab Results  Component Value Date   WBC 6.7 06/09/2018   HGB 11.8 (L) 06/09/2018   HCT 36.8 06/09/2018   MCV 95.3 06/09/2018   PLT 261 06/09/2018   NEUTROABS 2.9 06/09/2018   CBC at [facility] on 10 1 2021: Hemoban 12.8, WBC 4.1, platelets 230,000, ANC 1.8 creatinine 0.66 Medications: I have reviewed the patient's current medications.   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 Induction chemotherapy with cytarabine plus idarubicin, 7/3, beginning 07/06/2012 ? Consolidation with high-dose cytarabine for 4 cycles, last cycle 01/18/2013 ? End of treatment bone marrow biopsy 03/04/2013-remission with normal karyotype/FISH ? 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 Induction therapy with high-dose cytarabine/mitoxantrone plus CPI-613on study at [facility] 04/27/2016 ? Nadir bone marrow biopsy with focal blast up to 10%, reinduction 05/02/2016 ? Cycle 1 consolidation therapy with high-dose cytarabine/mitoxantrone plus CPI-613 06/21/2016 ? Cycle 2 consolidation with high-dose cytarabine, mitoxantrone, and CPI-61307/20/2018 ? 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  She has a good prognosis for a long-term disease-free survival.  She will continue follow-up with Dr. Nadara Mustard and Dr. Inda Merlin.  She is scheduled for an appointment at [facility] in April.  She will discuss the indication for Evusheld with Dr. Nadara Mustard.  Barbara Benitez was discharged from the oncology clinic today.  I am available to see her in the future as needed.  Betsy Coder, MD  03/09/2020  12:53 PM

## 2020-04-17 DIAGNOSIS — C9201 Acute myeloblastic leukemia, in remission: Secondary | ICD-10-CM | POA: Diagnosis not present

## 2020-04-17 DIAGNOSIS — Z9221 Personal history of antineoplastic chemotherapy: Secondary | ICD-10-CM | POA: Diagnosis not present

## 2020-04-17 DIAGNOSIS — M19012 Primary osteoarthritis, left shoulder: Secondary | ICD-10-CM | POA: Diagnosis not present

## 2020-04-17 DIAGNOSIS — Z9481 Bone marrow transplant status: Secondary | ICD-10-CM | POA: Diagnosis not present

## 2020-04-17 DIAGNOSIS — Z08 Encounter for follow-up examination after completed treatment for malignant neoplasm: Secondary | ICD-10-CM | POA: Diagnosis not present

## 2020-04-17 DIAGNOSIS — Z9484 Stem cells transplant status: Secondary | ICD-10-CM | POA: Diagnosis not present

## 2020-04-27 DIAGNOSIS — K219 Gastro-esophageal reflux disease without esophagitis: Secondary | ICD-10-CM | POA: Diagnosis not present

## 2020-04-27 DIAGNOSIS — Z800 Family history of malignant neoplasm of digestive organs: Secondary | ICD-10-CM | POA: Diagnosis not present

## 2020-04-27 DIAGNOSIS — R1013 Epigastric pain: Secondary | ICD-10-CM | POA: Diagnosis not present

## 2020-06-02 ENCOUNTER — Other Ambulatory Visit: Payer: Self-pay | Admitting: Obstetrics and Gynecology

## 2020-06-02 DIAGNOSIS — Z1231 Encounter for screening mammogram for malignant neoplasm of breast: Secondary | ICD-10-CM

## 2020-07-03 DIAGNOSIS — L738 Other specified follicular disorders: Secondary | ICD-10-CM | POA: Diagnosis not present

## 2020-07-03 DIAGNOSIS — L821 Other seborrheic keratosis: Secondary | ICD-10-CM | POA: Diagnosis not present

## 2020-07-03 DIAGNOSIS — L720 Epidermal cyst: Secondary | ICD-10-CM | POA: Diagnosis not present

## 2020-07-03 DIAGNOSIS — L570 Actinic keratosis: Secondary | ICD-10-CM | POA: Diagnosis not present

## 2020-07-03 DIAGNOSIS — D1801 Hemangioma of skin and subcutaneous tissue: Secondary | ICD-10-CM | POA: Diagnosis not present

## 2020-07-10 DIAGNOSIS — H5203 Hypermetropia, bilateral: Secondary | ICD-10-CM | POA: Diagnosis not present

## 2020-07-10 DIAGNOSIS — H524 Presbyopia: Secondary | ICD-10-CM | POA: Diagnosis not present

## 2020-07-10 DIAGNOSIS — H35373 Puckering of macula, bilateral: Secondary | ICD-10-CM | POA: Diagnosis not present

## 2020-07-10 DIAGNOSIS — H2513 Age-related nuclear cataract, bilateral: Secondary | ICD-10-CM | POA: Diagnosis not present

## 2020-07-13 ENCOUNTER — Other Ambulatory Visit: Payer: Self-pay

## 2020-07-13 ENCOUNTER — Ambulatory Visit
Admission: RE | Admit: 2020-07-13 | Discharge: 2020-07-13 | Disposition: A | Discharge status: Home / Self Care | Payer: Medicare Other | Source: Ambulatory Visit | Attending: Obstetrics and Gynecology | Admitting: Obstetrics and Gynecology

## 2020-07-13 DIAGNOSIS — Z1231 Encounter for screening mammogram for malignant neoplasm of breast: Secondary | ICD-10-CM

## 2020-08-10 DIAGNOSIS — M810 Age-related osteoporosis without current pathological fracture: Secondary | ICD-10-CM | POA: Diagnosis not present

## 2020-08-14 DIAGNOSIS — Z1211 Encounter for screening for malignant neoplasm of colon: Secondary | ICD-10-CM | POA: Diagnosis not present

## 2020-08-14 DIAGNOSIS — K3189 Other diseases of stomach and duodenum: Secondary | ICD-10-CM | POA: Diagnosis not present

## 2020-08-14 DIAGNOSIS — K2930 Chronic superficial gastritis without bleeding: Secondary | ICD-10-CM | POA: Diagnosis not present

## 2020-08-14 DIAGNOSIS — Z800 Family history of malignant neoplasm of digestive organs: Secondary | ICD-10-CM | POA: Diagnosis not present

## 2020-08-14 DIAGNOSIS — K449 Diaphragmatic hernia without obstruction or gangrene: Secondary | ICD-10-CM | POA: Diagnosis not present

## 2020-08-14 DIAGNOSIS — K6289 Other specified diseases of anus and rectum: Secondary | ICD-10-CM | POA: Diagnosis not present

## 2020-08-14 DIAGNOSIS — K529 Noninfective gastroenteritis and colitis, unspecified: Secondary | ICD-10-CM | POA: Diagnosis not present

## 2020-08-14 DIAGNOSIS — K2980 Duodenitis without bleeding: Secondary | ICD-10-CM | POA: Diagnosis not present

## 2020-08-14 DIAGNOSIS — R1013 Epigastric pain: Secondary | ICD-10-CM | POA: Diagnosis not present

## 2020-08-14 DIAGNOSIS — K626 Ulcer of anus and rectum: Secondary | ICD-10-CM | POA: Diagnosis not present

## 2020-08-14 DIAGNOSIS — K222 Esophageal obstruction: Secondary | ICD-10-CM | POA: Diagnosis not present

## 2020-08-18 DIAGNOSIS — K2930 Chronic superficial gastritis without bleeding: Secondary | ICD-10-CM | POA: Diagnosis not present

## 2020-08-18 DIAGNOSIS — K529 Noninfective gastroenteritis and colitis, unspecified: Secondary | ICD-10-CM | POA: Diagnosis not present

## 2020-08-18 DIAGNOSIS — K2980 Duodenitis without bleeding: Secondary | ICD-10-CM | POA: Diagnosis not present

## 2020-09-01 DIAGNOSIS — J3089 Other allergic rhinitis: Secondary | ICD-10-CM | POA: Diagnosis not present

## 2020-09-01 DIAGNOSIS — J301 Allergic rhinitis due to pollen: Secondary | ICD-10-CM | POA: Diagnosis not present

## 2020-09-01 DIAGNOSIS — R059 Cough, unspecified: Secondary | ICD-10-CM | POA: Diagnosis not present

## 2020-09-01 DIAGNOSIS — K219 Gastro-esophageal reflux disease without esophagitis: Secondary | ICD-10-CM | POA: Diagnosis not present

## 2020-10-11 DIAGNOSIS — K449 Diaphragmatic hernia without obstruction or gangrene: Secondary | ICD-10-CM | POA: Diagnosis not present

## 2020-10-11 DIAGNOSIS — R1032 Left lower quadrant pain: Secondary | ICD-10-CM | POA: Diagnosis not present

## 2020-10-11 DIAGNOSIS — K6289 Other specified diseases of anus and rectum: Secondary | ICD-10-CM | POA: Diagnosis not present

## 2020-10-11 DIAGNOSIS — R079 Chest pain, unspecified: Secondary | ICD-10-CM | POA: Diagnosis not present

## 2020-10-20 DIAGNOSIS — C9201 Acute myeloblastic leukemia, in remission: Secondary | ICD-10-CM | POA: Diagnosis not present

## 2020-10-20 DIAGNOSIS — C9202 Acute myeloblastic leukemia, in relapse: Secondary | ICD-10-CM | POA: Diagnosis not present

## 2020-10-20 DIAGNOSIS — Z23 Encounter for immunization: Secondary | ICD-10-CM | POA: Diagnosis not present

## 2020-10-20 DIAGNOSIS — Z79631 Long term (current) use of antimetabolite agent: Secondary | ICD-10-CM | POA: Diagnosis not present

## 2020-10-20 DIAGNOSIS — R319 Hematuria, unspecified: Secondary | ICD-10-CM | POA: Diagnosis not present

## 2020-10-20 DIAGNOSIS — Z79632 Long term (current) use of antitumor antibiotic: Secondary | ICD-10-CM | POA: Diagnosis not present

## 2020-10-20 DIAGNOSIS — M810 Age-related osteoporosis without current pathological fracture: Secondary | ICD-10-CM | POA: Diagnosis not present

## 2020-10-20 DIAGNOSIS — Z9484 Stem cells transplant status: Secondary | ICD-10-CM | POA: Diagnosis not present

## 2020-10-20 DIAGNOSIS — R0789 Other chest pain: Secondary | ICD-10-CM | POA: Diagnosis not present

## 2020-10-20 DIAGNOSIS — Z9481 Bone marrow transplant status: Secondary | ICD-10-CM | POA: Diagnosis not present

## 2020-10-26 ENCOUNTER — Other Ambulatory Visit: Payer: Self-pay | Admitting: Internal Medicine

## 2020-10-26 DIAGNOSIS — K6289 Other specified diseases of anus and rectum: Secondary | ICD-10-CM | POA: Diagnosis not present

## 2020-10-26 DIAGNOSIS — R079 Chest pain, unspecified: Secondary | ICD-10-CM | POA: Diagnosis not present

## 2020-10-26 DIAGNOSIS — R1032 Left lower quadrant pain: Secondary | ICD-10-CM | POA: Diagnosis not present

## 2020-10-26 DIAGNOSIS — K449 Diaphragmatic hernia without obstruction or gangrene: Secondary | ICD-10-CM | POA: Diagnosis not present

## 2020-11-06 ENCOUNTER — Encounter: Payer: Self-pay | Admitting: Registered"

## 2020-11-06 ENCOUNTER — Other Ambulatory Visit: Payer: Self-pay

## 2020-11-06 ENCOUNTER — Encounter: Payer: Medicare Other | Attending: Gastroenterology | Admitting: Registered"

## 2020-11-06 DIAGNOSIS — K5120 Ulcerative (chronic) proctitis without complications: Secondary | ICD-10-CM | POA: Insufficient documentation

## 2020-11-06 DIAGNOSIS — Z713 Dietary counseling and surveillance: Secondary | ICD-10-CM | POA: Insufficient documentation

## 2020-11-06 NOTE — Patient Instructions (Addendum)
Talk to your doctor about how to identify if you are having a flare so you know if you need to reduce fiber.  Continue eating balanced meals Avoid going too long without eating. Take snacks with you when out doing errands. Snacks before bed are fine if you are hungry.  Aim to drink more water (fluid)  Keep a food diary and pay extra attention to the following foods: Carbonated drinks Dried beans, peas, and legumes Dried fruits Foods that have sulfur or sulfate Foods high in fiber Meat Nuts and crunchy nut butters Popcorn Products that have sorbitol (sugar-free gum and candies) Raw fruits and vegetables Refined sugar Seeds Spicy foods

## 2020-11-06 NOTE — Progress Notes (Signed)
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 Unintentional weight loss ~5 lbs, usual body weight 120 lbs per patient.  Lifestyle & Dietary Hx Pt states she does not have typical sxs of UC and states her MD has not decided for sure about the diagnosis. However pt states she was told some testing shows inflammation confined to the rectum. Pt was told to watch for mucus and/or blood in her stool.  Pt states she eats healthy and not sure if she should change her diet.  Pt reports a history of food-triggered nausea but resolved with use of OTC FD guard.  Estimated daily fluid intake: mainly water 32 oz Supplements: vitamins Sleep: ~8 Stress / self-care: 3/10 (reduced after remodeling kitchen last summer) Current average weekly physical activity: daily walking 30 minutes  24-Hr Dietary Recall First Meal: oatmeal, wild blue berries, almonds, 1/2 apple, greek yogurt OR boiled egg with ezekiel bread Snack: none Second Meal: peanut butter & jelly on ww bread OR cooked beans with triscuits OR tuna, crackers, olives Snack: none Third Meal: baked chicken or fish, salad, roasted vegetables OR ground [place] chicken Snack: none Beverages: water  Estimated Energy Needs Calories: 1800 (may need more than calculated for BMI due to level of active ADLs)   NUTRITION  DIAGNOSIS  NB-1.1 Food and nutrition-related knowledge deficit As related to role of fiber in UC.  As evidenced by pt stated confusion by her search for information prior to visit.   NUTRITION INTERVENTION  Nutrition education (E-1) on the following topics:  Fiber role in UC Nutrient absorption probably not affected by inflammation located in rectum Types of fat  Prebiotic vs probiotic foods Role of snacks in diet Balanced diet  Handouts Provided Include  Types of fat MNT for UC from [employer] Teaching method utilized: Visual & Auditory  Demonstrated degree of understanding via: Teach Back  Barriers to learning/adherence to lifestyle change: none  Goals Established by Pt Keep a food/sxs diary Talk to MD about sxs of UC flares Take snacks with you when out running errands   MONITORING & EVALUATION Dietary intake, weekly physical activity, and UC sxs in prn.  Next Steps  Patient is to call and make a follow-up appt if continues to lose weight or if after seeing MD in a few weeks feels she needs further nutrition counseling.

## 2020-11-07 ENCOUNTER — Other Ambulatory Visit: Payer: Self-pay | Admitting: Nurse Practitioner

## 2020-11-07 DIAGNOSIS — N6459 Other signs and symptoms in breast: Secondary | ICD-10-CM | POA: Diagnosis not present

## 2020-11-14 ENCOUNTER — Encounter: Payer: Self-pay | Admitting: Oncology

## 2020-11-14 ENCOUNTER — Ambulatory Visit
Admission: RE | Admit: 2020-11-14 | Discharge: 2020-11-14 | Disposition: A | Discharge status: Home / Self Care | Payer: No Typology Code available for payment source | Source: Ambulatory Visit | Attending: Internal Medicine | Admitting: Internal Medicine

## 2020-11-14 ENCOUNTER — Other Ambulatory Visit: Payer: Self-pay

## 2020-11-14 DIAGNOSIS — R079 Chest pain, unspecified: Secondary | ICD-10-CM

## 2020-11-27 ENCOUNTER — Ambulatory Visit
Admission: RE | Admit: 2020-11-27 | Discharge: 2020-11-27 | Disposition: A | Discharge status: Home / Self Care | Payer: Medicare Other | Source: Ambulatory Visit | Attending: Nurse Practitioner | Admitting: Nurse Practitioner

## 2020-11-27 ENCOUNTER — Other Ambulatory Visit: Payer: Self-pay

## 2020-11-27 DIAGNOSIS — N6459 Other signs and symptoms in breast: Secondary | ICD-10-CM

## 2020-11-27 DIAGNOSIS — R922 Inconclusive mammogram: Secondary | ICD-10-CM | POA: Diagnosis not present

## 2020-12-01 DIAGNOSIS — R1013 Epigastric pain: Secondary | ICD-10-CM | POA: Diagnosis not present

## 2020-12-01 DIAGNOSIS — K6289 Other specified diseases of anus and rectum: Secondary | ICD-10-CM | POA: Diagnosis not present

## 2020-12-13 ENCOUNTER — Other Ambulatory Visit: Payer: Self-pay | Admitting: Nurse Practitioner

## 2020-12-13 DIAGNOSIS — N6459 Other signs and symptoms in breast: Secondary | ICD-10-CM

## 2020-12-13 DIAGNOSIS — K6289 Other specified diseases of anus and rectum: Secondary | ICD-10-CM | POA: Diagnosis not present

## 2020-12-13 DIAGNOSIS — Z856 Personal history of leukemia: Secondary | ICD-10-CM | POA: Diagnosis not present

## 2020-12-13 DIAGNOSIS — E7800 Pure hypercholesterolemia, unspecified: Secondary | ICD-10-CM | POA: Diagnosis not present

## 2020-12-13 DIAGNOSIS — R1032 Left lower quadrant pain: Secondary | ICD-10-CM | POA: Diagnosis not present

## 2020-12-13 DIAGNOSIS — E559 Vitamin D deficiency, unspecified: Secondary | ICD-10-CM | POA: Diagnosis not present

## 2020-12-13 DIAGNOSIS — R079 Chest pain, unspecified: Secondary | ICD-10-CM | POA: Diagnosis not present

## 2020-12-13 DIAGNOSIS — Z0001 Encounter for general adult medical examination with abnormal findings: Secondary | ICD-10-CM | POA: Diagnosis not present

## 2020-12-18 ENCOUNTER — Other Ambulatory Visit: Payer: Self-pay

## 2020-12-18 ENCOUNTER — Encounter: Payer: Self-pay | Admitting: Registered"

## 2020-12-18 ENCOUNTER — Encounter: Payer: Medicare Other | Attending: Gastroenterology | Admitting: Registered"

## 2020-12-18 DIAGNOSIS — K51219 Ulcerative (chronic) proctitis with unspecified complications: Secondary | ICD-10-CM | POA: Diagnosis not present

## 2020-12-18 NOTE — Patient Instructions (Addendum)
Consider adding more fats/oils in the diet to increase calories. Instead boiled eggs, could cook with oil or butter, maybe add cheese.  Perfect Protein (peanut butter bars in refrigerator), Go Macro, protein powders are fine whey mixes well and absorption.  Use the handout for ideas for foods that can add calories to your diet.  Have a homemade protein shake in the afternoon.  Avoid drinking with meals, or carbonated drinks.  Yummy recipe: [email or web address]://www.king-[email or web address]/

## 2020-12-18 NOTE — Progress Notes (Signed)
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 (Mostly resolved) Unintentional weight loss ~5 lbs, usual body weight 120 lbs per patient. (No significant weight change since last visit)  Lifestyle & Dietary Hx Pt states at last MD visit was confirmed that she does not have UC, but instead a mild case of proctitis. Pt states she needs to watch out for blood in the stool, urgency, and mucus.  Pt states she has not had any sxs since last visit. Just some reflux occasionally.  Pt states she enjoys eating and cooking. Pt states sometimes does not eat when hungry or to fullness because doing other things.  Estimated daily fluid intake: water 32 oz Supplements: vitamins Sleep: ~8 Stress / self-care: 3/10 (not assessed this visit) Current average weekly physical activity: daily walking 30 minutes  24-Hr Dietary Recall First Meal: oatmeal, raisin, boiled egg with ezekiel bread, water Snack: none Second Meal: peanut butter & jelly on ww bread  Snack: none kieffer  Third Meal: chickpea pasta, beans, green beans, peas, collards with ham. Snack: 2 sugar cookies Beverages: water  Estimated Energy Needs Calories: 1800 (may need more than calculated for BMI due to level of activity with  ADLs and desired weight gain)   NUTRITION DIAGNOSIS  NB-1.1 Food and nutrition-related knowledge deficit As related to role of fiber in UC.  As evidenced by pt stated confusion by her search for information prior to visit.   NUTRITION INTERVENTION  Nutrition education (E-1) on the following topics:  Strategies for weight gain Sources of unsaturated vs saturated fats  Handouts Provided Include  AND High-Calorie, High-Protein Nutrition Therapy  Learning Style & Readiness for Change Teaching method utilized: Visual & Auditory  Demonstrated degree of understanding via: Teach Back  Barriers to learning/adherence to lifestyle change: none  Goals Established by Pt onsider adding more fats/oils in the diet to increase calories. Instead boiled eggs, could cook with oil or butter, maybe add cheese.  Perfect Protein (peanut butter bars in refrigerator), Go Macro, protein powders are fine whey mixes well and absorption.  Use the handout for ideas for foods that can add calories to your diet.  Have a homemade protein shake in the afternoon.  Avoid drinking with meals, or carbonated drinks.  Yummy recipe: https://www.king-greer.com/    MONITORING & EVALUATION Dietary intake, weekly physical activity, and weight in 2 months

## 2021-01-02 ENCOUNTER — Ambulatory Visit
Admission: RE | Admit: 2021-01-02 | Discharge: 2021-01-02 | Disposition: A | Discharge status: Home / Self Care | Payer: Medicare Other | Source: Ambulatory Visit | Attending: Nurse Practitioner | Admitting: Nurse Practitioner

## 2021-01-02 DIAGNOSIS — N6459 Other signs and symptoms in breast: Secondary | ICD-10-CM

## 2021-01-02 DIAGNOSIS — N6453 Retraction of nipple: Secondary | ICD-10-CM | POA: Diagnosis not present

## 2021-01-02 MED ORDER — GADOBUTROL 1 MMOL/ML IV SOLN
5.0000 mL | Freq: Once | INTRAVENOUS | Status: AC | PRN
  Administered 2021-01-02: 18:00:00 5 mL via INTRAVENOUS

## 2021-03-05 ENCOUNTER — Encounter: Payer: Medicare Other | Attending: Gastroenterology | Admitting: Registered"

## 2021-03-05 ENCOUNTER — Ambulatory Visit: Payer: Medicare Other | Admitting: Registered"

## 2021-03-05 ENCOUNTER — Other Ambulatory Visit: Payer: Self-pay

## 2021-03-05 ENCOUNTER — Encounter: Payer: Self-pay | Admitting: Registered"

## 2021-03-05 VITALS — Wt 123.3 lb

## 2021-03-05 DIAGNOSIS — Z713 Dietary counseling and surveillance: Secondary | ICD-10-CM | POA: Diagnosis not present

## 2021-03-05 DIAGNOSIS — K51219 Ulcerative (chronic) proctitis with unspecified complications: Secondary | ICD-10-CM | POA: Diagnosis not present

## 2021-03-05 NOTE — Patient Instructions (Addendum)
Avoid skipping meals. Set an alarm if you are going to be involved in something that may make you forget to eat.  Try out the snacks that you brought today, they all look like handy ways to get snacks in between meals.  Continue having a balanced diet including whole grains, fruits & vegetables, healthy fats and lean protein.  Supplements could add value to your health goals. If your B12 levels are low supplementation is recommended. This would be good to check in with your doctor to see if a lab would be needed or whether or not a supplement is needed.  Calcium and magnesium are important and supplements could be beneficial. Look for an email in the next week or so from me about the different types.

## 2021-03-05 NOTE — Progress Notes (Signed)
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 (Mostly resolved)  Unintentional weight loss ~5 lbs, usual body weight 120 lbs per patient. 6 lbs weight gain in 2 months.  Lifestyle & Dietary Hx Pt states part of what explains her weight gain is recent trip visiting friends, eating out and friend's husband is a good cook. Patient changed dairy products from Fat Free to 2%. Patient states sometimes she gets busy and forgets to eat, and it may triggers nausea and/or headaches.  Pt brought protein bars and snack bars in to get RD opinion on. They all looked like good options to eat in between meals.  Pt has questions about supplements: B12 vs B complex; different types of calcium and magnesium, Collagen for hair, skin & nails. Also had questions regarding fish, [place] vs wild caught.  Estimated daily fluid intake: water 32 oz Supplements: vitamins Sleep: ~8 Stress / self-care: 3/10 (not assessed this visit) Current average weekly physical activity: daily walking 30 minutes  24-Hr Dietary Recall - not assessed this visit  Estimated Energy Needs Calories: 1800 (may need more than calculated for BMI due to level of activity with ADLs and desired weight  gain)   NUTRITION DIAGNOSIS  NB-1.1 Food and nutrition-related knowledge deficit As related to role of fiber in UC.  As evidenced by pt stated confusion by her search for information prior to visit.   NUTRITION INTERVENTION  Nutrition education (E-1) on the following topics:  Vitamin B12 vs B complex & labs Balanced eating and role of supplement Collagen  Handouts Provided Include  none  Learning Style & Readiness for Change Teaching method utilized: Visual & Auditory  Demonstrated degree of understanding via: Teach Back  Barriers to learning/adherence to lifestyle change: none  Goals Established by Pt Avoid skipping meals. Set an alarm if you are going to be involved in something that may make you forget to eat.  Try out the snacks that you brought today, they all look like handy ways to get snacks in between meals.  Continue having a balanced diet including whole grains, fruits & vegetables, healthy fats and lean protein.  Supplements could add value to your health goals. If your B12 levels are low supplementation is recommended. This would be good to check in with your doctor to see if a lab would be needed or whether or not a supplement is needed.  Calcium and magnesium are important and supplements could be beneficial. Look for an email in the next week or so from me about the different types.  MONITORING & EVALUATION Dietary intake, weekly physical activity, and weight prn

## 2021-03-08 DIAGNOSIS — D485 Neoplasm of uncertain behavior of skin: Secondary | ICD-10-CM | POA: Diagnosis not present

## 2021-03-08 DIAGNOSIS — L820 Inflamed seborrheic keratosis: Secondary | ICD-10-CM | POA: Diagnosis not present

## 2021-03-08 DIAGNOSIS — D1801 Hemangioma of skin and subcutaneous tissue: Secondary | ICD-10-CM | POA: Diagnosis not present

## 2021-03-08 DIAGNOSIS — L570 Actinic keratosis: Secondary | ICD-10-CM | POA: Diagnosis not present

## 2021-03-08 DIAGNOSIS — L821 Other seborrheic keratosis: Secondary | ICD-10-CM | POA: Diagnosis not present

## 2021-06-07 ENCOUNTER — Other Ambulatory Visit: Payer: Self-pay | Admitting: Obstetrics and Gynecology

## 2021-06-07 ENCOUNTER — Other Ambulatory Visit: Payer: Self-pay | Admitting: Nurse Practitioner

## 2021-06-07 DIAGNOSIS — Z1231 Encounter for screening mammogram for malignant neoplasm of breast: Secondary | ICD-10-CM

## 2021-07-12 DIAGNOSIS — H5203 Hypermetropia, bilateral: Secondary | ICD-10-CM | POA: Diagnosis not present

## 2021-07-12 DIAGNOSIS — H35373 Puckering of macula, bilateral: Secondary | ICD-10-CM | POA: Diagnosis not present

## 2021-07-12 DIAGNOSIS — H2513 Age-related nuclear cataract, bilateral: Secondary | ICD-10-CM | POA: Diagnosis not present

## 2021-07-16 ENCOUNTER — Ambulatory Visit: Payer: Medicare Other

## 2021-07-20 ENCOUNTER — Ambulatory Visit
Admission: RE | Admit: 2021-07-20 | Discharge: 2021-07-20 | Disposition: A | Discharge status: Home / Self Care | Payer: Medicare Other | Source: Ambulatory Visit | Attending: Nurse Practitioner | Admitting: Nurse Practitioner

## 2021-07-20 DIAGNOSIS — Z1231 Encounter for screening mammogram for malignant neoplasm of breast: Secondary | ICD-10-CM

## 2021-08-09 DIAGNOSIS — R42 Dizziness and giddiness: Secondary | ICD-10-CM | POA: Diagnosis not present

## 2021-08-09 DIAGNOSIS — S8992XA Unspecified injury of left lower leg, initial encounter: Secondary | ICD-10-CM | POA: Diagnosis not present

## 2021-08-09 DIAGNOSIS — E7800 Pure hypercholesterolemia, unspecified: Secondary | ICD-10-CM | POA: Diagnosis not present

## 2021-08-09 DIAGNOSIS — J45909 Unspecified asthma, uncomplicated: Secondary | ICD-10-CM | POA: Diagnosis not present

## 2021-08-09 DIAGNOSIS — K449 Diaphragmatic hernia without obstruction or gangrene: Secondary | ICD-10-CM | POA: Diagnosis not present

## 2021-08-09 DIAGNOSIS — R202 Paresthesia of skin: Secondary | ICD-10-CM | POA: Diagnosis not present

## 2021-08-13 ENCOUNTER — Other Ambulatory Visit (HOSPITAL_COMMUNITY)
Admission: RE | Admit: 2021-08-13 | Discharge: 2021-08-13 | Disposition: A | Discharge status: Home / Self Care | Payer: Medicare Other | Source: Ambulatory Visit | Attending: Nurse Practitioner | Admitting: Nurse Practitioner

## 2021-08-13 DIAGNOSIS — Z124 Encounter for screening for malignant neoplasm of cervix: Secondary | ICD-10-CM | POA: Insufficient documentation

## 2021-08-14 LAB — CYTOLOGY - PAP: Diagnosis: NEGATIVE

## 2021-08-16 DIAGNOSIS — M79605 Pain in left leg: Secondary | ICD-10-CM | POA: Diagnosis not present

## 2021-08-17 ENCOUNTER — Other Ambulatory Visit: Payer: Self-pay | Admitting: Nurse Practitioner

## 2021-08-17 DIAGNOSIS — M810 Age-related osteoporosis without current pathological fracture: Secondary | ICD-10-CM

## 2021-08-28 DIAGNOSIS — E7800 Pure hypercholesterolemia, unspecified: Secondary | ICD-10-CM | POA: Diagnosis not present

## 2021-08-28 DIAGNOSIS — K5120 Ulcerative (chronic) proctitis without complications: Secondary | ICD-10-CM | POA: Diagnosis not present

## 2021-08-28 DIAGNOSIS — E559 Vitamin D deficiency, unspecified: Secondary | ICD-10-CM | POA: Diagnosis not present

## 2021-08-31 DIAGNOSIS — J3089 Other allergic rhinitis: Secondary | ICD-10-CM | POA: Diagnosis not present

## 2021-08-31 DIAGNOSIS — K219 Gastro-esophageal reflux disease without esophagitis: Secondary | ICD-10-CM | POA: Diagnosis not present

## 2021-08-31 DIAGNOSIS — R052 Subacute cough: Secondary | ICD-10-CM | POA: Diagnosis not present

## 2021-08-31 DIAGNOSIS — J301 Allergic rhinitis due to pollen: Secondary | ICD-10-CM | POA: Diagnosis not present

## 2021-09-26 DIAGNOSIS — E7800 Pure hypercholesterolemia, unspecified: Secondary | ICD-10-CM | POA: Diagnosis not present

## 2021-09-26 DIAGNOSIS — E559 Vitamin D deficiency, unspecified: Secondary | ICD-10-CM | POA: Diagnosis not present

## 2021-10-19 DIAGNOSIS — Z885 Allergy status to narcotic agent status: Secondary | ICD-10-CM | POA: Diagnosis not present

## 2021-10-19 DIAGNOSIS — Z9481 Bone marrow transplant status: Secondary | ICD-10-CM | POA: Diagnosis not present

## 2021-10-19 DIAGNOSIS — Z886 Allergy status to analgesic agent status: Secondary | ICD-10-CM | POA: Diagnosis not present

## 2021-10-19 DIAGNOSIS — Z888 Allergy status to other drugs, medicaments and biological substances status: Secondary | ICD-10-CM | POA: Diagnosis not present

## 2021-10-19 DIAGNOSIS — Z08 Encounter for follow-up examination after completed treatment for malignant neoplasm: Secondary | ICD-10-CM | POA: Diagnosis not present

## 2021-10-19 DIAGNOSIS — Z856 Personal history of leukemia: Secondary | ICD-10-CM | POA: Diagnosis not present

## 2021-10-19 DIAGNOSIS — C9201 Acute myeloblastic leukemia, in remission: Secondary | ICD-10-CM | POA: Diagnosis not present

## 2021-10-19 DIAGNOSIS — Z880 Allergy status to penicillin: Secondary | ICD-10-CM | POA: Diagnosis not present

## 2021-10-24 DIAGNOSIS — E559 Vitamin D deficiency, unspecified: Secondary | ICD-10-CM | POA: Diagnosis not present

## 2021-10-24 DIAGNOSIS — Z713 Dietary counseling and surveillance: Secondary | ICD-10-CM | POA: Diagnosis not present

## 2021-10-24 DIAGNOSIS — E7800 Pure hypercholesterolemia, unspecified: Secondary | ICD-10-CM | POA: Diagnosis not present

## 2021-11-01 DIAGNOSIS — L920 Granuloma annulare: Secondary | ICD-10-CM | POA: Diagnosis not present

## 2021-11-01 DIAGNOSIS — L821 Other seborrheic keratosis: Secondary | ICD-10-CM | POA: Diagnosis not present

## 2021-11-22 DIAGNOSIS — E7800 Pure hypercholesterolemia, unspecified: Secondary | ICD-10-CM | POA: Diagnosis not present

## 2021-11-22 DIAGNOSIS — E559 Vitamin D deficiency, unspecified: Secondary | ICD-10-CM | POA: Diagnosis not present

## 2021-11-22 DIAGNOSIS — M810 Age-related osteoporosis without current pathological fracture: Secondary | ICD-10-CM | POA: Diagnosis not present

## 2021-12-16 ENCOUNTER — Emergency Department (HOSPITAL_COMMUNITY)
Admission: EM | Admit: 2021-12-16 | Discharge: 2021-12-16 | Disposition: A | Discharge status: Home / Self Care | Payer: Medicare Other | Attending: Emergency Medicine | Admitting: Emergency Medicine

## 2021-12-16 ENCOUNTER — Other Ambulatory Visit: Payer: Self-pay

## 2021-12-16 ENCOUNTER — Emergency Department (HOSPITAL_COMMUNITY): Payer: Medicare Other

## 2021-12-16 ENCOUNTER — Encounter (HOSPITAL_COMMUNITY): Payer: Self-pay

## 2021-12-16 DIAGNOSIS — Z743 Need for continuous supervision: Secondary | ICD-10-CM | POA: Diagnosis not present

## 2021-12-16 DIAGNOSIS — W19XXXA Unspecified fall, initial encounter: Secondary | ICD-10-CM | POA: Diagnosis not present

## 2021-12-16 DIAGNOSIS — Y92481 Parking lot as the place of occurrence of the external cause: Secondary | ICD-10-CM | POA: Insufficient documentation

## 2021-12-16 DIAGNOSIS — S8990XA Unspecified injury of unspecified lower leg, initial encounter: Secondary | ICD-10-CM | POA: Diagnosis not present

## 2021-12-16 DIAGNOSIS — W010XXA Fall on same level from slipping, tripping and stumbling without subsequent striking against object, initial encounter: Secondary | ICD-10-CM | POA: Diagnosis not present

## 2021-12-16 DIAGNOSIS — S82002A Unspecified fracture of left patella, initial encounter for closed fracture: Secondary | ICD-10-CM | POA: Diagnosis not present

## 2021-12-16 DIAGNOSIS — M25562 Pain in left knee: Secondary | ICD-10-CM | POA: Diagnosis not present

## 2021-12-16 DIAGNOSIS — S82042A Displaced comminuted fracture of left patella, initial encounter for closed fracture: Secondary | ICD-10-CM | POA: Diagnosis not present

## 2021-12-16 MED ORDER — ONDANSETRON 8 MG PO TBDP
8.0000 mg | ORAL_TABLET | Freq: Once | ORAL | Status: AC
  Administered 2021-12-16: 8 mg via ORAL
  Filled 2021-12-16: qty 1

## 2021-12-16 MED ORDER — MORPHINE SULFATE (PF) 4 MG/ML IV SOLN
4.0000 mg | Freq: Once | INTRAVENOUS | Status: AC
  Administered 2021-12-16: 4 mg via SUBCUTANEOUS
  Filled 2021-12-16: qty 1

## 2021-12-16 MED ORDER — ONDANSETRON 8 MG PO TBDP: 8.0000 mg | ORAL_TABLET | Freq: Three times a day (TID) | ORAL | 0 refills | Status: AC | PRN

## 2021-12-16 MED ORDER — HYDROCODONE-ACETAMINOPHEN 5-325 MG PO TABS: 2.0000 | ORAL_TABLET | ORAL | 0 refills | Status: DC | PRN

## 2021-12-16 NOTE — Progress Notes (Signed)
Orthopedic Tech Progress Note Patient Details:  Barbara Benitez [birth year] [id number]  Ortho Devices Type of Ortho Device: Knee Immobilizer, Crutches Ortho Device/Splint Location: LLE Ortho Device/Splint Interventions: Ordered, Application, Adjustment   Post Interventions Patient Tolerated: Well Instructions Provided: Adjustment of device, Care of device, Poper ambulation with device  [name] 12/16/2021, 3:18 PM

## 2021-12-16 NOTE — ED Notes (Signed)
Pt used bedpan successfully.

## 2021-12-16 NOTE — Discharge Instructions (Addendum)
Call Dr. Bettina Gavia office tomorrow morning to schedule an appointment to be seen tomorrow

## 2021-12-16 NOTE — ED Triage Notes (Signed)
Pt coming in via EMS with c/o of fall. Patient states she [place]. Obvious knee deformity noted to left knee. Hematoma to left forearm also noted. Patient does not c/o any other injuries. Patient denies hitting head, blood thinners, and LOC.

## 2021-12-16 NOTE — ED Provider Notes (Signed)
[facility] DEPT Provider Note   CSN: [id number] Arrival date & time: 12/16/21  1139     History  Chief Complaint  Patient presents with   Fall   Knee Injury    Barbara Benitez is a 66 y.o. female.  66 year old female presents after mechanical fall just prior to arrival.  Patient slipped and returns parking lot.  Did not strike her head.  Complains of severe left knee pain.  Denies any hip or pelvis discomfort.  Unable to ambulate.  EMS called and patient transported here       Home Medications Prior to Admission medications   Medication Sig Start Date End Date Taking? Authorizing Provider  albuterol (VENTOLIN HFA) 108 (90 Base) MCG/ACT inhaler Inhale 2 puffs into the lungs as needed. 12/29/17   [provider]  alendronate (FOSAMAX) 70 MG tablet Take 70 mg by mouth once a week.  09/24/17   [provider]  Biotin 10 MG TABS 1 tablet    [provider]  Biotin 1000 MCG tablet Take 1,000 mcg by mouth daily.    [provider]  Cyanocobalamin (B12 FAST DISSOLVE PO)     [provider]  cyanocobalamin 1000 MCG tablet Take 1,000 mcg by mouth daily.    [provider]  Fexofenadine HCl (ALLEGRA PO) Take 180 mg by mouth daily.    [provider]  fluticasone (FLONASE) 50 MCG/ACT nasal spray Place 1 spray into both nostrils daily as needed.    [provider]  loratadine (CLARITIN) 10 MG tablet Take 10 mg by mouth daily.    [provider]  Meclizine HCl 25 MG CHEW Chew 25 mg by mouth 3 (three) times daily as needed. Patient not taking: Reported on 03/09/2020    [provider]  vitamin k 100 MCG tablet Take 100 mcg by mouth daily.    [provider]  Zinc 50 MG TABS Take 50 mg by mouth daily.    [provider]      Allergies    Amoxicillin, Other, Codeine, Morphine, Percocet [oxycodone-acetaminophen], Vicodin [hydrocodone-acetaminophen], and  Meropenem    Review of Systems   Review of Systems  All other systems reviewed and are negative.   Physical Exam Updated Vital Signs SpO2 99%  Physical Exam Vitals and nursing note reviewed.  Constitutional:      General: She is not in acute distress.    Appearance: Normal appearance. She is well-developed. She is not toxic-appearing.  HENT:     Head: Normocephalic and atraumatic.  Eyes:     General: Lids are normal.     Conjunctiva/sclera: Conjunctivae normal.     Pupils: Pupils are equal, round, and reactive to light.  Neck:     Thyroid: No thyroid mass.     Trachea: No tracheal deviation.  Cardiovascular:     Rate and Rhythm: Normal rate and regular rhythm.     Heart sounds: Normal heart sounds. No murmur heard.    No gallop.  Pulmonary:     Effort: Pulmonary effort is normal. No respiratory distress.     Breath sounds: Normal breath sounds. No stridor. No decreased breath sounds, wheezing, rhonchi or rales.  Abdominal:     General: There is no distension.     Palpations: Abdomen is soft.     Tenderness: There is no abdominal tenderness. There is no rebound.  Musculoskeletal:        General: No tenderness.     Cervical  back: Normal range of motion and neck supple.     Left knee: Deformity and bony tenderness present. Decreased range of motion.     Comments: Neurovascular status intact at left foot  Skin:    General: Skin is warm and dry.     Findings: No abrasion or rash.  Neurological:     Mental Status: She is alert and oriented to person, place, and time. Mental status is at baseline.     GCS: GCS eye subscore is 4. GCS verbal subscore is 5. GCS motor subscore is 6.     Cranial Nerves: No cranial nerve deficit.     Sensory: No sensory deficit.     Motor: Motor function is intact.  Psychiatric:        Attention and Perception: Attention normal.        Speech: Speech normal.        Behavior: Behavior normal.     ED Results / Procedures / Treatments    Labs (all labs ordered are listed, but only abnormal results are displayed) Labs Reviewed - No data to display  EKG None  Radiology No results found.  Procedures Procedures    Medications Ordered in ED Medications - No data to display  ED Course/ Medical Decision Making/ A&P                           Medical Decision Making Amount and/or Complexity of Data Reviewed Radiology: ordered.   Patient offered pain medication she has deferred.  Patient is x-ray of her left knee shows a highly comminuted and distracted fracture of the distal pole of the left patella.  Obtain orthopedic consultation with Dr. Tamera Punt who reviewed the films and like patient to be seen in the office tomorrow.  Will arrange for discharge        Final Clinical Impression(s) / ED Diagnoses Final diagnoses:  None    Rx / DC Orders ED Discharge Orders     None         Lacretia Leigh, MD 12/16/21 1343

## 2021-12-17 ENCOUNTER — Other Ambulatory Visit: Payer: Self-pay | Admitting: Orthopedic Surgery

## 2021-12-17 DIAGNOSIS — M25562 Pain in left knee: Secondary | ICD-10-CM | POA: Diagnosis not present

## 2021-12-18 NOTE — Patient Instructions (Signed)
DUE TO COVID-19 ONLY TWO VISITORS  (aged 68 and older)  ARE ALLOWED TO COME WITH YOU AND STAY IN THE WAITING ROOM ONLY DURING PRE OP AND PROCEDURE.   **NO VISITORS ARE ALLOWED IN THE SHORT STAY AREA OR RECOVERY ROOM!!**  IF YOU WILL BE ADMITTED INTO THE HOSPITAL YOU ARE ALLOWED ONLY FOUR SUPPORT PEOPLE DURING VISITATION HOURS ONLY (7 AM -8PM)   The support person(s) must pass our screening, gel in and out, and wear a mask at all times, including in the patient's room. Patients must also wear a mask when staff or their support person are in the room. Visitors GUEST BADGE MUST BE WORN VISIBLY  One adult visitor may remain with you overnight and MUST be in the room by 8 P.M.     Your procedure is scheduled on: 12/20/21   Report to [facility] Main Entrance    Report to admitting at : 8:30 AM   Call this number if you have problems the morning of surgery [phone]   Do not eat food :After Midnight.   After Midnight you may have the following liquids until : 7:30 AM DAY OF SURGERY  Water Black Coffee (sugar ok, NO MILK/CREAM OR CREAMERS)  Tea (sugar ok, NO MILK/CREAM OR CREAMERS) regular and decaf                             Plain Jell-O (NO RED)                                           Fruit ices (not with fruit pulp, NO RED)                                     Popsicles (NO RED)                                                                  Juice: apple, WHITE grape, WHITE cranberry Sports drinks like Gatorade (NO RED)   The day of surgery:  Drink ONE (1) Pre-Surgery Clear Ensure or G2 at: 7:30 AM the morning of surgery. Drink in one sitting. Do not sip.  This drink was given to you during your hospital  pre-op appointment visit. Nothing else to drink after completing the  Pre-Surgery Clear Ensure or G2.          If you have questions, please contact your surgeon's office.  Oral Hygiene is also important to reduce your risk of infection.                                     Remember - BRUSH YOUR TEETH THE MORNING OF SURGERY WITH YOUR REGULAR TOOTHPASTE  DENTURES WILL BE REMOVED PRIOR TO SURGERY PLEASE DO NOT APPLY "Poly grip" OR ADHESIVES!!!   Do NOT smoke after Midnight   Take these medicines the morning of surgery with A SIP OF WATER: allegra,loratadine.Use inhalers and Flonase as usual.  You may not have any metal on your body including hair pins, jewelry, and body piercing             Do not wear make-up, lotions, powders, perfumes/cologne, or deodorant  Do not wear nail polish including gel and S&S, artificial/acrylic nails, or any other type of covering on natural nails including finger and toenails. If you have artificial nails, gel coating, etc. that needs to be removed by a nail salon please have this removed prior to surgery or surgery may need to be canceled/ delayed if the surgeon/ anesthesia feels like they are unable to be safely monitored.   Do not shave  48 hours prior to surgery.    Do not bring valuables to the hospital. [place].   Contacts, glasses, or bridgework may not be worn into surgery.   Bring small overnight bag day of surgery.   DO NOT [place]. PHARMACY WILL DISPENSE MEDICATIONS LISTED ON YOUR MEDICATION LIST TO YOU DURING YOUR ADMISSION [place]!    Patients discharged on the day of surgery will not be allowed to drive home.  Someone NEEDS to stay with you for the first 24 hours after anesthesia.   Special Instructions: Bring a copy of your healthcare power of attorney and living will documents         the day of surgery if you haven't scanned them before.              Please read over the following fact sheets you were given: IF YOU HAVE QUESTIONS ABOUT YOUR PRE-OP INSTRUCTIONS PLEASE CALL [phone]    [facility] - Preparing for Surgery Before surgery, you can play an important role.  Because skin is  not sterile, your skin needs to be as free of germs as possible.  You can reduce the number of germs on your skin by washing with CHG (chlorahexidine gluconate) soap before surgery.  CHG is an antiseptic cleaner which kills germs and bonds with the skin to continue killing germs even after washing. Please DO NOT use if you have an allergy to CHG or antibacterial soaps.  If your skin becomes reddened/irritated stop using the CHG and inform your nurse when you arrive at Short Stay. Do not shave (including legs and underarms) for at least 48 hours prior to the first CHG shower.  You may shave your face/neck. Please follow these instructions carefully:  1.  Shower with CHG Soap the night before surgery and the  morning of Surgery.  2.  If you choose to wash your hair, wash your hair first as usual with your  normal  shampoo.  3.  After you shampoo, rinse your hair and body thoroughly to remove the  shampoo.                           4.  Use CHG as you would any other liquid soap.  You can apply chg directly  to the skin and wash                       Gently with a scrungie or clean washcloth.  5.  Apply the CHG Soap to your body ONLY FROM THE NECK DOWN.   Do not use on face/ open  Wound or open sores. Avoid contact with eyes, ears mouth and genitals (private parts).                       Wash face,  Genitals (private parts) with your normal soap.             6.  Wash thoroughly, paying special attention to the area where your surgery  will be performed.  7.  Thoroughly rinse your body with warm water from the neck down.  8.  DO NOT shower/wash with your normal soap after using and rinsing off  the CHG Soap.                9.  Pat yourself dry with a clean towel.            10.  Wear clean pajamas.            11.  Place clean sheets on your bed the night of your first shower and do not  sleep with pets. Day of Surgery : Do not apply any lotions/deodorants the morning of surgery.   Please wear clean clothes to the hospital/surgery center.  FAILURE TO FOLLOW THESE INSTRUCTIONS MAY RESULT IN THE CANCELLATION OF YOUR SURGERY PATIENT SIGNATURE_________________________________  NURSE SIGNATURE__________________________________  ________________________________________________________________________  Adam Phenix  An incentive spirometer is a tool that can help keep your lungs clear and active. This tool measures how well you are filling your lungs with each breath. Taking long deep breaths may help reverse or decrease the chance of developing breathing (pulmonary) problems (especially infection) following: A long period of time when you are unable to move or be active. BEFORE THE PROCEDURE  If the spirometer includes an indicator to show your best effort, your nurse or respiratory therapist will set it to a desired goal. If possible, sit up straight or lean slightly forward. Try not to slouch. Hold the incentive spirometer in an upright position. INSTRUCTIONS FOR USE  Sit on the edge of your bed if possible, or sit up as far as you can in bed or on a chair. Hold the incentive spirometer in an upright position. Breathe out normally. Place the mouthpiece in your mouth and seal your lips tightly around it. Breathe in slowly and as deeply as possible, raising the piston or the ball toward the top of the column. Hold your breath for 3-5 seconds or for as long as possible. Allow the piston or ball to fall to the bottom of the column. Remove the mouthpiece from your mouth and breathe out normally. Rest for a few seconds and repeat Steps 1 through 7 at least 10 times every 1-2 hours when you are awake. Take your time and take a few normal breaths between deep breaths. The spirometer may include an indicator to show your best effort. Use the indicator as a goal to work toward during each repetition. After each set of 10 deep breaths, practice coughing to be sure your  lungs are clear. If you have an incision (the cut made at the time of surgery), support your incision when coughing by placing a pillow or rolled up towels firmly against it. Once you are able to get out of bed, walk around indoors and cough well. You may stop using the incentive spirometer when instructed by your caregiver.  RISKS AND COMPLICATIONS Take your time so you do not get dizzy or light-headed. If you are in pain, you may need to take or ask  for pain medication before doing incentive spirometry. It is harder to take a deep breath if you are having pain. AFTER USE Rest and breathe slowly and easily. It can be helpful to keep track of a log of your progress. Your caregiver can provide you with a simple table to help with this. If you are using the spirometer at home, follow these instructions: [place] IF:  You are having difficultly using the spirometer. You have trouble using the spirometer as often as instructed. Your pain medication is not giving enough relief while using the spirometer. You develop fever of 100.5 F (38.1 C) or higher. SEEK IMMEDIATE MEDICAL CARE IF:  You cough up bloody sputum that had not been present before. You develop fever of 102 F (38.9 C) or greater. You develop worsening pain at or near the incision site. MAKE SURE YOU:  Understand these instructions. Will watch your condition. Will get help right away if you are not doing well or get worse. Document Released: 05/13/2006 Document Revised: 03/25/2011 Document Reviewed: 07/14/2006 [facility] Patient Information 2014 [place], [place].   ________________________________________________________________________

## 2021-12-19 ENCOUNTER — Encounter (HOSPITAL_COMMUNITY)
Admission: RE | Admit: 2021-12-19 | Discharge: 2021-12-19 | Disposition: A | Discharge status: Home / Self Care | Payer: Medicare Other | Source: Ambulatory Visit | Attending: Orthopedic Surgery | Admitting: Orthopedic Surgery

## 2021-12-19 ENCOUNTER — Other Ambulatory Visit: Payer: Self-pay

## 2021-12-19 ENCOUNTER — Encounter (HOSPITAL_COMMUNITY): Payer: Self-pay

## 2021-12-19 VITALS — BP 142/72 | HR 77 | Temp 98.2°F | Ht 65.0 in | Wt 120.0 lb

## 2021-12-19 DIAGNOSIS — Z01812 Encounter for preprocedural laboratory examination: Secondary | ICD-10-CM | POA: Diagnosis not present

## 2021-12-19 DIAGNOSIS — Z01818 Encounter for other preprocedural examination: Secondary | ICD-10-CM

## 2021-12-19 HISTORY — DX: Other specified postprocedural states: R11.2

## 2021-12-19 HISTORY — DX: Other complications of anesthesia, initial encounter: T88.59XA

## 2021-12-19 HISTORY — DX: Leukemia, unspecified not having achieved remission: C95.90

## 2021-12-19 HISTORY — DX: Other specified postprocedural states: Z98.890

## 2021-12-19 LAB — CBC
HCT: 35.8 % — ABNORMAL LOW (ref 36.0–46.0)
Hemoglobin: 11.4 g/dL — ABNORMAL LOW (ref 12.0–15.0)
MCH: 30.6 pg (ref 26.0–34.0)
MCHC: 31.8 g/dL (ref 30.0–36.0)
MCV: 96 fL (ref 80.0–100.0)
Platelets: 234 10*3/uL (ref 150–400)
RBC: 3.73 MIL/uL — ABNORMAL LOW (ref 3.87–5.11)
RDW: 13.2 % (ref 11.5–15.5)
WBC: 8.5 10*3/uL (ref 4.0–10.5)
nRBC: 0 % (ref 0.0–0.2)

## 2021-12-19 NOTE — Progress Notes (Signed)
For Short Stay: [place] appointment date:  Bowel Prep reminder:   For Anesthesia: PCP [facility] Cardiologist - N/A  Chest x-ray -  EKG -  Stress Test -  ECHO -  Cardiac Cath -  Pacemaker/ICD device last checked: Pacemaker orders received: Device Rep notified:  Spinal Cord Stimulator:  Sleep Study -  CPAP -   Fasting Blood Sugar -  Checks Blood Sugar _____ times a day Date and result of last Hgb A1c-  Last dose of GLP1 agonist-  GLP1 instructions:   Last dose of SGLT-2 inhibitors-  SGLT-2 instructions:   Blood Thinner Instructions: Aspirin Instructions: Last Dose:  Activity level: Can go up a flight of stairs and activities of daily living without stopping and without chest pain and/or shortness of breath   Able to exercise without chest pain and/or shortness of breath   Unable to go up a flight of stairs without chest pain and/or shortness of breath     Anesthesia review:   Patient denies shortness of breath, fever, cough and chest pain at PAT appointment   Patient verbalized understanding of instructions that were given to them at the PAT appointment. Patient was also instructed that they will need to review over the PAT instructions again at home before surgery.

## 2021-12-19 NOTE — Anesthesia Preprocedure Evaluation (Signed)
Anesthesia Evaluation  Patient identified by MRN, date of birth, ID band Patient awake    Reviewed: Allergy & Precautions, NPO status , Patient's Chart, lab work & pertinent test results  History of Anesthesia Complications (+) PONV and history of anesthetic complications  Airway Mallampati: II  TM Distance: >3 FB Neck ROM: Full    Dental no notable dental hx. (+) Dental Advisory Given, Teeth Intact   Pulmonary asthma    Pulmonary exam normal breath sounds clear to auscultation       Cardiovascular negative cardio ROS Normal cardiovascular exam Rhythm:Regular Rate:Normal     Neuro/Psych negative neurological ROS     GI/Hepatic Neg liver ROS, PUD,,,  Endo/Other  negative endocrine ROS    Renal/GU negative Renal ROS     Musculoskeletal negative musculoskeletal ROS (+)    Abdominal   Peds  Hematology negative hematology ROS (+)   Anesthesia Other Findings   Reproductive/Obstetrics                             Anesthesia Physical Anesthesia Plan  ASA: 2  Anesthesia Plan: General   Post-op Pain Management: Regional block*, Tylenol PO (pre-op)* and Celebrex PO (pre-op)*   Induction: Intravenous  PONV Risk Score and Plan: 4 or greater and Ondansetron, Dexamethasone and Treatment may vary due to age or medical condition  Airway Management Planned: LMA  Additional Equipment:   Intra-op Plan:   Post-operative Plan: Extubation in OR  Informed Consent: I have reviewed the patients History and Physical, chart, labs and discussed the procedure including the risks, benefits and alternatives for the proposed anesthesia with the patient or authorized representative who has indicated his/her understanding and acceptance.     Dental advisory given  Plan Discussed with: CRNA  Anesthesia Plan Comments:         Anesthesia Quick Evaluation

## 2021-12-20 ENCOUNTER — Ambulatory Visit (HOSPITAL_COMMUNITY)
Admission: RE | Admit: 2021-12-20 | Discharge: 2021-12-20 | Disposition: A | Discharge status: Home / Self Care | Payer: Medicare Other | Attending: Orthopedic Surgery | Admitting: Orthopedic Surgery

## 2021-12-20 ENCOUNTER — Ambulatory Visit (HOSPITAL_COMMUNITY): Payer: Medicare Other | Admitting: Anesthesiology

## 2021-12-20 ENCOUNTER — Other Ambulatory Visit: Payer: Self-pay

## 2021-12-20 ENCOUNTER — Ambulatory Visit (HOSPITAL_BASED_OUTPATIENT_CLINIC_OR_DEPARTMENT_OTHER): Payer: Medicare Other | Admitting: Anesthesiology

## 2021-12-20 ENCOUNTER — Encounter (HOSPITAL_COMMUNITY)
Admission: RE | Disposition: A | Discharge status: Home / Self Care | Payer: Self-pay | Source: Home / Self Care | Attending: Orthopedic Surgery

## 2021-12-20 ENCOUNTER — Encounter (HOSPITAL_COMMUNITY): Payer: Self-pay | Admitting: Orthopedic Surgery

## 2021-12-20 DIAGNOSIS — W19XXXA Unspecified fall, initial encounter: Secondary | ICD-10-CM | POA: Diagnosis not present

## 2021-12-20 DIAGNOSIS — J45909 Unspecified asthma, uncomplicated: Secondary | ICD-10-CM | POA: Diagnosis not present

## 2021-12-20 DIAGNOSIS — S82002A Unspecified fracture of left patella, initial encounter for closed fracture: Secondary | ICD-10-CM

## 2021-12-20 DIAGNOSIS — S86812A Strain of other muscle(s) and tendon(s) at lower leg level, left leg, initial encounter: Secondary | ICD-10-CM | POA: Diagnosis not present

## 2021-12-20 HISTORY — PX: KNEE ARTHROSCOPY WITH PATELLAR TENDON REPAIR: SHX5656

## 2021-12-20 HISTORY — PX: PATELLAR TENDON REPAIR: SHX737

## 2021-12-20 SURGERY — KNEE ARTHROSCOPY WITH PATELLAR TENDON REPAIR
Anesthesia: General | Site: Knee | Laterality: Left

## 2021-12-20 MED ORDER — HYDROCODONE-ACETAMINOPHEN 5-325 MG PO TABS: 1.0000 | ORAL_TABLET | ORAL | 0 refills | Status: DC | PRN

## 2021-12-20 MED ORDER — DEXAMETHASONE SODIUM PHOSPHATE 10 MG/ML IJ SOLN
INTRAMUSCULAR | Status: AC
  Filled 2021-12-20: qty 1

## 2021-12-20 MED ORDER — HYDROCODONE-ACETAMINOPHEN 7.5-325 MG PO TABS: 1.0000 | ORAL_TABLET | ORAL | Status: DC | PRN

## 2021-12-20 MED ORDER — MIDAZOLAM HCL 2 MG/2ML IJ SOLN
INTRAMUSCULAR | Status: AC
  Filled 2021-12-20: qty 2

## 2021-12-20 MED ORDER — PROMETHAZINE HCL 25 MG/ML IJ SOLN: 6.2500 mg | INTRAMUSCULAR | Status: DC | PRN

## 2021-12-20 MED ORDER — DEXAMETHASONE SODIUM PHOSPHATE 10 MG/ML IJ SOLN
INTRAMUSCULAR | Status: DC | PRN
  Administered 2021-12-20: 10 mg via INTRAVENOUS

## 2021-12-20 MED ORDER — ONDANSETRON HCL 4 MG/2ML IJ SOLN
INTRAMUSCULAR | Status: DC | PRN
  Administered 2021-12-20: 4 mg via INTRAVENOUS

## 2021-12-20 MED ORDER — VANCOMYCIN HCL IN DEXTROSE 1-5 GM/200ML-% IV SOLN
1000.0000 mg | INTRAVENOUS | Status: DC
  Filled 2021-12-20: qty 200

## 2021-12-20 MED ORDER — FENTANYL CITRATE (PF) 100 MCG/2ML IJ SOLN
INTRAMUSCULAR | Status: AC
  Filled 2021-12-20: qty 2

## 2021-12-20 MED ORDER — ORAL CARE MOUTH RINSE: 15.0000 mL | Freq: Once | OROMUCOSAL | Status: AC

## 2021-12-20 MED ORDER — HYDROCODONE-ACETAMINOPHEN 5-325 MG PO TABS: 1.0000 | ORAL_TABLET | ORAL | Status: DC | PRN

## 2021-12-20 MED ORDER — PROPOFOL 500 MG/50ML IV EMUL
INTRAVENOUS | Status: DC | PRN
  Administered 2021-12-20: 120 mg via INTRAVENOUS
  Administered 2021-12-20: 60 mg via INTRAVENOUS
  Administered 2021-12-20: 20 mg via INTRAVENOUS

## 2021-12-20 MED ORDER — MEPERIDINE HCL 50 MG/ML IJ SOLN: 6.2500 mg | INTRAMUSCULAR | Status: DC | PRN

## 2021-12-20 MED ORDER — TRAMADOL HCL 50 MG PO TABS: 50.0000 mg | ORAL_TABLET | Freq: Four times a day (QID) | ORAL | 0 refills | Status: AC | PRN

## 2021-12-20 MED ORDER — CELECOXIB 200 MG PO CAPS
200.0000 mg | ORAL_CAPSULE | Freq: Once | ORAL | Status: AC
  Administered 2021-12-20: 200 mg via ORAL
  Filled 2021-12-20: qty 1

## 2021-12-20 MED ORDER — PROPOFOL 10 MG/ML IV BOLUS
INTRAVENOUS | Status: AC
  Filled 2021-12-20: qty 20

## 2021-12-20 MED ORDER — CHLORHEXIDINE GLUCONATE 0.12 % MT SOLN
15.0000 mL | Freq: Once | OROMUCOSAL | Status: AC
  Administered 2021-12-20: 15 mL via OROMUCOSAL

## 2021-12-20 MED ORDER — PROMETHAZINE HCL 12.5 MG PO TABS: 12.5000 mg | ORAL_TABLET | Freq: Four times a day (QID) | ORAL | 0 refills | Status: AC | PRN

## 2021-12-20 MED ORDER — TRAMADOL HCL 50 MG PO TABS: 50.0000 mg | ORAL_TABLET | Freq: Once | ORAL | Status: AC

## 2021-12-20 MED ORDER — TRAMADOL HCL 50 MG PO TABS
ORAL_TABLET | ORAL | Status: AC
  Administered 2021-12-20: 50 mg
  Filled 2021-12-20: qty 1

## 2021-12-20 MED ORDER — HYDROMORPHONE HCL 1 MG/ML IJ SOLN: 0.2500 mg | INTRAMUSCULAR | Status: DC | PRN

## 2021-12-20 MED ORDER — ACETAMINOPHEN 500 MG PO TABS
1000.0000 mg | ORAL_TABLET | Freq: Once | ORAL | Status: AC
  Administered 2021-12-20: 1000 mg via ORAL
  Filled 2021-12-20: qty 2

## 2021-12-20 MED ORDER — MIDAZOLAM HCL 2 MG/2ML IJ SOLN
INTRAMUSCULAR | Status: DC | PRN
  Administered 2021-12-20: 2 mg via INTRAVENOUS

## 2021-12-20 MED ORDER — LIDOCAINE 2% (20 MG/ML) 5 ML SYRINGE
INTRAMUSCULAR | Status: DC | PRN
  Administered 2021-12-20: 60 mg via INTRAVENOUS

## 2021-12-20 MED ORDER — LACTATED RINGERS IV SOLN: INTRAVENOUS | Status: DC

## 2021-12-20 MED ORDER — FENTANYL CITRATE (PF) 100 MCG/2ML IJ SOLN
INTRAMUSCULAR | Status: DC | PRN
  Administered 2021-12-20 (×2): 50 ug via INTRAVENOUS

## 2021-12-20 MED ORDER — CEFAZOLIN SODIUM-DEXTROSE 2-4 GM/100ML-% IV SOLN
2.0000 g | Freq: Once | INTRAVENOUS | Status: AC
  Administered 2021-12-20: 2 g via INTRAVENOUS

## 2021-12-20 MED ORDER — ONDANSETRON HCL 4 MG/2ML IJ SOLN
INTRAMUSCULAR | Status: AC
  Filled 2021-12-20: qty 2

## 2021-12-20 MED ORDER — CEFAZOLIN SODIUM-DEXTROSE 2-4 GM/100ML-% IV SOLN
INTRAVENOUS | Status: AC
  Filled 2021-12-20: qty 100

## 2021-12-20 MED ORDER — 0.9 % SODIUM CHLORIDE (POUR BTL) OPTIME
TOPICAL | Status: DC | PRN
  Administered 2021-12-20: 1000 mL

## 2021-12-20 MED ORDER — PHENYLEPHRINE 80 MCG/ML (10ML) SYRINGE FOR IV PUSH (FOR BLOOD PRESSURE SUPPORT)
PREFILLED_SYRINGE | INTRAVENOUS | Status: DC | PRN
  Administered 2021-12-20 (×2): 80 ug via INTRAVENOUS

## 2021-12-20 SURGICAL SUPPLY — 24 items
BAG COUNTER SPONGE SURGICOUNT (BAG) IMPLANT
BLADE SURG SZ11 CARB STEEL (BLADE) IMPLANT
BNDG ELASTIC 6X5.8 VLCR STR LF (GAUZE/BANDAGES/DRESSINGS) IMPLANT
COVER SURGICAL LIGHT HANDLE (MISCELLANEOUS) ×1 IMPLANT
DRSG AQUACEL AG ADV 3.5X 6 (GAUZE/BANDAGES/DRESSINGS) IMPLANT
GAUZE 4X4 16PLY ~~LOC~~+RFID DBL (SPONGE) ×1 IMPLANT
GAUZE PAD ABD 8X10 STRL (GAUZE/BANDAGES/DRESSINGS) IMPLANT
GLOVE BIO SURGEON STRL SZ7.5 (GLOVE) ×1 IMPLANT
GLOVE BIOGEL PI IND STRL 6.5 (GLOVE) ×1 IMPLANT
GLOVE BIOGEL PI IND STRL 8 (GLOVE) ×1 IMPLANT
GLOVE SURG POLYISO LF SZ6.5 (GLOVE) ×1 IMPLANT
GOWN STRL REUS W/ TWL XL LVL3 (GOWN DISPOSABLE) ×1 IMPLANT
GOWN STRL REUS W/TWL XL LVL3 (GOWN DISPOSABLE) ×1
IMMOBILIZER KNEE 20 (SOFTGOODS) ×1
IMMOBILIZER KNEE 20 THIGH 36 (SOFTGOODS) IMPLANT
KIT BASIN OR (CUSTOM PROCEDURE TRAY) IMPLANT
MANIFOLD NEPTUNE II (INSTRUMENTS) ×1 IMPLANT
PACK ARTHROSCOPY WL (CUSTOM PROCEDURE TRAY) ×1 IMPLANT
PENCIL SMOKE EVACUATOR (MISCELLANEOUS) IMPLANT
PROTECTOR NERVE ULNAR (MISCELLANEOUS) ×1 IMPLANT
RETRIEVER SUT HEWSON (MISCELLANEOUS) IMPLANT
SUT MNCRL AB 4-0 PS2 18 (SUTURE) IMPLANT
SUT VIC AB 1 CT1 36 (SUTURE) IMPLANT
TUBING ARTHROSCOPY IRRIG 16FT (MISCELLANEOUS) ×1 IMPLANT

## 2021-12-20 NOTE — Anesthesia Postprocedure Evaluation (Signed)
Anesthesia Post Note  Patient: Barbara Benitez  Procedure(s) Performed: LEFT KNEE EXCISION OF PATELLAR FRACTURE FRAGMENT AND KNEE  PATELLAR TENDON REPAIR (Left: Knee) PATELLAR TENDON REPAIR (Left: Knee)     Patient location during evaluation: PACU Anesthesia Type: General Level of consciousness: sedated and patient cooperative Pain management: pain level controlled Vital Signs Assessment: post-procedure vital signs reviewed and stable Respiratory status: spontaneous breathing Cardiovascular status: stable Anesthetic complications: no   No notable events documented.  Last Vitals:  Vitals:   12/20/21 0915 12/20/21 0930  BP: 118/71 126/71  Pulse: 67 71  Resp: (!) 7 16  Temp:  36.9 C  SpO2: 98% 98%    Last Pain:  Vitals:   12/20/21 1000  TempSrc:   PainSc: [place]

## 2021-12-20 NOTE — H&P (Signed)
Barbara Benitez is an 66 y.o. female.   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 She has never used smokeless tobacco. She reports that she does not drink alcohol and does not use drugs.  Allergies:  Allergies  Allergen Reactions   Amoxicillin Rash   Other Rash    (1) Transparent dressing - please use Mepilex   Codeine Nausea Only   Morphine Nausea And Vomiting   Percocet [Oxycodone-Acetaminophen] Nausea Only   Vicodin [Hydrocodone-Acetaminophen] Nausea Only   Latex Rash   Meropenem Rash    CAN NOT TOLERATE AT A FAST PACE CAUSED RASH    Medications Prior to Admission  Medication Sig Dispense Refill   acetaminophen (TYLENOL) 500 MG tablet Take 500 mg by mouth every 4 (four) hours as needed for moderate pain.     Biotin 1000 MCG tablet Take 1,000 mcg by mouth daily.     Fexofenadine HCl (ALLEGRA PO) Take 180 mg by mouth daily.     fluticasone (FLONASE) 50 MCG/ACT nasal spray Place 1 spray into both nostrils daily as needed for allergies.     HYDROcodone-acetaminophen (NORCO/VICODIN) 5-325 MG tablet Take 2 tablets by mouth every 4 (four) hours as needed. (Patient taking differently: Take 1 tablet by mouth every 4 (four) hours as needed for moderate pain.) 20 tablet 0   Omega 3 1200 MG CAPS Take 1,200 mg by mouth daily.      ondansetron (ZOFRAN-ODT) 8 MG disintegrating tablet Take 1 tablet (8 mg total) by mouth every 8 (eight) hours as needed for nausea or vomiting. 20 tablet 0   VITAMIN D PO Take 2,000 Units by mouth daily. Vitamin d 2000 unit drops     vitamin k 100 MCG tablet Take 100 mcg by mouth daily.     Zinc 50 MG TABS Take 50 mg by mouth daily.     albuterol (VENTOLIN HFA) 108 (90 Base) MCG/ACT inhaler Inhale 1-2 puffs into the lungs as needed for wheezing or shortness of breath.     mesalamine (CANASA) 1000 MG suppository Place 1,000 mg rectally as needed (for flare ups).      Results for orders placed or performed during the hospital encounter of 12/19/21 (from the past 48 hour(s))  CBC per protocol     Status: Abnormal   Collection Time: 12/19/21  9:44 AM  Result Value Ref Range   WBC 8.5 4.0 - 10.5 K/uL   RBC 3.73 (L) 3.87 - 5.11 MIL/uL   Hemoglobin 11.4 (L) 12.0 - 15.0 g/dL   HCT 35.8 (L) 36.0 - 46.0 %   MCV 96.0 80.0 - 100.0 fL   MCH 30.6 26.0 - 34.0 pg  MCHC 31.8 30.0 - 36.0 g/dL   RDW 13.2 11.5 - 15.5 %   Platelets 234 150 - 400 K/uL   nRBC 0.0 0.0 - 0.2 %    Comment: Performed at [facility], [place] [address], [place], [place] [address]   No results found.  Review of Systems  All other systems reviewed and are negative.   Blood pressure (!) 143/78, pulse 76, temperature 99.4 F (37.4 C), temperature source Oral, resp. rate 16, SpO2 98 %. Physical Exam HENT:     Head: Atraumatic.  Cardiovascular:     Pulses: Normal pulses.  Musculoskeletal:     Comments: L knee swollen TTP   Neurological:     Mental Status: She is alert.      Assessment/Plan s/p fall with displaced inferior pole patella fx Plan open repair Risks / benefits of surgery discussed Consent on chart  NPO for OR Preop antibiotics   Rhae Hammock, MD 12/20/2021, 7:36 AM

## 2021-12-20 NOTE — Discharge Instructions (Addendum)
Discharge Instructions after Patellar Tendon Repair  Pain medicine has been prescribed for you as well as anti-nausea medications. Use your medicine liberally over the first 48 hours, and then you can begin to taper your use. You may take Extra Strength Tylenol or Tylenol only in place of the pain pills. DO NOT take ANY nonsteroidal anti-inflammatory pain medications: Advil, Motrin, Ibuprofen, Aleve, Naproxen or Naprosyn.  The dressing is waterproof. You may shower the day after surgery.  Wear the knee immobilizer at all times except when bathing. Do not bend the knee at all.  You may bear weight on the leg as tolerated.  Take one aspirin, a day for 2 weeks after surgery, unless you have an aspirin sensitivity/ allergy or asthma.   Please call [phone] during normal business hours or [phone] after hours for any problems. Including the following:  - excessive redness of the incisions - drainage for more than 4 days - fever of more than 101.5 F  *Please note that pain medications will not be refilled after hours or on weekends.

## 2021-12-20 NOTE — Op Note (Signed)
Procedure(s):   Barbara Benitez female 66 y.o. 12/20/2021  Preoperative diagnosis: Left knee distal pole patella fracture  Postoperative diagnosis: Same  Procedure performed: Excision of distal patella fragment with patellar tendon repair  Surgeon(s) and Role:    Tania Ade, MD - Primary   Indications:  66 y.o. female s/p fall with left distal pole patella fracture with complete disruption of the extensor mechanism     Surgeon: Rhae Hammock   Assistants: [place] was present and scrubbed throughout the procedure and was essential in positioning, retraction, exposure, and closure)  Anesthesia: General endotracheal anesthesia with preoperative interscalene block given by the attending anesthesiologist    Procedure Detail  Findings: The distal pole fracture fragments were excised and the patellar tendon was repaired through 3 bone tunnels.  Estimated Blood Loss:  less than 50 mL         Drains: none  Blood Given: none         Specimens: none        Complications:  * No complications entered in OR log *         Disposition: PACU - hemodynamically stable.         Condition: stable    Procedure:  The patient was identified in the preoperative  holding area where I personally marked the operative site after  verifying site side and procedure with the patient. The patient was taken back  to the operating room where general anesthesia was induced without  Complication. The patient was placed in supine position. A non sterile tourniquet was applied to the thigh. The patient did receive IV antibiotics prior to the incision.   After the appropriate time-out, the limb was exsanguinated and the tourniquet was elevated to 300 mmHg.   A approximately 8 cm incision was made in the midline anteriorly over the knee.  Dissection was carried down through subcutaneous tissues to the fascia and the fracture was identified.  The joint was exposed and  cleaned of hematoma.  The distal fragment of bone was very small and was excised with a small rondure.  Proximally the fracture bed was prepared for repair.  At this point 2 suture tapes were used in a Krakw locking suture configuration to prepare the distal patellar tendon.  3 drill holes were sequentially drilled longitudinally through the patella with a 2 mm drill and the sutures were passed and tied over a bone bridge superiorly securing the patellar tendon nicely.  At this point the repair was oversewn with 0 Vicryl suture and the retinaculum was repaired as well.  At this point irrigation was used and the skin was closed in layers with 2-0 Vicryl and 4-0 Monocryl.  Steri-Strips were applied.  An Aquacel dressing was applied.  The patient was then placed in a knee immobilizer with the knee fully extended allowed awaken from anesthesia transferred to the stretcher and taken to the recovery room in stable condition.  Postoperative plan: She will be discharged home today and will be partial weightbearing on the fully extended knee in the knee immobilizer.  Follow-up again with me in about 2 weeks for wound check.  She will be transition to a hinged brace at that time.

## 2021-12-20 NOTE — Anesthesia Procedure Notes (Signed)
Procedure Name: LMA Insertion Date/Time: 12/20/2021 7:50 AM  Performed by: Gerald Leitz, CRNAPre-anesthesia Checklist: Patient identified, Patient being monitored, Timeout performed, Emergency Drugs available and Suction available Patient Re-evaluated:Patient Re-evaluated prior to induction Oxygen Delivery Method: Circle system utilized Preoxygenation: Pre-oxygenation with 100% oxygen Induction Type: IV induction Ventilation: Mask ventilation without difficulty LMA: LMA inserted LMA Size: 4.0 Tube type: Oral Number of attempts: 1 Placement Confirmation: positive ETCO2 and breath sounds checked- equal and bilateral Tube secured with: Tape Dental Injury: Teeth and Oropharynx as per pre-operative assessment

## 2021-12-20 NOTE — Transfer of Care (Signed)
Immediate Anesthesia Transfer of Care Note  Patient: Barbara Benitez  Procedure(s) Performed: Procedure(s): LEFT KNEE EXCISION OF PATELLAR FRACTURE FRAGMENT AND KNEE  PATELLAR TENDON REPAIR (Left) PATELLAR TENDON REPAIR (Left)  Patient Location: PACU  Anesthesia Type:General  Level of Consciousness: Alert, Awake, Oriented  Airway & Oxygen Therapy: Patient Spontanous Breathing  Post-op Assessment: Report given to RN  Post vital signs: Reviewed and stable  Last Vitals:  Vitals:   12/20/21 0619  BP: (!) 143/78  Pulse: 76  Resp: 16  Temp: 37.4 C  SpO2: 94%    Complications: No apparent anesthesia complications

## 2021-12-28 ENCOUNTER — Encounter (HOSPITAL_COMMUNITY): Payer: Self-pay | Admitting: Orthopedic Surgery

## 2022-01-02 DIAGNOSIS — S82002A Unspecified fracture of left patella, initial encounter for closed fracture: Secondary | ICD-10-CM | POA: Diagnosis not present

## 2022-01-23 DIAGNOSIS — Z856 Personal history of leukemia: Secondary | ICD-10-CM | POA: Diagnosis not present

## 2022-01-23 DIAGNOSIS — Z0000 Encounter for general adult medical examination without abnormal findings: Secondary | ICD-10-CM | POA: Diagnosis not present

## 2022-01-23 DIAGNOSIS — J45909 Unspecified asthma, uncomplicated: Secondary | ICD-10-CM | POA: Diagnosis not present

## 2022-01-23 DIAGNOSIS — Z79899 Other long term (current) drug therapy: Secondary | ICD-10-CM | POA: Diagnosis not present

## 2022-01-23 DIAGNOSIS — E7800 Pure hypercholesterolemia, unspecified: Secondary | ICD-10-CM | POA: Diagnosis not present

## 2022-01-23 DIAGNOSIS — M810 Age-related osteoporosis without current pathological fracture: Secondary | ICD-10-CM | POA: Diagnosis not present

## 2022-01-23 DIAGNOSIS — K5120 Ulcerative (chronic) proctitis without complications: Secondary | ICD-10-CM | POA: Diagnosis not present

## 2022-01-23 DIAGNOSIS — E162 Hypoglycemia, unspecified: Secondary | ICD-10-CM | POA: Diagnosis not present

## 2022-01-23 DIAGNOSIS — E559 Vitamin D deficiency, unspecified: Secondary | ICD-10-CM | POA: Diagnosis not present

## 2022-01-23 DIAGNOSIS — D649 Anemia, unspecified: Secondary | ICD-10-CM | POA: Diagnosis not present

## 2022-01-30 DIAGNOSIS — R531 Weakness: Secondary | ICD-10-CM | POA: Diagnosis not present

## 2022-01-30 DIAGNOSIS — M25662 Stiffness of left knee, not elsewhere classified: Secondary | ICD-10-CM | POA: Diagnosis not present

## 2022-01-30 DIAGNOSIS — R262 Difficulty in walking, not elsewhere classified: Secondary | ICD-10-CM | POA: Diagnosis not present

## 2022-01-31 DIAGNOSIS — H6123 Impacted cerumen, bilateral: Secondary | ICD-10-CM | POA: Diagnosis not present

## 2022-02-01 ENCOUNTER — Other Ambulatory Visit: Payer: Medicare Other

## 2022-02-01 DIAGNOSIS — R531 Weakness: Secondary | ICD-10-CM | POA: Diagnosis not present

## 2022-02-01 DIAGNOSIS — M25662 Stiffness of left knee, not elsewhere classified: Secondary | ICD-10-CM | POA: Diagnosis not present

## 2022-02-01 DIAGNOSIS — R262 Difficulty in walking, not elsewhere classified: Secondary | ICD-10-CM | POA: Diagnosis not present

## 2022-02-05 ENCOUNTER — Other Ambulatory Visit: Payer: Medicare Other

## 2022-02-06 DIAGNOSIS — M25662 Stiffness of left knee, not elsewhere classified: Secondary | ICD-10-CM | POA: Diagnosis not present

## 2022-02-06 DIAGNOSIS — R531 Weakness: Secondary | ICD-10-CM | POA: Diagnosis not present

## 2022-02-06 DIAGNOSIS — R262 Difficulty in walking, not elsewhere classified: Secondary | ICD-10-CM | POA: Diagnosis not present

## 2022-02-11 DIAGNOSIS — R531 Weakness: Secondary | ICD-10-CM | POA: Diagnosis not present

## 2022-02-11 DIAGNOSIS — M25662 Stiffness of left knee, not elsewhere classified: Secondary | ICD-10-CM | POA: Diagnosis not present

## 2022-02-11 DIAGNOSIS — R262 Difficulty in walking, not elsewhere classified: Secondary | ICD-10-CM | POA: Diagnosis not present

## 2022-02-14 DIAGNOSIS — R531 Weakness: Secondary | ICD-10-CM | POA: Diagnosis not present

## 2022-02-14 DIAGNOSIS — M25662 Stiffness of left knee, not elsewhere classified: Secondary | ICD-10-CM | POA: Diagnosis not present

## 2022-02-14 DIAGNOSIS — R262 Difficulty in walking, not elsewhere classified: Secondary | ICD-10-CM | POA: Diagnosis not present

## 2022-02-25 DIAGNOSIS — R262 Difficulty in walking, not elsewhere classified: Secondary | ICD-10-CM | POA: Diagnosis not present

## 2022-02-25 DIAGNOSIS — M25662 Stiffness of left knee, not elsewhere classified: Secondary | ICD-10-CM | POA: Diagnosis not present

## 2022-02-25 DIAGNOSIS — R531 Weakness: Secondary | ICD-10-CM | POA: Diagnosis not present

## 2022-02-27 DIAGNOSIS — M25662 Stiffness of left knee, not elsewhere classified: Secondary | ICD-10-CM | POA: Diagnosis not present

## 2022-02-27 DIAGNOSIS — R531 Weakness: Secondary | ICD-10-CM | POA: Diagnosis not present

## 2022-02-27 DIAGNOSIS — R262 Difficulty in walking, not elsewhere classified: Secondary | ICD-10-CM | POA: Diagnosis not present

## 2022-03-04 DIAGNOSIS — R262 Difficulty in walking, not elsewhere classified: Secondary | ICD-10-CM | POA: Diagnosis not present

## 2022-03-04 DIAGNOSIS — R531 Weakness: Secondary | ICD-10-CM | POA: Diagnosis not present

## 2022-03-04 DIAGNOSIS — M25662 Stiffness of left knee, not elsewhere classified: Secondary | ICD-10-CM | POA: Diagnosis not present

## 2022-03-06 DIAGNOSIS — R262 Difficulty in walking, not elsewhere classified: Secondary | ICD-10-CM | POA: Diagnosis not present

## 2022-03-06 DIAGNOSIS — M25662 Stiffness of left knee, not elsewhere classified: Secondary | ICD-10-CM | POA: Diagnosis not present

## 2022-03-06 DIAGNOSIS — R531 Weakness: Secondary | ICD-10-CM | POA: Diagnosis not present

## 2022-03-11 DIAGNOSIS — R531 Weakness: Secondary | ICD-10-CM | POA: Diagnosis not present

## 2022-03-11 DIAGNOSIS — M25662 Stiffness of left knee, not elsewhere classified: Secondary | ICD-10-CM | POA: Diagnosis not present

## 2022-03-11 DIAGNOSIS — R262 Difficulty in walking, not elsewhere classified: Secondary | ICD-10-CM | POA: Diagnosis not present

## 2022-03-13 DIAGNOSIS — R262 Difficulty in walking, not elsewhere classified: Secondary | ICD-10-CM | POA: Diagnosis not present

## 2022-03-13 DIAGNOSIS — M25662 Stiffness of left knee, not elsewhere classified: Secondary | ICD-10-CM | POA: Diagnosis not present

## 2022-03-13 DIAGNOSIS — R531 Weakness: Secondary | ICD-10-CM | POA: Diagnosis not present

## 2022-03-18 DIAGNOSIS — R531 Weakness: Secondary | ICD-10-CM | POA: Diagnosis not present

## 2022-03-18 DIAGNOSIS — M25662 Stiffness of left knee, not elsewhere classified: Secondary | ICD-10-CM | POA: Diagnosis not present

## 2022-03-18 DIAGNOSIS — R262 Difficulty in walking, not elsewhere classified: Secondary | ICD-10-CM | POA: Diagnosis not present

## 2022-03-25 DIAGNOSIS — M25662 Stiffness of left knee, not elsewhere classified: Secondary | ICD-10-CM | POA: Diagnosis not present

## 2022-03-27 DIAGNOSIS — M25662 Stiffness of left knee, not elsewhere classified: Secondary | ICD-10-CM | POA: Diagnosis not present

## 2022-03-27 DIAGNOSIS — R262 Difficulty in walking, not elsewhere classified: Secondary | ICD-10-CM | POA: Diagnosis not present

## 2022-03-27 DIAGNOSIS — R531 Weakness: Secondary | ICD-10-CM | POA: Diagnosis not present

## 2022-03-29 DIAGNOSIS — M25662 Stiffness of left knee, not elsewhere classified: Secondary | ICD-10-CM | POA: Diagnosis not present

## 2022-03-29 DIAGNOSIS — R262 Difficulty in walking, not elsewhere classified: Secondary | ICD-10-CM | POA: Diagnosis not present

## 2022-03-29 DIAGNOSIS — R531 Weakness: Secondary | ICD-10-CM | POA: Diagnosis not present

## 2022-04-01 DIAGNOSIS — R262 Difficulty in walking, not elsewhere classified: Secondary | ICD-10-CM | POA: Diagnosis not present

## 2022-04-01 DIAGNOSIS — R531 Weakness: Secondary | ICD-10-CM | POA: Diagnosis not present

## 2022-04-01 DIAGNOSIS — M25662 Stiffness of left knee, not elsewhere classified: Secondary | ICD-10-CM | POA: Diagnosis not present

## 2022-04-03 DIAGNOSIS — M25662 Stiffness of left knee, not elsewhere classified: Secondary | ICD-10-CM | POA: Diagnosis not present

## 2022-04-03 DIAGNOSIS — R531 Weakness: Secondary | ICD-10-CM | POA: Diagnosis not present

## 2022-04-03 DIAGNOSIS — R262 Difficulty in walking, not elsewhere classified: Secondary | ICD-10-CM | POA: Diagnosis not present

## 2022-04-09 DIAGNOSIS — R262 Difficulty in walking, not elsewhere classified: Secondary | ICD-10-CM | POA: Diagnosis not present

## 2022-04-09 DIAGNOSIS — R531 Weakness: Secondary | ICD-10-CM | POA: Diagnosis not present

## 2022-04-09 DIAGNOSIS — M25662 Stiffness of left knee, not elsewhere classified: Secondary | ICD-10-CM | POA: Diagnosis not present

## 2022-04-11 DIAGNOSIS — R262 Difficulty in walking, not elsewhere classified: Secondary | ICD-10-CM | POA: Diagnosis not present

## 2022-04-11 DIAGNOSIS — R531 Weakness: Secondary | ICD-10-CM | POA: Diagnosis not present

## 2022-04-11 DIAGNOSIS — M25662 Stiffness of left knee, not elsewhere classified: Secondary | ICD-10-CM | POA: Diagnosis not present

## 2022-04-15 DIAGNOSIS — R531 Weakness: Secondary | ICD-10-CM | POA: Diagnosis not present

## 2022-04-15 DIAGNOSIS — R262 Difficulty in walking, not elsewhere classified: Secondary | ICD-10-CM | POA: Diagnosis not present

## 2022-04-15 DIAGNOSIS — M25662 Stiffness of left knee, not elsewhere classified: Secondary | ICD-10-CM | POA: Diagnosis not present

## 2022-04-17 DIAGNOSIS — R531 Weakness: Secondary | ICD-10-CM | POA: Diagnosis not present

## 2022-04-17 DIAGNOSIS — M25662 Stiffness of left knee, not elsewhere classified: Secondary | ICD-10-CM | POA: Diagnosis not present

## 2022-04-17 DIAGNOSIS — R262 Difficulty in walking, not elsewhere classified: Secondary | ICD-10-CM | POA: Diagnosis not present

## 2022-04-18 DIAGNOSIS — L821 Other seborrheic keratosis: Secondary | ICD-10-CM | POA: Diagnosis not present

## 2022-04-18 DIAGNOSIS — L814 Other melanin hyperpigmentation: Secondary | ICD-10-CM | POA: Diagnosis not present

## 2022-04-18 DIAGNOSIS — D225 Melanocytic nevi of trunk: Secondary | ICD-10-CM | POA: Diagnosis not present

## 2022-04-18 DIAGNOSIS — D1801 Hemangioma of skin and subcutaneous tissue: Secondary | ICD-10-CM | POA: Diagnosis not present

## 2022-04-18 DIAGNOSIS — L738 Other specified follicular disorders: Secondary | ICD-10-CM | POA: Diagnosis not present

## 2022-04-18 DIAGNOSIS — Q825 Congenital non-neoplastic nevus: Secondary | ICD-10-CM | POA: Diagnosis not present

## 2022-04-18 DIAGNOSIS — L570 Actinic keratosis: Secondary | ICD-10-CM | POA: Diagnosis not present

## 2022-04-18 DIAGNOSIS — D2271 Melanocytic nevi of right lower limb, including hip: Secondary | ICD-10-CM | POA: Diagnosis not present

## 2022-04-18 DIAGNOSIS — D2222 Melanocytic nevi of left ear and external auricular canal: Secondary | ICD-10-CM | POA: Diagnosis not present

## 2022-04-22 DIAGNOSIS — M25662 Stiffness of left knee, not elsewhere classified: Secondary | ICD-10-CM | POA: Diagnosis not present

## 2022-04-22 DIAGNOSIS — R531 Weakness: Secondary | ICD-10-CM | POA: Diagnosis not present

## 2022-04-22 DIAGNOSIS — R262 Difficulty in walking, not elsewhere classified: Secondary | ICD-10-CM | POA: Diagnosis not present

## 2022-04-24 DIAGNOSIS — M25662 Stiffness of left knee, not elsewhere classified: Secondary | ICD-10-CM | POA: Diagnosis not present

## 2022-04-24 DIAGNOSIS — R262 Difficulty in walking, not elsewhere classified: Secondary | ICD-10-CM | POA: Diagnosis not present

## 2022-04-24 DIAGNOSIS — R531 Weakness: Secondary | ICD-10-CM | POA: Diagnosis not present

## 2022-04-29 DIAGNOSIS — M25662 Stiffness of left knee, not elsewhere classified: Secondary | ICD-10-CM | POA: Diagnosis not present

## 2022-04-29 DIAGNOSIS — R531 Weakness: Secondary | ICD-10-CM | POA: Diagnosis not present

## 2022-04-29 DIAGNOSIS — R262 Difficulty in walking, not elsewhere classified: Secondary | ICD-10-CM | POA: Diagnosis not present

## 2022-05-01 DIAGNOSIS — M25662 Stiffness of left knee, not elsewhere classified: Secondary | ICD-10-CM | POA: Diagnosis not present

## 2022-05-01 DIAGNOSIS — R262 Difficulty in walking, not elsewhere classified: Secondary | ICD-10-CM | POA: Diagnosis not present

## 2022-05-01 DIAGNOSIS — R531 Weakness: Secondary | ICD-10-CM | POA: Diagnosis not present

## 2022-05-06 DIAGNOSIS — M25562 Pain in left knee: Secondary | ICD-10-CM | POA: Diagnosis not present

## 2022-05-08 DIAGNOSIS — R262 Difficulty in walking, not elsewhere classified: Secondary | ICD-10-CM | POA: Diagnosis not present

## 2022-05-08 DIAGNOSIS — M25662 Stiffness of left knee, not elsewhere classified: Secondary | ICD-10-CM | POA: Diagnosis not present

## 2022-05-08 DIAGNOSIS — R531 Weakness: Secondary | ICD-10-CM | POA: Diagnosis not present

## 2022-05-15 DIAGNOSIS — R531 Weakness: Secondary | ICD-10-CM | POA: Diagnosis not present

## 2022-05-15 DIAGNOSIS — M25662 Stiffness of left knee, not elsewhere classified: Secondary | ICD-10-CM | POA: Diagnosis not present

## 2022-05-15 DIAGNOSIS — R262 Difficulty in walking, not elsewhere classified: Secondary | ICD-10-CM | POA: Diagnosis not present

## 2022-05-22 DIAGNOSIS — R262 Difficulty in walking, not elsewhere classified: Secondary | ICD-10-CM | POA: Diagnosis not present

## 2022-05-22 DIAGNOSIS — M25662 Stiffness of left knee, not elsewhere classified: Secondary | ICD-10-CM | POA: Diagnosis not present

## 2022-05-22 DIAGNOSIS — R531 Weakness: Secondary | ICD-10-CM | POA: Diagnosis not present

## 2022-05-29 DIAGNOSIS — R262 Difficulty in walking, not elsewhere classified: Secondary | ICD-10-CM | POA: Diagnosis not present

## 2022-05-29 DIAGNOSIS — M25662 Stiffness of left knee, not elsewhere classified: Secondary | ICD-10-CM | POA: Diagnosis not present

## 2022-05-29 DIAGNOSIS — R531 Weakness: Secondary | ICD-10-CM | POA: Diagnosis not present

## 2022-05-29 DIAGNOSIS — M25672 Stiffness of left ankle, not elsewhere classified: Secondary | ICD-10-CM | POA: Diagnosis not present

## 2022-06-05 DIAGNOSIS — R262 Difficulty in walking, not elsewhere classified: Secondary | ICD-10-CM | POA: Diagnosis not present

## 2022-06-05 DIAGNOSIS — M25662 Stiffness of left knee, not elsewhere classified: Secondary | ICD-10-CM | POA: Diagnosis not present

## 2022-06-05 DIAGNOSIS — R531 Weakness: Secondary | ICD-10-CM | POA: Diagnosis not present

## 2022-06-05 DIAGNOSIS — M25672 Stiffness of left ankle, not elsewhere classified: Secondary | ICD-10-CM | POA: Diagnosis not present

## 2022-06-11 ENCOUNTER — Other Ambulatory Visit: Payer: Self-pay | Admitting: Nurse Practitioner

## 2022-06-11 DIAGNOSIS — Z0000 Encounter for general adult medical examination without abnormal findings: Secondary | ICD-10-CM

## 2022-06-12 DIAGNOSIS — R531 Weakness: Secondary | ICD-10-CM | POA: Diagnosis not present

## 2022-06-12 DIAGNOSIS — R262 Difficulty in walking, not elsewhere classified: Secondary | ICD-10-CM | POA: Diagnosis not present

## 2022-06-12 DIAGNOSIS — M25662 Stiffness of left knee, not elsewhere classified: Secondary | ICD-10-CM | POA: Diagnosis not present

## 2022-06-12 DIAGNOSIS — M25672 Stiffness of left ankle, not elsewhere classified: Secondary | ICD-10-CM | POA: Diagnosis not present

## 2022-06-19 DIAGNOSIS — R531 Weakness: Secondary | ICD-10-CM | POA: Diagnosis not present

## 2022-06-19 DIAGNOSIS — R262 Difficulty in walking, not elsewhere classified: Secondary | ICD-10-CM | POA: Diagnosis not present

## 2022-06-19 DIAGNOSIS — M25672 Stiffness of left ankle, not elsewhere classified: Secondary | ICD-10-CM | POA: Diagnosis not present

## 2022-06-19 DIAGNOSIS — M25662 Stiffness of left knee, not elsewhere classified: Secondary | ICD-10-CM | POA: Diagnosis not present

## 2022-06-26 DIAGNOSIS — R531 Weakness: Secondary | ICD-10-CM | POA: Diagnosis not present

## 2022-06-26 DIAGNOSIS — R262 Difficulty in walking, not elsewhere classified: Secondary | ICD-10-CM | POA: Diagnosis not present

## 2022-06-26 DIAGNOSIS — M25672 Stiffness of left ankle, not elsewhere classified: Secondary | ICD-10-CM | POA: Diagnosis not present

## 2022-06-26 DIAGNOSIS — M25662 Stiffness of left knee, not elsewhere classified: Secondary | ICD-10-CM | POA: Diagnosis not present

## 2022-07-08 ENCOUNTER — Ambulatory Visit
Admission: RE | Admit: 2022-07-08 | Discharge: 2022-07-08 | Disposition: A | Discharge status: Home / Self Care | Payer: Medicare Other | Source: Ambulatory Visit | Attending: Nurse Practitioner | Admitting: Nurse Practitioner

## 2022-07-08 DIAGNOSIS — M810 Age-related osteoporosis without current pathological fracture: Secondary | ICD-10-CM | POA: Diagnosis not present

## 2022-07-08 DIAGNOSIS — E349 Endocrine disorder, unspecified: Secondary | ICD-10-CM | POA: Diagnosis not present

## 2022-07-15 DIAGNOSIS — R1032 Left lower quadrant pain: Secondary | ICD-10-CM | POA: Diagnosis not present

## 2022-07-15 DIAGNOSIS — M542 Cervicalgia: Secondary | ICD-10-CM | POA: Diagnosis not present

## 2022-07-16 ENCOUNTER — Other Ambulatory Visit: Payer: Self-pay | Admitting: Internal Medicine

## 2022-07-16 ENCOUNTER — Ambulatory Visit
Admission: RE | Admit: 2022-07-16 | Discharge: 2022-07-16 | Disposition: A | Discharge status: Home / Self Care | Payer: Medicare Other | Source: Ambulatory Visit | Attending: Internal Medicine | Admitting: Internal Medicine

## 2022-07-16 DIAGNOSIS — M542 Cervicalgia: Secondary | ICD-10-CM

## 2022-07-16 DIAGNOSIS — M5030 Other cervical disc degeneration, unspecified cervical region: Secondary | ICD-10-CM | POA: Diagnosis not present

## 2022-07-22 ENCOUNTER — Ambulatory Visit
Admission: RE | Admit: 2022-07-22 | Discharge: 2022-07-22 | Disposition: A | Discharge status: Home / Self Care | Payer: Medicare Other | Source: Ambulatory Visit | Attending: Nurse Practitioner | Admitting: Nurse Practitioner

## 2022-07-22 DIAGNOSIS — Z1231 Encounter for screening mammogram for malignant neoplasm of breast: Secondary | ICD-10-CM | POA: Diagnosis not present

## 2022-07-22 DIAGNOSIS — Z0000 Encounter for general adult medical examination without abnormal findings: Secondary | ICD-10-CM

## 2022-07-24 DIAGNOSIS — H2513 Age-related nuclear cataract, bilateral: Secondary | ICD-10-CM | POA: Diagnosis not present

## 2022-07-24 DIAGNOSIS — H35373 Puckering of macula, bilateral: Secondary | ICD-10-CM | POA: Diagnosis not present

## 2022-07-24 DIAGNOSIS — H524 Presbyopia: Secondary | ICD-10-CM | POA: Diagnosis not present

## 2022-07-25 ENCOUNTER — Other Ambulatory Visit: Payer: Self-pay | Admitting: Internal Medicine

## 2022-07-25 DIAGNOSIS — M542 Cervicalgia: Secondary | ICD-10-CM | POA: Diagnosis not present

## 2022-07-25 DIAGNOSIS — R1030 Lower abdominal pain, unspecified: Secondary | ICD-10-CM

## 2022-07-29 DIAGNOSIS — M542 Cervicalgia: Secondary | ICD-10-CM | POA: Diagnosis not present

## 2022-08-01 DIAGNOSIS — M542 Cervicalgia: Secondary | ICD-10-CM | POA: Diagnosis not present

## 2022-08-05 ENCOUNTER — Ambulatory Visit
Admission: RE | Admit: 2022-08-05 | Discharge: 2022-08-05 | Disposition: A | Discharge status: Home / Self Care | Payer: Medicare Other | Source: Ambulatory Visit | Attending: Internal Medicine | Admitting: Internal Medicine

## 2022-08-05 DIAGNOSIS — K7689 Other specified diseases of liver: Secondary | ICD-10-CM | POA: Diagnosis not present

## 2022-08-05 DIAGNOSIS — R1030 Lower abdominal pain, unspecified: Secondary | ICD-10-CM

## 2022-08-05 DIAGNOSIS — M542 Cervicalgia: Secondary | ICD-10-CM | POA: Diagnosis not present

## 2022-08-05 MED ORDER — IOPAMIDOL (ISOVUE-300) INJECTION 61%
100.0000 mL | Freq: Once | INTRAVENOUS | Status: AC | PRN
  Administered 2022-08-05: 100 mL via INTRAVENOUS

## 2022-08-08 DIAGNOSIS — M542 Cervicalgia: Secondary | ICD-10-CM | POA: Diagnosis not present

## 2022-08-12 DIAGNOSIS — M542 Cervicalgia: Secondary | ICD-10-CM | POA: Diagnosis not present

## 2022-08-15 DIAGNOSIS — M542 Cervicalgia: Secondary | ICD-10-CM | POA: Diagnosis not present

## 2022-08-20 DIAGNOSIS — M542 Cervicalgia: Secondary | ICD-10-CM | POA: Diagnosis not present

## 2022-08-23 DIAGNOSIS — C9201 Acute myeloblastic leukemia, in remission: Secondary | ICD-10-CM | POA: Diagnosis not present

## 2022-08-23 DIAGNOSIS — Z9481 Bone marrow transplant status: Secondary | ICD-10-CM | POA: Diagnosis not present

## 2022-08-27 DIAGNOSIS — M542 Cervicalgia: Secondary | ICD-10-CM | POA: Diagnosis not present

## 2022-09-03 DIAGNOSIS — M542 Cervicalgia: Secondary | ICD-10-CM | POA: Diagnosis not present

## 2022-09-10 DIAGNOSIS — M542 Cervicalgia: Secondary | ICD-10-CM | POA: Diagnosis not present

## 2022-09-17 DIAGNOSIS — M542 Cervicalgia: Secondary | ICD-10-CM | POA: Diagnosis not present

## 2022-09-23 DIAGNOSIS — M542 Cervicalgia: Secondary | ICD-10-CM | POA: Diagnosis not present

## 2022-09-30 DIAGNOSIS — M542 Cervicalgia: Secondary | ICD-10-CM | POA: Diagnosis not present

## 2022-10-07 DIAGNOSIS — M542 Cervicalgia: Secondary | ICD-10-CM | POA: Diagnosis not present

## 2022-10-15 DIAGNOSIS — M542 Cervicalgia: Secondary | ICD-10-CM | POA: Diagnosis not present

## 2022-10-17 DIAGNOSIS — M542 Cervicalgia: Secondary | ICD-10-CM | POA: Diagnosis not present

## 2022-10-22 DIAGNOSIS — M542 Cervicalgia: Secondary | ICD-10-CM | POA: Diagnosis not present

## 2022-10-24 DIAGNOSIS — M542 Cervicalgia: Secondary | ICD-10-CM | POA: Diagnosis not present

## 2022-10-28 DIAGNOSIS — M542 Cervicalgia: Secondary | ICD-10-CM | POA: Diagnosis not present

## 2022-10-30 DIAGNOSIS — M542 Cervicalgia: Secondary | ICD-10-CM | POA: Diagnosis not present

## 2022-11-11 DIAGNOSIS — M542 Cervicalgia: Secondary | ICD-10-CM | POA: Diagnosis not present

## 2022-11-12 ENCOUNTER — Other Ambulatory Visit (HOSPITAL_COMMUNITY)
Admission: RE | Admit: 2022-11-12 | Discharge: 2022-11-12 | Disposition: A | Discharge status: Home / Self Care | Payer: Medicare Other | Source: Ambulatory Visit | Attending: Nurse Practitioner | Admitting: Nurse Practitioner

## 2022-11-12 DIAGNOSIS — Z01419 Encounter for gynecological examination (general) (routine) without abnormal findings: Secondary | ICD-10-CM | POA: Diagnosis present

## 2022-11-12 DIAGNOSIS — L309 Dermatitis, unspecified: Secondary | ICD-10-CM | POA: Diagnosis not present

## 2022-11-12 DIAGNOSIS — Z1151 Encounter for screening for human papillomavirus (HPV): Secondary | ICD-10-CM | POA: Insufficient documentation

## 2022-11-12 DIAGNOSIS — Z124 Encounter for screening for malignant neoplasm of cervix: Secondary | ICD-10-CM | POA: Insufficient documentation

## 2022-11-15 LAB — CYTOLOGY - PAP
Comment: NEGATIVE
Diagnosis: NEGATIVE
High risk HPV: NEGATIVE

## 2022-11-27 DIAGNOSIS — J301 Allergic rhinitis due to pollen: Secondary | ICD-10-CM | POA: Diagnosis not present

## 2022-11-27 DIAGNOSIS — K219 Gastro-esophageal reflux disease without esophagitis: Secondary | ICD-10-CM | POA: Diagnosis not present

## 2022-11-27 DIAGNOSIS — R052 Subacute cough: Secondary | ICD-10-CM | POA: Diagnosis not present

## 2022-11-27 DIAGNOSIS — J3089 Other allergic rhinitis: Secondary | ICD-10-CM | POA: Diagnosis not present

## 2023-01-28 DIAGNOSIS — H6123 Impacted cerumen, bilateral: Secondary | ICD-10-CM | POA: Diagnosis not present

## 2023-01-28 DIAGNOSIS — K5120 Ulcerative (chronic) proctitis without complications: Secondary | ICD-10-CM | POA: Diagnosis not present

## 2023-01-28 DIAGNOSIS — Z0000 Encounter for general adult medical examination without abnormal findings: Secondary | ICD-10-CM | POA: Diagnosis not present

## 2023-01-28 DIAGNOSIS — M542 Cervicalgia: Secondary | ICD-10-CM | POA: Diagnosis not present

## 2023-01-28 DIAGNOSIS — M810 Age-related osteoporosis without current pathological fracture: Secondary | ICD-10-CM | POA: Diagnosis not present

## 2023-01-28 DIAGNOSIS — E559 Vitamin D deficiency, unspecified: Secondary | ICD-10-CM | POA: Diagnosis not present

## 2023-01-28 DIAGNOSIS — E7800 Pure hypercholesterolemia, unspecified: Secondary | ICD-10-CM | POA: Diagnosis not present

## 2023-01-28 DIAGNOSIS — J45909 Unspecified asthma, uncomplicated: Secondary | ICD-10-CM | POA: Diagnosis not present

## 2023-01-28 DIAGNOSIS — C9201 Acute myeloblastic leukemia, in remission: Secondary | ICD-10-CM | POA: Diagnosis not present

## 2023-01-28 DIAGNOSIS — Z79899 Other long term (current) drug therapy: Secondary | ICD-10-CM | POA: Diagnosis not present

## 2023-01-30 ENCOUNTER — Other Ambulatory Visit: Payer: Self-pay | Admitting: Internal Medicine

## 2023-01-30 DIAGNOSIS — M542 Cervicalgia: Secondary | ICD-10-CM

## 2023-02-18 ENCOUNTER — Ambulatory Visit
Admission: RE | Admit: 2023-02-18 | Discharge: 2023-02-18 | Disposition: A | Discharge status: Home / Self Care | Payer: Medicare Other | Source: Ambulatory Visit | Attending: Internal Medicine | Admitting: Internal Medicine

## 2023-02-18 DIAGNOSIS — M542 Cervicalgia: Secondary | ICD-10-CM

## 2023-02-18 DIAGNOSIS — M47812 Spondylosis without myelopathy or radiculopathy, cervical region: Secondary | ICD-10-CM | POA: Diagnosis not present

## 2023-03-11 DIAGNOSIS — M810 Age-related osteoporosis without current pathological fracture: Secondary | ICD-10-CM | POA: Diagnosis not present

## 2023-03-11 DIAGNOSIS — E559 Vitamin D deficiency, unspecified: Secondary | ICD-10-CM | POA: Diagnosis not present

## 2023-03-11 DIAGNOSIS — K219 Gastro-esophageal reflux disease without esophagitis: Secondary | ICD-10-CM | POA: Diagnosis not present

## 2023-03-11 DIAGNOSIS — E7800 Pure hypercholesterolemia, unspecified: Secondary | ICD-10-CM | POA: Diagnosis not present

## 2023-04-17 DIAGNOSIS — M810 Age-related osteoporosis without current pathological fracture: Secondary | ICD-10-CM | POA: Diagnosis not present

## 2023-04-18 DIAGNOSIS — L720 Epidermal cyst: Secondary | ICD-10-CM | POA: Diagnosis not present

## 2023-04-18 DIAGNOSIS — L570 Actinic keratosis: Secondary | ICD-10-CM | POA: Diagnosis not present

## 2023-04-18 DIAGNOSIS — L820 Inflamed seborrheic keratosis: Secondary | ICD-10-CM | POA: Diagnosis not present

## 2023-04-18 DIAGNOSIS — B078 Other viral warts: Secondary | ICD-10-CM | POA: Diagnosis not present

## 2023-04-18 DIAGNOSIS — L821 Other seborrheic keratosis: Secondary | ICD-10-CM | POA: Diagnosis not present

## 2023-04-18 DIAGNOSIS — D225 Melanocytic nevi of trunk: Secondary | ICD-10-CM | POA: Diagnosis not present

## 2023-04-18 DIAGNOSIS — D2222 Melanocytic nevi of left ear and external auricular canal: Secondary | ICD-10-CM | POA: Diagnosis not present

## 2023-04-18 DIAGNOSIS — L814 Other melanin hyperpigmentation: Secondary | ICD-10-CM | POA: Diagnosis not present

## 2023-04-22 DIAGNOSIS — M542 Cervicalgia: Secondary | ICD-10-CM | POA: Diagnosis not present

## 2023-04-28 DIAGNOSIS — M810 Age-related osteoporosis without current pathological fracture: Secondary | ICD-10-CM | POA: Diagnosis not present

## 2023-06-16 ENCOUNTER — Other Ambulatory Visit: Payer: Self-pay | Admitting: Nurse Practitioner

## 2023-06-16 DIAGNOSIS — Z0000 Encounter for general adult medical examination without abnormal findings: Secondary | ICD-10-CM

## 2023-06-17 ENCOUNTER — Ambulatory Visit (HOSPITAL_BASED_OUTPATIENT_CLINIC_OR_DEPARTMENT_OTHER): Attending: Neurosurgery | Admitting: Physical Therapy

## 2023-06-17 ENCOUNTER — Encounter (HOSPITAL_BASED_OUTPATIENT_CLINIC_OR_DEPARTMENT_OTHER): Payer: Self-pay | Admitting: Physical Therapy

## 2023-06-17 ENCOUNTER — Other Ambulatory Visit: Payer: Self-pay

## 2023-06-17 DIAGNOSIS — M542 Cervicalgia: Secondary | ICD-10-CM | POA: Diagnosis not present

## 2023-06-17 DIAGNOSIS — M62838 Other muscle spasm: Secondary | ICD-10-CM | POA: Insufficient documentation

## 2023-06-17 NOTE — Therapy (Signed)
 OUTPATIENT PHYSICAL THERAPY CERVICAL EVALUATION   Patient Name: Barbara VOYTKO MRN: [id number] DOB:10-Mar-1955, 68 y.o., female Today's Date: 06/18/2023  END OF SESSION:  PT End of Session - 06/17/23 1403     Visit Number 1    Number of Visits 16    Date for PT Re-Evaluation 08/12/23    PT Start Time 1345    PT Stop Time 1428    PT Time Calculation (min) 43 min    Activity Tolerance Patient tolerated treatment well    Behavior During Therapy [facility] for tasks assessed/performed             Past Medical History:  Diagnosis Date   Allergy    AML (acute myeloblastic leukemia) (HCC) 2014, 2018   Asthma    Blood transfusion without reported diagnosis    Cancer (HCC)    Complication of anesthesia    Leukemia (HCC)    PONV (postoperative nausea and vomiting)    Past Surgical History:  Procedure Laterality Date   KNEE ARTHROSCOPY WITH PATELLAR TENDON REPAIR Left 12/20/2021   Procedure: LEFT KNEE EXCISION OF PATELLAR FRACTURE FRAGMENT AND KNEE  PATELLAR TENDON REPAIR;  Surgeon: Sammye Cristal, MD;  Location: [facility];  Service: Orthopedics;  Laterality: Left;   PATELLAR TENDON REPAIR Left 12/20/2021   Procedure: PATELLAR TENDON REPAIR;  Surgeon: Sammye Cristal, MD;  Location: [facility];  Service: Orthopedics;  Laterality: Left;   tendon Left    Hand   TENDON REPAIR Left 2007   hand   Patient Active Problem List   Diagnosis Date Noted   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   AML (acute myeloblastic leukemia) (HCC) 06/24/2016   Acute myeloid leukemia (HCC) 01/09/2013    PCP: [facility]   REFERRING PROVIDER: Garnette Ka   REFERRING DIAG: Cervicalgia  THERAPY DIAG:  Cervicalgia  Other muscle spasm  Rationale for Evaluation and Treatment: Rehabilitation  ONSET DATE: 1 year prior  SUBJECTIVE:                                                                                                                                                                                                          SUBJECTIVE STATEMENT: Patient had a insidious onset of neck stiffness and pain approximately 1 year ago.  She had a episode of physical therapy which helped but did not resolve the pain.  She continues to feel significant tightness in her neck.  Send as painful as it once was but it still has continued tightness.  She feels like the tightness comes and  goes.  She cannot pinpoint a particular action that causes increased tightness.   Hand dominance: Right  PERTINENT HISTORY:  Asthma, leukemia ( full remission)  patella tendon repair   PAIN:  Are you having pain? Yes: NPRS scale: 0/10 right now 3/10 at worst  Pain location: more on right but both sides  Pain description: aching  Aggravating factors: just comes and goes  Relieving factors: soft tissue mobilization   PRECAUTIONS: None  RED FLAGS: None     WEIGHT BEARING RESTRICTIONS: No  FALLS:  Has patient fallen in last 6 months? No  LIVING ENVIRONMENT: N/A OCCUPATION:  Retired  Social research officer, government     PLOF: Independent  PATIENT GOALS:  To have less pain   NEXT MD VISIT:  Nothing   OBJECTIVE:  Note: Objective measures were completed at Evaluation unless otherwise noted.  DIAGNOSTIC FINDINGS:   Cervical MRI:  IMPRESSION: Multilevel cervical spondylosis, worst at C3-4 where there is moderate right neural foraminal narrowing and at C4-5 where there is moderate left neural foraminal narrowing.  PATIENT SURVEYS:  NDI 24  COGNITION: Overall cognitive status: Within functional limits for tasks assessed  SENSATION: WFL  POSTURE: forward head  PALPATION: Significant spasming in upper trap and cervical paraspinals   CERVICAL ROM:   Active ROM A/PROM (deg) eval  Flexion No pain   Extension Mild pain   Right lateral flexion   Left lateral flexion   Right rotation 58  Left rotation 60   (Blank rows = not tested)  UPPER EXTREMITY ROM:  Active ROM  Right eval Left eval  Shoulder flexion Within normal limits   Shoulder extension    Shoulder abduction    Shoulder adduction    Shoulder extension    Shoulder internal rotation Within normal limits   Shoulder external rotation Within normal limits   Elbow flexion    Elbow extension    Wrist flexion    Wrist extension    Wrist ulnar deviation    Wrist radial deviation    Wrist pronation    Wrist supination     (Blank rows = not tested)  UPPER EXTREMITY MMT:  MMT Right eval Left eval  Shoulder flexion 4+   Shoulder extension    Shoulder abduction    Shoulder adduction    Shoulder extension    Shoulder internal rotation 4+   Shoulder external rotation 4+   Middle trapezius    Lower trapezius    Elbow flexion    Elbow extension    Wrist flexion    Wrist extension    Wrist ulnar deviation    Wrist radial deviation    Wrist pronation    Wrist supination    Grip strength     (Blank rows = not tested)  TREATMENT DATE: Manual:  Trigger point release to cervical spine  Sub-occipital release  Genetle cervical stration  C2-c3 UPA  Self trigger point release     There-ex: Upper trap stretch 3x20 sec hold  Levator stretch 3x20 sec hold   Review of HEP  PATIENT EDUCATION:  Education details: HEP, symptom management Person educated: Patient Education method: Explanation, Demonstration, Tactile cues, Verbal cues, and Handouts Education comprehension: verbalized understanding, returned demonstration, verbal cues required, tactile cues required, and needs further education  HOME EXERCISE PROGRAM: Access Code: [email or web address] URL: [email or web address] Date: 06/18/2023 Prepared by: Signa Drier  Exercises - Gentle Levator Scapulae Stretch  - 2-3 x daily - 7 x weekly - 3 sets - 10 reps - Seated Upper Trapezius Stretch  - 2-3 x daily - 7 x weekly -  3 sets - 10 reps - Shoulder extension with resistance - Neutral  - 1 x daily - 7 x weekly - 3 sets - 10 reps - Standing Bilateral Low Shoulder Row with Anchored Resistance  - 1 x daily - 7 x weekly - 3 sets - 10 reps - Theracane Over Shoulder  - 1 x daily - 7 x weekly - 3 sets - 10 reps  ASSESSMENT:  CLINICAL IMPRESSION: Patient is a 68 year old female with 1 year history of cervical pain and tightness located on the right side.  She has limited cervical mobility.  She has significant spasming in her upper trap and into her cervical spine.  Her MRI RI shows multilevel degeneration.  She would benefit from skilled therapy to improve cervical mobility, decreased overall cervical compression, and increased postural stability.   OBJECTIVE IMPAIRMENTS: decreased ROM, hypomobility, increased fascial restrictions, postural dysfunction, and pain.   ACTIVITY LIMITATIONS: lifting, reach over head, and turning head while driving   PARTICIPATION LIMITATIONS: driving and shopping  PERSONAL FACTORS: 1 comorbidity: past history of Leukemia  are also affecting patient's functional outcome.   REHAB POTENTIAL: Excellent  CLINICAL DECISION MAKING: Stable/uncomplicated  EVALUATION COMPLEXITY: Low   GOALS: Goals reviewed with patient? Yes  SHORT TERM GOALS: Target date:   Patient will increase cervical rotation by 5 degrees bilateral Baseline:  Goal status: INITIAL  2.  Patient will demonstrate 5 out of 5 gross right upper extremity strength Baseline:  Goal status: INITIAL  3.  Patient will be independent with basic HEP Baseline:  Goal status: INITIAL    LONG TERM GOALS: Target date: 08/13/2023    Patient will report no pain and stiffness when she wakes up in the morning Baseline:  Goal status: INITIAL  2.  Patient will be independent with complete exercise program Baseline:  Goal status: INITIAL  3.  Patient report no spasming or tightness with daily tasks Baseline:  Goal  status: INITIAL    PLAN:  PT FREQUENCY: 1-2x/week  PT DURATION: 8 weeks  PLANNED INTERVENTIONS:  97110-Therapeutic exercises, 97530- Therapeutic activity, [id number]- Neuromuscular re-education, 97535- Self Care, 54098- Manual therapy, , [id number]- Aquatic Therapy, 97014- Electrical stimulation (unattended), 97035- Ultrasound, Patient/Family education, Taping, Dry Needling, , Cryotherapy, and Moist heat   PLAN FOR NEXT SESSION: discuss trigger point dry needling. Consider upper trap inhibition taping.  Patient is a member of the gym.  Continue review gym exercises.  Consider seated band postural stability series.  Reviewed stretches and exercises from HEP.  Continue with manual therapy.  Patient has a large spasm in the right side of her cervical spine.   Kitty Perkins, PT 06/18/2023, 3:31 PM  [employer] Medicare Authorization form:  Date 06/17/2023 See Hoy Mackintosh for onset date, symptom description, and how symptoms began.   Average pain intensity:      Last 24 hours: 0      Past week: 3 How often have your symptoms interfered with your usual daily activities? 5 How  is your condition changing, since care began at this facility? 5 In general, would you say your overall health right now is... 5

## 2023-06-20 ENCOUNTER — Encounter (HOSPITAL_BASED_OUTPATIENT_CLINIC_OR_DEPARTMENT_OTHER): Payer: Self-pay

## 2023-06-20 ENCOUNTER — Ambulatory Visit (HOSPITAL_BASED_OUTPATIENT_CLINIC_OR_DEPARTMENT_OTHER)

## 2023-06-20 DIAGNOSIS — M62838 Other muscle spasm: Secondary | ICD-10-CM

## 2023-06-20 DIAGNOSIS — M542 Cervicalgia: Secondary | ICD-10-CM

## 2023-06-20 NOTE — Therapy (Signed)
 OUTPATIENT PHYSICAL THERAPY CERVICAL TREATMENT   Patient Name: Barbara Benitez MRN: [id number] DOB:Feb 27, 1955, 68 y.o., female Today's Date: 06/20/2023  END OF SESSION:  PT End of Session - 06/20/23 1417     Visit Number 2    Number of Visits 16    Date for PT Re-Evaluation 08/12/23    PT Start Time 1303    PT Stop Time 1347    PT Time Calculation (min) 44 min    Activity Tolerance Patient tolerated treatment well    Behavior During Therapy [facility] for tasks assessed/performed              Past Medical History:  Diagnosis Date   Allergy    AML (acute myeloblastic leukemia) (HCC) 2014, 2018   Asthma    Blood transfusion without reported diagnosis    Cancer (HCC)    Complication of anesthesia    Leukemia (HCC)    PONV (postoperative nausea and vomiting)    Past Surgical History:  Procedure Laterality Date   KNEE ARTHROSCOPY WITH PATELLAR TENDON REPAIR Left 12/20/2021   Procedure: LEFT KNEE EXCISION OF PATELLAR FRACTURE FRAGMENT AND KNEE  PATELLAR TENDON REPAIR;  Surgeon: Sammye Cristal, MD;  Location: [facility];  Service: Orthopedics;  Laterality: Left;   PATELLAR TENDON REPAIR Left 12/20/2021   Procedure: PATELLAR TENDON REPAIR;  Surgeon: Sammye Cristal, MD;  Location: [facility];  Service: Orthopedics;  Laterality: Left;   tendon Left    Hand   TENDON REPAIR Left 2007   hand   Patient Active Problem List   Diagnosis Date Noted   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 Also c/o HA today. Feels HA may also be due to her contact lenses. Sees ophthalmologist soon.   Eval: Patient had a insidious onset of neck stiffness and pain approximately 1 year ago.  She had a episode of physical therapy which helped but did not resolve the pain.  She continues  to feel significant tightness in her neck.  Send as painful as it once was but it still has continued tightness.  She feels like the tightness comes and goes.  She cannot pinpoint a particular action that causes increased tightness.   Hand dominance: Right  PERTINENT HISTORY:  Asthma, leukemia ( full remission)  patella tendon repair   PAIN:  Are you having pain? Yes: NPRS scale: 0/10 right now 3/10 at worst  Pain location: more on right but both sides  Pain description: aching  Aggravating factors: just comes and goes  Relieving factors: soft tissue mobilization   PRECAUTIONS: None  RED FLAGS: None     WEIGHT BEARING RESTRICTIONS: No  FALLS:  Has patient fallen in last 6 months? No  LIVING ENVIRONMENT: N/A OCCUPATION:  Retired  Social research officer, government     PLOF: Independent  PATIENT GOALS:  To have less pain   NEXT MD VISIT:  Nothing   OBJECTIVE:  Note: Objective measures were completed at Evaluation unless otherwise noted.  DIAGNOSTIC FINDINGS:   Cervical MRI:  IMPRESSION: Multilevel cervical spondylosis, worst at C3-4 where there is moderate right neural foraminal narrowing and at C4-5 where there is moderate left neural foraminal narrowing.  PATIENT SURVEYS:  NDI 24  COGNITION: Overall cognitive status: Within functional limits for tasks assessed  SENSATION: WFL  POSTURE: forward head  PALPATION: Significant spasming in upper trap and cervical paraspinals   CERVICAL ROM:   Active ROM A/PROM (deg) eval  Flexion No pain   Extension Mild pain   Right  lateral flexion   Left lateral flexion   Right rotation 58  Left rotation 60   (Blank rows = not tested)  UPPER EXTREMITY ROM:  Active ROM Right eval Left eval  Shoulder flexion Within normal limits   Shoulder extension    Shoulder abduction    Shoulder adduction    Shoulder extension    Shoulder internal rotation Within normal limits   Shoulder external rotation Within normal limits   Elbow flexion    Elbow extension    Wrist flexion    Wrist extension    Wrist ulnar deviation    Wrist radial deviation    Wrist pronation    Wrist supination     (Blank rows = not tested)  UPPER EXTREMITY MMT:  MMT Right eval Left eval  Shoulder flexion 4+   Shoulder extension    Shoulder abduction    Shoulder adduction    Shoulder extension    Shoulder internal rotation 4+   Shoulder external rotation 4+   Middle trapezius    Lower trapezius    Elbow flexion    Elbow extension    Wrist flexion    Wrist extension    Wrist ulnar deviation    Wrist radial deviation    Wrist pronation    Wrist supination    Grip strength     (Blank rows = not tested)  TREATMENT DATE:  06/20/23: STM to bil UT, cervical ps, sub occipitals UT stretch Cervical flexion stretch with hands behind head SCM stretch (did not feel effective) Pec stretch doorway Quadruped thoracic rotation (hand behind head) Sidelying open book pec stretch HEP update  Eval: Manual:  Trigger point release to cervical spine  Sub-occipital release  Genetle cervical stration  C2-c3 UPA  Self trigger point release     There-ex: Upper trap stretch 3x20 sec hold  Levator stretch 3x20 sec hold   Review of HEP  PATIENT EDUCATION:  Education details: HEP, symptom management Person educated: Patient Education method: Explanation, Demonstration, Tactile cues, Verbal cues, and Handouts Education  comprehension: verbalized understanding, returned demonstration, verbal cues required, tactile cues required, and needs further education  HOME EXERCISE PROGRAM: Access Code: [email or web address] URL: [email or web address] Date: 06/18/2023 Prepared by: Signa Drier  Exercises - Gentle Levator Scapulae Stretch  - 2-3 x daily - 7 x weekly - 3 sets - 10 reps - Seated Upper Trapezius Stretch  - 2-3 x daily - 7 x weekly - 3 sets - 10 reps - Shoulder extension with resistance - Neutral  - 1 x daily - 7 x weekly - 3 sets - 10 reps - Standing Bilateral Low Shoulder Row with Anchored Resistance  - 1 x daily - 7 x weekly - 3 sets - 10 reps - Theracane Over Shoulder  - 1 x daily - 7 x weekly - 3 sets - 10 reps  ASSESSMENT:  CLINICAL IMPRESSION:  Pt with significant restrictions present throughout cervical region and upper trap. Also notabley tight into pecs. Worked on Metall[employer]ist in additionto pec stretching and thoracic rotation interventions. Disccused postural awareness and corrections throughout the day. Advised in use of heat for symptoms management. Updated HEP to include additional stretching. Reviewed use of theracane and lacrosse balls for self STM.  Eval: Patient is a 68 year old female with 1 year history of cervical pain and tightness located on the right side.  She has limited cervical mobility.  She has significant spasming in her upper trap and into her cervical spine.  Her MRI RI shows multilevel degeneration.  She would benefit from skilled therapy to improve cervical mobility, decreased overall cervical compression, and increased postural stability.   OBJECTIVE IMPAIRMENTS: decreased ROM, hypomobility, increased fascial restrictions, postural dysfunction, and pain.   ACTIVITY LIMITATIONS: lifting, reach over head, and turning head while driving   PARTICIPATION LIMITATIONS: driving and shopping  PERSONAL FACTORS: 1 comorbidity: past history of Leukemia  are also affecting  patient's functional outcome.   REHAB POTENTIAL: Excellent  CLINICAL DECISION MAKING: Stable/uncomplicated  EVALUATION COMPLEXITY: Low   GOALS: Goals reviewed with patient? Yes  SHORT TERM GOALS: Target date:   Patient will increase cervical rotation by 5 degrees bilateral Baseline:  Goal status: INITIAL  2.  Patient will demonstrate 5 out of 5 gross right upper extremity strength Baseline:  Goal status: INITIAL  3.  Patient will be independent with basic HEP Baseline:  Goal status: INITIAL    LONG TERM GOALS: Target date: 08/13/2023    Patient will report no pain and stiffness when she wakes up in the morning Baseline:  Goal status: INITIAL  2.  Patient will be independent with complete exercise program Baseline:  Goal status: INITIAL  3.  Patient report no spasming or tightness with daily tasks Baseline:  Goal status: INITIAL    PLAN:  PT FREQUENCY: 1-2x/week  PT DURATION: 8 weeks  PLANNED INTERVENTIONS:  97110-Therapeutic exercises, 97530- Therapeutic activity, [id number]- Neuromuscular re-education, 97535- Self Care, 54098- Manual therapy, , [id number]- Aquatic Therapy, 97014- Electrical stimulation (unattended), 97035- Ultrasound, Patient/Family education, Taping, Dry Needling, , Cryotherapy, and Moist heat   PLAN FOR NEXT SESSION: discuss trigger point dry needling. Consider upper trap inhibition taping.  Patient is a member of the gym.  Continue review gym exercises.  Consider seated band postural stability series.  Reviewed stretches and exercises from HEP.  Continue with manual therapy.  Patient has a large spasm in the right side of her cervical spine.  Fronie Jewett Alpha Chouinard, PTA 06/20/2023, 2:59 PM  [employer] Medicare Authorization form:  Date 06/17/2023 See Hoy Mackintosh for onset date, symptom description, and how symptoms began.   Average pain intensity:      Last 24 hours: 0      Past week: 3 How often have your symptoms interfered with your usual daily  activities? 5 How is your condition changing, since care began at this facility? 5 In general, would you say your overall health right now is... 5

## 2023-06-24 ENCOUNTER — Encounter (HOSPITAL_BASED_OUTPATIENT_CLINIC_OR_DEPARTMENT_OTHER): Payer: Self-pay

## 2023-06-24 ENCOUNTER — Ambulatory Visit (HOSPITAL_BASED_OUTPATIENT_CLINIC_OR_DEPARTMENT_OTHER): Payer: Self-pay

## 2023-06-24 DIAGNOSIS — M62838 Other muscle spasm: Secondary | ICD-10-CM

## 2023-06-24 DIAGNOSIS — M542 Cervicalgia: Secondary | ICD-10-CM

## 2023-06-24 DIAGNOSIS — K582 Mixed irritable bowel syndrome: Secondary | ICD-10-CM | POA: Diagnosis not present

## 2023-06-24 DIAGNOSIS — R195 Other fecal abnormalities: Secondary | ICD-10-CM | POA: Diagnosis not present

## 2023-06-24 DIAGNOSIS — R1030 Lower abdominal pain, unspecified: Secondary | ICD-10-CM | POA: Diagnosis not present

## 2023-06-24 NOTE — Therapy (Signed)
 OUTPATIENT PHYSICAL THERAPY CERVICAL TREATMENT   Patient Name: Barbara Benitez MRN: [id number] DOB:May 03, 1955, 68 y.o., female Today's Date: 06/24/2023  END OF SESSION:  PT End of Session - 06/24/23 0940     Visit Number 3    Number of Visits 16    Date for PT Re-Evaluation 08/12/23    PT Start Time 0937    PT Stop Time 1015    PT Time Calculation (min) 38 min    Activity Tolerance Patient tolerated treatment well    Behavior During Therapy [facility] for tasks assessed/performed               Past Medical History:  Diagnosis Date   Allergy    AML (acute myeloblastic leukemia) (HCC) 2014, 2018   Asthma    Blood transfusion without reported diagnosis    Cancer (HCC)    Complication of anesthesia    Leukemia (HCC)    PONV (postoperative nausea and vomiting)    Past Surgical History:  Procedure Laterality Date   KNEE ARTHROSCOPY WITH PATELLAR TENDON REPAIR Left 12/20/2021   Procedure: LEFT KNEE EXCISION OF PATELLAR FRACTURE FRAGMENT AND KNEE  PATELLAR TENDON REPAIR;  Surgeon: Sammye Cristal, MD;  Location: [facility];  Service: Orthopedics;  Laterality: Left;   PATELLAR TENDON REPAIR Left 12/20/2021   Procedure: PATELLAR TENDON REPAIR;  Surgeon: Sammye Cristal, MD;  Location: [facility];  Service: Orthopedics;  Laterality: Left;   tendon Left    Hand   TENDON REPAIR Left 2007   hand   Patient Active Problem List   Diagnosis Date Noted   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   AML (acute myeloblastic leukemia) (HCC) 06/24/2016   Acute myeloid leukemia (HCC) 01/09/2013    PCP: [facility]   REFERRING PROVIDER: Garnette Ka   REFERRING DIAG: Cervicalgia  THERAPY DIAG:  Cervicalgia  Other muscle spasm  Rationale for Evaluation and Treatment: Rehabilitation  ONSET DATE: 1 year prior  SUBJECTIVE:                                                                                                                                                                                                          SUBJECTIVE STATEMENT:  Pt reports only having HA on Sunday with some neck pain. Some mid back soreness after last session. Overall doing well.   Eval: Patient had a insidious onset of neck stiffness and pain approximately 1 year ago.  She had a episode of physical therapy which helped but did not resolve the pain.  She continues to feel  significant tightness in her neck.  Send as painful as it once was but it still has continued tightness.  She feels like the tightness comes and goes.  She cannot pinpoint a particular action that causes increased tightness.   Hand dominance: Right  PERTINENT HISTORY:  Asthma, leukemia ( full remission)  patella tendon repair   PAIN:  Are you having pain? Yes: NPRS scale: 0/10 right now 3/10 at worst  Pain location: more on right but both sides  Pain description: aching  Aggravating factors: just comes and goes  Relieving factors: soft tissue mobilization   PRECAUTIONS: None  RED FLAGS: None     WEIGHT BEARING RESTRICTIONS: No  FALLS:  Has patient fallen in last 6 months? No  LIVING ENVIRONMENT: N/A OCCUPATION:  Retired  Social research officer, government     PLOF: Independent  PATIENT GOALS:  To have less pain   NEXT MD VISIT:  Nothing   OBJECTIVE:  Note: Objective measures were completed at Evaluation unless otherwise noted.  DIAGNOSTIC FINDINGS:   Cervical MRI:  IMPRESSION: Multilevel cervical spondylosis, worst at C3-4 where there is moderate right neural foraminal narrowing and at C4-5 where there is moderate left neural foraminal narrowing.  PATIENT SURVEYS:  NDI 24  COGNITION: Overall cognitive status: Within functional limits for tasks assessed  SENSATION: WFL  POSTURE: forward head  PALPATION: Significant spasming in upper trap and cervical paraspinals   CERVICAL ROM:   Active ROM A/PROM (deg) eval  Flexion No pain   Extension Mild pain   Right lateral flexion    Left lateral flexion   Right rotation 58  Left rotation 60   (Blank rows = not tested)  UPPER EXTREMITY ROM:  Active ROM Right eval Left eval  Shoulder flexion Within normal limits   Shoulder extension    Shoulder abduction    Shoulder adduction    Shoulder extension    Shoulder internal rotation Within normal limits   Shoulder external rotation Within normal limits   Elbow flexion    Elbow extension    Wrist flexion    Wrist extension    Wrist ulnar deviation    Wrist radial deviation    Wrist pronation    Wrist supination     (Blank rows = not tested)  UPPER EXTREMITY MMT:  MMT Right eval Left eval  Shoulder flexion 4+   Shoulder extension    Shoulder abduction    Shoulder adduction    Shoulder extension    Shoulder internal rotation 4+   Shoulder external rotation 4+   Middle trapezius    Lower trapezius    Elbow flexion    Elbow extension    Wrist flexion    Wrist extension    Wrist ulnar deviation    Wrist radial deviation    Wrist pronation    Wrist supination    Grip strength     (Blank rows = not tested)  TREATMENT DATE:  06/24/23: STM to bil UT, cervical ps, levator scap Static cupping to bil Levator scap with and without repeated cervical sidebending UT stretch Sidelying open book pec stretch Bil ER RTB x15 with cues Supine horizontal abduction RTB x15 with cues Posterior shoulder rolls       06/20/23: STM to bil UT, cervical ps, sub occipitals UT stretch Cervical flexion stretch with hands behind head SCM stretch (did not feel effective) Pec stretch doorway Quadruped thoracic rotation (hand behind head) Sidelying open book pec stretch HEP update  Eval: Manual:  Trigger point  release to cervical spine  Sub-occipital release  Genetle cervical stration  C2-c3 UPA  Self trigger point release     There-ex: Upper trap stretch 3x20 sec hold  Levator stretch 3x20 sec hold   Review of HEP                                                                                                                 PATIENT EDUCATION:  Education details: HEP, symptom management Person educated: Patient Education method: Explanation, Demonstration, Tactile cues, Verbal cues, and Handouts Education comprehension: verbalized understanding, returned demonstration, verbal cues required, tactile cues required, and needs further education  HOME EXERCISE PROGRAM: Access Code: [email or web address] URL: [email or web address] Date: 06/18/2023 Prepared by: Signa Drier  Exercises - Gentle Levator Scapulae Stretch  - 2-3 x daily - 7 x weekly - 3 sets - 10 reps - Seated Upper Trapezius Stretch  - 2-3 x daily - 7 x weekly - 3 sets - 10 reps - Shoulder extension with resistance - Neutral  - 1 x daily - 7 x weekly - 3 sets - 10 reps - Standing Bilateral Low Shoulder Row with Anchored Resistance  - 1 x daily - 7 x weekly - 3 sets - 10 reps - Theracane Over Shoulder  - 1 x daily - 7 x weekly - 3 sets - 10 reps  ASSESSMENT:  CLINICAL IMPRESSION: Patient with ongoing restrictions from bilateral levator Scap and UT muscles.  Greater restrictions are noted on the right side.  Trialed cupping with static technique to decrease trigger points present here.  Good tolerance for this with minor bruising present.  Continue to work on therex and NMR for postural re-ed. Reviewed s/l pec stretch for correct performance. Also reviewed postural awareness techniques throughout the day. Will continue to work on decreasing trigger points and improving postural re-ed and strength.  Eval: Patient is a 68 year old female with 1 year history of cervical pain and tightness located on the right side.  She has limited cervical mobility.  She has significant spasming in her upper trap and into her cervical spine.  Her MRI RI shows multilevel degeneration.  She would benefit from skilled therapy to improve cervical mobility, decreased overall cervical compression, and  increased postural stability.   OBJECTIVE IMPAIRMENTS: decreased ROM, hypomobility, increased fascial restrictions, postural dysfunction, and pain.   ACTIVITY LIMITATIONS: lifting, reach over head, and turning head while driving   PARTICIPATION LIMITATIONS: driving and shopping  PERSONAL FACTORS: 1 comorbidity: past history of Leukemia  are also affecting patient's functional outcome.   REHAB POTENTIAL: Excellent  CLINICAL DECISION MAKING: Stable/uncomplicated  EVALUATION COMPLEXITY: Low   GOALS: Goals reviewed with patient? Yes  SHORT TERM GOALS: Target date:   Patient will increase cervical rotation by 5 degrees bilateral Baseline:  Goal status: INITIAL  2.  Patient will demonstrate 5 out of 5 gross right upper extremity strength Baseline:  Goal status: INITIAL  3.  Patient will be independent with basic HEP Baseline:  Goal status: INITIAL    LONG TERM  GOALS: Target date: 08/13/2023    Patient will report no pain and stiffness when she wakes up in the morning Baseline:  Goal status: INITIAL  2.  Patient will be independent with complete exercise program Baseline:  Goal status: INITIAL  3.  Patient report no spasming or tightness with daily tasks Baseline:  Goal status: INITIAL    PLAN:  PT FREQUENCY: 1-2x/week  PT DURATION: 8 weeks  PLANNED INTERVENTIONS:  97110-Therapeutic exercises, 97530- Therapeutic activity, [id number]- Neuromuscular re-education, 97535- Self Care, [id number]- Manual therapy, , [id number]- Aquatic Therapy, [id number]- Electrical stimulation (unattended), 97035- Ultrasound, Patient/Family education, Taping, Dry Needling, , Cryotherapy, and Moist heat   PLAN FOR NEXT SESSION: discuss trigger point dry needling. Consider upper trap inhibition taping.  Patient is a member of the gym.  Continue review gym exercises.  Consider seated band postural stability series.  Reviewed stretches and exercises from HEP.  Continue with manual therapy.  Patient has a  large spasm in the right side of her cervical spine.   Fronie Jewett Yatzari Jonsson, PTA 06/24/2023, 11:28 AM  [employer] Medicare Authorization form:  Date 06/17/2023 See Hoy Mackintosh for onset date, symptom description, and how symptoms began.   Average pain intensity:      Last 24 hours: 0      Past week: 3 How often have your symptoms interfered with your usual daily activities? 5 How is your condition changing, since care began at this facility? 5 In general, would you say your overall health right now is... 5

## 2023-07-02 ENCOUNTER — Encounter (HOSPITAL_BASED_OUTPATIENT_CLINIC_OR_DEPARTMENT_OTHER): Payer: Self-pay

## 2023-07-02 ENCOUNTER — Ambulatory Visit (HOSPITAL_BASED_OUTPATIENT_CLINIC_OR_DEPARTMENT_OTHER)

## 2023-07-02 DIAGNOSIS — M542 Cervicalgia: Secondary | ICD-10-CM | POA: Diagnosis not present

## 2023-07-02 DIAGNOSIS — M62838 Other muscle spasm: Secondary | ICD-10-CM

## 2023-07-02 NOTE — Therapy (Signed)
 OUTPATIENT PHYSICAL THERAPY CERVICAL TREATMENT   Patient Name: Barbara Benitez MRN: [id number] DOB:1955-12-11, 68 y.o., female Today's Date: 07/02/2023  END OF SESSION:  PT End of Session - 07/02/23 1305     Visit Number 4    Number of Visits 16    Date for PT Re-Evaluation 08/12/23    PT Start Time 1304    PT Stop Time 1345    PT Time Calculation (min) 41 min    Activity Tolerance Patient tolerated treatment well    Behavior During Therapy [facility] for tasks assessed/performed             Past Medical History:  Diagnosis Date   Allergy    AML (acute myeloblastic leukemia) (HCC) 2014, 2018   Asthma    Blood transfusion without reported diagnosis    Cancer (HCC)    Complication of anesthesia    Leukemia (HCC)    PONV (postoperative nausea and vomiting)    Past Surgical History:  Procedure Laterality Date   KNEE ARTHROSCOPY WITH PATELLAR TENDON REPAIR Left 12/20/2021   Procedure: LEFT KNEE EXCISION OF PATELLAR FRACTURE FRAGMENT AND KNEE  PATELLAR TENDON REPAIR;  Surgeon: Sammye Cristal, MD;  Location: [facility];  Service: Orthopedics;  Laterality: Left;   PATELLAR TENDON REPAIR Left 12/20/2021   Procedure: PATELLAR TENDON REPAIR;  Surgeon: Sammye Cristal, MD;  Location: [facility];  Service: Orthopedics;  Laterality: Left;   tendon Left    Hand   TENDON REPAIR Left 2007   hand   Patient Active Problem List   Diagnosis Date Noted   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   AML (acute myeloblastic leukemia) (HCC) 06/24/2016   Acute myeloid leukemia (HCC) 01/09/2013    [facility]P: [facility]   REFERRING PROVIDER: Garnette Ka   REFERRING DIAG: Cervicalgia  THERAPY DIAG:  Cervicalgia  Other muscle spasm  Rationale for Evaluation and Treatment: Rehabilitation  ONSET DATE: 1 year prior  SUBJECTIVE:                                                                                                                                                                                                          SUBJECTIVE STATEMENT:  Pt reports having one day while she was traveling that her neck really hurt. Nothing I did helped.   Eval: Patient had a insidious onset of neck stiffness and pain approximately 1 year ago.  She had a episode of physical therapy which helped but did not resolve the pain.  She continues to feel significant tightness in her neck.  Send as painful as it once was but it still has continued tightness.  She feels like the tightness comes and goes.  She cannot pinpoint a particular action that causes increased tightness.   Hand dominance: Right  PERTINENT HISTORY:  Asthma, leukemia ( full remission)  patella tendon repair   PAIN:  Are you having pain? Yes: NPRS scale: 0/10 right now 3/10 at worst  Pain location: more on right but both sides  Pain description: aching  Aggravating factors: just comes and goes  Relieving factors: soft tissue mobilization   PRECAUTIONS: None  RED FLAGS: None     WEIGHT BEARING RESTRICTIONS: No  FALLS:  Has patient fallen in last 6 months? No  LIVING ENVIRONMENT: N/A OCCUPATION:  Retired  Social research officer, government     PLOF: Independent  PATIENT GOALS:  To have less pain   NEXT MD VISIT:  Nothing   OBJECTIVE:  Note: Objective measures were completed at Evaluation unless otherwise noted.  DIAGNOSTIC FINDINGS:   Cervical MRI:  IMPRESSION: Multilevel cervical spondylosis, worst at C3-4 where there is moderate right neural foraminal narrowing and at C4-5 where there is moderate left neural foraminal narrowing.  PATIENT SURVEYS:  NDI 24  COGNITION: Overall cognitive status: Within functional limits for tasks assessed  SENSATION: WFL  POSTURE: forward head  PALPATION: Significant spasming in upper trap and cervical paraspinals   CERVICAL ROM:   Active ROM A/PROM (deg) eval  Flexion No pain   Extension Mild pain   Right lateral flexion   Left lateral flexion    Right rotation 58  Left rotation 60   (Blank rows = not tested)  UPPER EXTREMITY ROM:  Active ROM Right eval Left eval  Shoulder flexion Within normal limits   Shoulder extension    Shoulder abduction    Shoulder adduction    Shoulder extension    Shoulder internal rotation Within normal limits   Shoulder external rotation Within normal limits   Elbow flexion    Elbow extension    Wrist flexion    Wrist extension    Wrist ulnar deviation    Wrist radial deviation    Wrist pronation    Wrist supination     (Blank rows = not tested)  UPPER EXTREMITY MMT:  MMT Right eval Left eval  Shoulder flexion 4+   Shoulder extension    Shoulder abduction    Shoulder adduction    Shoulder extension    Shoulder internal rotation 4+   Shoulder external rotation 4+   Middle trapezius    Lower trapezius    Elbow flexion    Elbow extension    Wrist flexion    Wrist extension    Wrist ulnar deviation    Wrist radial deviation    Wrist pronation    Wrist supination    Grip strength     (Blank rows = not tested)  TREATMENT DATE:  07/02/23: STM to bil UT, cervical ps, sub occipitals, levator scap, scalenes UT stretch 30sec x2ea Cervical flexion stretch with hands laced behind head 20sec x3 Sidelying open book pec stretch Bil ER RTB 2x10 with cues Supine horizontal abduction RTB 2x10 with cues Posterior shoulder rolls   06/24/23: STM to bil UT, cervical ps, levator scap Static cupping to bil Levator scap with and without repeated cervical sidebending UT stretch Sidelying open book pec stretch Bil ER RTB x15 with cues Supine horizontal abduction RTB x15 with cues Posterior shoulder rolls       06/20/23: STM to  bil UT, cervical ps, sub occipitals UT stretch Cervical flexion stretch with hands behind head SCM stretch (did not feel effective) Pec stretch doorway Quadruped thoracic rotation (hand behind head) Sidelying open book pec stretch HEP  update  Eval: Manual:  Trigger point release to cervical spine  Sub-occipital release  Genetle cervical stration  C2-c3 UPA  Self trigger point release     There-ex: Upper trap stretch 3x20 sec hold  Levator stretch 3x20 sec hold   Review of HEP                                                                                                                PATIENT EDUCATION:  Education details: HEP, symptom management Person educated: Patient Education method: Explanation, Demonstration, Tactile cues, Verbal cues, and Handouts Education comprehension: verbalized understanding, returned demonstration, verbal cues required, tactile cues required, and needs further education  HOME EXERCISE PROGRAM: Access Code: [email or web address] URL: [email or web address] Date: 06/18/2023 Prepared by: Signa Drier  Exercises - Gentle Levator Scapulae Stretch  - 2-3 x daily - 7 x weekly - 3 sets - 10 reps - Seated Upper Trapezius Stretch  - 2-3 x daily - 7 x weekly - 3 sets - 10 reps - Shoulder extension with resistance - Neutral  - 1 x daily - 7 x weekly - 3 sets - 10 reps - Standing Bilateral Low Shoulder Row with Anchored Resistance  - 1 x daily - 7 x weekly - 3 sets - 10 reps - Theracane Over Shoulder  - 1 x daily - 7 x weekly - 3 sets - 10 reps  ASSESSMENT:  CLINICAL IMPRESSION: Held cupping today due to c/o increased soreness and bruising after last visit. Pt significantly tighter into R sub occipitals compared to L as well as R scalenes. Great response to manual interventions this session,citing improved mobility at end of session. PT to discuss DN with pt next session. Educated on heat use and positional awareness. Will continue to progress as tolerated.   Eval: Patient is a 68 year old female with 1 year history of cervical pain and tightness located on the right side.  She has limited cervical mobility.  She has significant spasming in her upper trap and into her cervical spine.   Her MRI RI shows multilevel degeneration.  She would benefit from skilled therapy to improve cervical mobility, decreased overall cervical compression, and increased postural stability.   OBJECTIVE IMPAIRMENTS: decreased ROM, hypomobility, increased fascial restrictions, postural dysfunction, and pain.   ACTIVITY LIMITATIONS: lifting, reach over head, and turning head while driving   PARTICIPATION LIMITATIONS: driving and shopping  PERSONAL FACTORS: 1 comorbidity: past history of Leukemia  are also affecting patient's functional outcome.   REHAB POTENTIAL: Excellent  CLINICAL DECISION MAKING: Stable/uncomplicated  EVALUATION COMPLEXITY: Low   GOALS: Goals reviewed with patient? Yes  SHORT TERM GOALS: Target date:   Patient will increase cervical rotation by 5 degrees bilateral Baseline:  Goal status: INITIAL  2.  Patient will demonstrate 5 out of 5 gross right upper extremity strength  Baseline:  Goal status: INITIAL  3.  Patient will be independent with basic HEP Baseline:  Goal status: INITIAL    LONG TERM GOALS: Target date: 08/13/2023    Patient will report no pain and stiffness when she wakes up in the morning Baseline:  Goal status: INITIAL  2.  Patient will be independent with complete exercise program Baseline:  Goal status: INITIAL  3.  Patient report no spasming or tightness with daily tasks Baseline:  Goal status: INITIAL    PLAN:  PT FREQUENCY: 1-2x/week  PT DURATION: 8 weeks  PLANNED INTERVENTIONS:  97110-Therapeutic exercises, 97530- Therapeutic activity, [id number]- Neuromuscular re-education, 97535- Self Care, [id number]- Manual therapy, , [id number]- Aquatic Therapy, 97014- Electrical stimulation (unattended), 97035- Ultrasound, Patient/Family education, Taping, Dry Needling, , Cryotherapy, and Moist heat   PLAN FOR NEXT SESSION: discuss trigger point dry needling. Consider upper trap inhibition taping.  Patient is a member of the gym.  Continue review  gym exercises.  Consider seated band postural stability series.  Reviewed stretches and exercises from HEP.  Continue with manual therapy.  Patient has a large spasm in the right side of her cervical spine.   Fronie Jewett Terisa Belardo, PTA 07/02/2023, 2:22 PM  [employer] Medicare Authorization form:  Date 06/17/2023 See Hoy Mackintosh for onset date, symptom description, and how symptoms began.   Average pain intensity:      Last 24 hours: 0      Past week: 3 How often have your symptoms interfered with your usual daily activities? 5 How is your condition changing, since care began at this facility? 5 In general, would you say your overall health right now is... 5

## 2023-07-07 ENCOUNTER — Encounter (HOSPITAL_BASED_OUTPATIENT_CLINIC_OR_DEPARTMENT_OTHER): Payer: Self-pay | Admitting: Physical Therapy

## 2023-07-07 ENCOUNTER — Ambulatory Visit (HOSPITAL_BASED_OUTPATIENT_CLINIC_OR_DEPARTMENT_OTHER): Payer: Self-pay | Admitting: Physical Therapy

## 2023-07-07 ENCOUNTER — Encounter (HOSPITAL_BASED_OUTPATIENT_CLINIC_OR_DEPARTMENT_OTHER): Admitting: Rehabilitative and Restorative Service Providers"

## 2023-07-07 DIAGNOSIS — M542 Cervicalgia: Secondary | ICD-10-CM

## 2023-07-07 DIAGNOSIS — M62838 Other muscle spasm: Secondary | ICD-10-CM | POA: Diagnosis not present

## 2023-07-07 NOTE — Therapy (Signed)
 OUTPATIENT PHYSICAL THERAPY CERVICAL TREATMENT   Patient Name: Barbara Benitez MRN: [id number] DOB:[birth year], 68 y.o., female Today's Date: 07/07/2023  END OF SESSION:  PT End of Session - 07/07/23 1404     Visit Number 5    Number of Visits 16    Date for PT Re-Evaluation 08/12/23    PT Start Time 1400    PT Stop Time 1443    PT Time Calculation (min) 43 min    Activity Tolerance Patient tolerated treatment well    Behavior During Therapy [facility] for tasks assessed/performed              Past Medical History:  Diagnosis Date   Allergy    AML (acute myeloblastic leukemia) (HCC) 2014, 2018   Asthma    Blood transfusion without reported diagnosis    Cancer (HCC)    Complication of anesthesia    Leukemia (HCC)    PONV (postoperative nausea and vomiting)    Past Surgical History:  Procedure Laterality Date   KNEE ARTHROSCOPY WITH PATELLAR TENDON REPAIR Left 12/20/2021   Procedure: LEFT KNEE EXCISION OF PATELLAR FRACTURE FRAGMENT AND KNEE  PATELLAR TENDON REPAIR;  Surgeon: Dozier Soulier, MD;  Location: [facility];  Service: Orthopedics;  Laterality: Left;   PATELLAR TENDON REPAIR Left 12/20/2021   Procedure: PATELLAR TENDON REPAIR;  Surgeon: Dozier Soulier, MD;  Location: [facility];  Service: Orthopedics;  Laterality: Left;   tendon Left    Hand   TENDON REPAIR Left 2007   hand   Patient Active Problem List   Diagnosis Date Noted   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   AML (acute myeloblastic leukemia) (HCC) 06/24/2016   Acute myeloid leukemia (HCC) 01/09/2013    PCP: [facility]   REFERRING PROVIDER: Juliane Budge   REFERRING DIAG: Cervicalgia  THERAPY DIAG:  Cervicalgia  Other muscle spasm  Rationale for Evaluation and Treatment: Rehabilitation  ONSET DATE: 1 year prior  SUBJECTIVE:                                                                                                                                                                                                          SUBJECTIVE STATEMENT:  The patient reports over the past few days her symptoms have improved.   Eval: Patient had a insidious onset of neck stiffness and pain approximately 1 year ago.  She had a episode of physical therapy which helped but did not resolve the pain.  She continues to feel significant tightness in her neck.  Send as painful as  it once was but it still has continued tightness.  She feels like the tightness comes and goes.  She cannot pinpoint a particular action that causes increased tightness.   Hand dominance: Right  PERTINENT HISTORY:  Asthma, leukemia ( full remission)  patella tendon repair   PAIN:  Are you having pain? Yes: NPRS scale: 0/10 right now 3/10 at worst  Pain location: more on right but both sides  Pain description: aching  Aggravating factors: just comes and goes  Relieving factors: soft tissue mobilization   PRECAUTIONS: None  RED FLAGS: None     WEIGHT BEARING RESTRICTIONS: No  FALLS:  Has patient fallen in last 6 months? No  LIVING ENVIRONMENT: N/A OCCUPATION:  Retired  Social research officer, government     PLOF: Independent  PATIENT GOALS:  To have less pain   NEXT MD VISIT:  Nothing   OBJECTIVE:  Note: Objective measures were completed at Evaluation unless otherwise noted.  DIAGNOSTIC FINDINGS:   Cervical MRI:  IMPRESSION: Multilevel cervical spondylosis, worst at C3-4 where there is moderate right neural foraminal narrowing and at C4-5 where there is moderate left neural foraminal narrowing.  PATIENT SURVEYS:  NDI 24  COGNITION: Overall cognitive status: Within functional limits for tasks assessed  SENSATION: WFL  POSTURE: forward head  PALPATION: Significant spasming in upper trap and cervical paraspinals   CERVICAL ROM:   Active ROM A/PROM (deg) eval   Flexion No pain    Extension Mild pain    Right lateral flexion    Left lateral flexion    Right rotation 58  62  Left rotation 60 70   (Blank rows = not tested)  UPPER EXTREMITY ROM:  Active ROM Right eval Left eval  Shoulder flexion Within normal limits   Shoulder extension    Shoulder abduction    Shoulder adduction    Shoulder extension    Shoulder internal rotation Within normal limits   Shoulder external rotation Within normal limits   Elbow flexion    Elbow extension    Wrist flexion    Wrist extension    Wrist ulnar deviation    Wrist radial deviation    Wrist pronation    Wrist supination     (Blank rows = not tested)  UPPER EXTREMITY MMT:  MMT Right eval Left eval  Shoulder flexion 4+   Shoulder extension    Shoulder abduction    Shoulder adduction    Shoulder extension    Shoulder internal rotation 4+   Shoulder external rotation 4+   Middle trapezius    Lower trapezius    Elbow flexion    Elbow extension    Wrist flexion    Wrist extension    Wrist ulnar deviation    Wrist radial deviation    Wrist pronation    Wrist supination    Grip strength     (Blank rows = not tested)  TREATMENT DATE:    6/23 There-ex: ube 2 min back 1 min fwd   Manual: Trigger point release to upper trap, cervical paraspinals  UPA to C2-C3 in supine  IASTYM to peri-scapular area and levator  Reviewed of self soft tissue mobilization   There-ex:  Pec stretch 3x20 sec hold in doorway   Nuero-re-ed  Cabe Row:  X10 15 lbs RPE  2x10 20 lbs RPE 4  Shoulder extension X10 15 lbs  Could feel in upper traps so weight moved down   X12 10lbs  X15 10 lbs  07/02/23: STM to bil UT, cervical ps, sub occipitals, levator scap, scalenes UT stretch 30sec x2ea Cervical flexion stretch with hands laced behind head 20sec x3 Sidelying open book pec stretch Bil ER RTB 2x10 with cues Supine horizontal abduction RTB 2x10 with cues Posterior shoulder rolls   06/24/23: STM to bil UT, cervical ps, levator scap Static cupping to bil Levator scap with and without repeated  cervical sidebending UT stretch Sidelying open book pec stretch Bil ER RTB x15 with cues Supine horizontal abduction RTB x15 with cues Posterior shoulder rolls       06/20/23: STM to bil UT, cervical ps, sub occipitals UT stretch Cervical flexion stretch with hands behind head SCM stretch (did not feel effective) Pec stretch doorway Quadruped thoracic rotation (hand behind head) Sidelying open book pec stretch HEP update  Eval: Manual:  Trigger point release to cervical spine  Sub-occipital release  Genetle cervical stration  C2-c3 UPA  Self trigger point release     There-ex: Upper trap stretch 3x20 sec hold  Levator stretch 3x20 sec hold   Review of HEP                                                                                                                PATIENT EDUCATION:  Education details: HEP, symptom management Person educated: Patient Education method: Explanation, Demonstration, Tactile cues, Verbal cues, and Handouts Education comprehension: verbalized understanding, returned demonstration, verbal cues required, tactile cues required, and needs further education  HOME EXERCISE PROGRAM: Access Code: [email or web address] URL: [email or web address] Date: 06/18/2023 Prepared by: Alm Don  Exercises - Gentle Levator Scapulae Stretch  - 2-3 x daily - 7 x weekly - 3 sets - 10 reps - Seated Upper Trapezius Stretch  - 2-3 x daily - 7 x weekly - 3 sets - 10 reps - Shoulder extension with resistance - Neutral  - 1 x daily - 7 x weekly - 3 sets - 10 reps - Standing Bilateral Low Shoulder Row with Anchored Resistance  - 1 x daily - 7 x weekly - 3 sets - 10 reps - Theracane Over Shoulder  - 1 x daily - 7 x weekly - 3 sets - 10 reps  ASSESSMENT:  CLINICAL IMPRESSION: Therapy continues to work on soft tissue mobilization. We used IASTYM down into her shoulder blade today. We also reviewed gym exercises and how to grade these exercises. She tolerated  well. She may consider trigger point dry needling next visit. She was advised to continue with her exercises at home. We reviewed use of the thera-cane for self soft tissue mobilization as well. She may purchase one for home    Eval: Patient is a 68 year old female with 1 year history of cervical pain and tightness located on the right side.  She has limited cervical mobility.  She has significant spasming in her upper trap and into her cervical spine.  Her MRI RI shows multilevel degeneration.  She would benefit from skilled therapy to improve cervical mobility, decreased overall cervical compression, and increased postural  stability.   OBJECTIVE IMPAIRMENTS: decreased ROM, hypomobility, increased fascial restrictions, postural dysfunction, and pain.   ACTIVITY LIMITATIONS: lifting, reach over head, and turning head while driving   PARTICIPATION LIMITATIONS: driving and shopping  PERSONAL FACTORS: 1 comorbidity: past history of Leukemia  are also affecting patient's functional outcome.   REHAB POTENTIAL: Excellent  CLINICAL DECISION MAKING: Stable/uncomplicated  EVALUATION COMPLEXITY: Low   GOALS: Goals reviewed with patient? Yes  SHORT TERM GOALS: Target date:   Patient will increase cervical rotation by 5 degrees bilateral Baseline:  Goal status: INITIAL  2.  Patient will demonstrate 5 out of 5 gross right upper extremity strength Baseline:  Goal status: INITIAL  3.  Patient will be independent with basic HEP Baseline:  Goal status: INITIAL    LONG TERM GOALS: Target date: 08/13/2023    Patient will report no pain and stiffness when she wakes up in the morning Baseline:  Goal status: INITIAL  2.  Patient will be independent with complete exercise program Baseline:  Goal status: INITIAL  3.  Patient report no spasming or tightness with daily tasks Baseline:  Goal status: INITIAL    PLAN:  PT FREQUENCY: 1-2x/week  PT DURATION: 8 weeks  PLANNED  INTERVENTIONS:  97110-Therapeutic exercises, 97530- Therapeutic activity, [id number]- Neuromuscular re-education, 97535- Self Care, [id number]- Manual therapy, , [id number]- Aquatic Therapy, 97014- Electrical stimulation (unattended), 97035- Ultrasound, Patient/Family education, Taping, Dry Needling, , Cryotherapy, and Moist heat   PLAN FOR NEXT SESSION: discuss trigger point dry needling. Consider upper trap inhibition taping.  Patient is a member of the gym.  Continue review gym exercises.  Consider seated band postural stability series.  Reviewed stretches and exercises from HEP.  Continue with manual therapy.  Patient has a large spasm in the right side of her cervical spine.   Alm JINNY Don, PT 07/07/2023, 2:05 PM  [employer] Medicare Authorization form:  Date 06/17/2023 See Michaeline for onset date, symptom description, and how symptoms began.   Average pain intensity:      Last 24 hours: 0      Past week: 3 How often have your symptoms interfered with your usual daily activities? 5 How is your condition changing, since care began at this facility? 5 In general, would you say your overall health right now is... 5

## 2023-07-08 ENCOUNTER — Encounter (HOSPITAL_BASED_OUTPATIENT_CLINIC_OR_DEPARTMENT_OTHER): Payer: Self-pay | Admitting: Physical Therapy

## 2023-07-09 ENCOUNTER — Encounter (HOSPITAL_BASED_OUTPATIENT_CLINIC_OR_DEPARTMENT_OTHER): Payer: Self-pay | Admitting: Rehabilitative and Restorative Service Providers"

## 2023-07-09 ENCOUNTER — Ambulatory Visit (HOSPITAL_BASED_OUTPATIENT_CLINIC_OR_DEPARTMENT_OTHER): Admitting: Rehabilitative and Restorative Service Providers"

## 2023-07-09 DIAGNOSIS — M542 Cervicalgia: Secondary | ICD-10-CM | POA: Diagnosis not present

## 2023-07-09 DIAGNOSIS — M62838 Other muscle spasm: Secondary | ICD-10-CM | POA: Diagnosis not present

## 2023-07-09 NOTE — Therapy (Signed)
 OUTPATIENT PHYSICAL THERAPY CERVICAL TREATMENT   Patient Name: Barbara Benitez MRN: [id number] DOB:[birth year], 68 y.o., female Today's Date: 07/09/2023  END OF SESSION:  PT End of Session - 07/09/23 1311     Visit Number 6    Number of Visits 16    Date for PT Re-Evaluation 08/12/23    PT Start Time 0112    PT Stop Time 0202    PT Time Calculation (min) 50 min    Activity Tolerance Patient tolerated treatment well;No increased pain    Behavior During Therapy [facility] for tasks assessed/performed               Past Medical History:  Diagnosis Date   Allergy    AML (acute myeloblastic leukemia) (HCC) 2014, 2018   Asthma    Blood transfusion without reported diagnosis    Cancer (HCC)    Complication of anesthesia    Leukemia (HCC)    PONV (postoperative nausea and vomiting)    Past Surgical History:  Procedure Laterality Date   KNEE ARTHROSCOPY WITH PATELLAR TENDON REPAIR Left 12/20/2021   Procedure: LEFT KNEE EXCISION OF PATELLAR FRACTURE FRAGMENT AND KNEE  PATELLAR TENDON REPAIR;  Surgeon: Dozier Soulier, MD;  Location: [facility];  Service: Orthopedics;  Laterality: Left;   PATELLAR TENDON REPAIR Left 12/20/2021   Procedure: PATELLAR TENDON REPAIR;  Surgeon: Dozier Soulier, MD;  Location: [facility];  Service: Orthopedics;  Laterality: Left;   tendon Left    Hand   TENDON REPAIR Left 2007   hand   Patient Active Problem List   Diagnosis Date Noted   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   AML (acute myeloblastic leukemia) (HCC) 06/24/2016   Acute myeloid leukemia (HCC) 01/09/2013    [facility]P: [facility]   REFERRING PROVIDER: Juliane Budge   REFERRING DIAG: Cervicalgia  THERAPY DIAG:  Cervicalgia  Other muscle spasm  Rationale for Evaluation and Treatment: Rehabilitation  ONSET DATE: 1 year prior  SUBJECTIVE:                                                                                                                                                                                                          SUBJECTIVE STATEMENT:  The patient reports over the past few days her symptoms have improved.   Eval: Patient had a insidious onset of neck stiffness and pain approximately 1 year ago.  She had a episode of physical therapy which helped but did not resolve the pain.  She continues to feel significant tightness in her neck.  Send  as painful as it once was but it still has continued tightness.  She feels like the tightness comes and goes.  She cannot pinpoint a particular action that causes increased tightness.   Hand dominance: Right  PERTINENT HISTORY:  Asthma, leukemia ( full remission)  patella tendon repair   PAIN:  Are you having pain? Yes: NPRS scale: 0/10 right now 3/10 at worst  Pain location: more on right but both sides  Pain description: aching  Aggravating factors: just comes and goes  Relieving factors: soft tissue mobilization   PRECAUTIONS: None  RED FLAGS: None     WEIGHT BEARING RESTRICTIONS: No  FALLS:  Has patient fallen in last 6 months? No  LIVING ENVIRONMENT: N/A OCCUPATION:  Retired  Social research officer, government     PLOF: Independent  PATIENT GOALS:  To have less pain   NEXT MD VISIT:  Nothing   OBJECTIVE:  Note: Objective measures were completed at Evaluation unless otherwise noted.  DIAGNOSTIC FINDINGS:   Cervical MRI:  IMPRESSION: Multilevel cervical spondylosis, worst at C3-4 where there is moderate right neural foraminal narrowing and at C4-5 where there is moderate left neural foraminal narrowing.  PATIENT SURVEYS:  NDI 24  COGNITION: Overall cognitive status: Within functional limits for tasks assessed  SENSATION: WFL  POSTURE: forward head  PALPATION: Significant spasming in upper trap and cervical paraspinals   CERVICAL ROM:   Active ROM A/PROM (deg) eval   Flexion No pain    Extension Mild pain    Right lateral flexion    Left lateral flexion     Right rotation 58 62  Left rotation 60 70   (Blank rows = not tested)  UPPER EXTREMITY ROM:  Active ROM Right eval Left eval  Shoulder flexion Within normal limits   Shoulder extension    Shoulder abduction    Shoulder adduction    Shoulder extension    Shoulder internal rotation Within normal limits   Shoulder external rotation Within normal limits   Elbow flexion    Elbow extension    Wrist flexion    Wrist extension    Wrist ulnar deviation    Wrist radial deviation    Wrist pronation    Wrist supination     (Blank rows = not tested)  UPPER EXTREMITY MMT:  MMT Right eval Left eval  Shoulder flexion 4+   Shoulder extension    Shoulder abduction    Shoulder adduction    Shoulder extension    Shoulder internal rotation 4+   Shoulder external rotation 4+   Middle trapezius    Lower trapezius    Elbow flexion    Elbow extension    Wrist flexion    Wrist extension    Wrist ulnar deviation    Wrist radial deviation    Wrist pronation    Wrist supination    Grip strength     (Blank rows = not tested)  TREATMENT DATE:  [facility] Adult PT Treatment:                                                DATE: 07/09/23 Therapeutic Exercise: UBE level 2 post only with PT verbal cues for posture and scap retraction without substitution Supine YTB chest pull x 20, hip pull x 20, bil ER x 20 Iso chin tuck with bil shoulder ext with elbows flexed supine  x 20 Supine scapular retraction/depression combination x 20 10 lb cable row 2x15 Levator stretch x 30 sec After levator stretch, pt reported R neck discomfort. PT performed manual pressure to R Levator insertion and 10 scap retract with pt reports of reduction of pain.  Manual Therapy: STW/trigger point R periscapular muscles, cervical musculature R in sitting     6/23 There-ex: ube 2 min back 1 min fwd   Manual: Trigger point release to upper trap, cervical paraspinals  UPA to C2-C3 in supine  IASTYM to peri-scapular  area and levator  Reviewed of self soft tissue mobilization   There-ex:  Pec stretch 3x20 sec hold in doorway   Nuero-re-ed  Cabe Row:  X10 15 lbs RPE  2x10 20 lbs RPE 4  Shoulder extension X10 15 lbs  Could feel in upper traps so weight moved down   X12 10lbs  X15 10 lbs      07/02/23: STM to bil UT, cervical ps, sub occipitals, levator scap, scalenes UT stretch 30sec x2ea Cervical flexion stretch with hands laced behind head 20sec x3 Sidelying open book pec stretch Bil ER RTB 2x10 with cues Supine horizontal abduction RTB 2x10 with cues Posterior shoulder rolls   06/24/23: STM to bil UT, cervical ps, levator scap Static cupping to bil Levator scap with and without repeated cervical sidebending UT stretch Sidelying open book pec stretch Bil ER RTB x15 with cues Supine horizontal abduction RTB x15 with cues Posterior shoulder rolls       06/20/23: STM to bil UT, cervical ps, sub occipitals UT stretch Cervical flexion stretch with hands behind head SCM stretch (did not feel effective) Pec stretch doorway Quadruped thoracic rotation (hand behind head) Sidelying open book pec stretch HEP update  Eval: Manual:  Trigger point release to cervical spine  Sub-occipital release  Genetle cervical stration  C2-c3 UPA  Self trigger point release     There-ex: Upper trap stretch 3x20 sec hold  Levator stretch 3x20 sec hold   Review of HEP                                                                                                                PATIENT EDUCATION:  Education details: HEP, symptom management Person educated: Patient Education method: Explanation, Demonstration, Tactile cues, Verbal cues, and Handouts Education comprehension: verbalized understanding, returned demonstration, verbal cues required, tactile cues required, and needs further education  HOME EXERCISE PROGRAM: Access Code: [email or web address] URL: [email or web address] Date:  06/18/2023 Prepared by: Alm Don  Exercises - Gentle Levator Scapulae Stretch  - 2-3 x daily - 7 x weekly - 3 sets - 10 reps - Seated Upper Trapezius Stretch  - 2-3 x daily - 7 x weekly - 3 sets - 10 reps - Shoulder extension with resistance - Neutral  - 1 x daily - 7 x weekly - 3 sets - 10 reps - Standing Bilateral Low Shoulder Row with Anchored Resistance  - 1 x daily - 7 x weekly - 3 sets - 10 reps - Theracane  Over Shoulder  - 1 x daily - 7 x weekly - 3 sets - 10 reps  ASSESSMENT:  CLINICAL IMPRESSION: Therapy continues to work on soft tissue mobilization. Pt continues to have tightness R cervical and periscapular muscles which responds well to manual and therex. Therapy also focused on postural strengthening. Pt would continue to benefit from PT for cervical therex and manual therapy to address neck pain. Pt responded well to all scap retraction for pain reduction  Eval: Patient is a 68 year old female with 1 year history of cervical pain and tightness located on the right side.  She has limited cervical mobility.  She has significant spasming in her upper trap and into her cervical spine.  Her MRI RI shows multilevel degeneration.  She would benefit from skilled therapy to improve cervical mobility, decreased overall cervical compression, and increased postural stability.   OBJECTIVE IMPAIRMENTS: decreased ROM, hypomobility, increased fascial restrictions, postural dysfunction, and pain.   ACTIVITY LIMITATIONS: lifting, reach over head, and turning head while driving   PARTICIPATION LIMITATIONS: driving and shopping  PERSONAL FACTORS: 1 comorbidity: past history of Leukemia  are also affecting patient's functional outcome.   REHAB POTENTIAL: Excellent  CLINICAL DECISION MAKING: Stable/uncomplicated  EVALUATION COMPLEXITY: Low   GOALS: Goals reviewed with patient? Yes  SHORT TERM GOALS: Target date:   Patient will increase cervical rotation by 5 degrees  bilateral Baseline:  Goal status: INITIAL  2.  Patient will demonstrate 5 out of 5 gross right upper extremity strength Baseline:  Goal status: INITIAL  3.  Patient will be independent with basic HEP Baseline:  Goal status: INITIAL    LONG TERM GOALS: Target date: 08/13/2023    Patient will report no pain and stiffness when she wakes up in the morning Baseline:  Goal status: INITIAL  2.  Patient will be independent with complete exercise program Baseline:  Goal status: INITIAL  3.  Patient report no spasming or tightness with daily tasks Baseline:  Goal status: INITIAL    PLAN:  PT FREQUENCY: 1-2x/week  PT DURATION: 8 weeks  PLANNED INTERVENTIONS:  97110-Therapeutic exercises, 97530- Therapeutic activity, [id number]- Neuromuscular re-education, 97535- Self Care, 02859- Manual therapy, , [id number]- Aquatic Therapy, 97014- Electrical stimulation (unattended), 97035- Ultrasound, Patient/Family education, Taping, Dry Needling, , Cryotherapy, and Moist heat   PLAN FOR NEXT SESSION: discuss trigger point dry needling. Consider upper trap inhibition taping.  Patient is a member of the gym.  Continue review gym exercises.  Consider seated band postural stability series.   Continue with manual therapy.  Patient has a large spasm in the right side of her cervical spine. Continue scap retraction therex.    Alger Ada, PT 07/09/2023, 2:58 PM  [employer] Medicare Authorization form:  Date 06/17/2023 See Michaeline for onset date, symptom description, and how symptoms began.   Average pain intensity:      Last 24 hours: 0      Past week: 3 How often have your symptoms interfered with your usual daily activities? 5 How is your condition changing, since care began at this facility? 5 In general, would you say your overall health right now is... 5

## 2023-07-14 ENCOUNTER — Encounter (HOSPITAL_BASED_OUTPATIENT_CLINIC_OR_DEPARTMENT_OTHER): Payer: Self-pay

## 2023-07-14 ENCOUNTER — Ambulatory Visit (HOSPITAL_BASED_OUTPATIENT_CLINIC_OR_DEPARTMENT_OTHER)

## 2023-07-14 DIAGNOSIS — M62838 Other muscle spasm: Secondary | ICD-10-CM | POA: Diagnosis not present

## 2023-07-14 DIAGNOSIS — M542 Cervicalgia: Secondary | ICD-10-CM

## 2023-07-14 NOTE — Therapy (Signed)
 OUTPATIENT PHYSICAL THERAPY CERVICAL TREATMENT   Patient Name: Barbara Benitez MRN: [id number] DOB:08-06-1955, 68 y.o., female Today's Date: 07/14/2023  END OF SESSION:  PT End of Session - 07/14/23 1438     Visit Number 7    Number of Visits 16    Date for PT Re-Evaluation 08/12/23    PT Start Time 1434    PT Stop Time 1519    PT Time Calculation (min) 45 min    Activity Tolerance Patient tolerated treatment well    Behavior During Therapy [facility] for tasks assessed/performed                Past Medical History:  Diagnosis Date   Allergy    AML (acute myeloblastic leukemia) (HCC) 2014, 2018   Asthma    Blood transfusion without reported diagnosis    Cancer (HCC)    Complication of anesthesia    Leukemia (HCC)    PONV (postoperative nausea and vomiting)    Past Surgical History:  Procedure Laterality Date   KNEE ARTHROSCOPY WITH PATELLAR TENDON REPAIR Left 12/20/2021   Procedure: LEFT KNEE EXCISION OF PATELLAR FRACTURE FRAGMENT AND KNEE  PATELLAR TENDON REPAIR;  Surgeon: Dozier Soulier, MD;  Location: [facility];  Service: Orthopedics;  Laterality: Left;   PATELLAR TENDON REPAIR Left 12/20/2021   Procedure: PATELLAR TENDON REPAIR;  Surgeon: Dozier Soulier, MD;  Location: [facility];  Service: Orthopedics;  Laterality: Left;   tendon Left    Hand   TENDON REPAIR Left 2007   hand   Patient Active Problem List   Diagnosis Date Noted   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 She has been trying to work on squeezing her shoulder blades together.   Eval: Patient had a insidious onset of neck stiffness and pain approximately 1 year ago.  She had a episode of physical therapy which helped but did not resolve the pain.  She continues to feel significant  tightness in her neck.  Send as painful as it once was but it still has continued tightness.  She feels like the tightness comes and goes.  She cannot pinpoint a particular action that causes increased tightness.   Hand dominance: Right  PERTINENT HISTORY:  Asthma, leukemia ( full remission)  patella tendon repair   PAIN:  Are you having pain? Yes: NPRS scale: 0/10 right now 3/10 at worst  Pain location: more on right but both sides  Pain description: aching  Aggravating factors: just comes and goes  Relieving factors: soft tissue mobilization   PRECAUTIONS: None  RED FLAGS: None     WEIGHT BEARING RESTRICTIONS: No  FALLS:  Has patient fallen in last 6 months? No  LIVING ENVIRONMENT: N/A OCCUPATION:  Retired  Social research officer, government     PLOF: Independent  PATIENT GOALS:  To have less pain   NEXT MD VISIT:  Nothing   OBJECTIVE:  Note: Objective measures were completed at Evaluation unless otherwise noted.  DIAGNOSTIC FINDINGS:   Cervical MRI:  IMPRESSION: Multilevel cervical spondylosis, worst at C3-4 where there is moderate right neural foraminal narrowing and at C4-5 where there is moderate left neural foraminal narrowing.  PATIENT SURVEYS:  NDI 24  COGNITION: Overall cognitive status: Within functional limits for tasks assessed  SENSATION: WFL  POSTURE: forward head  PALPATION: Significant spasming in upper trap and cervical paraspinals   CERVICAL ROM:   Active ROM A/PROM (deg) eval   Flexion No pain    Extension Mild pain    Right lateral flexion     Left lateral flexion    Right rotation 58 62  Left rotation 60 70   (Blank rows = not tested)  UPPER EXTREMITY ROM:  Active ROM Right eval Left eval  Shoulder flexion Within normal limits   Shoulder extension    Shoulder abduction    Shoulder adduction    Shoulder extension    Shoulder internal rotation Within normal limits   Shoulder external rotation Within normal limits   Elbow flexion    Elbow extension    Wrist flexion    Wrist extension    Wrist ulnar deviation    Wrist radial deviation    Wrist pronation    Wrist supination     (Blank rows = not tested)  UPPER EXTREMITY MMT:  MMT Right eval Left eval  Shoulder flexion 4+   Shoulder extension    Shoulder abduction    Shoulder adduction    Shoulder extension    Shoulder internal rotation 4+   Shoulder external rotation 4+   Middle trapezius    Lower trapezius    Elbow flexion    Elbow extension    Wrist flexion    Wrist extension    Wrist ulnar deviation    Wrist radial deviation    Wrist pronation    Wrist supination    Grip strength     (Blank rows = not tested)  TREATMENT DATE:   Treatment                            07/14/2023: Blank lines following charge title = not provided on this treatment date.   Manual:  TPDN No STM to R periscap mm and R UT There-ex: UBE 2' fwd/2'back Seated lat stretch on plinth Seated scap retraction 5 x10 Posterior capsule stretch 15sec x2 R  Nuro-Re-ed: Single row with cable 10lb 2x10 Single shoulder extension from OH at  cable 5lb 2x10ea Reverse wall push up x12 Wall angels    [facility] Adult PT Treatment:                                                DATE: 07/09/23 Therapeutic Exercise: UBE level 2 post only with PT verbal cues for posture and scap retraction without substitution Supine YTB chest pull x 20, hip pull x 20, bil ER x 20 Iso chin tuck with bil shoulder ext with elbows flexed supine x 20 Supine scapular retraction/depression combination x  20 10 lb cable row 2x15 Levator stretch x 30 sec After levator stretch, pt reported R neck discomfort. PT performed manual pressure to R Levator insertion and 10 scap retract with pt reports of reduction of pain.  Manual Therapy: STW/trigger point R periscapular muscles, cervical musculature R in sitting     6/23 There-ex: ube 2 min back 1 min fwd   Manual: Trigger point release to upper trap, cervical paraspinals  UPA to C2-C3 in supine  IASTYM to peri-scapular area and levator  Reviewed of self soft tissue mobilization   There-ex:  Pec stretch 3x20 sec hold in doorway   Nuero-re-ed  Cabe Row:  X10 15 lbs RPE  2x10 20 lbs RPE 4  Shoulder extension X10 15 lbs  Could feel in upper traps so weight moved down   X12 10lbs  X15 10 lbs       PATIENT EDUCATION:  Education details: HEP, symptom management Person educated: Patient Education method: Explanation, Demonstration, Tactile cues, Verbal cues, and Handouts Education comprehension: verbalized understanding, returned demonstration, verbal cues required, tactile cues required, and needs further education  HOME EXERCISE PROGRAM: Access Code: [email or web address] URL: [email or web address] Date: 06/18/2023 Prepared by: Alm Don  Exercises - Gentle Levator Scapulae Stretch  - 2-3 x daily - 7 x weekly - 3 sets - 10 reps - Seated Upper Trapezius Stretch  - 2-3 x daily - 7 x weekly - 3 sets - 10 reps - Shoulder extension with resistance - Neutral  - 1 x daily - 7 x weekly - 3 sets - 10 reps - Standing Bilateral Low Shoulder Row with Anchored Resistance  - 1 x daily - 7 x weekly - 3 sets - 10 reps - Theracane Over Shoulder  - 1 x daily - 7 x weekly - 3 sets - 10 reps  ASSESSMENT:  CLINICAL IMPRESSION: Improvement noted in soft tissue quality of R upper trap. She does remain severely tender within infraspinatus m, so spent time on gentle Stm to this area. Progressed with NMR and strengthening tasks with good  tolerance overall. She reported some difficulty with wall angels, but no pain. Updated HEP to include additional exercises. Will continue to progress as tolerated.   Eval: Patient is a 68 year old female with 1 year history of cervical pain and tightness located on the right side.  She has limited cervical mobility.  She has significant spasming in her upper trap and into her cervical spine.  Her MRI RI shows multilevel degeneration.  She would benefit from skilled therapy to improve cervical mobility, decreased overall cervical compression, and increased postural stability.   OBJECTIVE IMPAIRMENTS: decreased ROM, hypomobility, increased fascial restrictions, postural dysfunction, and pain.   ACTIVITY LIMITATIONS: lifting, reach over head, and turning head while driving   PARTICIPATION LIMITATIONS: driving and shopping  PERSONAL FACTORS: 1  comorbidity: past history of Leukemia  are also affecting patient's functional outcome.   REHAB POTENTIAL: Excellent  CLINICAL DECISION MAKING: Stable/uncomplicated  EVALUATION COMPLEXITY: Low   GOALS: Goals reviewed with patient? Yes  SHORT TERM GOALS: Target date:   Patient will increase cervical rotation by 5 degrees bilateral Baseline:  Goal status: INITIAL  2.  Patient will demonstrate 5 out of 5 gross right upper extremity strength Baseline:  Goal status: INITIAL  3.  Patient will be independent with basic HEP Baseline:  Goal status: INITIAL    LONG TERM GOALS: Target date: 08/13/2023    Patient will report no pain and stiffness when she wakes up in the morning Baseline:  Goal status: INITIAL  2.  Patient will be independent with complete exercise program Baseline:  Goal status: INITIAL  3.  Patient report no spasming or tightness with daily tasks Baseline:  Goal status: INITIAL    PLAN:  PT FREQUENCY: 1-2x/week  PT DURATION: 8 weeks  PLANNED INTERVENTIONS:  97110-Therapeutic exercises, 97530- Therapeutic  activity, [id number]- Neuromuscular re-education, 97535- Self Care, [id number]- Manual therapy, , [id number]- Aquatic Therapy, 97014- Electrical stimulation (unattended), 97035- Ultrasound, Patient/Family education, Taping, Dry Needling, , Cryotherapy, and Moist heat   PLAN FOR NEXT SESSION: discuss trigger point dry needling. Consider upper trap inhibition taping.  Patient is a member of the gym.  Continue review gym exercises.  Consider seated band postural stability series.   Continue with manual therapy.  Patient has a large spasm in the right side of her cervical spine. Continue scap retraction therex.    Asberry BRAVO Kyan Yurkovich, PTA 07/14/2023, 4:09 PM  [employer] Medicare Authorization form:  Date 06/17/2023 See Michaeline for onset date, symptom description, and how symptoms began.   Average pain intensity:      Last 24 hours: 0      Past week: 3 How often have your symptoms interfered with your usual daily activities? 5 How is your condition changing, since care began at this facility? 5 In general, would you say your overall health right now is... 5

## 2023-07-23 ENCOUNTER — Encounter (HOSPITAL_BASED_OUTPATIENT_CLINIC_OR_DEPARTMENT_OTHER): Payer: Self-pay | Admitting: Physical Therapy

## 2023-07-23 ENCOUNTER — Ambulatory Visit (HOSPITAL_BASED_OUTPATIENT_CLINIC_OR_DEPARTMENT_OTHER): Attending: Neurosurgery | Admitting: Physical Therapy

## 2023-07-23 DIAGNOSIS — M62838 Other muscle spasm: Secondary | ICD-10-CM | POA: Diagnosis not present

## 2023-07-23 DIAGNOSIS — M542 Cervicalgia: Secondary | ICD-10-CM | POA: Diagnosis not present

## 2023-07-23 NOTE — Therapy (Unsigned)
 OUTPATIENT PHYSICAL THERAPY CERVICAL TREATMENT   Patient Name: Barbara Benitez MRN: [id number] DOB:1955-12-27, 68 y.o., female Today's Date: 07/23/2023  END OF SESSION:  PT End of Session - 07/23/23 1611     Visit Number 8    Number of Visits 16    Date for PT Re-Evaluation 08/12/23    PT Start Time 1606    PT Stop Time 1645    PT Time Calculation (min) 39 min    Activity Tolerance Patient tolerated treatment well    Behavior During Therapy [facility] for tasks assessed/performed                Past Medical History:  Diagnosis Date   Allergy    AML (acute myeloblastic leukemia) (HCC) 2014, 2018   Asthma    Blood transfusion without reported diagnosis    Cancer (HCC)    Complication of anesthesia    Leukemia (HCC)    PONV (postoperative nausea and vomiting)    Past Surgical History:  Procedure Laterality Date   KNEE ARTHROSCOPY WITH PATELLAR TENDON REPAIR Left 12/20/2021   Procedure: LEFT KNEE EXCISION OF PATELLAR FRACTURE FRAGMENT AND KNEE  PATELLAR TENDON REPAIR;  Surgeon: Dozier Soulier, MD;  Location: [facility];  Service: Orthopedics;  Laterality: Left;   PATELLAR TENDON REPAIR Left 12/20/2021   Procedure: PATELLAR TENDON REPAIR;  Surgeon: Dozier Soulier, MD;  Location: [facility];  Service: Orthopedics;  Laterality: Left;   tendon Left    Hand   TENDON REPAIR Left 2007   hand   Patient Active Problem List   Diagnosis Date Noted   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 She has been trying to work on squeezing her shoulder blades together.   Eval: Patient had a insidious onset of neck stiffness and pain approximately 1 year ago.  She had a episode of physical therapy which helped but did not resolve the pain.  She continues to feel significant  tightness in her neck.  Send as painful as it once was but it still has continued tightness.  She feels like the tightness comes and goes.  She cannot pinpoint a particular action that causes increased tightness.   Hand dominance: Right  PERTINENT HISTORY:  Asthma, leukemia ( full remission)  patella tendon repair   PAIN:  Are you having pain? Yes: NPRS scale: 0/10 right now 3/10 at worst  Pain location: more on right but both sides  Pain description: aching  Aggravating factors: just comes and goes  Relieving factors: soft tissue mobilization   PRECAUTIONS: None  RED FLAGS: None     WEIGHT BEARING RESTRICTIONS: No  FALLS:  Has patient fallen in last 6 months? No  LIVING ENVIRONMENT: N/A OCCUPATION:  Retired  Social research officer, government     PLOF: Independent  PATIENT GOALS:  To have less pain   NEXT MD VISIT:  Nothing   OBJECTIVE:  Note: Objective measures were completed at Evaluation unless otherwise noted.  DIAGNOSTIC FINDINGS:   Cervical MRI:  IMPRESSION: Multilevel cervical spondylosis, worst at C3-4 where there is moderate right neural foraminal narrowing and at C4-5 where there is moderate left neural foraminal narrowing.  PATIENT SURVEYS:  NDI 24  7/9 NDI   14/50   COGNITION: Overall cognitive status: Within functional limits for tasks assessed  SENSATION: WFL  POSTURE: forward head  PALPATION: Significant spasming in upper trap and cervical paraspinals   CERVICAL ROM:   Active ROM A/PROM (deg) eval  7/9  Flexion No pain     Extension Mild pain      Right lateral flexion     Left lateral flexion     Right rotation 58 62 65  Left rotation 60 70 72   (Blank rows = not tested)  UPPER EXTREMITY ROM:  Active ROM Right eval Left eval  Shoulder flexion Within normal limits   Shoulder extension    Shoulder abduction    Shoulder adduction    Shoulder extension    Shoulder internal rotation Within normal limits   Shoulder external rotation Within normal limits   Elbow flexion    Elbow extension    Wrist flexion    Wrist extension    Wrist ulnar deviation    Wrist radial deviation    Wrist pronation    Wrist supination     (Blank rows = not tested)  UPPER EXTREMITY MMT:  MMT Right eval Left eval  Shoulder flexion 4+ 5  Shoulder extension    Shoulder abduction    Shoulder adduction    Shoulder extension    Shoulder internal rotation 4+ 5  Shoulder external rotation 4+ 5  Middle trapezius    Lower trapezius    Elbow flexion    Elbow extension    Wrist flexion    Wrist extension    Wrist ulnar deviation    Wrist radial deviation    Wrist pronation    Wrist supination    Grip strength     (Blank rows = not tested)  TREATMENT DATE: Trigger point release to upper trap, cervical paraspinals  UPA to C2-C3 in supine  IASTYM to peri-scapular area and levator  Reviewed of self soft tissue mobilization   Neuro-re-ed:  Row 2x15 green  [place] extension 2x15 green       Treatment                            07/14/2023: Blank lines  following charge title = not provided on this treatment date.   Manual:  TPDN No STM to R periscap mm and R UT There-ex: UBE 2' fwd/2'back Seated lat stretch on plinth Seated scap retraction 5 x10 Posterior capsule stretch 15sec x2 R  Nuro-Re-ed: Single row with cable 10lb 2x10 Single shoulder extension from OH at cable 5lb 2x10ea Reverse wall push up x12 Wall angels    [facility] Adult PT Treatment:                                                DATE: 07/09/23 Therapeutic  Exercise: UBE level 2 post only with PT verbal cues for posture and scap retraction without substitution Supine YTB chest pull x 20, hip pull x 20, bil ER x 20 Iso chin tuck with bil shoulder ext with elbows flexed supine x 20 Supine scapular retraction/depression combination x 20 10 lb cable row 2x15 Levator stretch x 30 sec After levator stretch, pt reported R neck discomfort. PT performed manual pressure to R Levator insertion and 10 scap retract with pt reports of reduction of pain.  Manual Therapy: STW/trigger point R periscapular muscles, cervical musculature R in sitting     6/23 There-ex: ube 2 min back 1 min fwd   Manual: Trigger point release to upper trap, cervical paraspinals  UPA to C2-C3 in supine  IASTYM to peri-scapular area and levator  Reviewed of self soft tissue mobilization   There-ex:  Pec stretch 3x20 sec hold in doorway   Nuero-re-ed  Cabe Row:  X10 15 lbs RPE  2x10 20 lbs RPE 4  Shoulder extension X10 15 lbs  Could feel in upper traps so weight moved down   X12 10lbs  X15 10 lbs       PATIENT EDUCATION:  Education details: HEP, symptom management Person educated: Patient Education method: Explanation, Demonstration, Tactile cues, Verbal cues, and Handouts Education comprehension: verbalized understanding, returned demonstration, verbal cues required, tactile cues required, and needs further education  HOME EXERCISE PROGRAM: Access Code: [email or web address] URL: [email or web address] Date: 06/18/2023 Prepared by: Alm Don  Exercises - Gentle Levator Scapulae Stretch  - 2-3 x daily - 7 x weekly - 3 sets - 10 reps - Seated Upper Trapezius Stretch  - 2-3 x daily - 7 x weekly - 3 sets - 10 reps - Shoulder extension with resistance - Neutral  - 1 x daily - 7 x weekly - 3 sets - 10 reps - Standing Bilateral Low Shoulder Row with Anchored Resistance  - 1 x daily - 7 x weekly - 3 sets - 10 reps - Theracane Over Shoulder  - 1 x daily -  7 x weekly - 3 sets - 10 reps  ASSESSMENT:  CLINICAL IMPRESSION: Improvement noted in soft tissue quality of R upper trap. She does remain severely tender within infraspinatus m, so spent time on gentle Stm to this area. Progressed with NMR and strengthening tasks with good tolerance overall. She reported some difficulty with wall angels, but no pain. Updated HEP to include additional exercises. Will continue to progress as tolerated.   Eval: Patient is a 68 year old female with 1 year history of cervical pain and tightness located on the right side.  She has limited cervical mobility.  She has significant spasming in her upper trap and into her cervical spine.  Her MRI RI shows multilevel  degeneration.  She would benefit from skilled therapy to improve cervical mobility, decreased overall cervical compression, and increased postural stability.   OBJECTIVE IMPAIRMENTS: decreased ROM, hypomobility, increased fascial restrictions, postural dysfunction, and pain.   ACTIVITY LIMITATIONS: lifting, reach over head, and turning head while driving   PARTICIPATION LIMITATIONS: driving and shopping  PERSONAL FACTORS: 1 comorbidity: past history of Leukemia  are also affecting patient's functional outcome.   REHAB POTENTIAL: Excellent  CLINICAL DECISION MAKING: Stable/uncomplicated  EVALUATION COMPLEXITY: Low   GOALS: Goals reviewed with patient? Yes  SHORT TERM GOALS: Target date:   Patient will increase cervical rotation by 5 degrees bilateral Baseline:  Goal status: achieved 7/9   2.  Patient will demonstrate 5 out of 5 gross right upper extremity strength Baseline:  Goal status: achieved 7/9  3.  Patient will be independent with basic HEP Baseline:  Goal status: achieved 7/9    LONG TERM GOALS: Target date: 08/13/2023    Patient will report no pain and stiffness when she wakes up in the morning Baseline:  Goal status: INITIAL  2.  Patient will be independent with complete  exercise program Baseline:  Goal status: INITIAL  3.  Patient report no spasming or tightness with daily tasks Baseline:  Goal status: INITIAL    PLAN:  PT FREQUENCY: 1-2x/week  PT DURATION: 8 weeks  PLANNED INTERVENTIONS:  97110-Therapeutic exercises, 97530- Therapeutic activity, [id number]- Neuromuscular re-education, 97535- Self Care, 02859- Manual therapy, , [id number]- Aquatic Therapy, 97014- Electrical stimulation (unattended), 97035- Ultrasound, Patient/Family education, Taping, Dry Needling, , Cryotherapy, and Moist heat   PLAN FOR NEXT SESSION: discuss trigger point dry needling. Consider upper trap inhibition taping.  Patient is a member of the gym.  Continue review gym exercises.  Consider seated band postural stability series.   Continue with manual therapy.  Patient has a large spasm in the right side of her cervical spine. Continue scap retraction therex.    Alm JINNY Don, PT 07/23/2023, 4:19 PM  [employer] Medicare Authorization form:  Date 06/17/2023 See Michaeline for onset date, symptom description, and how symptoms began.   Average pain intensity:      Last 24 hours: 0      Past week: 1-2

## 2023-07-24 ENCOUNTER — Encounter (HOSPITAL_BASED_OUTPATIENT_CLINIC_OR_DEPARTMENT_OTHER): Payer: Self-pay | Admitting: Physical Therapy

## 2023-07-25 ENCOUNTER — Ambulatory Visit (HOSPITAL_BASED_OUTPATIENT_CLINIC_OR_DEPARTMENT_OTHER)

## 2023-07-28 ENCOUNTER — Ambulatory Visit (HOSPITAL_BASED_OUTPATIENT_CLINIC_OR_DEPARTMENT_OTHER)

## 2023-07-28 ENCOUNTER — Encounter (HOSPITAL_BASED_OUTPATIENT_CLINIC_OR_DEPARTMENT_OTHER): Payer: Self-pay

## 2023-07-28 DIAGNOSIS — M62838 Other muscle spasm: Secondary | ICD-10-CM | POA: Diagnosis not present

## 2023-07-28 DIAGNOSIS — M542 Cervicalgia: Secondary | ICD-10-CM

## 2023-07-28 NOTE — Therapy (Signed)
 OUTPATIENT PHYSICAL THERAPY CERVICAL TREATMENT   Patient Name: Barbara Benitez MRN: [id number] DOB:1955/12/11, 68 y.o., female Today's Date: 07/28/2023  END OF SESSION:  PT End of Session - 07/28/23 1147     Visit Number 9    Number of Visits 16    Date for PT Re-Evaluation 09/04/23    PT Start Time 1147    PT Stop Time 1230    PT Time Calculation (min) 43 min    Activity Tolerance Patient tolerated treatment well    Behavior During Therapy [facility] for tasks assessed/performed                 Past Medical History:  Diagnosis Date   Allergy    AML (acute myeloblastic leukemia) (HCC) 2014, 2018   Asthma    Blood transfusion without reported diagnosis    Cancer (HCC)    Complication of anesthesia    Leukemia (HCC)    PONV (postoperative nausea and vomiting)    Past Surgical History:  Procedure Laterality Date   KNEE ARTHROSCOPY WITH PATELLAR TENDON REPAIR Left 12/20/2021   Procedure: LEFT KNEE EXCISION OF PATELLAR FRACTURE FRAGMENT AND KNEE  PATELLAR TENDON REPAIR;  Surgeon: Dozier Soulier, MD;  Location: [facility];  Service: Orthopedics;  Laterality: Left;   PATELLAR TENDON REPAIR Left 12/20/2021   Procedure: PATELLAR TENDON REPAIR;  Surgeon: Dozier Soulier, MD;  Location: [facility];  Service: Orthopedics;  Laterality: Left;   tendon Left    Hand   TENDON REPAIR Left 2007   hand   Patient Active Problem List   Diagnosis Date Noted   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   AML (acute myeloblastic leukemia) (HCC) 06/24/2016   Acute myeloid leukemia (HCC) 01/09/2013    PCP: [facility]   REFERRING PROVIDER: Juliane Budge   REFERRING DIAG: Cervicalgia  THERAPY DIAG:  Cervicalgia  Other muscle spasm  Rationale for Evaluation and Treatment: Rehabilitation  ONSET DATE: 1 year prior  SUBJECTIVE:                                                                                                                                                                                                          SUBJECTIVE STATEMENT:  Pt reports she has been having jaw pain on L side for last 2 weeks. Dentist thinks it's TMJ. It's been going on as long as my neck has. Dentist suggested DN for this.   Eval: Patient had a insidious onset of neck stiffness and pain approximately 1 year ago.  She had a episode of  physical therapy which helped but did not resolve the pain.  She continues to feel significant tightness in her neck.  Send as painful as it once was but it still has continued tightness.  She feels like the tightness comes and goes.  She cannot pinpoint a particular action that causes increased tightness.   Hand dominance: Right  PERTINENT HISTORY:  Asthma, leukemia ( full remission)  patella tendon repair   PAIN:  Are you having pain? Yes: NPRS scale: 0/10 right now 3/10 at worst  Pain location: more on right but both sides  Pain description: aching  Aggravating factors: just comes and goes  Relieving factors: soft tissue mobilization   PRECAUTIONS: None  RED FLAGS: None     WEIGHT BEARING RESTRICTIONS: No  FALLS:  Has patient fallen in last 6 months? No  LIVING ENVIRONMENT: N/A OCCUPATION:  Retired  Social research officer, government     PLOF: Independent  PATIENT GOALS:  To have less pain   NEXT MD VISIT:  Nothing   OBJECTIVE:  Note: Objective measures were completed at Evaluation unless otherwise noted.  DIAGNOSTIC FINDINGS:   Cervical MRI:  IMPRESSION: Multilevel cervical spondylosis, worst at C3-4 where there is moderate right neural foraminal narrowing and at C4-5 where there is moderate left neural foraminal narrowing.  PATIENT SURVEYS:  NDI 24  7/9 NDI   14/50   COGNITION: Overall cognitive status: Within functional limits for tasks assessed  SENSATION: WFL  POSTURE: forward head  PALPATION: Significant spasming in upper trap and cervical paraspinals   CERVICAL ROM:   Active ROM A/PROM  (deg) eval  7/9  Flexion No pain     Extension Mild pain     Right lateral flexion     Left lateral flexion     Right rotation 58 62 65  Left rotation 60 70 72   (Blank rows = not tested)  UPPER EXTREMITY ROM:  Active ROM Right eval Left eval  Shoulder flexion Within normal limits   Shoulder extension    Shoulder abduction    Shoulder adduction    Shoulder extension    Shoulder internal rotation Within normal limits   Shoulder external rotation Within normal limits   Elbow flexion    Elbow extension    Wrist flexion    Wrist extension    Wrist ulnar deviation    Wrist radial deviation    Wrist pronation    Wrist supination     (Blank rows = not tested)  UPPER EXTREMITY MMT:  MMT Right eval Left eval  Shoulder flexion 4+ 5  Shoulder extension    Shoulder abduction    Shoulder adduction    Shoulder extension    Shoulder internal rotation 4+ 5  Shoulder external rotation 4+ 5  Middle trapezius    Lower trapezius    Elbow flexion    Elbow extension    Wrist flexion    Wrist extension    Wrist ulnar deviation    Wrist radial deviation    Wrist pronation    Wrist supination    Grip strength     (Blank rows = not tested)  TREATMENT DATE:  Treatment                            07/28/2023: Blank lines following charge title = not provided on this treatment date.   Manual:  TPDN No STM/TPR to L periscap mm and L UT There-ex: UBE 2' fwd/2'back Seated  lat stretch on plinth Upper trap stretch/cervical stretching  Nuro-Re-ed: Flexion with YTB around hands-cues to pull apart and engage scapulae Reverse wall push up x12 Wall angels   7/10  Trigger point release to upper trap, cervical paraspinals  UPA to C2-C3 in supine  IASTYM to peri-scapular area and levator  Reviewed of self soft tissue mobilization   Neuro-re-ed:  Row 2x15 blue  [place] extension 2x15 blue  There-ex:  UBE 4 min fwd and back  Strength and ROM testing and review of goals.   Review of complete HEP      Treatment                            07/14/2023: Blank lines following charge title = not provided on this treatment date.   Manual:  TPDN No STM to R periscap mm and R UT There-ex: UBE 2' fwd/2'back Seated lat stretch on plinth Seated scap retraction 5 x10 Posterior capsule stretch 15sec x2 R  Nuro-Re-ed: Single row with cable 10lb 2x10 Single shoulder extension from OH at cable 5lb 2x10ea Reverse wall push up x12 Wall angels    [facility] Adult PT Treatment:                                                DATE: 07/09/23 Therapeutic Exercise: UBE level 2 post only with PT verbal cues for posture and scap retraction without substitution Supine YTB chest pull x 20, hip pull x 20, bil ER x 20 Iso chin tuck with bil shoulder ext with elbows flexed supine x 20 Supine scapular retraction/depression combination x 20 10 lb cable row 2x15 Levator stretch x 30 sec After levator stretch, pt reported R neck discomfort. PT performed manual pressure to R Levator insertion and 10 scap retract with pt reports of reduction of pain.  Manual Therapy: STW/trigger point R periscapular muscles, cervical musculature R in sitting    PATIENT EDUCATION:  Education details: HEP, symptom management Person educated: Patient Education method: Explanation, Demonstration, Tactile cues, Verbal cues, and Handouts Education comprehension: verbalized understanding, returned demonstration, verbal cues required, tactile cues required, and needs further education  HOME EXERCISE PROGRAM: Access Code: [email or web address] URL: [email or web address] Date: 06/18/2023 Prepared by: Alm Don  Exercises - Gentle Levator Scapulae Stretch  - 2-3 x daily - 7 x weekly - 3 sets - 10 reps - Seated Upper Trapezius Stretch  - 2-3 x daily - 7 x weekly - 3 sets - 10 reps - Shoulder extension with resistance - Neutral  - 1 x daily - 7 x weekly - 3 sets - 10 reps - Standing Bilateral Low  Shoulder Row with Anchored Resistance  - 1 x daily - 7 x weekly - 3 sets - 10 reps - Theracane Over Shoulder  - 1 x daily - 7 x weekly - 3 sets - 10 reps  ASSESSMENT:  CLINICAL IMPRESSION: Continued with trigger point release and STM focused to L levator scapula, upper trap, and infraspinatus. She remains very tender and sensitive here. Challenged by bil shoulder flexion with YTB around hands. Will have her work on this at home. Good tolerance for other NMR activities, though fatiguing.  Pt will try and obtain referral from dentist for DN for TMJ.   Eval: Patient is a 68 year old female with 1 year history of cervical  pain and tightness located on the right side.  She has limited cervical mobility.  She has significant spasming in her upper trap and into her cervical spine.  Her MRI RI shows multilevel degeneration.  She would benefit from skilled therapy to improve cervical mobility, decreased overall cervical compression, and increased postural stability.   OBJECTIVE IMPAIRMENTS: decreased ROM, hypomobility, increased fascial restrictions, postural dysfunction, and pain.   ACTIVITY LIMITATIONS: lifting, reach over head, and turning head while driving   PARTICIPATION LIMITATIONS: driving and shopping  PERSONAL FACTORS: 1 comorbidity: past history of Leukemia  are also affecting patient's functional outcome.   REHAB POTENTIAL: Excellent  CLINICAL DECISION MAKING: Stable/uncomplicated  EVALUATION COMPLEXITY: Low   GOALS: Goals reviewed with patient? Yes  SHORT TERM GOALS: Target date: 08/21/2023    Patient will increase cervical rotation by 5 degrees bilateral Baseline:  Goal status: achieved 7/9   2.  Patient will demonstrate 5 out of 5 gross right upper extremity strength Baseline:  Goal status: achieved 7/9  3.  Patient will be independent with basic HEP Baseline:  Goal status: achieved 7/9  4. Patient will wake up in the morning without tightness 7/9 New    LONG  TERM GOALS: Target date: 08/13/2023    Patient will report no pain and stiffness when she wakes up in the morning Baseline:  Goal status: ongoing 7/9  2.  Patient will be independent with complete exercise program Baseline:  Goal status: progressing 7/9  3.  Patient report no spasming or tightness with daily tasks Baseline:  Goal status: achieved. No pain after initially waking up 7/9    PLAN:  PT FREQUENCY: 1-2x/week  PT DURATION: 8 weeks  PLANNED INTERVENTIONS:  97110-Therapeutic exercises, 97530- Therapeutic activity, 97112- Neuromuscular re-education, 97535- Self Care, [id number]- Manual therapy, , [id number]- Aquatic Therapy, 97014- Electrical stimulation (unattended), 97035- Ultrasound, Patient/Family education, Taping, Dry Needling, , Cryotherapy, and Moist heat   PLAN FOR NEXT SESSION: discuss trigger point dry needling. Consider upper trap inhibition taping.  Patient is a member of the gym.  Continue review gym exercises.  Consider seated band postural stability series.   Continue with manual therapy.  Patient has a large spasm in the right side of her cervical spine. Continue scap retraction therex.    Barbara Benitez, PTA 07/28/2023, 2:07 PM  [employer] Medicare Authorization form:  Date 06/17/2023 See [place] for onset date, symptom description, and how symptoms began.   Average pain intensity:      Last 24 hours: 0      Past week: 1-2 See goals above.   NDI 53

## 2023-07-30 DIAGNOSIS — R6884 Jaw pain: Secondary | ICD-10-CM | POA: Diagnosis not present

## 2023-07-30 DIAGNOSIS — M542 Cervicalgia: Secondary | ICD-10-CM | POA: Diagnosis not present

## 2023-07-31 ENCOUNTER — Ambulatory Visit

## 2023-07-31 DIAGNOSIS — H2513 Age-related nuclear cataract, bilateral: Secondary | ICD-10-CM | POA: Diagnosis not present

## 2023-07-31 DIAGNOSIS — H35373 Puckering of macula, bilateral: Secondary | ICD-10-CM | POA: Diagnosis not present

## 2023-07-31 DIAGNOSIS — H5203 Hypermetropia, bilateral: Secondary | ICD-10-CM | POA: Diagnosis not present

## 2023-08-01 ENCOUNTER — Encounter (HOSPITAL_BASED_OUTPATIENT_CLINIC_OR_DEPARTMENT_OTHER): Admitting: Physical Therapy

## 2023-08-04 ENCOUNTER — Ambulatory Visit
Admission: RE | Admit: 2023-08-04 | Discharge: 2023-08-04 | Disposition: A | Discharge status: Home / Self Care | Source: Ambulatory Visit | Attending: Nurse Practitioner | Admitting: Nurse Practitioner

## 2023-08-04 DIAGNOSIS — Z0000 Encounter for general adult medical examination without abnormal findings: Secondary | ICD-10-CM

## 2023-08-04 DIAGNOSIS — Z1231 Encounter for screening mammogram for malignant neoplasm of breast: Secondary | ICD-10-CM | POA: Diagnosis not present

## 2023-08-05 ENCOUNTER — Encounter (HOSPITAL_BASED_OUTPATIENT_CLINIC_OR_DEPARTMENT_OTHER): Admitting: Physical Therapy

## 2023-08-06 DIAGNOSIS — R6884 Jaw pain: Secondary | ICD-10-CM | POA: Diagnosis not present

## 2023-08-06 DIAGNOSIS — M542 Cervicalgia: Secondary | ICD-10-CM | POA: Diagnosis not present

## 2023-08-08 ENCOUNTER — Encounter (HOSPITAL_BASED_OUTPATIENT_CLINIC_OR_DEPARTMENT_OTHER): Admitting: Physical Therapy

## 2023-08-19 ENCOUNTER — Encounter (HOSPITAL_BASED_OUTPATIENT_CLINIC_OR_DEPARTMENT_OTHER)

## 2023-08-26 ENCOUNTER — Encounter (HOSPITAL_BASED_OUTPATIENT_CLINIC_OR_DEPARTMENT_OTHER): Admitting: Physical Therapy

## 2023-09-17 DIAGNOSIS — H35373 Puckering of macula, bilateral: Secondary | ICD-10-CM | POA: Diagnosis not present

## 2023-09-23 DIAGNOSIS — H25812 Combined forms of age-related cataract, left eye: Secondary | ICD-10-CM | POA: Diagnosis not present

## 2023-09-23 DIAGNOSIS — H2512 Age-related nuclear cataract, left eye: Secondary | ICD-10-CM | POA: Diagnosis not present

## 2023-10-20 DIAGNOSIS — H35352 Cystoid macular degeneration, left eye: Secondary | ICD-10-CM | POA: Diagnosis not present

## 2023-10-20 DIAGNOSIS — H35372 Puckering of macula, left eye: Secondary | ICD-10-CM | POA: Diagnosis not present

## 2023-10-23 DIAGNOSIS — M810 Age-related osteoporosis without current pathological fracture: Secondary | ICD-10-CM | POA: Diagnosis not present

## 2023-10-30 DIAGNOSIS — M810 Age-related osteoporosis without current pathological fracture: Secondary | ICD-10-CM | POA: Diagnosis not present

## 2023-11-06 DIAGNOSIS — R252 Cramp and spasm: Secondary | ICD-10-CM | POA: Diagnosis not present

## 2023-12-01 IMAGING — MR MR BREAST BILAT WO/W CM
8 of 12 series · 33 of 48 positions shown · IV contrast (5 ML GADAVIST)
Comparison: With prior mammograms.

CLINICAL DATA: 65-year-old female with intermittent left nipple
inversion. Recent diagnostic mammogram and ultrasound performed on
11/27/2020 showed no mammographic or sonographic evidence of
malignancy.

EXAM:
BILATERAL BREAST MRI WITH AND WITHOUT CONTRAST
TECHNIQUE: Multiplanar, multisequence MR images of both breasts were obtained
prior to and following the intravenous administration of 5 ml of
Gadavist

[Series 2: t2_tirm_tra ipat (a-p) · axial · 3.0mm · 0.70mm/px · 1 of 55 slices shown]
[im 1/55]
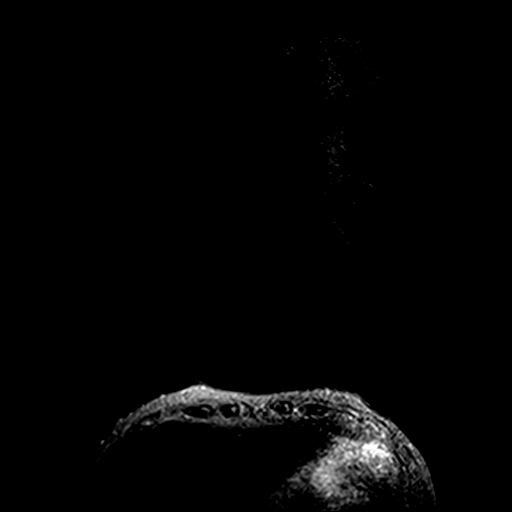

[Series 3: fl3d pre-cm no · axial · non-contrast · 1.2mm · 0.89mm/px · z∈[-75,+96]mm · 5 of 144 slices shown]
[im 1/144]
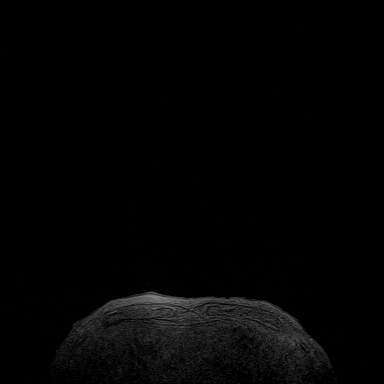
[im 36/144]
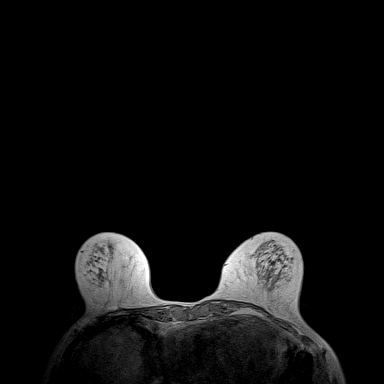
[im 72/144]
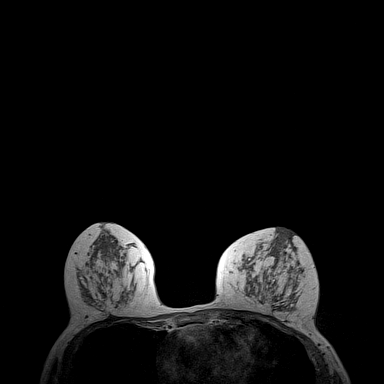
[im 108/144]
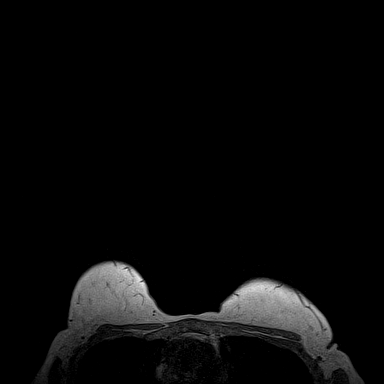
[im 144/144]
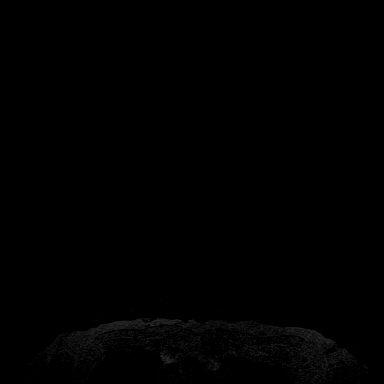

[Series 4: fl3d pre-cm · axial · non-contrast · 1.2mm · 0.89mm/px · z∈[-75,+96]mm · 5 of 144 slices shown]
[im 1/144]
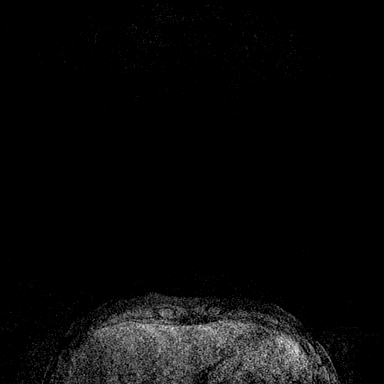
[im 36/144]
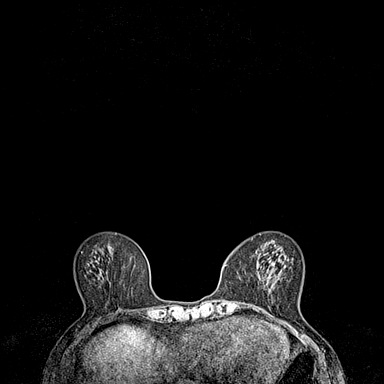
[im 72/144]
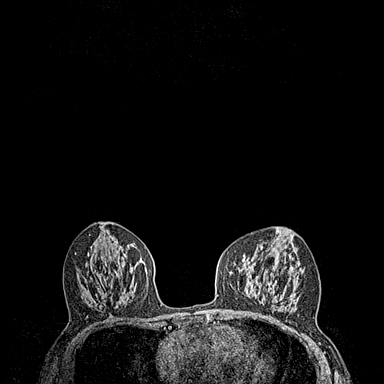
[im 108/144]
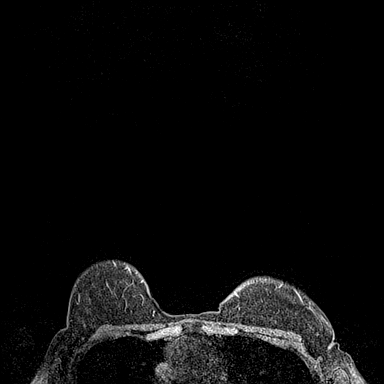
[im 144/144]
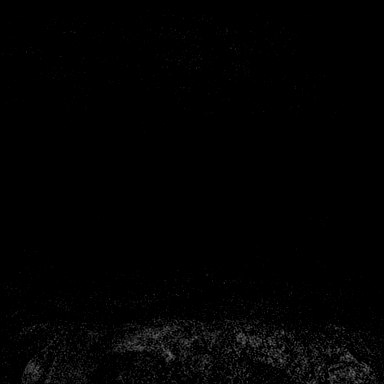

[Series 5: fl3d post-cm 20 · axial · 1.2mm · 0.89mm/px · z∈[-75,+96]mm · 5 of 144 slices shown (1 of 3)]
[im 1/144]
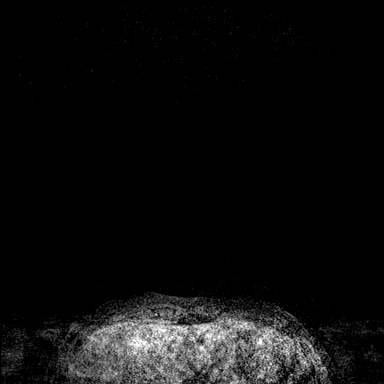
[im 36/144]
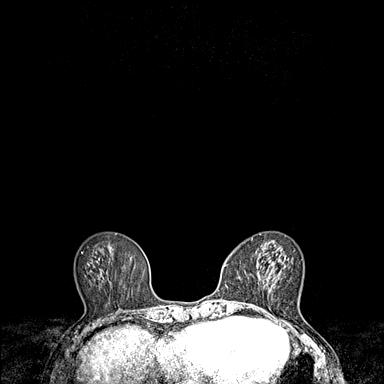
[im 72/144]
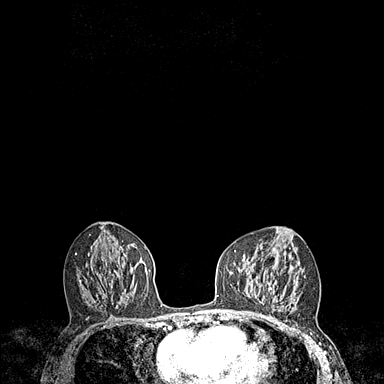
[im 108/144]
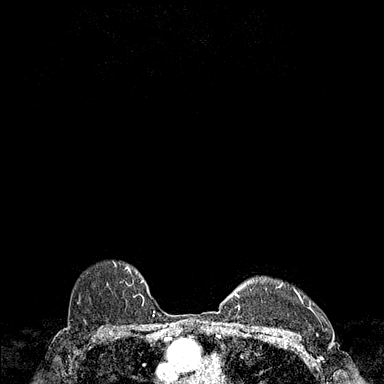
[im 144/144]
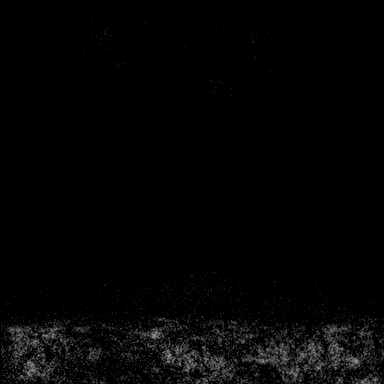

[Series 6: fl3d post-cm 20 · axial · 1.2mm · 0.89mm/px · z∈[-75,+96]mm · 5 of 144 slices shown (2 of 3)]
[im 1/144]
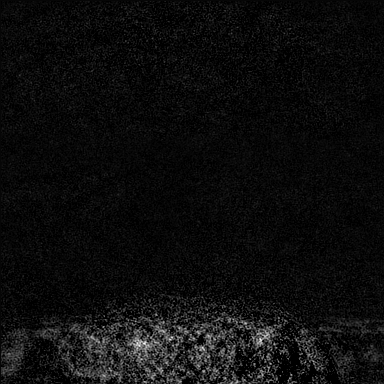
[im 36/144]
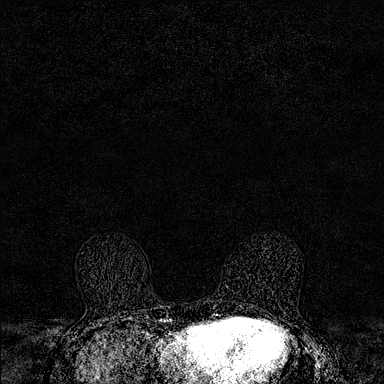
[im 72/144]
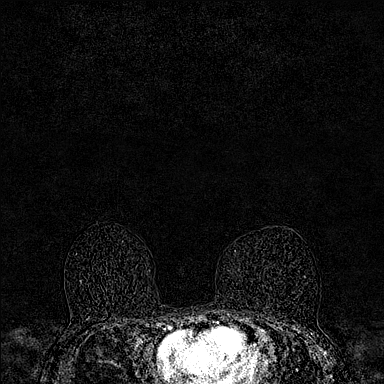
[im 108/144]
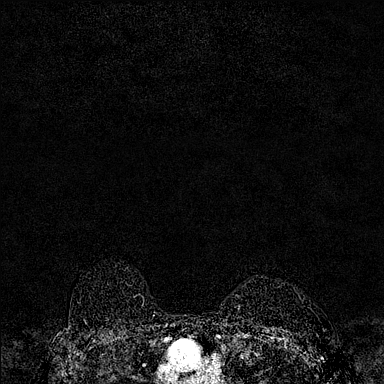
[im 144/144]
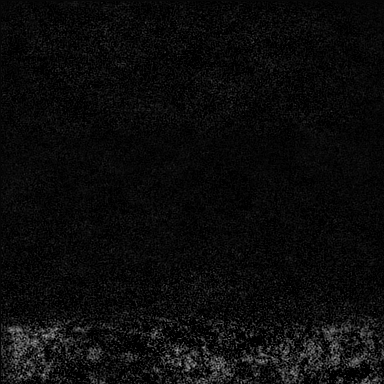

[Series 7: fl3d post-cm 20 · axial · 172.8mm · 0.89mm/px · 1 of 1 slices shown (3 of 3)]
[im 1/1]
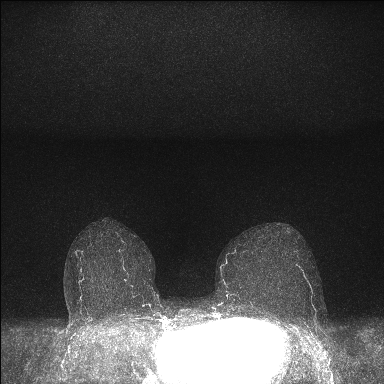

[Series 8: fl3d post-cm 3 · axial · 1.2mm · 0.89mm/px · z∈[-75,+96]mm · 6 of 144 slices shown (1 of 2)]
[im 1/144]
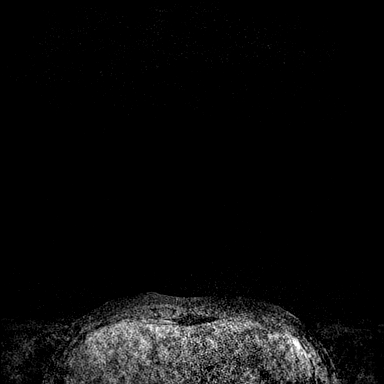
[im 29/144]
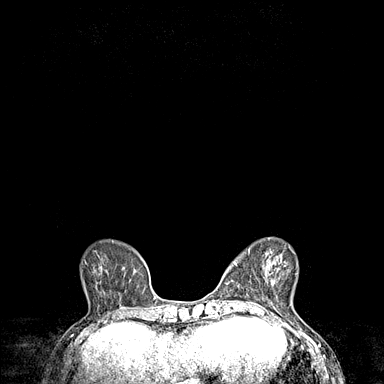
[im 58/144]
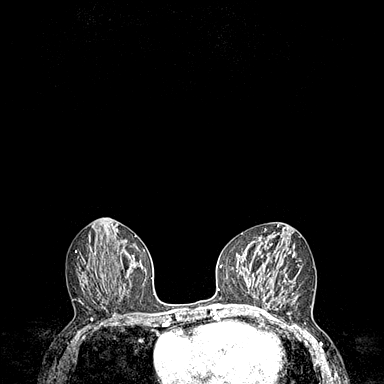
[im 86/144]
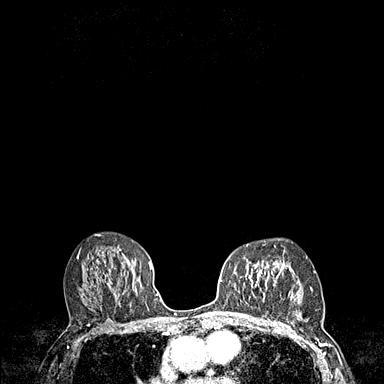
[im 115/144]
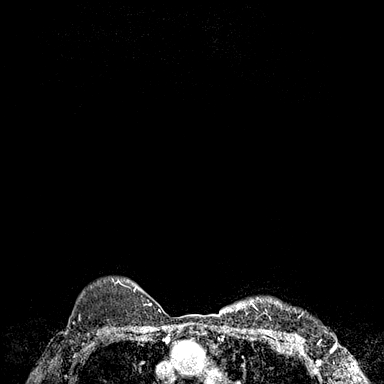
[im 144/144]
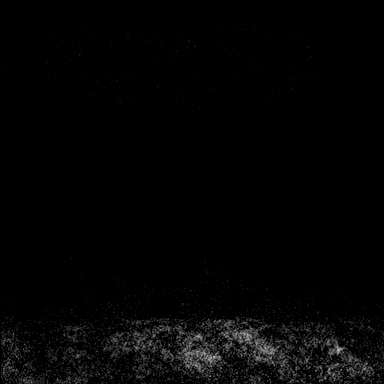

[Series 9: fl3d post-cm 3 · axial · 1.2mm · 0.89mm/px · z∈[-75,+61]mm · 5 of 144 slices shown (2 of 2)]
[im 1/144]
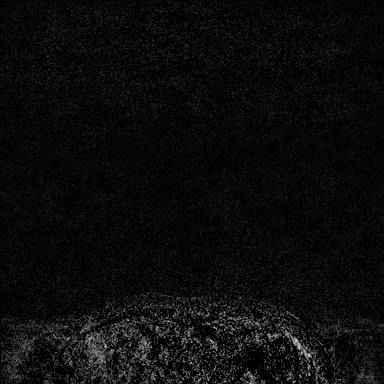
[im 29/144]
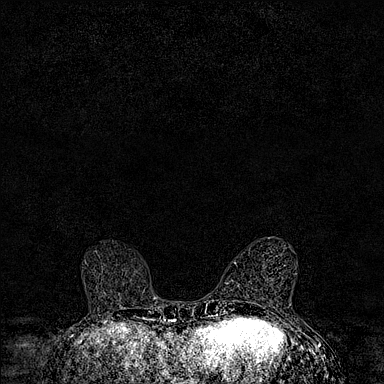
[im 58/144]
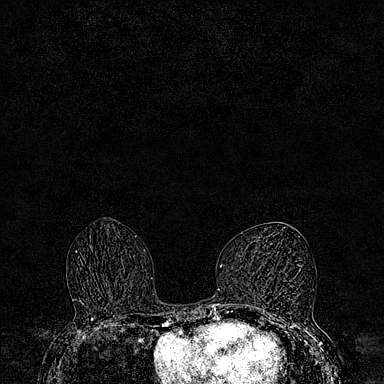
[im 86/144]
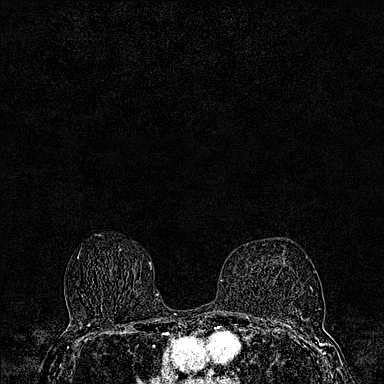
[im 115/144]
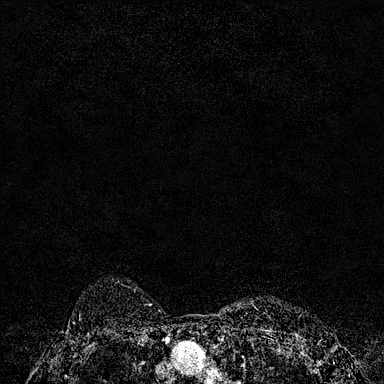

[33 of 48 positions shown; findings below may reference images not displayed]

Three-dimensional MR images were rendered by post-processing of the
original MR data on an independent workstation. The
three-dimensional MR images were interpreted, and findings are
reported in the following complete MRI report for this study. Three
dimensional images were evaluated at the independent interpreting
workstation using the DynaCAD thin client.
FINDINGS: Breast composition: c. Heterogeneous fibroglandular tissue.

Background parenchymal enhancement: Mild

Right breast: No mass or abnormal enhancement.

Left breast: No mass or abnormal enhancement.

Lymph nodes: No abnormal appearing lymph nodes.

Ancillary findings:  None.
IMPRESSION: No abnormal enhancement in either breast.

RECOMMENDATION:
Bilateral screening mammogram in Sunday November, 2021 is recommended.

If the patient notices worsening nipple inversion or a palpable mass
further imaging evaluation or surgical consultation may be
warranted.

BI-RADS CATEGORY  1: Negative.

## 2023-12-16 ENCOUNTER — Other Ambulatory Visit: Payer: Self-pay | Admitting: Physician Assistant

## 2023-12-16 DIAGNOSIS — M545 Low back pain, unspecified: Secondary | ICD-10-CM

## 2023-12-20 ENCOUNTER — Other Ambulatory Visit: Payer: Self-pay

## 2023-12-20 ENCOUNTER — Encounter (HOSPITAL_BASED_OUTPATIENT_CLINIC_OR_DEPARTMENT_OTHER): Payer: Self-pay

## 2023-12-20 ENCOUNTER — Emergency Department (HOSPITAL_BASED_OUTPATIENT_CLINIC_OR_DEPARTMENT_OTHER)

## 2023-12-20 ENCOUNTER — Emergency Department (HOSPITAL_BASED_OUTPATIENT_CLINIC_OR_DEPARTMENT_OTHER)
Admission: EM | Admit: 2023-12-20 | Discharge: 2023-12-20 | Disposition: A | Discharge status: Home / Self Care | Attending: Emergency Medicine | Admitting: Emergency Medicine

## 2023-12-20 DIAGNOSIS — M62838 Other muscle spasm: Secondary | ICD-10-CM | POA: Insufficient documentation

## 2023-12-20 DIAGNOSIS — M545 Low back pain, unspecified: Secondary | ICD-10-CM | POA: Insufficient documentation

## 2023-12-20 DIAGNOSIS — Z91040 Latex allergy status: Secondary | ICD-10-CM | POA: Insufficient documentation

## 2023-12-20 DIAGNOSIS — R42 Dizziness and giddiness: Secondary | ICD-10-CM | POA: Insufficient documentation

## 2023-12-20 LAB — COMPREHENSIVE METABOLIC PANEL WITH GFR
ALT: 32 U/L (ref 0–44)
AST: 31 U/L (ref 15–41)
Albumin: 4.3 g/dL (ref 3.5–5.0)
Alkaline Phosphatase: 68 U/L (ref 38–126)
Anion gap: 9 (ref 5–15)
BUN: 17 mg/dL (ref 8–23)
CO2: 27 mmol/L (ref 22–32)
Calcium: 10.5 mg/dL — ABNORMAL HIGH (ref 8.9–10.3)
Chloride: 105 mmol/L (ref 98–111)
Creatinine, Ser: 0.76 mg/dL (ref 0.44–1.00)
GFR, Estimated: >60 mL/min (ref >60)
Glucose, Bld: 112 mg/dL — ABNORMAL HIGH (ref 70–99)
Potassium: 4 mmol/L (ref 3.5–5.1)
Sodium: 141 mmol/L (ref 135–145)
Total Bilirubin: 0.2 mg/dL (ref 0.0–1.2)
Total Protein: 6.6 g/dL (ref 6.5–8.1)

## 2023-12-20 LAB — CBC WITH DIFFERENTIAL/PLATELET
Abs Immature Granulocytes: 0.01 K/uL (ref 0.00–0.07)
Basophils Absolute: 0 K/uL (ref 0.0–0.1)
Basophils Relative: 0 %
Eosinophils Absolute: 0.1 K/uL (ref 0.0–0.5)
Eosinophils Relative: 2 %
HCT: 36.5 % (ref 36.0–46.0)
Hemoglobin: 12.1 g/dL (ref 12.0–15.0)
Immature Granulocytes: 0 %
Lymphocytes Relative: 28 %
Lymphs Abs: 1.5 K/uL (ref 0.7–4.0)
MCH: 30.6 pg (ref 26.0–34.0)
MCHC: 33.2 g/dL (ref 30.0–36.0)
MCV: 92.2 fL (ref 80.0–100.0)
Monocytes Absolute: 0.5 K/uL (ref 0.1–1.0)
Monocytes Relative: 9 %
Neutro Abs: 3.3 K/uL (ref 1.7–7.7)
Neutrophils Relative %: 61 %
Platelets: 219 K/uL (ref 150–400)
RBC: 3.96 MIL/uL (ref 3.87–5.11)
RDW: 13.2 % (ref 11.5–15.5)
WBC: 5.4 K/uL (ref 4.0–10.5)
nRBC: 0 % (ref 0.0–0.2)

## 2023-12-20 LAB — CBG MONITORING, ED: Glucose-Capillary: 133 mg/dL — ABNORMAL HIGH (ref 70–99)

## 2023-12-20 LAB — URINALYSIS, ROUTINE W REFLEX MICROSCOPIC
Bilirubin Urine: NEGATIVE
Glucose, UA: NEGATIVE mg/dL
Hgb urine dipstick: NEGATIVE
Ketones, ur: NEGATIVE mg/dL
Leukocytes,Ua: NEGATIVE
Nitrite: NEGATIVE
Protein, ur: NEGATIVE mg/dL
Specific Gravity, Urine: 1.006 (ref 1.005–1.030)
pH: 7 (ref 5.0–8.0)

## 2023-12-20 MED ORDER — METHYLPREDNISOLONE 4 MG PO TBPK: ORAL_TABLET | ORAL | 0 refills | Status: AC

## 2023-12-20 MED ORDER — KETOROLAC TROMETHAMINE 15 MG/ML IJ SOLN
15.0000 mg | Freq: Once | INTRAMUSCULAR | Status: AC
  Administered 2023-12-20: 15 mg via INTRAVENOUS
  Filled 2023-12-20: qty 1

## 2023-12-20 MED ORDER — DEXAMETHASONE SOD PHOSPHATE PF 10 MG/ML IJ SOLN
10.0000 mg | Freq: Once | INTRAMUSCULAR | Status: AC
  Administered 2023-12-20: 10 mg via INTRAVENOUS

## 2023-12-20 MED ORDER — SODIUM CHLORIDE 0.9 % IV BOLUS
1000.0000 mL | Freq: Once | INTRAVENOUS | Status: AC
  Administered 2023-12-20: 1000 mL via INTRAVENOUS

## 2023-12-20 MED ORDER — DIAZEPAM 5 MG/ML IJ SOLN
2.5000 mg | Freq: Once | INTRAMUSCULAR | Status: AC
  Administered 2023-12-20: 2.5 mg via INTRAVENOUS
  Filled 2023-12-20: qty 2

## 2023-12-20 MED ORDER — CYCLOBENZAPRINE HCL 10 MG PO TABS: 10.0000 mg | ORAL_TABLET | Freq: Two times a day (BID) | ORAL | 0 refills | Status: AC | PRN

## 2023-12-20 MED ORDER — ACETAMINOPHEN 500 MG PO TABS
1000.0000 mg | ORAL_TABLET | Freq: Once | ORAL | Status: AC
  Administered 2023-12-20: 1000 mg via ORAL
  Filled 2023-12-20: qty 2

## 2023-12-20 NOTE — ED Provider Notes (Signed)
 [place] [facility] AT [facility] Provider Note   CSN: [id number] Arrival date & time: 12/20/23  1450     Patient presents with: Fatigue and Leg Pain   Barbara Benitez is a 68 y.o. female.   Patient here with increased pain in her back going down her left leg with spasms and tingling at times.  Been on and off the last couple months.  She is little bit dizzy this morning as well.  She denies any loss of bowel or bladder.  She denies any weakness.  She has been able to ambulate.  Symptoms got worse while shopping today.  She denies any pain with urination.  She denies any fevers chills flulike symptoms.  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  She has been treated for this.  She denies any nausea vomit abdominal pain.  She is not having any headache or neck pain.  No weakness or tingling in her upper extremities.  The history is provided by the patient.       Prior to Admission medications   Medication Sig Start Date End Date Taking? Authorizing Provider  cyclobenzaprine  (FLEXERIL ) 10 MG tablet Take 1 tablet (10 mg total) by mouth 2 (two) times daily as needed for muscle spasms. 12/20/23  Yes Darcel Zick, DO  methylPREDNISolone  (MEDROL  DOSEPAK) 4 MG TBPK tablet Follow package insert 12/20/23  Yes Coren Sagan, DO  acetaminophen  (TYLENOL ) 500 MG tablet Take 500 mg by mouth every 4 (four) hours as needed for moderate pain.    [provider]  albuterol (VENTOLIN HFA) 108 (90 Base) MCG/ACT inhaler Inhale 1-2 puffs into the lungs as needed for wheezing or shortness of breath. 12/29/17   [provider]  Biotin 1000 MCG tablet Take 1,000 mcg by mouth daily.    [provider]  Fexofenadine HCl (ALLEGRA PO) Take 180 mg by mouth daily.    [provider]  fluticasone (FLONASE) 50 MCG/ACT nasal spray Place 1 spray into both nostrils daily as needed for allergies.    [provider]  mesalamine (CANASA) 1000 MG suppository Place  1,000 mg rectally as needed (for flare ups). 10/08/21   [provider]  Omega 3 1200 MG CAPS Take 1,200 mg by mouth daily.    [provider]  ondansetron  (ZOFRAN -ODT) 8 MG disintegrating tablet Take 1 tablet (8 mg total) by mouth every 8 (eight) hours as needed for nausea or vomiting. 12/16/21   Dasie Faden, MD  promethazine  (PHENERGAN ) 12.5 MG tablet Take 1-2 tablets (12.5-25 mg total) by mouth every 6 (six) hours as needed for nausea or vomiting. 12/20/21   Porterfield, Amber, PA-C  VITAMIN D PO Take 2,000 Units by mouth daily. Vitamin d 2000 unit drops    [provider]  vitamin k 100 MCG tablet Take 100 mcg by mouth daily.    [provider]  Zinc 50 MG TABS Take 50 mg by mouth daily.    [provider]    Allergies: Amoxicillin, Other, Codeine, Morphine , Percocet [oxycodone-acetaminophen ], Vicodin [hydrocodone -acetaminophen ], Latex, and Meropenem    Review of Systems  Updated Vital Signs BP (!) 174/89 (BP Location: Right Arm)   Pulse 77   Temp 98.1 F (36.7 C) (Oral)   Resp 17   Ht 5' 5 (1.651 m)   Wt 54.9 kg   SpO2 99%   BMI 20.14 kg/m   Physical Exam Vitals and nursing note reviewed.  Constitutional:      General: She is not  in acute distress.    Appearance: She is well-developed. She is not ill-appearing.  HENT:     Head: Normocephalic and atraumatic.     Nose: Nose normal.     Mouth/Throat:     Mouth: Mucous membranes are moist.  Eyes:     Extraocular Movements: Extraocular movements intact.     Conjunctiva/sclera: Conjunctivae normal.     Pupils: Pupils are equal, round, and reactive to light.  Cardiovascular:     Rate and Rhythm: Normal rate and regular rhythm.     Pulses: Normal pulses.     Heart sounds: Normal heart sounds. No murmur heard. Pulmonary:     Effort: Pulmonary effort is normal. No respiratory distress.     Breath sounds: Normal breath sounds.  Abdominal:     Palpations: Abdomen is soft.      Tenderness: There is no abdominal tenderness.  Musculoskeletal:        General: No swelling.     Cervical back: Normal range of motion and neck supple. No tenderness.  Skin:    General: Skin is warm and dry.     Capillary Refill: Capillary refill takes less than 2 seconds.  Neurological:     General: No focal deficit present.     Mental Status: She is alert and oriented to person, place, and time.     Cranial Nerves: No cranial nerve deficit.     Sensory: No sensory deficit.     Motor: No weakness.     Coordination: Coordination normal.     Comments: 5+ out of 5 strength throughout, normal sensation, no drift, normal speech, normal finger-to-nose finger, normal visual fields  Psychiatric:        Mood and Affect: Mood normal.     (all labs ordered are listed, but only abnormal results are displayed) Labs Reviewed  COMPREHENSIVE METABOLIC PANEL WITH GFR - Abnormal; Notable for the following components:      Result Value   Glucose, Bld 112 (*)    Calcium 10.5 (*)    All other components within normal limits  URINALYSIS, ROUTINE W REFLEX MICROSCOPIC - Abnormal; Notable for the following components:   Color, Urine COLORLESS (*)    All other components within normal limits  CBG MONITORING, ED - Abnormal; Notable for the following components:   Glucose-Capillary 133 (*)    All other components within normal limits  CBC WITH DIFFERENTIAL/PLATELET    EKG: None  Radiology: CT Lumbar Spine Wo Contrast Result Date: 12/20/2023 EXAM: CT OF THE LUMBAR SPINE WITHOUT CONTRAST 12/20/2023 04:23:44 PM TECHNIQUE: CT of the lumbar spine was performed without the administration of intravenous contrast. Multiplanar reformatted images are provided for review. Automated exposure control, iterative reconstruction, and/or weight based adjustment of the mA/kV was utilized to reduce the radiation dose to as low as reasonably achievable. COMPARISON: None available. CLINICAL HISTORY: Progressive left lower  extremity pain. FINDINGS: BONES AND ALIGNMENT: Normal vertebral body heights. No acute fracture or suspicious bone lesion. Normal alignment. DEGENERATIVE CHANGES: At L4-L5, a broad based disc protrusion extends into the foramina bilaterally. Mild foraminal narrowing is worse on the left. At L5-S1, a shallow central disc protrusion is present. Mild facet spurring is present bilaterally. Moderate left foraminal stenosis is present. SOFT TISSUES: No acute abnormality. IMPRESSION: 1. Broad-based disc protrusion at L4-L5 with left greater than right foraminal narrowing, correlating with left-sided symptoms. 2. Shallow central disc protrusion at L5-S1 with moderate left foraminal stenosis. Electronically signed by: Lonni Necessary MD 12/20/2023 04:43  PM EST RP Workstation: [id number]   CT Head Wo Contrast Result Date: 12/20/2023 EXAM: CT HEAD WITHOUT CONTRAST 12/20/2023 04:23:44 PM TECHNIQUE: CT of the head was performed without the administration of intravenous contrast. Automated exposure control, iterative reconstruction, and/or weight based adjustment of the mA/kV was utilized to reduce the radiation dose to as low as reasonably achievable. COMPARISON: None available. CLINICAL HISTORY: Headache, increasing frequency or severity FINDINGS: BRAIN AND VENTRICLES: No acute hemorrhage. No evidence of acute infarct. No hydrocephalus. No extra-axial collection. No mass effect or midline shift. ORBITS: No acute abnormality. SINUSES: No acute abnormality. SOFT TISSUES AND SKULL: No acute soft tissue abnormality. No skull fracture. IMPRESSION: 1. No acute intracranial abnormality. Electronically signed by: Lonni Necessary MD 12/20/2023 04:35 PM EST RP Workstation: [id number]     Procedures   Medications Ordered in the ED  acetaminophen  (TYLENOL ) tablet 1,000 mg (1,000 mg Oral Given 12/20/23 1612)  diazepam  (VALIUM ) injection 2.5 mg (2.5 mg Intravenous Given 12/20/23 1612)  sodium chloride  0.9 % bolus 1,000 mL  (1,000 mLs Intravenous New Bag/Given 12/20/23 1632)  dexamethasone  (DECADRON ) injection 10 mg (10 mg Intravenous Given 12/20/23 1656)  ketorolac  (TORADOL ) 15 MG/ML injection 15 mg (15 mg Intravenous Given 12/20/23 1655)                                    Medical Decision Making Amount and/or Complexity of Data Reviewed Labs: ordered. Radiology: ordered.  Risk OTC drugs. Prescription drug management.   Barbara Benitez is here with low back pain that is been on and off for the last couple months.  She will start physical therapy.  History of leukemia but treated for this.  She has somewhat elevated blood pressure but otherwise normal vitals.  She a little bit of dizziness while shopping today pain in the back got worse pain she down the left leg worse than normal.  Having bad spasms in the left leg.  She denies any loss of bowel or bladder.  She has no cauda equina symptoms.  She has normal neurological exam.  Differential diagnosis likely herniated disc/radicular type process in the low back.  She has unremarkable vitals.  No fever.  Doubt infectious process.  She had episode of dizziness while walking will get a head CT CT of the low back check basic labs give IV fluids IV Valium  Tylenol  and reevaluate.  I have low suspicion for any infectious process or major electrolyte abnormality or anemia.  I have no concern for stroke or cauda equina or spinal cord issue and we will hold on any MRI at this time.  Overall lab work shows no significant leukocytosis anemia or electrolyte abnormality.  Urinalysis negative for infection.  CT head is unremarkable.  Lumbar CT shows broad-based disc protrusion at L4-L5 greater on the left than the right which is consistent with her symptoms.  She does have some right-sided symptoms at times as well.  Ultimately I do think her symptoms are secondary to herniated disc.  Will put her on steroids muscle relaxant Tylenol  and have her follow-up with spine and primary care.   Overall symptoms have improved.  Head CT is unremarkable.  I think dizziness is secondary to muscle spasms and pain in her back.  Have no concern for stroke or other acute process at this time.  My hope is that steroids and medicines will help she understands return precautions.  Discharge.  This  chart was dictated using voice recognition software.  Despite best efforts to proofread,  errors can occur which can change the documentation meaning.      Final diagnoses:  Acute left-sided low back pain, unspecified whether sciatica present  Dizziness  Muscle spasm    ED Discharge Orders          Ordered    methylPREDNISolone  (MEDROL  DOSEPAK) 4 MG TBPK tablet        12/20/23 1710    cyclobenzaprine  (FLEXERIL ) 10 MG tablet  2 times daily PRN        12/20/23 1710               Orson Rho, Juliene, DO 12/20/23 1712

## 2023-12-20 NOTE — ED Triage Notes (Signed)
 Patient arrives POV with complaints of increased fatigue, leg pain (left), back pain, and dizziness for 2 months. Symptoms have worsened.

## 2023-12-20 NOTE — ED Notes (Signed)
 ED Provider at bedside.

## 2023-12-20 NOTE — Discharge Instructions (Signed)
 Continue 1000 mg of Tylenol  every 6 hours as needed for pain.  Take next dose of methylprednisolone /steroid tomorrow morning.  Use muscle relaxant cyclobenzaprine /Flexeril  as prescribed.  This medication is sedating so please be careful with its use.  Return if symptoms worsen as we discussed including urinary retention weakness in your legs loss of your bowels.  Follow-up with primary care and spine doctor.

## 2024-01-04 ENCOUNTER — Inpatient Hospital Stay
Admission: RE | Admit: 2024-01-04 | Discharge: 2024-01-04 | Attending: Physician Assistant | Admitting: Physician Assistant

## 2024-01-04 DIAGNOSIS — M545 Low back pain, unspecified: Secondary | ICD-10-CM

## 2024-04-27 ENCOUNTER — Encounter: Admitting: Vascular Surgery

## 2024-04-27 ENCOUNTER — Ambulatory Visit (HOSPITAL_COMMUNITY)
